# Patient Record
Sex: Male | Born: 1970 | Race: White | Hispanic: No | Marital: Married | Smoking: Former smoker
Health system: Southern US, Community
[De-identification: ages and names within clinical notes are randomized; demographics above are authoritative.]

## PROBLEM LIST (undated history)

## (undated) DIAGNOSIS — M1990 Unspecified osteoarthritis, unspecified site: Secondary | ICD-10-CM

## (undated) DIAGNOSIS — M542 Cervicalgia: Secondary | ICD-10-CM

## (undated) DIAGNOSIS — I10 Essential (primary) hypertension: Secondary | ICD-10-CM

## (undated) DIAGNOSIS — K219 Gastro-esophageal reflux disease without esophagitis: Secondary | ICD-10-CM

## (undated) DIAGNOSIS — E785 Hyperlipidemia, unspecified: Secondary | ICD-10-CM

## (undated) DIAGNOSIS — J449 Chronic obstructive pulmonary disease, unspecified: Secondary | ICD-10-CM

## (undated) DIAGNOSIS — E119 Type 2 diabetes mellitus without complications: Secondary | ICD-10-CM

## (undated) HISTORY — DX: Hyperlipidemia, unspecified: E78.5

## (undated) HISTORY — PX: CHOLECYSTECTOMY: SHX55

## (undated) HISTORY — DX: Type 2 diabetes mellitus without complications: E11.9

## (undated) HISTORY — PX: BACK SURGERY: SHX140

## (undated) HISTORY — PX: KNEE SURGERY: SHX244

## (undated) HISTORY — PX: SPINE SURGERY: SHX786

## (undated) HISTORY — PX: APPENDECTOMY: SHX54

## (undated) HISTORY — PX: HERNIA REPAIR: SHX51

---
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
## 1999-11-09 ENCOUNTER — Emergency Department (HOSPITAL_COMMUNITY): Admission: EM | Admit: 1999-11-09 | Discharge: 1999-11-09 | Payer: Self-pay | Admitting: Emergency Medicine

## 1999-11-09 ENCOUNTER — Encounter: Payer: Self-pay | Admitting: Emergency Medicine

## 2000-03-05 ENCOUNTER — Emergency Department (HOSPITAL_COMMUNITY): Admission: EM | Admit: 2000-03-05 | Discharge: 2000-03-06 | Payer: Self-pay | Admitting: *Deleted

## 2000-03-07 ENCOUNTER — Ambulatory Visit (HOSPITAL_COMMUNITY): Admission: RE | Admit: 2000-03-07 | Discharge: 2000-03-07 | Payer: Self-pay | Admitting: Emergency Medicine

## 2000-03-07 ENCOUNTER — Encounter: Payer: Self-pay | Admitting: Emergency Medicine

## 2000-03-08 ENCOUNTER — Emergency Department (HOSPITAL_COMMUNITY): Admission: EM | Admit: 2000-03-08 | Discharge: 2000-03-08 | Payer: Self-pay | Admitting: Emergency Medicine

## 2000-03-09 ENCOUNTER — Observation Stay (HOSPITAL_COMMUNITY): Admission: RE | Admit: 2000-03-09 | Discharge: 2000-03-10 | Payer: Self-pay | Admitting: General Surgery

## 2000-03-22 ENCOUNTER — Ambulatory Visit (HOSPITAL_COMMUNITY): Admission: RE | Admit: 2000-03-22 | Discharge: 2000-03-22 | Payer: Self-pay | Admitting: *Deleted

## 2002-01-01 ENCOUNTER — Encounter: Payer: Self-pay | Admitting: Neurosurgery

## 2002-01-01 ENCOUNTER — Observation Stay (HOSPITAL_COMMUNITY): Admission: RE | Admit: 2002-01-01 | Discharge: 2002-01-02 | Payer: Self-pay | Admitting: Neurosurgery

## 2002-04-23 ENCOUNTER — Encounter: Admission: RE | Admit: 2002-04-23 | Discharge: 2002-06-17 | Payer: Self-pay | Admitting: Neurosurgery

## 2002-07-16 ENCOUNTER — Encounter: Payer: Self-pay | Admitting: Emergency Medicine

## 2002-07-16 ENCOUNTER — Emergency Department (HOSPITAL_COMMUNITY): Admission: EM | Admit: 2002-07-16 | Discharge: 2002-07-16 | Payer: Self-pay | Admitting: Emergency Medicine

## 2002-08-13 ENCOUNTER — Encounter: Payer: Self-pay | Admitting: Neurosurgery

## 2002-08-13 ENCOUNTER — Encounter: Payer: Self-pay | Admitting: Radiology

## 2002-08-13 ENCOUNTER — Encounter: Admission: RE | Admit: 2002-08-13 | Discharge: 2002-08-13 | Payer: Self-pay | Admitting: Neurosurgery

## 2002-08-29 ENCOUNTER — Encounter: Admission: RE | Admit: 2002-08-29 | Discharge: 2002-08-29 | Payer: Self-pay | Admitting: Neurosurgery

## 2002-08-29 ENCOUNTER — Encounter: Payer: Self-pay | Admitting: Neurosurgery

## 2003-08-25 ENCOUNTER — Emergency Department (HOSPITAL_COMMUNITY): Admission: EM | Admit: 2003-08-25 | Discharge: 2003-08-25 | Payer: Self-pay | Admitting: Emergency Medicine

## 2004-04-24 ENCOUNTER — Emergency Department (HOSPITAL_COMMUNITY): Admission: EM | Admit: 2004-04-24 | Discharge: 2004-04-25 | Payer: Self-pay | Admitting: Emergency Medicine

## 2004-10-06 ENCOUNTER — Emergency Department (HOSPITAL_COMMUNITY): Admission: EM | Admit: 2004-10-06 | Discharge: 2004-10-07 | Payer: Self-pay | Admitting: Emergency Medicine

## 2005-05-01 ENCOUNTER — Emergency Department (HOSPITAL_COMMUNITY): Admission: EM | Admit: 2005-05-01 | Discharge: 2005-05-01 | Payer: Self-pay | Admitting: Emergency Medicine

## 2006-03-27 ENCOUNTER — Emergency Department (HOSPITAL_COMMUNITY): Admission: EM | Admit: 2006-03-27 | Discharge: 2006-03-27 | Payer: Self-pay | Admitting: Emergency Medicine

## 2006-10-07 ENCOUNTER — Inpatient Hospital Stay: Payer: Self-pay | Admitting: Internal Medicine

## 2006-10-07 ENCOUNTER — Other Ambulatory Visit: Payer: Self-pay

## 2006-10-14 ENCOUNTER — Other Ambulatory Visit: Payer: Self-pay

## 2006-12-24 ENCOUNTER — Inpatient Hospital Stay: Payer: Self-pay | Admitting: Internal Medicine

## 2006-12-24 ENCOUNTER — Other Ambulatory Visit: Payer: Self-pay

## 2007-01-01 ENCOUNTER — Ambulatory Visit: Payer: Self-pay | Admitting: Internal Medicine

## 2007-02-09 ENCOUNTER — Other Ambulatory Visit: Payer: Self-pay

## 2007-02-09 ENCOUNTER — Inpatient Hospital Stay: Payer: Self-pay | Admitting: Internal Medicine

## 2009-07-09 ENCOUNTER — Emergency Department: Payer: Self-pay | Admitting: Emergency Medicine

## 2012-03-30 ENCOUNTER — Ambulatory Visit: Payer: Self-pay | Admitting: Internal Medicine

## 2013-02-11 ENCOUNTER — Emergency Department: Payer: Self-pay | Admitting: Emergency Medicine

## 2013-02-11 LAB — CBC WITH DIFFERENTIAL/PLATELET
BASOS PCT: 1 %
Basophil #: 0.1 10*3/uL (ref 0.0–0.1)
EOS ABS: 0.3 10*3/uL (ref 0.0–0.7)
EOS PCT: 1.9 %
HCT: 45.2 % (ref 40.0–52.0)
HGB: 15.9 g/dL (ref 13.0–18.0)
LYMPHS PCT: 22.3 %
Lymphocyte #: 3 10*3/uL (ref 1.0–3.6)
MCH: 32 pg (ref 26.0–34.0)
MCHC: 35.2 g/dL (ref 32.0–36.0)
MCV: 91 fL (ref 80–100)
Monocyte #: 1 x10 3/mm (ref 0.2–1.0)
Monocyte %: 7.7 %
NEUTROS ABS: 9.1 10*3/uL — AB (ref 1.4–6.5)
NEUTROS PCT: 67.1 %
PLATELETS: 383 10*3/uL (ref 150–440)
RBC: 4.97 10*6/uL (ref 4.40–5.90)
RDW: 13.6 % (ref 11.5–14.5)
WBC: 13.6 10*3/uL — ABNORMAL HIGH (ref 3.8–10.6)

## 2013-02-11 LAB — COMPREHENSIVE METABOLIC PANEL
ALK PHOS: 66 U/L
AST: 46 U/L — AB (ref 15–37)
Albumin: 3.3 g/dL — ABNORMAL LOW (ref 3.4–5.0)
Anion Gap: 6 — ABNORMAL LOW (ref 7–16)
BILIRUBIN TOTAL: 0.4 mg/dL (ref 0.2–1.0)
BUN: 11 mg/dL (ref 7–18)
CALCIUM: 8.1 mg/dL — AB (ref 8.5–10.1)
CHLORIDE: 102 mmol/L (ref 98–107)
CREATININE: 0.82 mg/dL (ref 0.60–1.30)
Co2: 24 mmol/L (ref 21–32)
EGFR (Non-African Amer.): >60
GLUCOSE: 154 mg/dL — AB (ref 65–99)
Osmolality: 267 (ref 275–301)
Potassium: 3.9 mmol/L (ref 3.5–5.1)
SGPT (ALT): 51 U/L (ref 12–78)
Sodium: 132 mmol/L — ABNORMAL LOW (ref 136–145)
Total Protein: 6.9 g/dL (ref 6.4–8.2)

## 2013-02-13 LAB — BETA STREP CULTURE(ARMC)

## 2014-03-12 ENCOUNTER — Ambulatory Visit: Payer: Self-pay | Admitting: Family Medicine

## 2014-05-30 NOTE — Discharge Instructions (Signed)

## 2014-06-02 ENCOUNTER — Encounter
Admission: RE | Disposition: A | Discharge status: Home / Self Care | Payer: Self-pay | Source: Ambulatory Visit | Attending: Gastroenterology

## 2014-06-02 ENCOUNTER — Ambulatory Visit: Payer: 59 | Admitting: Anesthesiology

## 2014-06-02 ENCOUNTER — Other Ambulatory Visit: Payer: Self-pay | Admitting: Gastroenterology

## 2014-06-02 ENCOUNTER — Ambulatory Visit
Admission: RE | Admit: 2014-06-02 | Discharge: 2014-06-02 | Disposition: A | Discharge status: Home / Self Care | Payer: 59 | Source: Ambulatory Visit | Attending: Gastroenterology | Admitting: Gastroenterology

## 2014-06-02 DIAGNOSIS — M179 Osteoarthritis of knee, unspecified: Secondary | ICD-10-CM | POA: Insufficient documentation

## 2014-06-02 DIAGNOSIS — F17200 Nicotine dependence, unspecified, uncomplicated: Secondary | ICD-10-CM | POA: Insufficient documentation

## 2014-06-02 DIAGNOSIS — K219 Gastro-esophageal reflux disease without esophagitis: Secondary | ICD-10-CM | POA: Insufficient documentation

## 2014-06-02 DIAGNOSIS — Z86010 Personal history of colonic polyps: Secondary | ICD-10-CM | POA: Diagnosis present

## 2014-06-02 DIAGNOSIS — J449 Chronic obstructive pulmonary disease, unspecified: Secondary | ICD-10-CM | POA: Insufficient documentation

## 2014-06-02 DIAGNOSIS — M479 Spondylosis, unspecified: Secondary | ICD-10-CM | POA: Insufficient documentation

## 2014-06-02 DIAGNOSIS — D127 Benign neoplasm of rectosigmoid junction: Secondary | ICD-10-CM | POA: Insufficient documentation

## 2014-06-02 DIAGNOSIS — Z79899 Other long term (current) drug therapy: Secondary | ICD-10-CM | POA: Insufficient documentation

## 2014-06-02 DIAGNOSIS — Z800 Family history of malignant neoplasm of digestive organs: Secondary | ICD-10-CM | POA: Diagnosis not present

## 2014-06-02 HISTORY — DX: Gastro-esophageal reflux disease without esophagitis: K21.9

## 2014-06-02 HISTORY — PX: POLYPECTOMY: SHX149

## 2014-06-02 HISTORY — DX: Unspecified osteoarthritis, unspecified site: M19.90

## 2014-06-02 HISTORY — PX: COLONOSCOPY: SHX5424

## 2014-06-02 HISTORY — DX: Cervicalgia: M54.2

## 2014-06-02 HISTORY — DX: Chronic obstructive pulmonary disease, unspecified: J44.9

## 2014-06-02 SURGERY — COLONOSCOPY
Anesthesia: Monitor Anesthesia Care | Wound class: Contaminated

## 2014-06-02 MED ORDER — ACETAMINOPHEN 160 MG/5ML PO SOLN: 325.0000 mg | ORAL | Status: DC | PRN

## 2014-06-02 MED ORDER — ACETAMINOPHEN 325 MG PO TABS: 325.0000 mg | ORAL_TABLET | ORAL | Status: DC | PRN

## 2014-06-02 MED ORDER — LACTATED RINGERS IV SOLN
INTRAVENOUS | Status: DC
  Administered 2014-06-02 (×2): via INTRAVENOUS

## 2014-06-02 MED ORDER — PROPOFOL 10 MG/ML IV BOLUS
INTRAVENOUS | Status: DC | PRN
  Administered 2014-06-02: 20 mg via INTRAVENOUS
  Administered 2014-06-02: 30 mg via INTRAVENOUS
  Administered 2014-06-02: 170 mg via INTRAVENOUS
  Administered 2014-06-02: 20 mg via INTRAVENOUS
  Administered 2014-06-02: 30 mg via INTRAVENOUS
  Administered 2014-06-02: 20 mg via INTRAVENOUS
  Administered 2014-06-02: 30 mg via INTRAVENOUS

## 2014-06-02 MED ORDER — ONDANSETRON HCL 4 MG/2ML IJ SOLN: 4.0000 mg | Freq: Once | INTRAMUSCULAR | Status: DC | PRN

## 2014-06-02 MED ORDER — LIDOCAINE HCL (CARDIAC) 20 MG/ML IV SOLN
INTRAVENOUS | Status: DC | PRN
  Administered 2014-06-02: 50 mg via INTRAVENOUS

## 2014-06-02 MED ORDER — SIMETHICONE 40 MG/0.6ML PO SUSP
ORAL | Status: DC | PRN
  Administered 2014-06-02: 12:00:00

## 2014-06-02 SURGICAL SUPPLY — 27 items
CANISTER SUCT 1200ML W/VALVE (MISCELLANEOUS) ×4 IMPLANT
FCP ESCP3.2XJMB 240X2.8X (MISCELLANEOUS)
FORCEPS BIOP RAD 4 LRG CAP 4 (CUTTING FORCEPS) ×2 IMPLANT
FORCEPS BIOP RJ4 240 W/NDL (MISCELLANEOUS)
FORCEPS ESCP3.2XJMB 240X2.8X (MISCELLANEOUS) IMPLANT
GOWN CVR UNV OPN BCK APRN NK (MISCELLANEOUS) ×4 IMPLANT
GOWN ISOL THUMB LOOP REG UNIV (MISCELLANEOUS) ×8
HEMOCLIP INSTINCT (CLIP) IMPLANT
INJECTOR VARIJECT VIN23 (MISCELLANEOUS) IMPLANT
KIT CO2 TUBING (TUBING) ×4 IMPLANT
KIT DEFENDO VALVE AND CONN (KITS) IMPLANT
KIT ENDO PROCEDURE OLY (KITS) ×4 IMPLANT
LIGATOR MULTIBAND 6SHOOTER MBL (MISCELLANEOUS) IMPLANT
MARKER SPOT ENDO TATTOO 5ML (MISCELLANEOUS) IMPLANT
PAD GROUND ADULT SPLIT (MISCELLANEOUS) IMPLANT
SNARE SHORT THROW 13M SML OVAL (MISCELLANEOUS) IMPLANT
SNARE SHORT THROW 30M LRG OVAL (MISCELLANEOUS) IMPLANT
SPOT EX ENDOSCOPIC TATTOO (MISCELLANEOUS)
SUCTION POLY TRAP 4CHAMBER (MISCELLANEOUS) IMPLANT
TRAP SUCTION POLY (MISCELLANEOUS) IMPLANT
TUBING CONN 6MMX3.1M (TUBING)
TUBING SUCTION CONN 0.25 STRL (TUBING) IMPLANT
UNDERPAD 30X60 958B10 (PK) (MISCELLANEOUS) IMPLANT
VALVE BIOPSY ENDO (VALVE) IMPLANT
VARIJECT INJECTOR VIN23 (MISCELLANEOUS)
WATER AUXILLARY (MISCELLANEOUS) IMPLANT
WATER STERILE IRR 500ML POUR (IV SOLUTION) ×4 IMPLANT

## 2014-06-02 NOTE — Anesthesia Postprocedure Evaluation (Signed)
  Anesthesia Post-op Note  Patient: Tony Cardenas  Procedure(s) Performed: Procedure(s): COLONOSCOPY (N/A) POLYPECTOMY INTESTINAL  Anesthesia type:MAC  Patient location: PACU  Post pain: Pain level controlled  Post assessment: Post-op Vital signs reviewed, Patient's Cardiovascular Status Stable, Respiratory Function Stable, Patent Airway and No signs of Nausea or vomiting  Post vital signs: Reviewed and stable  Last Vitals:  Filed Vitals:   06/02/14 1200  BP:   Pulse:   Temp: 36.3 C  Resp:     Level of consciousness: awake, alert  and patient cooperative  Complications: No apparent anesthesia complications

## 2014-06-02 NOTE — Anesthesia Procedure Notes (Signed)
Procedure Name: MAC Performed by: Alysha Doolan Pre-anesthesia Checklist: Patient identified, Emergency Drugs available, Suction available, Patient being monitored and Timeout performed Patient Re-evaluated:Patient Re-evaluated prior to inductionOxygen Delivery Method: Nasal cannula Placement Confirmation: positive ETCO2 and breath sounds checked- equal and bilateral     

## 2014-06-02 NOTE — Anesthesia Preprocedure Evaluation (Signed)
Anesthesia Evaluation  Patient identified by MRN, date of birth, ID band Patient awake    Reviewed: Allergy & Precautions, NPO status , Patient's Chart, lab work & pertinent test results  Airway Mallampati: II  TM Distance: >3 FB Neck ROM: Full    Dental   Pulmonary COPDCurrent Smoker,    Pulmonary exam normal       Cardiovascular Normal cardiovascular exam    Neuro/Psych    GI/Hepatic GERD-  ,  Endo/Other    Renal/GU      Musculoskeletal  (+) Arthritis -,   Abdominal   Peds  Hematology   Anesthesia Other Findings   Reproductive/Obstetrics                             Anesthesia Physical Anesthesia Plan  ASA: III  Anesthesia Plan: MAC   Post-op Pain Management:    Induction:   Airway Management Planned: Nasal Cannula  Additional Equipment:   Intra-op Plan:   Post-operative Plan:   Informed Consent: I have reviewed the patients History and Physical, chart, labs and discussed the procedure including the risks, benefits and alternatives for the proposed anesthesia with the patient or authorized representative who has indicated his/her understanding and acceptance.     Plan Discussed with: CRNA  Anesthesia Plan Comments:         Anesthesia Quick Evaluation

## 2014-06-02 NOTE — H&P (Signed)
  Tony Cardenas  [address] [place],  [address] Phone: [phone] Fax : [phone]  Primary Care Physician:  Tony Guise, Tony Cardenas Primary Gastroenterologist:  Tony Cardenas  Pre-Procedure History & Physical: HPI:  Tony Cardenas is a 44 y.o. male is here for an colonoscopy.   Past Medical History  Diagnosis Date  . Neck pain 1989    BROKEN NECK IN PAST/C1-2/ [employer]  . COPD (chronic obstructive pulmonary disease)   . GERD (gastroesophageal reflux disease)   . Arthritis     NECK AND RIGHT KNEE    Past Surgical History  Procedure Laterality Date  . Neck surgery      C4-5 RUPTURED DISC  . Knee surgery Right     TORN ACL    Prior to Admission medications   Medication Sig Start Date End Date Taking? Authorizing Provider  fluticasone (FLONASE) 50 MCG/ACT nasal spray Place 2 sprays into both nostrils daily. PM   Yes Historical Provider, Tony Cardenas  meloxicam (MOBIC) 15 MG tablet Take 15 mg by mouth daily. AM   Yes Historical Provider, Tony Cardenas  omeprazole (PRILOSEC) 40 MG capsule Take 40 mg by mouth as needed. PM   Yes Historical Provider, Tony Cardenas    Allergies as of 05/19/2014  . (Not on File)    History reviewed. No pertinent family history.  History   Social History  . Marital Status: Married    Spouse Name: N/A  . Number of Children: N/A  . Years of Education: N/A   Occupational History  . Not on file.   Social History Main Topics  . Smoking status: Current Every Day Smoker -- 1.00 packs/day for 20 years    Types: Cigarettes  . Smokeless tobacco: Not on file  . Alcohol Use: No  . Drug Use: No  . Sexual Activity: Not on file   Other Topics Concern  . Not on file   Social History Narrative  . No narrative on file    Review of Systems: See HPI, otherwise negative ROS  Physical Exam: BP 135/84 mmHg  Pulse 73  Temp(Src) 97.9 F (36.6 C) (Tympanic)  Resp 16  Ht 5\' 11"  (1.803 m)  Wt 275 lb (124.739 kg)  BMI 38.37 kg/m2  SpO2  97% General:   Alert,  pleasant and cooperative in NAD Head:  Normocephalic and atraumatic. Neck:  Supple; no masses or thyromegaly. Lungs:  Clear throughout to auscultation.    Heart:  Regular rate and rhythm. Abdomen:  Soft, nontender and nondistended. Normal bowel sounds, without guarding, and without rebound.   Neurologic:  Alert and  oriented x4;  grossly normal neurologically.  Impression/Plan: Tony Cardenas is here for an colonoscopy to be performed for History of polyps and family history of colon cancer.   Risks, benefits, limitations, and alternatives regarding  colonoscopy have been reviewed with the patient.  Questions have been answered.  All parties agreeable.   [facility], Tony Cardenas  06/02/2014, 11:24 AM

## 2014-06-02 NOTE — Op Note (Signed)
[facility] Patient Name: Tony Cardenas Procedure Date: 06/02/2014 11:26 AM MRN: [id number] Account #: [id number] Date of Birth: Nov 11, 1970 Admit Type: Outpatient Age: 44 Room: [facility] OR ROOM 01 Gender: Male Note Status: Finalized Procedure:         Colonoscopy Indications:       Family history of colon cancer in a first-degree relative,                     Personal history of colonic polyps Providers:         Lucilla Lame, MD Referring MD:      Valerie Roys (Referring MD) Medicines:         Propofol per Anesthesia Complications:     No immediate complications. Procedure:         Pre-Anesthesia Assessment:                    - Prior to the procedure, a History and Physical was                     performed, and patient medications and allergies were                     reviewed. The patient's tolerance of previous anesthesia                     was also reviewed. The risks and benefits of the procedure                     and the sedation options and risks were discussed with the                     patient. All questions were answered, and informed consent                     was obtained. Prior Anticoagulants: The patient has taken                     no previous anticoagulant or antiplatelet agents. ASA                     Grade Assessment: II - A patient with mild systemic                     disease. After reviewing the risks and benefits, the                     patient was deemed in satisfactory condition to undergo                     the procedure.                    After obtaining informed consent, the colonoscope was                     passed under direct vision. Throughout the procedure, the                     patient's blood pressure, pulse, and oxygen saturations                     were monitored continuously. The Shelby  colonoscope (S#: [id number]) was introduced through the anus       and advanced to the the cecum, identified by appendiceal                     orifice and ileocecal valve. The colonoscopy was performed                     without difficulty. The patient tolerated the procedure                     well. The quality of the bowel preparation was excellent. Findings:      The perianal and digital rectal examinations were normal.      Five sessile polyps were found in the recto-sigmoid colon. The polyps       were 2 to 4 mm in size. These polyps were removed with a cold biopsy       forceps. Resection and retrieval were complete. Impression:        - Five 2 to 4 mm polyps at the recto-sigmoid colon.                     Resected and retrieved. Recommendation:    - Await pathology results.                    - Repeat colonoscopy in 5 years for surveillance. Procedure Code(s): --- Professional ---                    [phone], Colonoscopy, flexible; with biopsy, single or                     multiple Diagnosis Code(s): --- Professional ---                    Z86.010, Personal history of colonic polyps                    Z80.0, Family history of malignant neoplasm of digestive                     organs                    D12.7, Benign neoplasm of rectosigmoid junction CPT copyright 2014 American Medical Association. All rights reserved. The codes documented in this report are preliminary and upon coder review may  be revised to meet current compliance requirements. Lucilla Lame, MD 06/02/2014 11:47:36 AM This report has been signed electronically. Number of Addenda: 0 Note Initiated On: 06/02/2014 11:26 AM Scope Withdrawal Time: 0 hours 7 minutes 34 seconds  Total Procedure Duration: 0 hours 10 minutes 5 seconds       [facility]

## 2014-06-02 NOTE — Transfer of Care (Signed)
Immediate Anesthesia Transfer of Care Note  Patient: Tony Cardenas  Procedure(s) Performed: Procedure(s): COLONOSCOPY (N/A) POLYPECTOMY INTESTINAL  Patient Location: PACU  Anesthesia Type: MAC  Level of Consciousness: awake, alert  and patient cooperative  Airway and Oxygen Therapy: Patient Spontanous Breathing and Patient connected to supplemental oxygen  Post-op Assessment: Post-op Vital signs reviewed, Patient's Cardiovascular Status Stable, Respiratory Function Stable, Patent Airway and No signs of Nausea or vomiting  Post-op Vital Signs: Reviewed and stable  Complications: No apparent anesthesia complications

## 2014-06-03 ENCOUNTER — Encounter: Payer: Self-pay | Admitting: Gastroenterology

## 2014-07-10 ENCOUNTER — Telehealth: Payer: Self-pay | Admitting: Family Medicine

## 2014-07-10 MED ORDER — MELOXICAM 15 MG PO TABS: 15.0000 mg | ORAL_TABLET | Freq: Every day | ORAL | Status: DC

## 2014-07-10 NOTE — Telephone Encounter (Signed)
E-Fax came through for refill: Rx: meloxicam (MOBIC) 15 MG tablet Copy in basket

## 2014-12-26 ENCOUNTER — Encounter: Payer: Self-pay | Admitting: Family Medicine

## 2014-12-26 ENCOUNTER — Ambulatory Visit (INDEPENDENT_AMBULATORY_CARE_PROVIDER_SITE_OTHER): Payer: 59 | Admitting: Family Medicine

## 2014-12-26 VITALS — BP 135/79 | HR 85 | Temp 98.1°F | Ht 71.0 in | Wt 268.6 lb

## 2014-12-26 DIAGNOSIS — M1990 Unspecified osteoarthritis, unspecified site: Secondary | ICD-10-CM | POA: Diagnosis not present

## 2014-12-26 DIAGNOSIS — K219 Gastro-esophageal reflux disease without esophagitis: Secondary | ICD-10-CM | POA: Insufficient documentation

## 2014-12-26 DIAGNOSIS — N200 Calculus of kidney: Secondary | ICD-10-CM | POA: Insufficient documentation

## 2014-12-26 DIAGNOSIS — J449 Chronic obstructive pulmonary disease, unspecified: Secondary | ICD-10-CM | POA: Insufficient documentation

## 2014-12-26 MED ORDER — PREDNISONE 10 MG PO TABS: ORAL_TABLET | ORAL | Status: DC

## 2014-12-26 NOTE — Assessment & Plan Note (Signed)
In exacerbation. Will start prednisone taper. Continue to monitor. Call if not getting better or getting worse.

## 2014-12-26 NOTE — Progress Notes (Signed)
BP 135/79 mmHg  Pulse 85  Temp(Src) 98.1 F (36.7 C)  Ht 5' 11"  (1.803 m)  Wt 268 lb 9.6 oz (121.836 kg)  BMI 37.48 kg/m2  SpO2 95%   Subjective:    Patient ID: Tony Cardenas, male    DOB: 04-19-1970, 44 y.o.   MRN: [id number]  HPI: Tony Cardenas is a 44 y.o. male  Chief Complaint  Patient presents with  . Arthritis    Arthritis medicine works, but patients states these cold spells make his arthritis build up.    NECK PAIN FOLLOW UP Diagnosis: arthritis Status: worse with the cold it's been acting up a lot Treatments attempted: rest, ice, heat, APAP, ibuprofen and aleve  Compliant with recommended treatment: average Relief with NSAIDs?:  moderate Location:both shoulders and neck both side Duration:chronic, worse in the past couple of days with the cold weather Severity: moderate Quality: aching Frequency: constant Radiation: headache Aggravating factors: cold and prolonged sitting Alleviating factors: heat and NSAIDs Weakness:  no Paresthesias / decreased sensation:  no  Fevers:  no  Relevant past medical, surgical, family and social history reviewed and updated as indicated. Interim medical history since our last visit reviewed. Allergies and medications reviewed and updated.  Review of Systems  Constitutional: Negative.   Respiratory: Negative.   Cardiovascular: Negative.   Gastrointestinal: Negative.   Musculoskeletal: Positive for myalgias, arthralgias, neck pain and neck stiffness. Negative for back pain, joint swelling and gait problem.  Neurological: Negative.   Psychiatric/Behavioral: Negative.     Per HPI unless specifically indicated above     Objective:    BP 135/79 mmHg  Pulse 85  Temp(Src) 98.1 F (36.7 C)  Ht 5' 11"  (1.803 m)  Wt 268 lb 9.6 oz (121.836 kg)  BMI 37.48 kg/m2  SpO2 95%  Wt Readings from Last 3 Encounters:  12/26/14 268 lb 9.6 oz (121.836 kg)  03/12/14 269 lb (122.018 kg)  06/02/14 275 lb (124.739 kg)     Physical Exam  Constitutional: He is oriented to person, place, and time. He appears well-developed and well-nourished. No distress.  HENT:  Head: Normocephalic and atraumatic.  Right Ear: Hearing normal.  Left Ear: Hearing normal.  Nose: Nose normal.  Eyes: Conjunctivae and lids are normal. Right eye exhibits no discharge. Left eye exhibits no discharge. No scleral icterus.  Pulmonary/Chest: Effort normal. No respiratory distress.  Neurological: He is alert and oriented to person, place, and time.  Skin: Skin is warm, dry and intact. No rash noted. No erythema. No pallor.  Psychiatric: He has a normal mood and affect. His speech is normal and behavior is normal. Judgment and thought content normal. Cognition and memory are normal.  Nursing note and vitals reviewed. Neck Exam:    Tenderness to Palpation: yes    Midline cervical spine: no    Paraspinal neck musculature: yes    Trapezius: no    Sternocleidomastoid: no     Range of Motion:     Flexion: Decreased    Extension: Decreased    Lateral rotation: Decreased    Lateral bending: Decreased     Neuro Examination: Upper extremity DTRs normal & symmetric.  Strength and sensation intact.       Special Tests:     Spurling test: negative   Results for orders placed or performed in visit on 02/11/13  Beta Strep Culture [facility])  Result Value Ref Range   Micro Text Report         SOURCE: THROAT  ORGANISM 1                MODERATE GROWTH STREPTOCOCCUS AGALACTIAE (GROUP B)   COMMENT                   -   ANTIBIOTIC                                                      CBC with Differential/Platelet  Result Value Ref Range   WBC 13.6 (H) 3.8-10.6 x10 3/mm 3   RBC 4.97 4.40-5.90 x10 6/mm 3   HGB 15.9 13.0-18.0 g/dL   HCT 45.2 40.0-52.0 %   MCV 91 80-100 fL   MCH 32.0 26.0-34.0 pg   MCHC 35.2 32.0-36.0 g/dL   RDW 13.6 11.5-14.5 %   Platelet 383 150-440 x10 3/mm 3   Neutrophil % 67.1 %   Lymphocyte % 22.3 %   Monocyte %  7.7 %   Eosinophil % 1.9 %   Basophil % 1.0 %   Neutrophil # 9.1 (H) 1.4-6.5 x10 3/mm 3   Lymphocyte # 3.0 1.0-3.6 x10 3/mm 3   Monocyte # 1.0 0.2-1.0 x10 3/mm    Eosinophil # 0.3 0.0-0.7 x10 3/mm 3   Basophil # 0.1 0.0-0.1 x10 3/mm 3  Comprehensive metabolic panel  Result Value Ref Range   Glucose 154 (H) 65-99 mg/dL   BUN 11 7-18 mg/dL   Creatinine 0.82 0.60-1.30 mg/dL   Sodium 132 (L) 136-145 mmol/L   Potassium 3.9 3.5-5.1 mmol/L   Chloride 102 98-107 mmol/L   Co2 24 21-32 mmol/L   Calcium, Total 8.1 (L) 8.5-10.1 mg/dL   SGOT(AST) 46 (H) 15-37 Unit/L   SGPT (ALT) 51 12-78 U/L   Alkaline Phosphatase 66 Unit/L   Albumin 3.3 (L) 3.4-5.0 g/dL   Total Protein 6.9 6.4-8.2 g/dL   Bilirubin,Total 0.4 0.2-1.0 mg/dL   Osmolality 267 275-301   Anion Gap 6 (L) 7-16   EGFR (African American) >60    EGFR (Non-African Amer.) >60       Assessment & Plan:   Problem List Items Addressed This Visit      Musculoskeletal and Integument   Arthritis - Primary    In exacerbation. Will start prednisone taper. Continue to monitor. Call if not getting better or getting worse.       Relevant Medications   ibuprofen (ADVIL,MOTRIN) 200 MG tablet   predniSONE (DELTASONE) 10 MG tablet       Follow up plan: Return in about 3 months (around 03/26/2015) for PE.

## 2015-01-23 ENCOUNTER — Telehealth: Payer: Self-pay | Admitting: Family Medicine

## 2015-01-23 MED ORDER — MELOXICAM 15 MG PO TABS: 15.0000 mg | ORAL_TABLET | Freq: Every day | ORAL | Status: DC

## 2015-01-23 NOTE — Telephone Encounter (Signed)
Rx sent to his pharmacy

## 2015-01-23 NOTE — Telephone Encounter (Signed)
Forward to provider

## 2015-01-23 NOTE — Telephone Encounter (Signed)
Meloxicam sent to [place], this is a pharmacy change.

## 2015-01-26 ENCOUNTER — Encounter: Payer: Self-pay | Admitting: Family Medicine

## 2015-01-26 ENCOUNTER — Ambulatory Visit (INDEPENDENT_AMBULATORY_CARE_PROVIDER_SITE_OTHER): Payer: 59 | Admitting: Family Medicine

## 2015-01-26 VITALS — BP 148/78 | HR 94 | Temp 98.5°F | Ht 70.2 in | Wt 271.0 lb

## 2015-01-26 DIAGNOSIS — R739 Hyperglycemia, unspecified: Secondary | ICD-10-CM

## 2015-01-26 DIAGNOSIS — Z13220 Encounter for screening for lipoid disorders: Secondary | ICD-10-CM

## 2015-01-26 DIAGNOSIS — R079 Chest pain, unspecified: Secondary | ICD-10-CM | POA: Diagnosis not present

## 2015-01-26 DIAGNOSIS — J069 Acute upper respiratory infection, unspecified: Secondary | ICD-10-CM | POA: Diagnosis not present

## 2015-01-26 DIAGNOSIS — E1165 Type 2 diabetes mellitus with hyperglycemia: Secondary | ICD-10-CM | POA: Diagnosis not present

## 2015-01-26 DIAGNOSIS — Z23 Encounter for immunization: Secondary | ICD-10-CM

## 2015-01-26 MED ORDER — FLUTICASONE PROPIONATE 50 MCG/ACT NA SUSP: 2.0000 | Freq: Every day | NASAL | Status: DC

## 2015-01-26 MED ORDER — METFORMIN HCL ER 500 MG PO TB24: 500.0000 mg | ORAL_TABLET | Freq: Two times a day (BID) | ORAL | Status: DC

## 2015-01-26 MED ORDER — BLOOD GLUCOSE MONITOR KIT: PACK | Status: DC

## 2015-01-26 MED ORDER — LORATADINE 10 MG PO TABS: 10.0000 mg | ORAL_TABLET | Freq: Every day | ORAL | Status: DC | PRN

## 2015-01-26 MED ORDER — MELOXICAM 15 MG PO TABS: 15.0000 mg | ORAL_TABLET | Freq: Every day | ORAL | Status: DC

## 2015-01-26 MED ORDER — OMEPRAZOLE 40 MG PO CPDR: 40.0000 mg | DELAYED_RELEASE_CAPSULE | ORAL | Status: DC | PRN

## 2015-01-26 NOTE — Assessment & Plan Note (Signed)
Newly diagnosed. Will start metformin and check labs. Referral to eye doctor and lifestyle center made today. Foot exam normal. Shots given today. Information given. Continue to monitor and recheck in 1 month.

## 2015-01-26 NOTE — Progress Notes (Signed)
BP 148/78 mmHg  Pulse 94  Temp(Src) 98.5 F (36.9 C)  Ht 5' 10.2" (1.783 m)  Wt 271 lb (122.925 kg)  BMI 38.67 kg/m2  SpO2 98%   Subjective:    Patient ID: Tony Cardenas, male    DOB: 02/20/1970, 45 y.o.   MRN: [id number]  HPI: Tony Cardenas is a 45 y.o. male  Chief Complaint  Patient presents with  . Cough    Patient complains of chest and back pain   UPPER RESPIRATORY TRACT INFECTION Duration: 3 days Worst symptom: cough and chest pain Fever: yes Cough: yes Shortness of breath: yes Wheezing: yes Chest pain: yes Chest tightness: yes Chest congestion: yes Nasal congestion: yes Runny nose: no Post nasal drip: yes Sneezing: no Sore throat: yes Swollen glands: no Sinus pressure: yes Headache: yes Face pain: no Toothache: no Ear pain: yes bilateral Ear pressure: yes bilateral Eyes red/itching:no Eye drainage/crusting: no  Vomiting: no Rash: no Fatigue: yes Sick contacts: yes Strep contacts: no  Context: stable Recurrent sinusitis: no Relief with OTC cold/cough medications: no  Treatments attempted: pseudoephedrine   DIABETES- Had his sugars checked by his brother and noted that it was in the 500s, concerned that he might have diabetes.  Hypoglycemic episodes:no Polydipsia/polyuria: yes Visual disturbance: yes Chest pain: yes Paresthesias: yes- numbness on the side of his foot for about the past year Glucose Monitoring: no Taking Insulin?: no Blood Pressure Monitoring: not checking Retinal Examination: Not up to Date- will call to get appointment ASAP Foot Exam: Up to Date Diabetic Education: ordered today Pneumovax: Up to Date Influenza: Up to Date Aspirin: no   Relevant past medical, surgical, family and social history reviewed and updated as indicated. Interim medical history since our last visit reviewed. Allergies and medications reviewed and updated.  Review of Systems  Constitutional: Negative.   HENT: Negative.   Respiratory:  Positive for chest tightness, shortness of breath and wheezing. Negative for apnea, choking and stridor.   Cardiovascular: Positive for chest pain.  Musculoskeletal: Negative.   Psychiatric/Behavioral: Negative.     Per HPI unless specifically indicated above     Objective:    BP 148/78 mmHg  Pulse 94  Temp(Src) 98.5 F (36.9 C)  Ht 5' 10.2" (1.783 m)  Wt 271 lb (122.925 kg)  BMI 38.67 kg/m2  SpO2 98%  Wt Readings from Last 3 Encounters:  01/26/15 271 lb (122.925 kg)  12/26/14 268 lb 9.6 oz (121.836 kg)  03/12/14 269 lb (122.018 kg)    Physical Exam  Constitutional: He is oriented to person, place, and time. He appears well-developed and well-nourished. No distress.  HENT:  Head: Normocephalic and atraumatic.  Right Ear: Hearing, tympanic membrane, external ear and ear canal normal.  Left Ear: Hearing, tympanic membrane, external ear and ear canal normal.  Nose: Nose normal. Right sinus exhibits no maxillary sinus tenderness and no frontal sinus tenderness. Left sinus exhibits no maxillary sinus tenderness and no frontal sinus tenderness.  Mouth/Throat: Uvula is midline, oropharynx is clear and moist and mucous membranes are normal. No oropharyngeal exudate.  Eyes: Conjunctivae, EOM and lids are normal. Pupils are equal, round, and reactive to light. Right eye exhibits no discharge. Left eye exhibits no discharge. No scleral icterus.  Neck: Normal range of motion. Neck supple. No JVD present. No tracheal deviation present. No thyromegaly present.  Cardiovascular: Normal rate, regular rhythm, normal heart sounds and intact distal pulses.  Exam reveals no gallop and no friction rub.   No murmur  heard. Pulmonary/Chest: Effort normal and breath sounds normal. No stridor. No respiratory distress. He has no wheezes. He has no rales. He exhibits no tenderness.  Musculoskeletal: Normal range of motion.  Lymphadenopathy:    He has no cervical adenopathy.  Neurological: He is alert and  oriented to person, place, and time.  Skin: Skin is warm, dry and intact. No rash noted. He is not diaphoretic. No erythema. No pallor.  Psychiatric: He has a normal mood and affect. His speech is normal and behavior is normal. Judgment and thought content normal. Cognition and memory are normal.  Nursing note and vitals reviewed.       Assessment & Plan:   Problem List Items Addressed This Visit      Other   Type 2 diabetes mellitus with hyperglycemia ([place]) - Primary    Newly diagnosed. Will start metformin and check labs. Referral to eye doctor and lifestyle center made today. Foot exam normal. Shots given today. Information given. Continue to monitor and recheck in 1 month.       Relevant Medications   metFORMIN (GLUCOPHAGE XR) 500 MG 24 hr tablet   Other Relevant Orders   Referral to Nutrition and Diabetes Services   Ambulatory referral to Ophthalmology    Other Visit Diagnoses    Hyperglycemia        Will check labs to see if he's diabetic given elevated blood sugar over the holidays.     Relevant Orders    Bayer DCA Hb A1c Waived    CBC with Differential/Platelet    Comprehensive metabolic panel    Lipid Panel w/o Chol/HDL Ratio    Microalbumin, Urine Waived    TSH    UA/M w/rflx Culture, Routine    Screening for cholesterol level        Checking labs today. Await results.     Relevant Orders    Lipid Panel w/o Chol/HDL Ratio    Chest pain, unspecified chest pain type        EKG normal. Likely due to URI. Rest and fluids. Call if not getting better or getting worse.     Relevant Orders    EKG 12-Lead (Completed)    Immunization due        Flu and pneumovax given today.    Relevant Orders    Flu Vaccine QUAD 36+ mos PF IM (Fluarix & Fluzone Quad PF) (Completed)    Upper respiratory infection        Seems to be viral. Rest and fluids. Call if not getting better or getting worse.         Follow up plan: Return in about 4 weeks (around 02/23/2015) for follow up  diabetes.

## 2015-01-26 NOTE — Patient Instructions (Signed)
Diabetes Mellitus and Food It is important for you to manage your blood sugar (glucose) level. Your blood glucose level can be greatly affected by what you eat. Eating healthier foods in the appropriate amounts throughout the day at about the same time each day will help you control your blood glucose level. It can also help slow or prevent worsening of your diabetes mellitus. Healthy eating may even help you improve the level of your blood pressure and reach or maintain a healthy weight.  General recommendations for healthful eating and cooking habits include:  Eating meals and snacks regularly. Avoid going long periods of time without eating to lose weight.  Eating a diet that consists mainly of plant-based foods, such as fruits, vegetables, nuts, legumes, and whole grains.  Using low-heat cooking methods, such as baking, instead of high-heat cooking methods, such as deep frying. Work with your dietitian to make sure you understand how to use the Nutrition Facts information on food labels. HOW CAN FOOD AFFECT ME? Carbohydrates Carbohydrates affect your blood glucose level more than any other type of food. Your dietitian will help you determine how many carbohydrates to eat at each meal and teach you how to count carbohydrates. Counting carbohydrates is important to keep your blood glucose at a healthy level, especially if you are using insulin or taking certain medicines for diabetes mellitus. Alcohol Alcohol can cause sudden decreases in blood glucose (hypoglycemia), especially if you use insulin or take certain medicines for diabetes mellitus. Hypoglycemia can be a life-threatening condition. Symptoms of hypoglycemia (sleepiness, dizziness, and disorientation) are similar to symptoms of having too much alcohol.  If your health care provider has given you approval to drink alcohol, do so in moderation and use the following guidelines:  Women should not have more than one drink per day, and men  should not have more than two drinks per day. One drink is equal to:  12 oz of beer.  5 oz of wine.  1 oz of hard liquor.  Do not drink on an empty stomach.  Keep yourself hydrated. Have water, diet soda, or unsweetened iced tea.  Regular soda, juice, and other mixers might contain a lot of carbohydrates and should be counted. WHAT FOODS ARE NOT RECOMMENDED? As you make food choices, it is important to remember that all foods are not the same. Some foods have fewer nutrients per serving than other foods, even though they might have the same number of calories or carbohydrates. It is difficult to get your body what it needs when you eat foods with fewer nutrients. Examples of foods that you should avoid that are high in calories and carbohydrates but low in nutrients include:  Trans fats (most processed foods list trans fats on the Nutrition Facts label).  Regular soda.  Juice.  Candy.  Sweets, such as cake, pie, doughnuts, and cookies.  Fried foods. WHAT FOODS CAN I EAT? Eat nutrient-rich foods, which will nourish your body and keep you healthy. The food you should eat also will depend on several factors, including:  The calories you need.  The medicines you take.  Your weight.  Your blood glucose level.  Your blood pressure level.  Your cholesterol level. You should eat a variety of foods, including:  Protein.  Lean cuts of meat.  Proteins low in saturated fats, such as fish, egg whites, and beans. Avoid processed meats.  Fruits and vegetables.  Fruits and vegetables that may help control blood glucose levels, such as apples, mangoes, and  yams.  Dairy products.  Choose fat-free or low-fat dairy products, such as milk, yogurt, and cheese.  Grains, bread, pasta, and rice.  Choose whole grain products, such as multigrain bread, whole oats, and brown rice. These foods may help control blood pressure.  Fats.  Foods containing healthful fats, such as nuts,  avocado, olive oil, canola oil, and fish. DOES EVERYONE WITH DIABETES MELLITUS HAVE THE SAME MEAL PLAN? Because every person with diabetes mellitus is different, there is not one meal plan that works for everyone. It is very important that you meet with a dietitian who will help you create a meal plan that is just right for you.   This information is not intended to replace advice given to you by your health care provider. Make sure you discuss any questions you have with your health care provider.   Document Released: 09/23/2004 Document Revised: 01/17/2014 Document Reviewed: 11/23/2012 Elsevier Interactive Patient Education 2016 [place]. Diabetes and Standards of Medical Care Diabetes is complicated. You may find that your diabetes team includes a dietitian, nurse, diabetes educator, eye doctor, and more. To help everyone know what is going on and to help you get the care you deserve, the following schedule of care was developed to help keep you on track. Below are the tests, exams, vaccines, medicines, education, and plans you will need. HbA1c test This test shows how well you have controlled your glucose over the past 2-3 months. It is used to see if your diabetes management plan needs to be adjusted.   It is performed at least 2 times a year if you are meeting treatment goals.  It is performed 4 times a year if therapy has changed or if you are not meeting treatment goals. Blood pressure test  This test is performed at every routine medical visit. The goal is less than 140/90 mm Hg for most people, but 130/80 mm Hg in some cases. Ask your health care provider about your goal. Dental exam  Follow up with the dentist regularly. Eye exam  If you are diagnosed with type 1 diabetes as a child, get an exam upon reaching the age of 4 years or older and having had diabetes for 3-5 years. Yearly eye exams are recommended after that initial eye exam.  If you are diagnosed with type 1  diabetes as an adult, get an exam within 5 years of diagnosis and then yearly.  If you are diagnosed with type 2 diabetes, get an exam as soon as possible after the diagnosis and then yearly. Foot care exam  Visual foot exams are performed at every routine medical visit. The exams check for cuts, injuries, or other problems with the feet.  You should have a complete foot exam performed every year. This exam includes an inspection of the structure and skin of your feet, a check of the pulses in your feet, and a check of the sensation in your feet.  Type 1 diabetes: The first exam is performed 5 years after diagnosis.  Type 2 diabetes: The first exam is performed at the time of diagnosis.  Check your feet nightly for cuts, injuries, or other problems with your feet. Tell your health care provider if anything is not healing. Kidney function test (urine microalbumin)  This test is performed once a year.  Type 1 diabetes: The first test is performed 5 years after diagnosis.  Type 2 diabetes: The first test is performed at the time of diagnosis.  A serum creatinine and  estimated glomerular filtration rate (eGFR) test is done once a year to assess the level of chronic kidney disease (CKD), if present. Lipid profile (cholesterol, HDL, LDL, triglycerides)  Performed every 5 years for most people.  The goal for LDL is less than 100 mg/dL. If you are at high risk, the goal is less than 70 mg/dL.  The goal for HDL is 40 mg/dL-50 mg/dL for men and 50 mg/dL-60 mg/dL for women. An HDL cholesterol of 60 mg/dL or higher gives some protection against heart disease.  The goal for triglycerides is less than 150 mg/dL. Immunizations  The flu (influenza) vaccine is recommended yearly for every person 58 months of age or older who has diabetes.  The pneumonia (pneumococcal) vaccine is recommended for every person 51 years of age or older who has diabetes. Adults 29 years of age or older may receive the  pneumonia vaccine as a series of two separate shots.  The hepatitis B vaccine is recommended for adults shortly after they have been diagnosed with diabetes.  The Tdap (tetanus, diphtheria, and pertussis) vaccine should be given:  According to normal childhood vaccination schedules, for children.  Every 10 years, for adults who have diabetes. Diabetes self-management education  Education is recommended at diagnosis and ongoing as needed. Treatment plan  Your treatment plan is reviewed at every medical visit.   This information is not intended to replace advice given to you by your health care provider. Make sure you discuss any questions you have with your health care provider.   Document Released: 10/24/2008 Document Revised: 01/17/2014 Document Reviewed: 05/29/2012 Elsevier Interactive Patient Education 2016 [employer] Blood Glucose Monitoring, Adult Monitoring your blood glucose (also know as blood sugar) helps you to manage your diabetes. It also helps you and your health care provider monitor your diabetes and determine how well your treatment plan is working. WHY SHOULD YOU MONITOR YOUR BLOOD GLUCOSE?  It can help you understand how food, exercise, and medicine affect your blood glucose.  It allows you to know what your blood glucose is at any given moment. You can quickly tell if you are having low blood glucose (hypoglycemia) or high blood glucose (hyperglycemia).  It can help you and your health care provider know how to adjust your medicines.  It can help you understand how to manage an illness or adjust medicine for exercise. WHEN SHOULD YOU TEST? Your health care provider will help you decide how often you should check your blood glucose. This may depend on the type of diabetes you have, your diabetes control, or the types of medicines you are taking. Be sure to write down all of your blood glucose readings so that this information can be reviewed with your health care  provider. See below for examples of testing times that your health care provider may suggest. Type 1 Diabetes  Test at least 2 times per day if your diabetes is well controlled, if you are using an insulin pump, or if you perform multiple daily injections.  If your diabetes is not well controlled or if you are sick, you may need to test more often.  It is a good idea to also test:  Before every insulin injection.  Before and after exercise.  Between meals and 2 hours after a meal.  Occasionally between 2:00 a.m. and 3:00 a.m. Type 2 Diabetes  If you are taking insulin, test at least 2 times per day. However, it is best to test before every insulin injection.  If  you take medicines by mouth (orally), test 2 times a day.  If you are on a controlled diet, test once a day.  If your diabetes is not well controlled or if you are sick, you may need to monitor more often. HOW TO MONITOR YOUR BLOOD GLUCOSE Supplies Needed  Blood glucose meter.  Test strips for your meter. Each meter has its own strips. You must use the strips that go with your own meter.  A pricking needle (lancet).  A device that holds the lancet (lancing device).  A journal or log book to write down your results. Procedure  Wash your hands with soap and water. Alcohol is not preferred.  Prick the side of your finger (not the tip) with the lancet.  Gently milk the finger until a small drop of blood appears.  Follow the instructions that come with your meter for inserting the test strip, applying blood to the strip, and using your blood glucose meter. Other Areas to Get Blood for Testing Some meters allow you to use other areas of your body (other than your finger) to test your blood. These areas are called alternative sites. The most common alternative sites are:  The forearm.  The thigh.  The back area of the lower leg.  The palm of the hand. The blood flow in these areas is slower. Therefore, the  blood glucose values you get may be delayed, and the numbers are different from what you would get from your fingers. Do not use alternative sites if you think you are having hypoglycemia. Your reading will not be accurate. Always use a finger if you are having hypoglycemia. Also, if you cannot feel your lows (hypoglycemia unawareness), always use your fingers for your blood glucose checks. ADDITIONAL TIPS FOR GLUCOSE MONITORING  Do not reuse lancets.  Always carry your supplies with you.  All blood glucose meters have a 24-hour "hotline" number to call if you have questions or need help.  Adjust (calibrate) your blood glucose meter with a control solution after finishing a few boxes of strips. BLOOD GLUCOSE RECORD KEEPING It is a good idea to keep a daily record or log of your blood glucose readings. Most glucose meters, if not all, keep your glucose records stored in the meter. Some meters come with the ability to download your records to your home computer. Keeping a record of your blood glucose readings is especially helpful if you are wanting to look for patterns. Make notes to go along with the blood glucose readings because you might forget what happened at that exact time. Keeping good records helps you and your health care provider to work together to achieve good diabetes management.    This information is not intended to replace advice given to you by your health care provider. Make sure you discuss any questions you have with your health care provider.   Document Released: 12/30/2002 Document Revised: 01/17/2014 Document Reviewed: 05/21/2012 Elsevier Interactive Patient Education 2016 [employer] Pneumococcal Polysaccharide Vaccine: What You Need to Know 1. Why get vaccinated? Vaccination can protect older adults (and some children and younger adults) from pneumococcal disease. Pneumococcal disease is caused by bacteria that can spread from person to person through close contact.  It can cause ear infections, and it can also lead to more serious infections of the:   Lungs (pneumonia),  Blood (bacteremia), and  Covering of the brain and spinal cord (meningitis). Meningitis can cause deafness and brain damage, and it can be fatal. Anyone  can get pneumococcal disease, but children under 68 years of age, people with certain medical conditions, adults over 43 years of age, and cigarette smokers are at the highest risk. About 18,000 older adults die each year from pneumococcal disease in the [place]. Treatment of pneumococcal infections with penicillin and other drugs used to be more effective. But some strains of the disease have become resistant to these drugs. This makes prevention of the disease, through vaccination, even more important. 2. Pneumococcal polysaccharide vaccine (PPSV23) Pneumococcal polysaccharide vaccine (PPSV23) protects against 23 types of pneumococcal bacteria. It will not prevent all pneumococcal disease. PPSV23 is recommended for:  All adults 66 years of age and older,  Anyone 2 through 47 years of age with certain long-term health problems,  Anyone 2 through 47 years of age with a weakened immune system,  Adults 80 through 47 years of age who smoke cigarettes or have asthma. Most people need only one dose of PPSV. A second dose is recommended for certain high-risk groups. People 58 and older should get a dose even if they have gotten one or more doses of the vaccine before they turned 65. Your healthcare provider can give you more information about these recommendations. Most healthy adults develop protection within 2 to 3 weeks of getting the shot. 3. Some people should not get this vaccine  Anyone who has had a life-threatening allergic reaction to PPSV should not get another dose.  Anyone who has a severe allergy to any component of PPSV should not receive it. Tell your provider if you have any severe allergies.  Anyone who is  moderately or severely ill when the shot is scheduled may be asked to wait until they recover before getting the vaccine. Someone with a mild illness can usually be vaccinated.  Children less than 41 years of age should not receive this vaccine.  There is no evidence that PPSV is harmful to either a pregnant woman or to her fetus. However, as a precaution, women who need the vaccine should be vaccinated before becoming pregnant, if possible. 4. Risks of a vaccine reaction With any medicine, including vaccines, there is a chance of side effects. These are usually mild and go away on their own, but serious reactions are also possible. About half of people who get PPSV have mild side effects, such as redness or pain where the shot is given, which go away within about two days. Less than 1 out of 100 people develop a fever, muscle aches, or more severe local reactions. Problems that could happen after any vaccine:  People sometimes faint after a medical procedure, including vaccination. Sitting or lying down for about 15 minutes can help prevent fainting, and injuries caused by a fall. Tell your doctor if you feel dizzy, or have vision changes or ringing in the ears.  Some people get severe pain in the shoulder and have difficulty moving the arm where a shot was given. This happens very rarely.  Any medication can cause a severe allergic reaction. Such reactions from a vaccine are very rare, estimated at about 1 in a million doses, and would happen within a few minutes to a few hours after the vaccination. As with any medicine, there is a very remote chance of a vaccine causing a serious injury or death. The safety of vaccines is always being monitored. For more information, visit: http://www.aguilar.org/ 5. What if there is a serious reaction? What should I look for? Look for anything that concerns you, such  as signs of a severe allergic reaction, very high fever, or unusual behavior.  Signs of a  severe allergic reaction can include hives, swelling of the face and throat, difficulty breathing, a fast heartbeat, dizziness, and weakness. These would usually start a few minutes to a few hours after the vaccination. What should I do? If you think it is a severe allergic reaction or other emergency that can't wait, call 9-1-1 or get to the nearest hospital. Otherwise, call your doctor. Afterward, the reaction should be reported to the Vaccine Adverse Event Reporting System (VAERS). Your doctor might file this report, or you can do it yourself through the VAERS web site at www.vaers.SamedayNews.es, or by calling 548-541-9760.  VAERS does not give medical advice. 6. How can I learn more?  Ask your doctor. He or she can give you the vaccine package insert or suggest other sources of information.  Call your local or state health department.  Contact the Centers for Disease Control and Prevention (CDC):  Call 601-112-1963 (1-800-CDC-INFO) or  Visit CDC's website at http://hunter.com/ CDC Pneumococcal Polysaccharide Vaccine VIS (05/03/13)   This information is not intended to replace advice given to you by your health care provider. Make sure you discuss any questions you have with your health care provider.   Document Released: 10/24/2005 Document Revised: 01/17/2014 Document Reviewed: 05/06/2013 Elsevier Interactive Patient Education 2016 [employer] Influenza (Flu) Vaccine (Inactivated or Recombinant):  1. Why get vaccinated? Influenza ("flu") is a contagious disease that spreads around the [place] every year, usually between October and May. Flu is caused by influenza viruses, and is spread mainly by coughing, sneezing, and close contact. Anyone can get flu. Flu strikes suddenly and can last several days. Symptoms vary by age, but can include:  fever/chills  sore throat  muscle aches  fatigue  cough  headache  runny or stuffy nose Flu can also lead to pneumonia and  blood infections, and cause diarrhea and seizures in children. If you have a medical condition, such as heart or lung disease, flu can make it worse. Flu is more dangerous for some people. Infants and young children, people 32 years of age and older, pregnant women, and people with certain health conditions or a weakened immune system are at greatest risk. Each year thousands of people in the [place] die from flu, and many more are hospitalized. Flu vaccine can:  keep you from getting flu,  make flu less severe if you do get it, and  keep you from spreading flu to your family and other people. 2. Inactivated and recombinant flu vaccines A dose of flu vaccine is recommended every flu season. Children 6 months through 56 years of age may need two doses during the same flu season. Everyone else needs only one dose each flu season. Some inactivated flu vaccines contain a very small amount of a mercury-based preservative called thimerosal. Studies have not shown thimerosal in vaccines to be harmful, but flu vaccines that do not contain thimerosal are available. There is no live flu virus in flu shots. They cannot cause the flu. There are many flu viruses, and they are always changing. Each year a new flu vaccine is made to protect against three or four viruses that are likely to cause disease in the upcoming flu season. But even when the vaccine doesn't exactly match these viruses, it may still provide some protection. Flu vaccine cannot prevent:  flu that is caused by a virus not covered by the vaccine, or  illnesses that look like flu but are not. It takes about 2 weeks for protection to develop after vaccination, and protection lasts through the flu season. 3. Some people should not get this vaccine Tell the person who is giving you the vaccine:  If you have any severe, life-threatening allergies. If you ever had a life-threatening allergic reaction after a dose of flu vaccine, or have a  severe allergy to any part of this vaccine, you may be advised not to get vaccinated. Most, but not all, types of flu vaccine contain a small amount of egg protein.  If you ever had Guillain-Barre Syndrome (also called GBS). Some people with a history of GBS should not get this vaccine. This should be discussed with your doctor.  If you are not feeling well. It is usually okay to get flu vaccine when you have a mild illness, but you might be asked to come back when you feel better. 4. Risks of a vaccine reaction With any medicine, including vaccines, there is a chance of reactions. These are usually mild and go away on their own, but serious reactions are also possible. Most people who get a flu shot do not have any problems with it. Minor problems following a flu shot include:  soreness, redness, or swelling where the shot was given  hoarseness  sore, red or itchy eyes  cough  fever  aches  headache  itching  fatigue If these problems occur, they usually begin soon after the shot and last 1 or 2 days. More serious problems following a flu shot can include the following:  There may be a small increased risk of Guillain-Barre Syndrome (GBS) after inactivated flu vaccine. This risk has been estimated at 1 or 2 additional cases per million people vaccinated. This is much lower than the risk of severe complications from flu, which can be prevented by flu vaccine.  Young children who get the flu shot along with pneumococcal vaccine (PCV13) and/or DTaP vaccine at the same time might be slightly more likely to have a seizure caused by fever. Ask your doctor for more information. Tell your doctor if a child who is getting flu vaccine has ever had a seizure. Problems that could happen after any injected vaccine:  People sometimes faint after a medical procedure, including vaccination. Sitting or lying down for about 15 minutes can help prevent fainting, and injuries caused by a fall. Tell  your doctor if you feel dizzy, or have vision changes or ringing in the ears.  Some people get severe pain in the shoulder and have difficulty moving the arm where a shot was given. This happens very rarely.  Any medication can cause a severe allergic reaction. Such reactions from a vaccine are very rare, estimated at about 1 in a million doses, and would happen within a few minutes to a few hours after the vaccination. As with any medicine, there is a very remote chance of a vaccine causing a serious injury or death. The safety of vaccines is always being monitored. For more information, visit: http://www.aguilar.org/ 5. What if there is a serious reaction? What should I look for?  Look for anything that concerns you, such as signs of a severe allergic reaction, very high fever, or unusual behavior. Signs of a severe allergic reaction can include hives, swelling of the face and throat, difficulty breathing, a fast heartbeat, dizziness, and weakness. These would start a few minutes to a few hours after the vaccination. What should I do?  If you think it is a severe allergic reaction or other emergency that can't wait, call 9-1-1 and get the person to the nearest hospital. Otherwise, call your doctor.  Reactions should be reported to the Vaccine Adverse Event Reporting System (VAERS). Your doctor should file this report, or you can do it yourself through the VAERS web site at www.vaers.SamedayNews.es, or by calling [phone]. VAERS does not give medical advice. 6. The National Vaccine Injury Compensation Program The [employer] Vaccine Injury Compensation Program (VICP) is a federal program that was created to compensate people who may have been injured by certain vaccines. Persons who believe they may have been injured by a vaccine can learn about the program and about filing a claim by calling 419-338-4696 or visiting the [place] website at GoldCloset.com.ee. There is a time limit to  file a claim for compensation. 7. How can I learn more?  Ask your healthcare provider. He or she can give you the vaccine package insert or suggest other sources of information.  Call your local or state health department.  Contact the Centers for Disease Control and Prevention (CDC):  Call 203-794-5992 (1-800-CDC-INFO) or  Visit CDC's website at https://gibson.com/ Vaccine Information Statement Inactivated Influenza Vaccine (08/16/2013)   This information is not intended to replace advice given to you by your health care provider. Make sure you discuss any questions you have with your health care provider.   Document Released: 10/21/2005 Document Revised: 01/17/2014 Document Reviewed: 08/19/2013 Elsevier Interactive Patient Education [employer].

## 2015-01-27 ENCOUNTER — Telehealth: Payer: Self-pay | Admitting: Family Medicine

## 2015-01-27 DIAGNOSIS — E785 Hyperlipidemia, unspecified: Secondary | ICD-10-CM

## 2015-01-27 LAB — UA/M W/RFLX CULTURE, ROUTINE
Bilirubin, UA: NEGATIVE
Ketones, UA: NEGATIVE
Leukocytes, UA: NEGATIVE
Nitrite, UA: NEGATIVE
Protein, UA: NEGATIVE
RBC, UA: NEGATIVE
Specific Gravity, UA: 1.015 (ref 1.005–1.030)
UUROB: 0.2 mg/dL (ref 0.2–1.0)
pH, UA: 6 (ref 5.0–7.5)

## 2015-01-27 LAB — COMPREHENSIVE METABOLIC PANEL
A/G RATIO: 1.9 (ref 1.1–2.5)
ALT: 35 IU/L (ref 0–44)
AST: 35 IU/L (ref 0–40)
Albumin: 4.5 g/dL (ref 3.5–5.5)
Alkaline Phosphatase: 70 IU/L (ref 39–117)
BUN/Creatinine Ratio: 12 (ref 9–20)
BUN: 8 mg/dL (ref 6–24)
CO2: 21 mmol/L (ref 18–29)
Calcium: 8.9 mg/dL (ref 8.7–10.2)
Chloride: 99 mmol/L (ref 96–106)
Creatinine, Ser: 0.65 mg/dL — ABNORMAL LOW (ref 0.76–1.27)
GFR calc non Af Amer: 117 mL/min/{1.73_m2} (ref >59)
GFR, EST AFRICAN AMERICAN: 135 mL/min/{1.73_m2} (ref >59)
Globulin, Total: 2.4 g/dL (ref 1.5–4.5)
Glucose: 149 mg/dL — ABNORMAL HIGH (ref 65–99)
POTASSIUM: 4.3 mmol/L (ref 3.5–5.2)
Sodium: 138 mmol/L (ref 134–144)
TOTAL PROTEIN: 6.9 g/dL (ref 6.0–8.5)

## 2015-01-27 LAB — CBC WITH DIFFERENTIAL/PLATELET
BASOS: 1 %
Basophils Absolute: 0.1 10*3/uL (ref 0.0–0.2)
EOS (ABSOLUTE): 0.2 10*3/uL (ref 0.0–0.4)
Eos: 2 %
Hematocrit: 44 % (ref 37.5–51.0)
Hemoglobin: 15.1 g/dL (ref 12.6–17.7)
Immature Grans (Abs): 0.1 10*3/uL (ref 0.0–0.1)
Immature Granulocytes: 1 %
LYMPHS ABS: 2.7 10*3/uL (ref 0.7–3.1)
Lymphs: 25 %
MCH: 31.2 pg (ref 26.6–33.0)
MCHC: 34.3 g/dL (ref 31.5–35.7)
MCV: 91 fL (ref 79–97)
MONOS ABS: 0.6 10*3/uL (ref 0.1–0.9)
Monocytes: 6 %
NEUTROS ABS: 7.1 10*3/uL — AB (ref 1.4–7.0)
Neutrophils: 65 %
PLATELETS: 409 10*3/uL — AB (ref 150–379)
RBC: 4.84 x10E6/uL (ref 4.14–5.80)
RDW: 13.2 % (ref 12.3–15.4)
WBC: 10.7 10*3/uL (ref 3.4–10.8)

## 2015-01-27 LAB — MICROALBUMIN, URINE WAIVED
Creatinine, Urine Waived: 100 mg/dL (ref 10–300)
Microalb, Ur Waived: 10 mg/L (ref 0–19)
Microalb/Creat Ratio: <30 mg/g (ref <30)

## 2015-01-27 LAB — TSH: TSH: 0.691 u[IU]/mL (ref 0.450–4.500)

## 2015-01-27 LAB — LIPID PANEL W/O CHOL/HDL RATIO
CHOLESTEROL TOTAL: 252 mg/dL — AB (ref 100–199)
HDL: 23 mg/dL — ABNORMAL LOW (ref >39)
TRIGLYCERIDES: 828 mg/dL — AB (ref 0–149)

## 2015-01-27 LAB — BAYER DCA HB A1C WAIVED: HB A1C (BAYER DCA - WAIVED): 8 % — ABNORMAL HIGH (ref <7.0)

## 2015-01-27 NOTE — Telephone Encounter (Signed)
Called patient to discuss the results of his blood work and [facility] for him to call back. Most of his labs are really good, but cholesterol is quite high and triglycerides are really high. Should start atorvastatin given the diabetes. Will discuss this with him when he calls back.

## 2015-01-28 MED ORDER — ATORVASTATIN CALCIUM 40 MG PO TABS: 40.0000 mg | ORAL_TABLET | Freq: Every day | ORAL | Status: DC

## 2015-01-28 NOTE — Telephone Encounter (Signed)
Rx sent to his pharmacy. Discussed results with patient.

## 2015-02-02 ENCOUNTER — Encounter: Payer: 59 | Attending: Family Medicine | Admitting: *Deleted

## 2015-02-02 ENCOUNTER — Encounter: Payer: Self-pay | Admitting: *Deleted

## 2015-02-02 VITALS — BP 122/84 | Ht 71.0 in | Wt 261.7 lb

## 2015-02-02 DIAGNOSIS — E119 Type 2 diabetes mellitus without complications: Secondary | ICD-10-CM | POA: Diagnosis not present

## 2015-02-02 NOTE — Patient Instructions (Addendum)
Check blood sugars 1 x day before breakfast or  2 hrs after supper every day Exercise: Begin walking 15  minutes 3 days a week and gradually increase to 150 minutes/week Avoid sugar sweetened drinks (soda) Eat 3 meals day,  1-2  snacks a day Space meals 4-6 hours apart Limit foods high in fat Quit smoking Make an eye doctor appointment Bring blood sugar records to the next class Call your doctor for a prescription for:  1. Meter strips (type) True Metrix checking   1 time  per day  2. Lancets (type) True Metrix checking   1     time  per day

## 2015-02-03 ENCOUNTER — Telehealth: Payer: Self-pay | Admitting: Family Medicine

## 2015-02-03 NOTE — Progress Notes (Signed)
Diabetes Self-Management Education  Visit Type: First/Initial  Appt. Start Time: 1600 Appt. End Time: [id number]  02/03/2015  Tony Cardenas, identified by name and date of birth, is a 45 y.o. male with a diagnosis of Diabetes: Type 2.   ASSESSMENT  Blood pressure 122/84, height 5\' 11"  (1.803 m), weight 261 lb 11.2 oz (118.706 kg). Body mass index is 36.52 kg/(m^2).      Diabetes Self-Management Education - 02/02/15 1752    Visit Information   Visit Type First/Initial   Initial Visit   Diabetes Type Type 2   Are you currently following a meal plan? No   Are you taking your medications as prescribed? Yes   Date Diagnosed Jan 2017   Health Coping   How would you rate your overall health? Fair   Psychosocial Assessment   Patient Belief/Attitude about Diabetes Motivated to manage diabetes   Self-care barriers None   Self-management support Doctor's office;Family   Other persons present Spouse/SO   Patient Concerns Nutrition/Meal planning;Monitoring;Glycemic Control;Healthy Lifestyle;Weight Control   Special Needs None   Preferred Learning Style Auditory;Visual;Hands on   [place] in progress   How often do you need to have someone help you when you read instructions, pamphlets, or other written materials from your doctor or pharmacy? 1 - Never   What is the last grade level you completed in school? [id number]   Complications   Last HgB A1C per patient/outside source 8 %  01/26/15   How often do you check your blood sugar? 0 times/day (not testing)  Provided True Metrix meter and instructed on use. BG upon return demonstration was 176 mg/dL at 4:50 pm - 7 hrs pp.   Have you had a dilated eye exam in the past 12 months? No   Have you had a dental exam in the past 12 months? Yes   Are you checking your feet? Yes   How many days per week are you checking your feet? 7   Dietary Intake   Breakfast bacon, egge and chesse sandwich   Lunch pizza, hamburger, chicken wings,  fries   Dinner chopped steak, hot dog, hamburgers, chicken tenders, vegetables   Beverage(s) regular sodas   Exercise   Exercise Type ADL's   Patient Education   Previous Diabetes Education No   Disease state  Definition of diabetes, type 1 and 2, and the diagnosis of diabetes   Nutrition management  Role of diet in the treatment of diabetes and the relationship between the three main macronutrients and blood glucose level   Physical activity and exercise  Role of exercise on diabetes management, blood pressure control and cardiac health.   Medications Reviewed patients medication for diabetes, action, purpose, timing of dose and side effects.   Monitoring Taught/evaluated SMBG meter.;Purpose and frequency of SMBG.;Identified appropriate SMBG and/or A1C goals.   Chronic complications Relationship between chronic complications and blood glucose control   Psychosocial adjustment Identified and addressed patients feelings and concerns about diabetes   Personal strategies to promote health Review risk of smoking and offered smoking cessation   Individualized Goals (developed by patient)   Nutrition Follow meal plan discussed   Physical Activity Exercise 3-5 times per week   Medications take my medication as prescribed   Monitoring  test my blood glucose as discussed   Reducing Risk stop smoking Improve blood sugars Prevent diabetes complications Lose weight Lead a healthier lifestyle   Outcomes   Expected Outcomes Demonstrated interest in learning. Expect positive outcomes  Individualized Plan for Diabetes Self-Management Training:   Learning Objective:  Patient will have a greater understanding of diabetes self-management. Patient education plan is to attend individual and/or group sessions per assessed needs and concerns.   Plan:   Patient Instructions  Check blood sugars 1 x day before breakfast or  2 hrs after supper every day Exercise: Begin walking 15  minutes 3 days a  week and gradually increase to 150 minutes/week Avoid sugar sweetened drinks (soda) Eat 3 meals day,  1-2  snacks a day Space meals 4-6 hours apart Limit foods high in fat Quit smoking Make an eye doctor appointment Bring blood sugar records to the next class Call your doctor for a prescription for:  1. Meter strips (type) True Metrix checking   1 time  per day  2. Lancets (type) True Metrix checking   1     time  per day   Expected Outcomes:  Demonstrated interest in learning. Expect positive outcomes  Education material provided:  General Meal Planning Guidelines Simple Meal Plan Meter - True Metrix  If problems or questions, patient to contact team via:   Johny Drilling, [place], [place], CDE [phone]  Future DSME appointment:  Monday February 09, 2015 for Class 1

## 2015-02-03 NOTE — Telephone Encounter (Signed)
Patient wife called stating that patient went started yesterday [facility] and the person that is working with him stated that he needs Meter strip called true metrix he is testing ones a day. Send to [place] also he can go back to [place] once's he is done with the classes.

## 2015-02-03 NOTE — Telephone Encounter (Signed)
Rx faxed to pharmacy  

## 2015-02-03 NOTE — Telephone Encounter (Signed)
Routing to provider  

## 2015-02-03 NOTE — Telephone Encounter (Signed)
Rx written. Please send to [facility]/have them come pick up.

## 2015-02-09 ENCOUNTER — Encounter: Payer: Self-pay | Admitting: Dietician

## 2015-02-09 ENCOUNTER — Encounter: Payer: 59 | Admitting: Dietician

## 2015-02-09 VITALS — Wt 266.3 lb

## 2015-02-09 DIAGNOSIS — E119 Type 2 diabetes mellitus without complications: Secondary | ICD-10-CM

## 2015-02-09 NOTE — Progress Notes (Signed)
Appt. Start Time: 1800 Appt. End Time: 2030  Class 1 Diabetes Overview - define DM; state own type of DM; identify functions of pancreas and insulin; define insulin deficiency vs insulin resistance  Psychosocial - identify DM as a source of stress; state the effects of stress on BG control; verbalize appropriate stress management techniques; identify personal stress issues   Nutritional Management - describe effects of food on blood glucose; identify sources of carbohydrate, protein and fat; verbalize the importance of balance meals in controlling blood glucose; identify meals as well balanced or not; estimate servings of carbohydrate from menus; use food labels to identify servings size, content of carbohydrate, fiber, protein, fat, saturated fat and sodium; recognize food sources of fat, saturated fat, trans fat, sodium and verbalize goals for intake; describe healthful appropriate food choices when dining out   Exercise - describe the effects of exercise on blood glucose and importance of regular exercise in controlling diabetes; state a plan for personal exercise; verbalize contraindications for exercise  Self-Monitoring - state importance of HBGM and demo procedure accurately; use HBGM results to effectively manage diabetes; identify importance of regular HbA1C testing and goals for results  Acute Complications/Sick Day Guidelines - recognize hyperglycemia and hypoglycemia with causes and effects; identify blood glucose results as high, low or in control; list steps in treating and preventing high and low blood glucose; state appropriate measure to manage blood glucose when ill (need for meds, HBGM plan, when to call physician, need for fluids)  Chronic Complications/Foot, Skin, Eye Dental Care - identify possible long-term complications of diabetes (retinopathy, neuropathy, nephropathy, cardiovascular disease, infections); explain steps in prevention and treatment of chronic complications; state  importance of daily self-foot exams; describe how to examine feet and what to look for; explain appropriate eye and dental care  Lifestyle Changes/Goals & Health/Community Resources - state benefits of making appropriate lifestyle changes; identify habits that need to change (meals, tobacco, alcohol); identify strategies to reduce risk factors for personal health; set goals for proper diabetes care; state need for and frequency of healthcare follow-up; describe appropriate community resources for good health (ADA, web sites, apps)   Pregnancy/Sexual Health - define gestational diabetes; state importance of good blood glucose control and birth control prior to pregnancy; state importance of good blood glucose control in preventing sexual problems (impotence, vaginal dryness, infections, loss of desire); state relationship of blood glucose control and pregnancy outcome; describe risk of maternal and fetal complications  Teaching Materials Used: Class 1 Slides/Notebook Diabetes Booklet ID Card  Medic Alert/Medic ID Forms Sleep Evaluation Exercise Handout Daily Food Record Planning a Balanced Meal Goals for Class 1

## 2015-02-16 ENCOUNTER — Encounter: Payer: 59 | Attending: Family Medicine | Admitting: Dietician

## 2015-02-16 VITALS — Ht 71.0 in | Wt 267.5 lb

## 2015-02-16 DIAGNOSIS — E119 Type 2 diabetes mellitus without complications: Secondary | ICD-10-CM | POA: Insufficient documentation

## 2015-02-16 NOTE — Progress Notes (Signed)

## 2015-02-23 ENCOUNTER — Ambulatory Visit (INDEPENDENT_AMBULATORY_CARE_PROVIDER_SITE_OTHER): Payer: 59 | Admitting: Family Medicine

## 2015-02-23 ENCOUNTER — Encounter: Payer: Self-pay | Admitting: Family Medicine

## 2015-02-23 ENCOUNTER — Encounter: Payer: 59 | Admitting: Dietician

## 2015-02-23 VITALS — BP 128/80 | Ht 71.0 in | Wt 271.2 lb

## 2015-02-23 VITALS — BP 124/62 | HR 86 | Temp 97.8°F | Wt 270.0 lb

## 2015-02-23 DIAGNOSIS — E119 Type 2 diabetes mellitus without complications: Secondary | ICD-10-CM

## 2015-02-23 DIAGNOSIS — E785 Hyperlipidemia, unspecified: Secondary | ICD-10-CM | POA: Diagnosis not present

## 2015-02-23 DIAGNOSIS — E1165 Type 2 diabetes mellitus with hyperglycemia: Secondary | ICD-10-CM | POA: Diagnosis not present

## 2015-02-23 MED ORDER — METFORMIN HCL ER 500 MG PO TB24: 500.0000 mg | ORAL_TABLET | Freq: Two times a day (BID) | ORAL | Status: DC

## 2015-02-23 NOTE — Addendum Note (Signed)
Addended by: Valerie Roys on: 02/23/2015 04:25 PM   Modules accepted: Orders

## 2015-02-23 NOTE — Assessment & Plan Note (Signed)
Under good control on current regimen. Rechecking lipids and CMP today. Await results.

## 2015-02-23 NOTE — Progress Notes (Signed)
BP 124/62 mmHg  Pulse 86  Temp(Src) 97.8 F (36.6 C)  Wt 270 lb (122.471 kg)  SpO2 96%   Subjective:    Patient ID: Tony Cardenas, male    DOB: 09-16-1970, 45 y.o.   MRN: [id number]  HPI: Tony Cardenas is a 45 y.o. male  Chief Complaint  Patient presents with  . Diabetes   DIABETES Hypoglycemic episodes:no Polydipsia/polyuria: no Visual disturbance: yes Chest pain: no Paresthesias: no- betteer Glucose Monitoring: yes  Accucheck frequency: Daily  Post prandial: 140-200s Taking Insulin?: no Blood Pressure Monitoring: not checking Retinal Examination: Not up to Date- going next week Foot Exam: Up to Date Diabetic Education: Completed Pneumovax: Up to Date Influenza: Up to Date Aspirin: no  HYPERLIPIDEMIA Hyperlipidemia status: controlled Satisfied with current treatment?  yes Side effects:  no Medication compliance: excellent compliance Past cholesterol meds: none Supplements: none Aspirin:  no Chest pain:  no Coronary artery disease:  no Family history CAD:  no Family history early CAD:  no  Relevant past medical, surgical, family and social history reviewed and updated as indicated. Interim medical history since our last visit reviewed. Allergies and medications reviewed and updated.  Review of Systems  Constitutional: Negative.   Respiratory: Negative.   Cardiovascular: Negative.   Gastrointestinal: Negative.   Psychiatric/Behavioral: Negative.     Per HPI unless specifically indicated above     Objective:    BP 124/62 mmHg  Pulse 86  Temp(Src) 97.8 F (36.6 C)  Wt 270 lb (122.471 kg)  SpO2 96%  Wt Readings from Last 3 Encounters:  02/23/15 270 lb (122.471 kg)  02/16/15 267 lb 8 oz (121.337 kg)  02/09/15 266 lb 4.8 oz (120.793 kg)    Physical Exam  Constitutional: He is oriented to person, place, and time. He appears well-developed and well-nourished. No distress.  HENT:  Head: Normocephalic and atraumatic.  Right Ear: Hearing  normal.  Left Ear: Hearing normal.  Nose: Nose normal.  Eyes: Conjunctivae and lids are normal. Right eye exhibits no discharge. Left eye exhibits no discharge. No scleral icterus.  Pulmonary/Chest: Effort normal. No respiratory distress.  Musculoskeletal: Normal range of motion.  Neurological: He is alert and oriented to person, place, and time.  Skin: Skin is intact. No rash noted.  Psychiatric: He has a normal mood and affect. His speech is normal and behavior is normal. Judgment and thought content normal. Cognition and memory are normal.    Results for orders placed or performed in visit on 01/26/15  Bayer DCA Hb A1c Waived  Result Value Ref Range   Bayer DCA Hb A1c Waived 8.0 (H) <7.0 %  CBC with Differential/Platelet  Result Value Ref Range   WBC 10.7 3.4 - 10.8 x10E3/uL   RBC 4.84 4.14 - 5.80 x10E6/uL   Hemoglobin 15.1 12.6 - 17.7 g/dL   Hematocrit 44.0 37.5 - 51.0 %   MCV 91 79 - 97 fL   MCH 31.2 26.6 - 33.0 pg   MCHC 34.3 31.5 - 35.7 g/dL   RDW 13.2 12.3 - 15.4 %   Platelets 409 (H) 150 - 379 x10E3/uL   Neutrophils 65 %   Lymphs 25 %   Monocytes 6 %   Eos 2 %   Basos 1 %   Neutrophils Absolute 7.1 (H) 1.4 - 7.0 x10E3/uL   Lymphocytes Absolute 2.7 0.7 - 3.1 x10E3/uL   Monocytes Absolute 0.6 0.1 - 0.9 x10E3/uL   EOS (ABSOLUTE) 0.2 0.0 - 0.4 x10E3/uL   Basophils Absolute  0.1 0.0 - 0.2 x10E3/uL   Immature Granulocytes 1 %   Immature Grans (Abs) 0.1 0.0 - 0.1 x10E3/uL  Comprehensive metabolic panel  Result Value Ref Range   Glucose 149 (H) 65 - 99 mg/dL   BUN 8 6 - 24 mg/dL   Creatinine, Ser 0.65 (L) 0.76 - 1.27 mg/dL   GFR calc non Af Amer 117 >59 mL/min/1.73   GFR calc Af Amer 135 >59 mL/min/1.73   BUN/Creatinine Ratio 12 9 - 20   Sodium 138 134 - 144 mmol/L   Potassium 4.3 3.5 - 5.2 mmol/L   Chloride 99 96 - 106 mmol/L   CO2 21 18 - 29 mmol/L   Calcium 8.9 8.7 - 10.2 mg/dL   Total Protein 6.9 6.0 - 8.5 g/dL   Albumin 4.5 3.5 - 5.5 g/dL   Globulin, Total  2.4 1.5 - 4.5 g/dL   Albumin/Globulin Ratio 1.9 1.1 - 2.5   Bilirubin Total <0.2 0.0 - 1.2 mg/dL   Alkaline Phosphatase 70 39 - 117 IU/L   AST 35 0 - 40 IU/L   ALT 35 0 - 44 IU/L  Lipid Panel w/o Chol/HDL Ratio  Result Value Ref Range   Cholesterol, Total 252 (H) 100 - 199 mg/dL   Triglycerides 828 (HH) 0 - 149 mg/dL   HDL 23 (L) >39 mg/dL   VLDL Cholesterol Cal Comment 5 - 40 mg/dL   LDL Calculated Comment 0 - 99 mg/dL  Microalbumin, Urine Waived  Result Value Ref Range   Microalb, Ur Waived 10 0 - 19 mg/L   Creatinine, Urine Waived 100 10 - 300 mg/dL   Microalb/Creat Ratio <30 <30 mg/g  TSH  Result Value Ref Range   TSH 0.691 0.450 - 4.500 uIU/mL  UA/M w/rflx Culture, Routine  Result Value Ref Range   Specific Gravity, UA 1.015 1.005 - 1.030   pH, UA 6.0 5.0 - 7.5   Color, UA Yellow Yellow   Appearance Ur Clear Clear   Leukocytes, UA Negative Negative   Protein, UA Negative Negative/Trace   Glucose, UA 2+ (A) Negative   Ketones, UA Negative Negative   RBC, UA Negative Negative   Bilirubin, UA Negative Negative   Urobilinogen, Ur 0.2 0.2 - 1.0 mg/dL   Nitrite, UA Negative Negative      Assessment & Plan:   Problem List Items Addressed This Visit      Other   Type 2 diabetes mellitus with hyperglycemia ([place])    Doing better. Due for recheck in 2 months. Finishing education. Seeing eye doctor next week. Continue current regimen. Recheck 2 months.       Relevant Orders   Comprehensive metabolic panel   Hyperlipidemia - Primary    Under good control on current regimen. Rechecking lipids and CMP today. Await results.       Relevant Orders   Comprehensive metabolic panel   Lipid Panel [place], Waived   Lipid Panel w/o Chol/HDL Ratio       Follow up plan: Return in about 2 months (around 04/23/2015) for DM visit.

## 2015-02-23 NOTE — Assessment & Plan Note (Signed)
Doing better. Due for recheck in 2 months. Finishing education. Seeing eye doctor next week. Continue current regimen. Recheck 2 months.

## 2015-02-23 NOTE — Progress Notes (Signed)

## 2015-02-24 LAB — COMPREHENSIVE METABOLIC PANEL
ALBUMIN: 4.6 g/dL (ref 3.5–5.5)
ALT: 26 IU/L (ref 0–44)
AST: 22 IU/L (ref 0–40)
Albumin/Globulin Ratio: 2.1 (ref 1.1–2.5)
Alkaline Phosphatase: 78 IU/L (ref 39–117)
BUN / CREAT RATIO: 12 (ref 9–20)
BUN: 10 mg/dL (ref 6–24)
Bilirubin Total: 0.2 mg/dL (ref 0.0–1.2)
CO2: 22 mmol/L (ref 18–29)
CREATININE: 0.81 mg/dL (ref 0.76–1.27)
Calcium: 9.1 mg/dL (ref 8.7–10.2)
Chloride: 98 mmol/L (ref 96–106)
GFR calc non Af Amer: 107 mL/min/{1.73_m2} (ref >59)
GFR, EST AFRICAN AMERICAN: 123 mL/min/{1.73_m2} (ref >59)
GLUCOSE: 252 mg/dL — AB (ref 65–99)
Globulin, Total: 2.2 g/dL (ref 1.5–4.5)
Potassium: 4.4 mmol/L (ref 3.5–5.2)
Sodium: 138 mmol/L (ref 134–144)
TOTAL PROTEIN: 6.8 g/dL (ref 6.0–8.5)

## 2015-02-25 ENCOUNTER — Telehealth: Payer: Self-pay | Admitting: Family Medicine

## 2015-02-25 ENCOUNTER — Encounter: Payer: Self-pay | Admitting: *Deleted

## 2015-02-25 LAB — LIPID PANEL W/O CHOL/HDL RATIO
CHOLESTEROL TOTAL: 158 mg/dL (ref 100–199)
HDL: 23 mg/dL — AB (ref >39)
Triglycerides: 442 mg/dL — ABNORMAL HIGH (ref 0–149)

## 2015-02-25 LAB — SPECIMEN STATUS REPORT

## 2015-02-25 NOTE — Telephone Encounter (Signed)
Patient notified

## 2015-02-25 NOTE — Telephone Encounter (Signed)
Please let him know that his welchol is working. His triglycerides went down by 1/2, but they were still a little high because he ate. Thanks!

## 2015-03-26 DIAGNOSIS — H524 Presbyopia: Secondary | ICD-10-CM | POA: Diagnosis not present

## 2015-03-26 LAB — HM DIABETES EYE EXAM

## 2015-03-31 ENCOUNTER — Encounter: Payer: 59 | Admitting: Family Medicine

## 2015-04-08 ENCOUNTER — Ambulatory Visit: Payer: Self-pay

## 2015-04-08 ENCOUNTER — Other Ambulatory Visit: Payer: Self-pay

## 2015-04-08 VITALS — BP 132/70 | Ht 71.0 in | Wt 266.8 lb

## 2015-04-08 DIAGNOSIS — E1165 Type 2 diabetes mellitus with hyperglycemia: Secondary | ICD-10-CM

## 2015-04-08 NOTE — Patient Outreach (Signed)
[facility]) Care Management  [place]  04/08/2015   JARELL MCEWEN November 13, 1970 [id number]  Subjective: Patient in for his initial Link to Wellness visit for diabetes management.  He completed diabetes classess at the beginning of 2017 and was checking blood sugars regularly.  He had slacked off on checking and starting checking again about a week ago- they are recently 166-233m/dl about 2 hours after a meal. He does not check fasting blood sugars.  He reports eating out for most meals.  Taking all meds as ordered.  Reports feeling much better (cold lasted a shorter period, less blurred vision, less shoulder pain, less sleepy) since he's been diagnosed with diabetes and since he eats on a more routine meal schedule.  Objective:  Filed Vitals:   04/08/15 0830  BP: 132/70  Height: 1.803 m (_0 )  Weight: 266 lb 12.8 oz (121.02 kg)     Current Medications:  Current Outpatient Prescriptions  Medication Sig Dispense Refill  . atorvastatin (LIPITOR) 40 MG tablet Take 1 tablet (40 mg total) by mouth daily. 30 tablet 3  . blood glucose meter kit and supplies KIT Dispense based on patient and insurance preference. Use up to four times daily as directed. (FOR ICD-9 250.00, 250.01). 1 each 0  . fluticasone (FLONASE) 50 MCG/ACT nasal spray Place 2 sprays into both nostrils daily. PM (Patient taking differently: Place 2 sprays into both nostrils daily as needed. PM) 16 g 12  . loratadine (CLARITIN) 10 MG tablet Take 1 tablet (10 mg total) by mouth daily as needed for allergies. 30 tablet 12  . meloxicam (MOBIC) 15 MG tablet Take 1 tablet (15 mg total) by mouth daily. AM 30 tablet 3  . metFORMIN (GLUCOPHAGE XR) 500 MG 24 hr tablet Take 1 tablet (500 mg total) by mouth 2 (two) times daily. Once a day for the first week, then BID after that 60 tablet 3  . Pseudoephedrine HCl (SUDAFED 12 HOUR PO) Take 1 tablet by mouth 2 (two) times daily as needed.     . TRUE METRIX  BLOOD GLUCOSE TEST test strip   0  . TRUEPLUS LANCETS 30G MISC   0  . omeprazole (PRILOSEC) 40 MG capsule Take 1 capsule (40 mg total) by mouth as needed. PM (Patient not taking: Reported on 04/08/2015) 30 capsule 12   No current facility-administered medications for this visit.    Functional Status:  In your present state of health, do you have any difficulty performing the following activities: 04/08/2015  Hearing? N  Vision? N  Difficulty concentrating or making decisions? N  Walking or climbing stairs? N  Dressing or bathing? N  Doing errands, shopping? N    Fall/Depression Screening: PHQ 2/9 Scores 04/08/2015 02/02/2015  PHQ - 2 Score 0 0    Assessment: It appears that JMerry Proudhas decreased checking blood sugars as often as when he was first diagnosed.  He reports that he wants to quit smoking and has decreased from 2 packs a day to 1 pack but he is also chewing tobacco.  We discussed the need to check blood sugars most days of the week.  He keeps food with him at all times and does not go long periods without eating. He eats most meals out.   He has a lot of support from his wife.   Plan:  [facility] Problem One        Most Recent Value   Care Plan Problem One  New diagnosis of diabetes with elevated blood sugars   Role Documenting the Problem One  Care Management Coordinator   Care Plan for Problem One  Active   THN Long Term Goal (31-90 days)  Patient will discuss ongoing care with MD for optimal health   THN Long Term Goal Start Date  04/08/15   Interventions for Problem One Long Term Goal  1. check sugar most days of the week  2. ask MD about aspirin and ACE inhibitor  3. put your medical ID in your wallet  4. investigate options for smoking cessation     [name] stopped at the pharmacy and inquired about smoking cessation options- he will discuss with MD at next visit in May. I have asked him to write down his blood sugars so he can visually see trends.  Follow up next week  to evaluate blood sugars.    Gentry Fitz, RN, BA, [place], [place] Direct Dial:  [phone]  Fax:  [phone] E-mail: [email or web address] [address], [place], [place]  [address]

## 2015-04-20 ENCOUNTER — Other Ambulatory Visit: Payer: Self-pay

## 2015-04-20 NOTE — Patient Outreach (Signed)
[facility]) Care Management  04/20/2015  Tony Cardenas 1970/10/01 [id number]   I spoke to [facility] via phone today He has started to check his blood sugars twice a day as requested - fasting blood sugars  130-150mg /dl and 2 hour post prandial 160-200mg /dl.  He is surprised that when he eats a "good" meal that his blood sugars are elevated but when he eats a "bad" meal, they are so much better.  We discussed;   Salad with [place], a few croutons and water- blood sugar-210mg /dl  Cheese burger , no french fries 160mg /dl  I reviewed the need to have carbs, protein and vegetables with a small amount of fat to stabilize his blood sugar at each meal. Reviewed the salad meal which contained very little carb and no protein.   Reviewed the signs and symptoms of heart attack and stroke and sick day guidelines.  Discussed heart burn as a potential symptom of a heart attack- [name] says he does have heart burn often. Encouraged to discuss with MD.   [name] will continue to work on diet and checking blood sugars.  I will follow up with him in a few weeks.    Gentry Fitz, RN, BA, [place], [place] Direct Dial:  [phone]  Fax:  [phone] E-mail: [email or web address] [address], [place], [place]  [address]

## 2015-05-19 ENCOUNTER — Other Ambulatory Visit: Payer: Self-pay

## 2015-05-19 NOTE — Patient Outreach (Signed)
[facility]) Care Management  05/19/2015  Tony Cardenas [birth year] [id number]   I called to follow up with [name] regarding blood sugars.  I have left him a message asking him to update me on what his numbers have been and his scheduled appointment with his MD- in medical record, his visit is scheduled for 06/10/15- he will need an A1C at that time.    Gentry Fitz, RN, BA, [place], [place] Direct Dial:  [phone]  Fax:  [phone] E-mail: [email or web address] [address], [place],   [address]

## 2015-05-25 ENCOUNTER — Other Ambulatory Visit: Payer: Self-pay | Admitting: Family Medicine

## 2015-06-10 ENCOUNTER — Encounter: Payer: 59 | Admitting: Family Medicine

## 2015-06-16 ENCOUNTER — Other Ambulatory Visit: Payer: Self-pay | Admitting: Family Medicine

## 2015-06-19 ENCOUNTER — Encounter: Payer: 59 | Admitting: Family Medicine

## 2015-06-22 ENCOUNTER — Encounter: Payer: Self-pay | Admitting: *Deleted

## 2015-06-22 ENCOUNTER — Encounter: Payer: Self-pay | Admitting: Family Medicine

## 2015-06-22 ENCOUNTER — Ambulatory Visit (INDEPENDENT_AMBULATORY_CARE_PROVIDER_SITE_OTHER): Payer: 59 | Admitting: Family Medicine

## 2015-06-22 ENCOUNTER — Other Ambulatory Visit: Payer: Self-pay | Admitting: Family Medicine

## 2015-06-22 VITALS — BP 133/81 | HR 98 | Temp 98.8°F | Resp 22 | Ht 69.7 in | Wt 260.0 lb

## 2015-06-22 DIAGNOSIS — J449 Chronic obstructive pulmonary disease, unspecified: Secondary | ICD-10-CM

## 2015-06-22 DIAGNOSIS — K429 Umbilical hernia without obstruction or gangrene: Secondary | ICD-10-CM

## 2015-06-22 DIAGNOSIS — Z0000 Encounter for general adult medical examination without abnormal findings: Secondary | ICD-10-CM

## 2015-06-22 DIAGNOSIS — E1165 Type 2 diabetes mellitus with hyperglycemia: Secondary | ICD-10-CM

## 2015-06-22 DIAGNOSIS — K219 Gastro-esophageal reflux disease without esophagitis: Secondary | ICD-10-CM

## 2015-06-22 DIAGNOSIS — E785 Hyperlipidemia, unspecified: Secondary | ICD-10-CM

## 2015-06-22 LAB — LIPID PANEL PICCOLO, WAIVED

## 2015-06-22 MED ORDER — MELOXICAM 15 MG PO TABS: 15.0000 mg | ORAL_TABLET | Freq: Every day | ORAL | Status: DC

## 2015-06-22 MED ORDER — METFORMIN HCL ER (OSM) 1000 MG PO TB24: 1000.0000 mg | ORAL_TABLET | Freq: Two times a day (BID) | ORAL | Status: DC

## 2015-06-22 MED ORDER — ATORVASTATIN CALCIUM 40 MG PO TABS: 40.0000 mg | ORAL_TABLET | Freq: Every day | ORAL | Status: DC

## 2015-06-22 NOTE — Assessment & Plan Note (Signed)
Congratulated patient on quitting smoking! Continue abstinence. Continue to monitor.

## 2015-06-22 NOTE — Patient Instructions (Signed)

## 2015-06-22 NOTE — Progress Notes (Signed)
BP 133/81 mmHg  Pulse 98  Temp(Src) 98.8 F (37.1 C)  Resp 22  Ht 5' 9.7" (1.77 m)  Wt 260 lb (117.935 kg)  BMI 37.64 kg/m2  SpO2 95%   Subjective:    Patient ID: Tony Cardenas, male    DOB: 06/24/1970, 45 y.o.   MRN: [id number]  HPI: Tony Cardenas is a 45 y.o. male presenting on 06/22/2015 for comprehensive medical examination. Current medical complaints include:  DIABETES- quit smoking 2 weeks ago, his sugars are getting better.  Hypoglycemic episodes:no Polydipsia/polyuria: no Visual disturbance: no Chest pain: no Paresthesias: no Glucose Monitoring: yes  Accucheck frequency: BID- 150s-250s, usually under 200 Taking Insulin?: no Blood Pressure Monitoring: not checking Retinal Examination: Up to Date Foot Exam: Up to Date Diabetic Education: Completed Pneumovax: Up to Date Influenza: Up to Date Aspirin: no  HYPERLIPIDEMIA Hyperlipidemia status: excellent compliance Satisfied with current treatment?  yes Side effects:  no Medication compliance: excellent compliance Past cholesterol meds: atorvastain (lipitor) Supplements: none Aspirin:  no Chest pain:  no Coronary artery disease:  no Family history CAD:  yes Family history early CAD:  no  He currently lives with: wife Interim Problems from his last visit: no  Depression Screen done today and results listed below:  Depression screen [facility] 2/9 06/22/2015 04/08/2015 02/02/2015  Decreased Interest 0 0 0  Down, Depressed, Hopeless 0 0 0  PHQ - 2 Score 0 0 0    Past Medical History:  Past Medical History  Diagnosis Date  . Neck pain 1989    BROKEN NECK IN PAST/C1-2/ [employer]  . COPD (chronic obstructive pulmonary disease) ([place])   . GERD (gastroesophageal reflux disease)   . Arthritis     NECK AND RIGHT KNEE  . Diabetes mellitus without complication [facility])    Surgical History:  Past Surgical History  Procedure Laterality Date  . Neck surgery      C4-5 RUPTURED DISC  . Knee surgery Right      TORN ACL  . Colonoscopy N/A 06/02/2014    Procedure: COLONOSCOPY;  Surgeon: Lucilla Lame, MD;  Location: [place];  Service: Gastroenterology;  Laterality: N/A;  . Polypectomy  06/02/2014    Procedure: POLYPECTOMY INTESTINAL;  Surgeon: Lucilla Lame, MD;  Location: [place];  Service: Gastroenterology;;  . Appendectomy    . Hernia repair      Medications:  Current Outpatient Prescriptions on File Prior to Visit  Medication Sig  . blood glucose meter kit and supplies KIT Dispense based on patient and insurance preference. Use up to four times daily as directed. (FOR ICD-9 250.00, 250.01).  . fluticasone (FLONASE) 50 MCG/ACT nasal spray Place 2 sprays into both nostrils daily. PM (Patient taking differently: Place 2 sprays into both nostrils daily as needed. PM)  . loratadine (CLARITIN) 10 MG tablet Take 1 tablet (10 mg total) by mouth daily as needed for allergies.  Marland Kitchen omeprazole (PRILOSEC) 40 MG capsule Take 1 capsule (40 mg total) by mouth as needed. PM (Patient not taking: Reported on 04/08/2015)  . Pseudoephedrine HCl (SUDAFED 12 HOUR PO) Take 1 tablet by mouth 2 (two) times daily as needed.   . TRUE METRIX BLOOD GLUCOSE TEST test strip   . TRUEPLUS LANCETS 30G MISC USE AS DIRECTED UP TO 4 TIMES A DAY   No current facility-administered medications on file prior to visit.    Allergies:  No Known Allergies  Social History:  Social History   Social History  . Marital Status:  Married    Spouse Name: N/A  . Number of Children: N/A  . Years of Education: N/A   Occupational History  . Not on file.   Social History Main Topics  . Smoking status: Former Smoker -- 1.00 packs/day for 20 years    Types: Cigarettes    Quit date: 06/08/2015  . Smokeless tobacco: Former Systems developer    Types: Chew     Comment: chews tobacco  . Alcohol Use: No  . Drug Use: No  . Sexual Activity: Yes   Other Topics Concern  . Not on file   Social History Narrative   History  Smoking  status  . Former Smoker -- 1.00 packs/day for 20 years  . Types: Cigarettes  . Quit date: 06/08/2015  Smokeless tobacco  . Former Systems developer  . Types: Chew    Comment: chews tobacco   History  Alcohol Use No   Family History:  Family History  Problem Relation Age of Onset  . Diabetes Father   . Alcohol abuse Father   . COPD Father   . Diabetes Brother   . Diabetes Paternal Grandmother   . Cancer Mother    Past medical history, surgical history, medications, allergies, family history and social history reviewed with patient today and changes made to appropriate areas of the chart.   Review of Systems  Constitutional: Positive for malaise/fatigue. Negative for fever, chills, weight loss and diaphoresis.  HENT: Negative.   Eyes: Negative.   Respiratory: Negative.   Cardiovascular: Negative.   Gastrointestinal: Positive for heartburn (with food choices) and abdominal pain (concerned that his hernia is back, done in 2005, concerned the mesh is a problem). Negative for nausea, vomiting, diarrhea, constipation, blood in stool and melena.  Genitourinary: Negative.        Kidney stones- has cut out mountain dew  Musculoskeletal: Positive for neck pain. Negative for myalgias, back pain, joint pain and falls.  Skin: Negative.   Neurological: Positive for tingling (L foot- chronic, better than it was). Negative for dizziness, tremors, sensory change, speech change, focal weakness, seizures, loss of consciousness and weakness.  Endo/Heme/Allergies: Negative.   Psychiatric/Behavioral: Negative.    All other ROS negative except what is listed above and in the HPI.      Objective:    BP 133/81 mmHg  Pulse 98  Temp(Src) 98.8 F (37.1 C)  Resp 22  Ht 5' 9.7" (1.77 m)  Wt 260 lb (117.935 kg)  BMI 37.64 kg/m2  SpO2 95%  Wt Readings from Last 3 Encounters:  06/22/15 260 lb (117.935 kg)  04/08/15 266 lb 12.8 oz (121.02 kg)  02/23/15 271 lb 3.2 oz (123.016 kg)    Physical Exam   Constitutional: He is oriented to person, place, and time. He appears well-developed and well-nourished. No distress.  HENT:  Head: Normocephalic and atraumatic.  Right Ear: Hearing, tympanic membrane, external ear and ear canal normal.  Left Ear: Hearing, tympanic membrane, external ear and ear canal normal.  Nose: Nose normal.  Mouth/Throat: Uvula is midline, oropharynx is clear and moist and mucous membranes are normal. No oropharyngeal exudate.  Eyes: Conjunctivae, EOM and lids are normal. Pupils are equal, round, and reactive to light. Right eye exhibits no discharge. Left eye exhibits no discharge. No scleral icterus.  Neck: Normal range of motion. Neck supple. No JVD present. No tracheal deviation present. No thyromegaly present.  Cardiovascular: Normal rate, regular rhythm, normal heart sounds and intact distal pulses.  Exam reveals no gallop and no  friction rub.   No murmur heard. Pulmonary/Chest: Effort normal and breath sounds normal. No stridor. No respiratory distress. He has no wheezes. He has no rales. He exhibits no tenderness.  Abdominal: Soft. Bowel sounds are normal. He exhibits mass (umbilical hernia- small). He exhibits no distension. There is no tenderness. There is no rebound and no guarding. Hernia confirmed negative in the right inguinal area and confirmed negative in the left inguinal area.  Genitourinary: Testes normal and penis normal. Right testis shows no mass, no swelling and no tenderness. Right testis is descended. Cremasteric reflex is not absent on the right side. Left testis shows no mass, no swelling and no tenderness. Left testis is descended. Cremasteric reflex is not absent on the left side. Circumcised. No phimosis, paraphimosis, hypospadias, penile erythema or penile tenderness. No discharge found.  Musculoskeletal: Normal range of motion. He exhibits no edema or tenderness.  Lymphadenopathy:    He has no cervical adenopathy.  Neurological: He is alert and  oriented to person, place, and time. He has normal reflexes. He displays normal reflexes. No cranial nerve deficit. He exhibits normal muscle tone. Coordination normal.  Skin: Skin is warm, dry and intact. No rash noted. He is not diaphoretic. No erythema.  Psychiatric: He has a normal mood and affect. His speech is normal and behavior is normal. Judgment and thought content normal. Cognition and memory are normal.  Nursing note and vitals reviewed.   Results for orders placed or performed in visit on 03/27/15  HM DIABETES EYE EXAM  Result Value Ref Range   HM Diabetic Eye Exam No Retinopathy No Retinopathy      Assessment & Plan:   Problem List Items Addressed This Visit      Respiratory   COPD (chronic obstructive pulmonary disease) ([place])    Congratulated patient on quitting smoking! Continue abstinence. Continue to monitor.         Digestive   GERD (gastroesophageal reflux disease)    Stable on current regimen. Continue to monitor.         Other   Type 2 diabetes mellitus with hyperglycemia (HCC)    A1c down to 7.6, but still not under great control. Will increase his metformin to 1081m BID and check in in 3 months. Call with any concerns.       Relevant Medications   metformin (FORTAMET) 1000 MG (OSM) 24 hr tablet   atorvastatin (LIPITOR) 40 MG tablet   Hyperlipidemia   Relevant Medications   atorvastatin (LIPITOR) 40 MG tablet    Other Visit Diagnoses    Routine general medical examination at a health care facility    -  Primary    Up to date on vaccines. Screening labs checked today. Continue diet and exercise. Continue to monitor.     Umbilical hernia without obstruction and without gangrene        Referral back to general surgery. Call with any concerns.     Relevant Orders    Ambulatory referral to General Surgery        Discussed aspirin prophylaxis for myocardial infarction prevention and decision was it was not indicated  LABORATORY TESTING:  Health  maintenance labs ordered today as discussed above.   IMMUNIZATIONS:   - Tdap: Tetanus vaccination status reviewed: last tetanus booster within 10 years. - Influenza: Up to date - Pneumovax: Up to date  PATIENT COUNSELING:    Sexuality: Discussed sexually transmitted diseases, partner selection, use of condoms, avoidance of unintended pregnancy  and contraceptive  alternatives.   Advised to avoid cigarette smoking.  I discussed with the patient that most people either abstain from alcohol or drink within safe limits (<=14/week and <=4 drinks/occasion for males, <=7/weeks and <= 3 drinks/occasion for females) and that the risk for alcohol disorders and other health effects rises proportionally with the number of drinks per week and how often a drinker exceeds daily limits.  Discussed cessation/primary prevention of drug use and availability of treatment for abuse.   Diet: Encouraged to adjust caloric intake to maintain  or achieve ideal body weight, to reduce intake of dietary saturated fat and total fat, to limit sodium intake by avoiding high sodium foods and not adding table salt, and to maintain adequate dietary potassium and calcium preferably from fresh fruits, vegetables, and low-fat dairy products.    stressed the importance of regular exercise  Injury prevention: Discussed safety belts, safety helmets, smoke detector, smoking near bedding or upholstery.   Dental health: Discussed importance of regular tooth brushing, flossing, and dental visits.   Follow up plan: NEXT PREVENTATIVE PHYSICAL DUE IN 1 YEAR. Return in about 3 months (around 09/22/2015) for DM follow up.

## 2015-06-22 NOTE — Assessment & Plan Note (Signed)
A1c down to 7.6, but still not under great control. Will increase his metformin to 1000mg  BID and check in in 3 months. Call with any concerns.

## 2015-06-22 NOTE — Assessment & Plan Note (Signed)
Stable on current regimen. °Continue to monitor.  °

## 2015-06-23 ENCOUNTER — Telehealth: Payer: Self-pay | Admitting: Family Medicine

## 2015-06-23 DIAGNOSIS — R7989 Other specified abnormal findings of blood chemistry: Secondary | ICD-10-CM

## 2015-06-23 LAB — CBC WITH DIFFERENTIAL/PLATELET
BASOS: 1 %
Basophils Absolute: 0.1 10*3/uL (ref 0.0–0.2)
EOS (ABSOLUTE): 0.2 10*3/uL (ref 0.0–0.4)
EOS: 2 %
HEMATOCRIT: 44.6 % (ref 37.5–51.0)
HEMOGLOBIN: 15.5 g/dL (ref 12.6–17.7)
IMMATURE GRANS (ABS): 0 10*3/uL (ref 0.0–0.1)
IMMATURE GRANULOCYTES: 0 %
LYMPHS: 34 %
Lymphocytes Absolute: 4 10*3/uL — ABNORMAL HIGH (ref 0.7–3.1)
MCH: 31.2 pg (ref 26.6–33.0)
MCHC: 34.8 g/dL (ref 31.5–35.7)
MCV: 90 fL (ref 79–97)
MONOCYTES: 5 %
Monocytes Absolute: 0.6 10*3/uL (ref 0.1–0.9)
NEUTROS PCT: 58 %
Neutrophils Absolute: 6.8 10*3/uL (ref 1.4–7.0)
PLATELETS: 368 10*3/uL (ref 150–379)
RBC: 4.97 x10E6/uL (ref 4.14–5.80)
RDW: 13.8 % (ref 12.3–15.4)
WBC: 11.7 10*3/uL — AB (ref 3.4–10.8)

## 2015-06-23 LAB — LIPID PANEL W/O CHOL/HDL RATIO
CHOLESTEROL TOTAL: 176 mg/dL (ref 100–199)
HDL: 26 mg/dL — ABNORMAL LOW (ref >39)
Triglycerides: 611 mg/dL (ref 0–149)

## 2015-06-23 LAB — COMPREHENSIVE METABOLIC PANEL
A/G RATIO: 1.9 (ref 1.2–2.2)
ALT: 22 IU/L (ref 0–44)
AST: 18 IU/L (ref 0–40)
Albumin: 4.8 g/dL (ref 3.5–5.5)
Alkaline Phosphatase: 73 IU/L (ref 39–117)
BILIRUBIN TOTAL: 0.3 mg/dL (ref 0.0–1.2)
BUN/Creatinine Ratio: 14 (ref 9–20)
BUN: 10 mg/dL (ref 6–24)
CALCIUM: 9.5 mg/dL (ref 8.7–10.2)
CHLORIDE: 98 mmol/L (ref 96–106)
CO2: 19 mmol/L (ref 18–29)
Creatinine, Ser: 0.74 mg/dL — ABNORMAL LOW (ref 0.76–1.27)
GFR calc Af Amer: 128 mL/min/{1.73_m2} (ref >59)
GFR, EST NON AFRICAN AMERICAN: 111 mL/min/{1.73_m2} (ref >59)
GLOBULIN, TOTAL: 2.5 g/dL (ref 1.5–4.5)
Glucose: 205 mg/dL — ABNORMAL HIGH (ref 65–99)
POTASSIUM: 4.2 mmol/L (ref 3.5–5.2)
SODIUM: 139 mmol/L (ref 134–144)
Total Protein: 7.3 g/dL (ref 6.0–8.5)

## 2015-06-23 LAB — UA/M W/RFLX CULTURE, ROUTINE
BILIRUBIN UA: NEGATIVE
Leukocytes, UA: NEGATIVE
NITRITE UA: NEGATIVE
PH UA: 5 (ref 5.0–7.5)
Protein, UA: NEGATIVE
Specific Gravity, UA: 1.025 (ref 1.005–1.030)
UUROB: 1 mg/dL (ref 0.2–1.0)

## 2015-06-23 LAB — MICROSCOPIC EXAMINATION

## 2015-06-23 LAB — MICROALBUMIN, URINE WAIVED
CREATININE, URINE WAIVED: 200 mg/dL (ref 10–300)
Microalb, Ur Waived: 80 mg/L — ABNORMAL HIGH (ref 0–19)

## 2015-06-23 LAB — TSH: TSH: 0.443 u[IU]/mL — AB (ref 0.450–4.500)

## 2015-06-23 LAB — SPECIMEN STATUS REPORT

## 2015-06-23 LAB — BAYER DCA HB A1C WAIVED: HB A1C (BAYER DCA - WAIVED): 7.6 % — ABNORMAL HIGH (ref <7.0)

## 2015-06-23 NOTE — Telephone Encounter (Signed)
Please let him know that his labs all looked good except his sugar being a little high and his thyroid being a little over active. I'd like him to come back in 1 month just for a lab check to recheck it. Let me know if he has any questions. Thanks!

## 2015-06-30 ENCOUNTER — Other Ambulatory Visit: Payer: Self-pay

## 2015-06-30 NOTE — Patient Outreach (Signed)
Tony Cardenas) Care Management  06/30/2015  Tony Cardenas [facility] 1970/07/31 [id number]   Vinie Sill at pre-determined time but no answer.  Called him to get an update regarding his recent appointment with MD.  Requested he return my call.   Gentry Fitz, RN, BA, [place], [place] Direct Dial:  [phone]  Fax:  [phone] E-mail: [email or web address] [address], [place], [place]  [address]

## 2015-07-02 ENCOUNTER — Ambulatory Visit: Payer: Self-pay | Admitting: General Surgery

## 2015-07-06 ENCOUNTER — Encounter: Payer: Self-pay | Admitting: *Deleted

## 2015-07-15 ENCOUNTER — Ambulatory Visit: Payer: Self-pay | Admitting: General Surgery

## 2015-07-20 ENCOUNTER — Other Ambulatory Visit: Payer: Self-pay

## 2015-07-20 DIAGNOSIS — E1165 Type 2 diabetes mellitus with hyperglycemia: Secondary | ICD-10-CM

## 2015-07-20 NOTE — Patient Outreach (Signed)
[facility]) Care Management  07/20/2015  Tony Cardenas [birth year] [id number]  I called and spoke to Tony Cardenas today re: his diabetes.  Tells me has seen the doctor and his A1C is 7.6% so she has increased his Metformin to 1072m bid- he denies any side affects. He reports fasting blood sugars are 130-1468mdl and 2 hours after a meal, 140-17068ml but nothing over 200m26m.  He is "drinking mostly water but I will drink , Sprite, diet Sprite, sweet tea  and clear sodas".  He reports he has quit smoking using the "patch and "now it makes me sick".   He reports that "I mentally feel 100% better".  He complains of discomfort in his belly button stating "there is a recall on the mesh that they used for my hernia- I'm seeing the surgeon soon".   [name] reports "there is something wrong with my thyroid- I'm going to see the doctor tomorrow to do some more tests".    THN CM Care Plan Problem One        Most Recent Value   Care Plan for Problem One  Not Active   THN Long Term Goal (31-90 days)  Patient will discuss ongoing care with MD for optimal health   THN Long Term Goal Start Date  04/08/15   [facility] Met Date  07/20/15   Interventions for Problem One Long Term Goal  Praised patient for taking meds as ordered  2. encouraged elimination of sweetened beverages  3. encouraged patient exercise outside work hours  4. praised for [employer] Corporationcks daily  5. praised on spreading meals out through the day- breakfast, snack, lunch, snack and supper     THN CM Care Plan Problem Two        Most Recent Value   Care Plan Problem Two  Elevated A1C @7 .6%   Role Documenting the Problem Two  Care Management Coordinator   Care Plan for Problem Two  Active   Interventions for Problem Two Long Term Goal   1. praised for taking meds as ordered 2. encouraged to drink diet drinks only - try to eliminate sweetened beverages 3. praised for quitting smoking  4. Praised for changing diet and  creating small meals out over the day.    THN Long Term Goal (31-90) days  Patient will decrease his A1C to less than 7 or at 7% by October 10, 2015   TH[facility] Term Goal Start Date  07/20/15     Follow up by phone in 1 month   JuliGentry Fitz, BA, [phone]:  [phone]: [email or web address] [phone]  [phone]

## 2015-07-21 ENCOUNTER — Other Ambulatory Visit: Payer: 59

## 2015-07-21 DIAGNOSIS — R7989 Other specified abnormal findings of blood chemistry: Secondary | ICD-10-CM

## 2015-07-22 ENCOUNTER — Encounter: Payer: Self-pay | Admitting: Family Medicine

## 2015-07-22 LAB — THYROID PANEL WITH TSH
FREE THYROXINE INDEX: 2.6 (ref 1.2–4.9)
T3 Uptake Ratio: 30 % (ref 24–39)
T4 TOTAL: 8.6 ug/dL (ref 4.5–12.0)
TSH: 0.565 u[IU]/mL (ref 0.450–4.500)

## 2015-07-27 ENCOUNTER — Encounter: Payer: Self-pay | Admitting: General Surgery

## 2015-07-27 ENCOUNTER — Ambulatory Visit (INDEPENDENT_AMBULATORY_CARE_PROVIDER_SITE_OTHER): Payer: 59 | Admitting: General Surgery

## 2015-07-27 VITALS — BP 130/76 | HR 74 | Resp 12 | Ht 70.0 in | Wt 263.0 lb

## 2015-07-27 DIAGNOSIS — M6208 Separation of muscle (nontraumatic), other site: Secondary | ICD-10-CM | POA: Diagnosis not present

## 2015-07-27 NOTE — Progress Notes (Signed)
Patient ID: Tony Cardenas, male   DOB: 09-23-1970, 45 y.o.   MRN: [id number]  Chief Complaint  Patient presents with  . Other    hernia    HPI Tony Cardenas is a 45 y.o. male here for evaluation of an umbilical hernia. He first noticed this about 6-8 weeks ago. He woke up and the area above his belly button was tender. He reports bulging in the area, but no pain. He has been careful with lifting. He reports no GI symptoms. He had this area repaired twice in the past over 10 years ago.  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    HPI  Past Medical History  Diagnosis Date  . Neck pain 1989    BROKEN NECK IN PAST/C1-2/ [employer]  . COPD (chronic obstructive pulmonary disease) ([place])   . GERD (gastroesophageal reflux disease)   . Arthritis     NECK AND RIGHT KNEE  . Diabetes mellitus without complication ([place])   . Hyperlipidemia     Past Surgical History  Procedure Laterality Date  . Neck surgery  1989    C4-5 RUPTURED DISC  . Knee surgery Right     TORN ACL  . Colonoscopy N/A 06/02/2014    Procedure: COLONOSCOPY;  Surgeon: Lucilla Lame, MD;  Location: [place];  Service: Gastroenterology;  Laterality: N/A;  . Polypectomy  06/02/2014    Procedure: POLYPECTOMY INTESTINAL;  Surgeon: Lucilla Lame, MD;  Location: [place];  Service: Gastroenterology;;  . Appendectomy    . Hernia repair  over 10 years ago    umbilical-repaired twice-[place]    Family History  Problem Relation Age of Onset  . Diabetes Father   . Alcohol abuse Father   . COPD Father   . Diabetes Brother   . Diabetes Paternal Grandmother   . Cancer Mother     Social History Social History  Substance Use Topics  . Smoking status: Former Smoker -- 1.00 packs/day for 20 years     Types: Cigarettes    Quit date: 06/08/2015  . Smokeless tobacco: Former Systems developer    Types: Chew     Comment: chews tobacco  . Alcohol Use: No    No Known Allergies  Current Outpatient Prescriptions  Medication Sig Dispense Refill  . atorvastatin (LIPITOR) 40 MG tablet Take 1 tablet (40 mg total) by mouth daily. 90 tablet 1  . blood glucose meter kit and supplies KIT Dispense based on patient and insurance preference. Use up to four times daily as directed. (FOR ICD-9 250.00, 250.01). 1 each 0  . fluticasone (FLONASE) 50 MCG/ACT nasal spray Place 2 sprays into both nostrils daily. PM (Patient taking differently: Place 2 sprays into both nostrils daily as needed. PM) 16 g 12  . ibuprofen (GOODSENSE IBUPROFEN) 200 MG tablet Take by mouth.    . loratadine (CLARITIN) 10 MG tablet Take 1 tablet (10 mg total) by mouth daily as needed for allergies. 30 tablet 12  . meloxicam (MOBIC) 15 MG tablet Take 1 tablet (15 mg total) by mouth daily. AM 90 tablet 1  . metformin (FORTAMET) 1000 MG (OSM) 24 hr tablet Take 1 tablet (1,000 mg total) by mouth 2 (two) times daily. 180 tablet 1  . omeprazole (PRILOSEC) 40 MG capsule Take 1 capsule (40 mg  total) by mouth as needed. PM 30 capsule 12  . Pseudoephedrine HCl (SUDAFED 12 HOUR PO) Take 1 tablet by mouth 2 (two) times daily as needed.      No current facility-administered medications for this visit.    Review of Systems Review of Systems  Constitutional: Negative.   Respiratory: Negative.   Cardiovascular: Negative.     Blood pressure 130/76, pulse 74, resp. rate 12, height _0  (1.778 m), weight 263 lb (119.296 kg).  Physical Exam Physical Exam  Constitutional: He is oriented to person, place, and time. He appears well-developed and well-nourished.  Eyes: Conjunctivae are normal. No scleral icterus.  Neck: Neck supple.  Cardiovascular: Normal rate, regular rhythm and normal heart sounds.   Pulmonary/Chest: Effort normal and breath sounds  normal.  Abdominal: Soft. Bowel sounds are normal. There is no tenderness. No hernia.    Transverse incision above the belly button.     Lymphadenopathy:    He has no cervical adenopathy.  Neurological: He is alert and oriented to person, place, and time.  Skin: Skin is warm and dry.  Psychiatric: He has a normal mood and affect.    Data Reviewed 06/22/2015 PCP exam.  Assessment    Diastases recti, no evidence of recurrent umbilical hernia.    Plan    Proper lifting technique reviewed. Patient should call if he develops any new symptoms. Follow up otherwise will be on an as-needed basis.    PCP: [facility] This has been scribed by Lesly Rubenstein LPN    [place], [place] 07/28/2015, 12:40 PM

## 2015-07-27 NOTE — Patient Instructions (Signed)
Diastasis Recti Follow up as needed.

## 2015-07-28 DIAGNOSIS — M6208 Separation of muscle (nontraumatic), other site: Secondary | ICD-10-CM | POA: Insufficient documentation

## 2015-08-21 ENCOUNTER — Other Ambulatory Visit: Payer: Self-pay

## 2015-08-21 NOTE — Patient Outreach (Signed)
[facility]) Care Management  [place]  08/21/2015   Tony Cardenas [facility] 26-Apr-1970 [id number]  Subjective: Spoke with patient today- he's under a tremendous amount of stress at work and is sometimes working 12-13 hours a day. He finds if he's working that much his blood sugars are worse because he's "eating on the run" When he works 10 hours, he tells me sugars in the evenings are 150-157m/dl. He tells me sugars are 130-1625mdl most of the time.  He is still taking his Metformin 100072mid and has not had to use any Prilosec since he stopped smoking. He continues to have chronic pain but he manages it with Mobic and ibuprofen.    Encounter Medications:  Outpatient Encounter Prescriptions as of 08/21/2015  Medication Sig Note  . atorvastatin (LIPITOR) 40 MG tablet Take 1 tablet (40 mg total) by mouth daily.   . blood glucose meter kit and supplies KIT Dispense based on patient and insurance preference. Use up to four times daily as directed. (FOR ICD-9 250.00, 250.01).   . fluticasone (FLONASE) 50 MCG/ACT nasal spray Place 2 sprays into both nostrils daily. PM (Patient taking differently: Place 2 sprays into both nostrils daily as needed. PM)   . ibuprofen (GOODSENSE IBUPROFEN) 200 MG tablet Take by mouth. 06/22/2015: Received from: [place] loratadine (CLARITIN) 10 MG tablet Take 1 tablet (10 mg total) by mouth daily as needed for allergies.   . meloxicam (MOBIC) 15 MG tablet Take 1 tablet (15 mg total) by mouth daily. AM   . metformin (FORTAMET) 1000 MG (OSM) 24 hr tablet Take 1 tablet (1,000 mg total) by mouth 2 (two) times daily.   . Pseudoephedrine HCl (SUDAFED 12 HOUR PO) Take 1 tablet by mouth 2 (two) times daily as needed.  12/26/2014: [place]: [place] omeprazole (PRILOSEC) 40 MG capsule Take 1 capsule (40 mg total) by mouth as needed. PM (Patient not taking: Reported on 08/21/2015) 08/21/2015: Hasn't  needed it since he stopped smoking   No facility-administered encounter medications on file as of 08/21/2015.     Functional Status:  In your present state of health, do you have any difficulty performing the following activities: 04/08/2015  Hearing? N  Vision? N  Difficulty concentrating or making decisions? N  Walking or climbing stairs? N  Dressing or bathing? N  Doing errands, shopping? N  Some recent data might be hidden    Fall/Depression Screening: PHQ 2/9 Scores 08/21/2015 06/22/2015 04/08/2015 02/02/2015  PHQ - 2 Score 0 0 0 0    Plan:  [facility]   Flowsheet Row Most Recent Value  Care Plan Problem Two  Elevated A1C @7 .6%  Role Documenting the Problem Two  Care Management Coordinator  Care Plan for Problem Two  Active  Interventions for Problem Two Long Term Goal   1. discussed the benefits of maintaining no smoking 2. discussed the need to drink diet- he already uses Splenda in his coffee so he doen't mind the taste  THN Long Term Goal (31-90) days  Patient will decrease his A1C to less than 7 or at 7% by October 10, 2015  [facility] Go[facility] Start Date  08/21/15 [continue from last date]     JefMerry Proudows his blood sugars are better when he adds snacks to his day but he's not always doing it. A major contributor to his elevated sugars is probably his sweetened beverages- please encourage  him to stop drinking them- he enjoys Splenda so he needs to be using it.    I have asked him to update me with his A1C when he sees OD- I will schedule with him after that.   Gentry Fitz, RN, BA, [place], [place] Direct Dial:  [phone]  Fax:  [phone] E-mail: [email or web address] [address] [place], [place]  [address]

## 2015-09-09 ENCOUNTER — Ambulatory Visit: Payer: 59

## 2015-09-17 ENCOUNTER — Encounter: Payer: Self-pay | Admitting: *Deleted

## 2015-09-22 ENCOUNTER — Encounter: Payer: Self-pay | Admitting: Family Medicine

## 2015-09-22 ENCOUNTER — Ambulatory Visit (INDEPENDENT_AMBULATORY_CARE_PROVIDER_SITE_OTHER): Payer: 59 | Admitting: Family Medicine

## 2015-09-22 VITALS — BP 130/82 | HR 78 | Temp 98.7°F | Wt 267.0 lb

## 2015-09-22 DIAGNOSIS — R5382 Chronic fatigue, unspecified: Secondary | ICD-10-CM | POA: Diagnosis not present

## 2015-09-22 DIAGNOSIS — R7989 Other specified abnormal findings of blood chemistry: Secondary | ICD-10-CM

## 2015-09-22 DIAGNOSIS — E1165 Type 2 diabetes mellitus with hyperglycemia: Secondary | ICD-10-CM | POA: Diagnosis not present

## 2015-09-22 DIAGNOSIS — M2550 Pain in unspecified joint: Secondary | ICD-10-CM | POA: Diagnosis not present

## 2015-09-22 DIAGNOSIS — Z23 Encounter for immunization: Secondary | ICD-10-CM | POA: Diagnosis not present

## 2015-09-22 LAB — BAYER DCA HB A1C WAIVED: HB A1C (BAYER DCA - WAIVED): 8.1 % — ABNORMAL HIGH (ref <7.0)

## 2015-09-22 LAB — HEMOGLOBIN A1C: HEMOGLOBIN A1C: 8.1

## 2015-09-22 MED ORDER — NAPROXEN 500 MG PO TABS: 500.0000 mg | ORAL_TABLET | Freq: Two times a day (BID) | ORAL | 1 refills | Status: DC

## 2015-09-22 MED ORDER — EMPAGLIFLOZIN 10 MG PO TABS: 10.0000 mg | ORAL_TABLET | Freq: Every day | ORAL | 1 refills | Status: DC

## 2015-09-22 NOTE — Patient Instructions (Addendum)
Generic Wrist Exercises RANGE OF MOTION (ROM) AND STRETCHING EXERCISES - Wrist Sprain  These exercises may help you when beginning to rehabilitate your injury. Your symptoms may resolve with or without further involvement from your physician, physical therapist, or athletic trainer. While completing these exercises, remember:   Restoring tissue flexibility helps normal motion to return to the joints. This allows healthier, less painful movement and activity.  An effective stretch should be held for at least 30 seconds.  A stretch should never be painful. You should only feel a gentle lengthening or release in the stretched tissue. RANGE OF MOTION - Wrist Flexion, Active-Assisted  Extend your right / left elbow with your palm pointing down.*  Gently pull the back of your hand toward you until you feel a gentle stretch on the top of your forearm.  Hold this position for __________ seconds. Repeat __________ times. Complete this exercise __________ times per day.  *If directed by your physician, physical therapist, or athletic trainer, complete this stretch with your elbow bent rather than extended. RANGE OF MOTION - Wrist Extension, Active-Assisted   Extend your right / left elbow and turn your palm upward.*  Gently pull your palm/fingertips back so your wrist extends and your fingers point more toward the ground.  You should feel a gentle stretch on the inside of your forearm.  Hold this position for __________ seconds. Repeat __________ times. Complete this exercise __________ times per day. *If directed by your physician, physical therapist, or athletic trainer, complete this stretch with your elbow bent, rather than extended. RANGE OF MOTION - Supination, Active   Stand or sit with your elbows at your side. Bend your right / left elbow to 90 degrees.  Turn your palm upward until you feel a gentle stretch on the inside of your forearm.  Hold this position for __________ seconds.  Slowly release and return to the starting position. Repeat __________ times. Complete this stretch __________ times per day.  RANGE OF MOTION - Pronation, Active   Stand or sit with your elbows at your side. Bend your right / left elbow to 90 degrees.  Turn your palm downward until you feel a gentle stretch on the top of your forearm.  Hold this position for __________ seconds. Slowly release and return to the starting position. Repeat __________ times. Complete this stretch __________ times per day.  STRENGTHENING EXERCISES  These exercises may help you when beginning to rehabilitate your injury. They may resolve your symptoms with or without further involvement from your physician, physical therapist, or athletic trainer. While completing these exercises, remember:   Muscles can gain both the endurance and the strength needed for everyday activities through controlled exercises.  Complete these exercises as instructed by your physician, physical therapist, or athletic trainer. Progress the resistance and repetitions only as guided.  You may experience muscle soreness or fatigue, but the pain or discomfort you are trying to eliminate should never worsen during these exercises. If this pain does worsen, stop and make certain you are following the directions exactly. If the pain is still present after adjustments, discontinue the exercise until you can discuss the trouble with your clinician. STRENGTH - Wrist Flexors  Sit with your right / left forearm palm-up and fully supported. Your elbow should be resting below the height of your shoulder. Allow your wrist to extend over the edge of the surface.  Loosely holding a __________ weight or a piece of rubber exercise band/tubing, slowly curl your hand up toward your  forearm.  Hold this position for __________ seconds. Slowly lower the wrist back to the starting position in a controlled manner. Repeat __________ times. Complete this exercise  __________ times per day.  STRENGTH - Wrist Extensors  Sit with your right / left forearm palm-down and fully supported. Your elbow should be resting below the height of your shoulder. Allow your wrist to extend over the edge of the surface.  Loosely holding a __________ weight or a piece of rubber exercise band/tubing, slowly curl your hand up toward your forearm.  Hold this position for __________ seconds. Slowly lower the wrist back to the starting position in a controlled manner. Repeat __________ times. Complete this exercise __________ times per day.  STRENGTH - Forearm Supinators  Sit with your right / left forearm supported on a table, keeping your elbow below shoulder height. Rest your hand over the edge, palm down.  Gently grip a hammer or a soup ladle.  Without moving your elbow, slowly turn your palm and hand upward to a "thumbs-up" position.  Hold this position for __________ seconds. Slowly return to the starting position. Repeat __________ times. Complete this exercise __________ times per day.  STRENGTH - Forearm Pronators   Sit with your right / left forearm supported on a table, keeping your elbow below shoulder height. Rest your hand over the edge, palm up.  Gently grip a hammer or a soup ladle.  Without moving your elbow, slowly turn your palm and hand upward to a "thumbs-up" position.  Hold this position for __________ seconds. Slowly return to the starting position. Repeat __________ times. Complete this exercise __________ times per day.  STRENGTH - Grip  Grasp a tennis ball, a dense sponge, or a large, rolled sock in your hand.  Squeeze as hard as you can without increasing any pain.  Hold this position for __________ seconds. Release your grip slowly. Repeat __________ times. Complete this exercise __________ times per day.    This information is not intended to replace advice given to you by your health care provider. Make sure you discuss any questions  you have with your health care provider.   Document Released: 11/10/2004 Document Revised: 01/17/2014 Document Reviewed: 04/10/2008 Elsevier Interactive Patient Education [employer].

## 2015-09-22 NOTE — Progress Notes (Signed)
BP 130/82 (BP Location: Left Arm, Patient Position: Sitting, Cuff Size: Large)   Pulse 78   Temp 98.7 F (37.1 C)   Wt 267 lb (121.1 kg)   SpO2 95%   BMI 38.31 kg/m    Subjective:    Patient ID: Tony Cardenas, male    DOB: Nov 02, 1970, 45 y.o.   MRN: [id number]  HPI: Tony Cardenas is a 45 y.o. male  Chief Complaint  Patient presents with  . Diabetes  . Wrist Pain    left wrist   DIABETES Hypoglycemic episodes:no Polydipsia/polyuria: yes Visual disturbance: yes- when he forgot to take his medicine Chest pain: no Paresthesias: no Glucose Monitoring: yes  Accucheck frequency: occasionally  Fasting glucose: 160-180 Taking Insulin?: no Blood Pressure Monitoring: not checking Retinal Examination: Up to Date Foot Exam: Up to Date Diabetic Education: Completed Pneumovax: Up to Date Influenza: Up to Date Aspirin: no  WRIST PAIN  Duration: 2 months Involved wrist: left Mechanism of injury:  no trauma Location: ulnar Onset: sudden Severity: 7/10  Quality:  Sharp with movement aching all the time Frequency: constant Radiation: yes- into his arm Aggravating factors: moving   Alleviating factors: not moving   Status: worse Treatments attempted: rest, ice, heat, APAP, ibuprofen and aleve    Relief with NSAIDs?:  no Weakness: yes Numbness: no  Redness: no Bruising: no Swelling: no Fevers: no  Relevant past medical, surgical, family and social history reviewed and updated as indicated. Interim medical history since our last visit reviewed. Allergies and medications reviewed and updated.  Review of Systems  Constitutional: Positive for fatigue. Negative for activity change, appetite change, chills, diaphoresis, fever and unexpected weight change.  Respiratory: Negative.   Cardiovascular: Negative.   Musculoskeletal: Positive for arthralgias. Negative for back pain, gait problem, joint swelling, myalgias, neck pain and neck stiffness.    Psychiatric/Behavioral: Negative.     Per HPI unless specifically indicated above     Objective:    BP 130/82 (BP Location: Left Arm, Patient Position: Sitting, Cuff Size: Large)   Pulse 78   Temp 98.7 F (37.1 C)   Wt 267 lb (121.1 kg)   SpO2 95%   BMI 38.31 kg/m   Wt Readings from Last 3 Encounters:  09/22/15 267 lb (121.1 kg)  07/27/15 263 lb (119.3 kg)  06/22/15 260 lb (117.9 kg)    Physical Exam  Constitutional: He is oriented to person, place, and time. He appears well-developed and well-nourished. No distress.  HENT:  Head: Normocephalic and atraumatic.  Right Ear: Hearing normal.  Left Ear: Hearing normal.  Nose: Nose normal.  Eyes: Conjunctivae and lids are normal. Right eye exhibits no discharge. Left eye exhibits no discharge. No scleral icterus.  Cardiovascular: Normal rate, regular rhythm, normal heart sounds and intact distal pulses.  Exam reveals no gallop and no friction rub.   No murmur heard. Pulmonary/Chest: Effort normal and breath sounds normal. No respiratory distress. He has no wheezes. He has no rales. He exhibits no tenderness.  Musculoskeletal: Normal range of motion.  Tenderness along L ulna, decreased ROM to flexion and ulnar deviation  Neurological: He is alert and oriented to person, place, and time.  Skin: Skin is warm, dry and intact. No rash noted. No erythema. No pallor.  Psychiatric: He has a normal mood and affect. His speech is normal and behavior is normal. Judgment and thought content normal. Cognition and memory are normal.  Nursing note and vitals reviewed.   Results for orders placed  or performed in visit on 09/22/15  Hemoglobin A1c  Result Value Ref Range   Hemoglobin A1C 8.1       Assessment & Plan:   Problem List Items Addressed This Visit      Other   Type 2 diabetes mellitus with hyperglycemia ([place]) - Primary    Not under great control. Will continue metformin and add jardiance and recheck on tolerance in 1 month.        Relevant Medications   empagliflozin (JARDIANCE) 10 MG TABS tablet   Other Relevant Orders   Bayer DCA Hb A1c Waived   Lyme Ab/Western Blot Reflex   Rocky mtn spotted fvr abs pnl(IgG+IgM)   Babesia microti Antibody Panel   Ehrlichia Antibody Panel   Comprehensive metabolic panel    Other Visit Diagnoses    Immunization due       Flu shot given today.   Relevant Orders   Flu Vaccine QUAD 36+ mos PF IM (Fluarix & Fluzone Quad PF) (Completed)   Chronic fatigue       New onset. Will check labs. 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 Await results.    Relevant Orders   Comprehensive metabolic panel   Arthralgia       New onset. Will check labs and x-ray. 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 Await results. Stretches given.    Relevant Orders   Lyme Ab/Western Blot Reflex   Rocky mtn spotted fvr abs pnl(IgG+IgM)   Babesia microti Antibody Panel   Ehrlichia Antibody Panel   Comprehensive metabolic panel   DG Wrist Complete Left   Abnormal TSH       Will recheck given fatigue. Await results.    Relevant Orders   TSH       Follow up plan: Return in about 4 weeks (around 10/20/2015) for DM and wrist pain.

## 2015-09-22 NOTE — Assessment & Plan Note (Signed)
Not under great control. Will continue metformin and add jardiance and recheck on tolerance in 1 month.

## 2015-09-24 ENCOUNTER — Encounter: Payer: Self-pay | Admitting: Family Medicine

## 2015-09-24 LAB — ROCKY MTN SPOTTED FVR ABS PNL(IGG+IGM)
RMSF IGM: 0.37 {index} (ref 0.00–0.89)
RMSF IgG: NEGATIVE

## 2015-09-24 LAB — COMPREHENSIVE METABOLIC PANEL
A/G RATIO: 1.9 (ref 1.2–2.2)
ALBUMIN: 4.5 g/dL (ref 3.5–5.5)
ALK PHOS: 71 IU/L (ref 39–117)
ALT: 25 IU/L (ref 0–44)
AST: 17 IU/L (ref 0–40)
BUN / CREAT RATIO: 15 (ref 9–20)
BUN: 11 mg/dL (ref 6–24)
Bilirubin Total: 0.4 mg/dL (ref 0.0–1.2)
CO2: 22 mmol/L (ref 18–29)
Calcium: 9.2 mg/dL (ref 8.7–10.2)
Chloride: 98 mmol/L (ref 96–106)
Creatinine, Ser: 0.74 mg/dL — ABNORMAL LOW (ref 0.76–1.27)
GFR calc Af Amer: 128 mL/min/{1.73_m2} (ref >59)
GFR, EST NON AFRICAN AMERICAN: 111 mL/min/{1.73_m2} (ref >59)
GLOBULIN, TOTAL: 2.4 g/dL (ref 1.5–4.5)
Glucose: 161 mg/dL — ABNORMAL HIGH (ref 65–99)
POTASSIUM: 4.4 mmol/L (ref 3.5–5.2)
SODIUM: 138 mmol/L (ref 134–144)
Total Protein: 6.9 g/dL (ref 6.0–8.5)

## 2015-09-24 LAB — LYME AB/WESTERN BLOT REFLEX
LYME DISEASE AB, QUANT, IGM: <0.8 index (ref 0.00–0.79)
Lyme IgG/IgM Ab: <0.91 {ISR} (ref 0.00–0.90)

## 2015-09-24 LAB — EHRLICHIA ANTIBODY PANEL
E. CHAFFEENSIS (HME) IGM TITER: NEGATIVE
E.Chaffeensis (HME) IgG: NEGATIVE
HGE IGG TITER: NEGATIVE
HGE IgM Titer: NEGATIVE

## 2015-09-24 LAB — BABESIA MICROTI ANTIBODY PANEL

## 2015-09-24 LAB — TSH: TSH: 0.436 u[IU]/mL — AB (ref 0.450–4.500)

## 2015-10-05 ENCOUNTER — Telehealth: Payer: Self-pay

## 2015-10-05 NOTE — Telephone Encounter (Signed)
Patients wife states that the patient can't take the Jardiance, that it was causing him to become really dehydrated, to wear he was very weak. He has stopped taking the medication.

## 2015-10-05 NOTE — Telephone Encounter (Signed)
Stop medicine. Work on diet. Will discuss other options at his follow up

## 2015-10-22 ENCOUNTER — Ambulatory Visit: Payer: 59 | Admitting: Family Medicine

## 2015-10-29 ENCOUNTER — Ambulatory Visit (INDEPENDENT_AMBULATORY_CARE_PROVIDER_SITE_OTHER): Payer: 59 | Admitting: Family Medicine

## 2015-10-29 ENCOUNTER — Encounter: Payer: Self-pay | Admitting: Family Medicine

## 2015-10-29 VITALS — BP 126/82 | HR 75 | Temp 98.6°F | Wt 268.6 lb

## 2015-10-29 DIAGNOSIS — E1165 Type 2 diabetes mellitus with hyperglycemia: Secondary | ICD-10-CM | POA: Diagnosis not present

## 2015-10-29 DIAGNOSIS — K219 Gastro-esophageal reflux disease without esophagitis: Secondary | ICD-10-CM

## 2015-10-29 DIAGNOSIS — J449 Chronic obstructive pulmonary disease, unspecified: Secondary | ICD-10-CM

## 2015-10-29 MED ORDER — OMEPRAZOLE 40 MG PO CPDR: 40.0000 mg | DELAYED_RELEASE_CAPSULE | ORAL | 12 refills | Status: DC | PRN

## 2015-10-29 MED ORDER — ALBUTEROL SULFATE HFA 108 (90 BASE) MCG/ACT IN AERS: 2.0000 | INHALATION_SPRAY | Freq: Four times a day (QID) | RESPIRATORY_TRACT | 1 refills | Status: DC | PRN

## 2015-10-29 NOTE — Assessment & Plan Note (Signed)
Likely the cause of his back pain. Will restart his omeprazole. If not better in 2 weeks, will start him on carafate. Call with any concerns.

## 2015-10-29 NOTE — Progress Notes (Signed)
BP 126/82 (BP Location: Left Arm, Patient Position: Sitting, Cuff Size: Large)   Pulse 75   Temp 98.6 F (37 C)   Wt 268 lb 9.6 oz (121.8 kg)   SpO2 96%   BMI 38.54 kg/m    Subjective:    Patient ID: Tony Cardenas, male    DOB: Sep 16, 1970, 45 y.o.   MRN: [id number]  HPI: Tony Cardenas is a 45 y.o. male  Chief Complaint  Patient presents with  . Diabetes  . Back Pain  . COPD    Refill on albuterol inhaler   DIABETES Hypoglycemic episodes:no Polydipsia/polyuria: no Visual disturbance: no Chest pain: no Paresthesias: no Glucose Monitoring: yes  Accucheck frequency: Daily  Fasting glucose: 120-150s Taking Insulin?: no Blood Pressure Monitoring: not checking Retinal Examination: Up to Date Foot Exam: Up to Date Diabetic Education: Completed Pneumovax: Up to Date Influenza: Up to Date Aspirin: no   BACK PAIN Duration: Couple of months Mechanism of injury: unknown Location: midline and upper back Onset: sudden Severity: severe Quality: aching and intense Frequency: waxing and wanes Radiation: none Aggravating factors: not eating  Alleviating factors: eating Status: stable Treatments attempted: none  Relief with NSAIDs?: No NSAIDs Taken Nighttime pain:  no Paresthesias / decreased sensation:  no Bowel / bladder incontinence:  no Fevers:  no Dysuria / urinary frequency:  no  COPD- exposed to silica at work, now having some trouble with his breathing. Stable, well controlled on his albuterol COPD status: stable Satisfied with current treatment?: yes Oxygen use: no Dyspnea frequency: 1-2x a week Cough frequency: rarely Rescue inhaler frequency:   Limitation of activity: no Productive cough: no Pneumovax: Up to Date Influenza: Up to Date  Relevant past medical, surgical, family and social history reviewed and updated as indicated. Interim medical history since our last visit reviewed. Allergies and medications reviewed and  updated.  Review of Systems  Constitutional: Negative.   Respiratory: Negative.   Cardiovascular: Negative.   Musculoskeletal: Positive for back pain and myalgias. Negative for arthralgias, gait problem, joint swelling, neck pain and neck stiffness.  Psychiatric/Behavioral: Negative.     Per HPI unless specifically indicated above     Objective:    BP 126/82 (BP Location: Left Arm, Patient Position: Sitting, Cuff Size: Large)   Pulse 75   Temp 98.6 F (37 C)   Wt 268 lb 9.6 oz (121.8 kg)   SpO2 96%   BMI 38.54 kg/m   Wt Readings from Last 3 Encounters:  10/29/15 268 lb 9.6 oz (121.8 kg)  09/22/15 267 lb (121.1 kg)  07/27/15 263 lb (119.3 kg)    Physical Exam  Constitutional: He is oriented to person, place, and time. He appears well-developed and well-nourished. No distress.  HENT:  Head: Normocephalic and atraumatic.  Right Ear: Hearing normal.  Left Ear: Hearing normal.  Nose: Nose normal.  Eyes: Conjunctivae and lids are normal. Right eye exhibits no discharge. Left eye exhibits no discharge. No scleral icterus.  Cardiovascular: Normal rate, regular rhythm, normal heart sounds and intact distal pulses.  Exam reveals no gallop and no friction rub.   No murmur heard. Pulmonary/Chest: Effort normal and breath sounds normal. No respiratory distress. He has no wheezes. He has no rales. He exhibits no tenderness.  Musculoskeletal: Normal range of motion.  Neurological: He is alert and oriented to person, place, and time.  Skin: Skin is warm, dry and intact. No rash noted. No erythema. No pallor.  Psychiatric: He has a normal mood and  affect. His speech is normal and behavior is normal. Judgment and thought content normal. Cognition and memory are normal.  Nursing note and vitals reviewed.   Results for orders placed or performed in visit on 09/22/15  Bayer DCA Hb A1c Waived  Result Value Ref Range   Bayer DCA Hb A1c Waived 8.1 (H) <7.0 %  Lyme Ab/Western Blot Reflex   Result Value Ref Range   Lyme IgG/IgM Ab <0.91 0.00 - 0.90 ISR   LYME DISEASE AB, QUANT, IGM <0.80 0.00 - 0.79 index  Rocky mtn spotted fvr abs pnl(IgG+IgM)  Result Value Ref Range   RMSF IgG Negative Negative   RMSF IgM 0.37 0.00 - 0.89 index  Babesia microti Antibody Panel  Result Value Ref Range   Babesia microti IgM <1:10 Neg:<1:10   Babesia microti IgG [id number]  Ehrlichia Antibody Panel  Result Value Ref Range   E.Chaffeensis (HME) IgG Negative Neg:<1:64   E. Chaffeensis (HME) IgM Titer Negative Neg:<1:20   HGE IgG Titer Negative Neg:<1:64   HGE IgM Titer Negative Neg:<1:20  Comprehensive metabolic panel  Result Value Ref Range   Glucose 161 (H) 65 - 99 mg/dL   BUN 11 6 - 24 mg/dL   Creatinine, Ser 0.74 (L) 0.76 - 1.27 mg/dL   GFR calc non Af Amer 111 >59 mL/min/1.73   GFR calc Af Amer 128 >59 mL/min/1.73   BUN/Creatinine Ratio 15 9 - 20   Sodium 138 134 - 144 mmol/L   Potassium 4.4 3.5 - 5.2 mmol/L   Chloride 98 96 - 106 mmol/L   CO2 22 18 - 29 mmol/L   Calcium 9.2 8.7 - 10.2 mg/dL   Total Protein 6.9 6.0 - 8.5 g/dL   Albumin 4.5 3.5 - 5.5 g/dL   Globulin, Total 2.4 1.5 - 4.5 g/dL   Albumin/Globulin Ratio 1.9 1.2 - 2.2   Bilirubin Total 0.4 0.0 - 1.2 mg/dL   Alkaline Phosphatase 71 39 - 117 IU/L   AST 17 0 - 40 IU/L   ALT 25 0 - 44 IU/L  TSH  Result Value Ref Range   TSH 0.436 (L) 0.450 - 4.500 uIU/mL  Hemoglobin A1c  Result Value Ref Range   Hemoglobin A1C 8.1       Assessment & Plan:   Problem List Items Addressed This Visit      Respiratory   COPD (chronic obstructive pulmonary disease) (HCC)    Stable. Needs refill on his albuterol- given today. Continue to monitor.      Relevant Medications   albuterol (PROVENTIL HFA;VENTOLIN HFA) 108 (90 Cardenas) MCG/ACT inhaler     Digestive   GERD (gastroesophageal reflux disease)    Likely the cause of his back pain. Will restart his omeprazole. If not better in 2 weeks, will start him on carafate. Call  with any concerns.       Relevant Medications   omeprazole (PRILOSEC) 40 MG capsule     Endocrine   Type 2 diabetes mellitus with hyperglycemia ([place]) - Primary    Doing well with his jardiance. Checking CMP today. Continue to monitor. Call with any concerns.       Relevant Orders   Comprehensive metabolic panel    Other Visit Diagnoses   None.      Follow up plan: Return in about 2 months (around 12/29/2015) for DM viist.

## 2015-10-29 NOTE — Assessment & Plan Note (Signed)
Doing well with his jardiance. Checking CMP today. Continue to monitor. Call with any concerns.

## 2015-10-29 NOTE — Assessment & Plan Note (Signed)
Stable. Needs refill on his albuterol- given today. Continue to monitor.

## 2015-10-30 ENCOUNTER — Encounter: Payer: Self-pay | Admitting: Family Medicine

## 2015-10-30 LAB — COMPREHENSIVE METABOLIC PANEL
ALBUMIN: 4.6 g/dL (ref 3.5–5.5)
ALK PHOS: 65 IU/L (ref 39–117)
ALT: 25 IU/L (ref 0–44)
AST: 20 IU/L (ref 0–40)
Albumin/Globulin Ratio: 2 (ref 1.2–2.2)
BILIRUBIN TOTAL: 0.4 mg/dL (ref 0.0–1.2)
BUN / CREAT RATIO: 17 (ref 9–20)
BUN: 13 mg/dL (ref 6–24)
CHLORIDE: 100 mmol/L (ref 96–106)
CO2: 21 mmol/L (ref 18–29)
CREATININE: 0.76 mg/dL (ref 0.76–1.27)
Calcium: 9.2 mg/dL (ref 8.7–10.2)
GFR calc Af Amer: 126 mL/min/{1.73_m2} (ref >59)
GFR calc non Af Amer: 109 mL/min/{1.73_m2} (ref >59)
GLUCOSE: 186 mg/dL — AB (ref 65–99)
Globulin, Total: 2.3 g/dL (ref 1.5–4.5)
Potassium: 4.3 mmol/L (ref 3.5–5.2)
Sodium: 139 mmol/L (ref 134–144)
Total Protein: 6.9 g/dL (ref 6.0–8.5)

## 2015-11-04 ENCOUNTER — Other Ambulatory Visit: Payer: Self-pay

## 2015-11-04 NOTE — Patient Outreach (Signed)
[facility]) Care Management  11/04/2015  Merel Karaman [facility] March 19, 1970 [id number]   Spoke to patient today as a follow up in the Link to Wellness diabetes program at [facility].  [name] admits to stopping the Jardiance because it was causing " pain in my back" and was "making me feel dehydrated, I couldn't get enough to drink".  Patient tells me he is still taking Metformin 1000 mg bid .  He reports that he is checking CBG 1x/day and fasting blood sugars are 120-130mg /dl and he has "never had anything higher than 150 mg/dl".   A1C on 09/22/15 when Jardiance was started was 8.1%.   He tells me he hasn't had any hypoglycemia and tells me he's no longer drinking regular sodas or drinking sweet tea.  I have encouraged [name] to sign up for [employer], the new platform for Link to Wellness for 2018.  Please consider trying him on another agent to help improve blood sugar control.    Gentry Fitz, RN, BA, [place], [place] Direct Dial:  [phone]  Fax:  [phone] E-mail: [email or web address] [address], [place], [place]  [address]

## 2015-11-05 ENCOUNTER — Telehealth: Payer: Self-pay | Admitting: Family Medicine

## 2015-11-05 NOTE — Telephone Encounter (Signed)
-----   Message from Pollyann Glen, RN sent at 11/04/2015  2:07 PM EDT ----- Patient did not tolerate Jardience so he stopped.  A1C 8.1%

## 2015-11-05 NOTE — Telephone Encounter (Signed)
Called patient. Jardiance made him feel like he was dehydrated and made him dizzy so he stopped it. He does not want to add anything right now and wants to watch his diet. That's OK. We will recheck him at his follow up.

## 2015-11-17 ENCOUNTER — Telehealth: Payer: Self-pay | Admitting: Family Medicine

## 2015-11-17 NOTE — Telephone Encounter (Signed)
Called [name] to check in on his back pain and if it's better on his omeprazole. If not better, will start him on carafate. LMOM. Await call back.

## 2015-11-17 NOTE — Telephone Encounter (Signed)
-----   Message from Valerie Roys, DO sent at 10/29/2015  8:52 AM EDT ----- Call regarding back pain/GERD

## 2015-11-25 MED ORDER — SUCRALFATE 1 GM/10ML PO SUSP: 1.0000 g | Freq: Three times a day (TID) | ORAL | 0 refills | Status: DC

## 2015-11-25 NOTE — Telephone Encounter (Signed)
Called to check in with [place]. He is feeling about the same. Rx for carafate sent to his pharmacy. He will call if not doing better next week and we will get him into GI

## 2015-11-30 ENCOUNTER — Other Ambulatory Visit: Payer: Self-pay | Admitting: Family Medicine

## 2015-12-15 NOTE — Patient Outreach (Signed)
[facility]) Care Management  12/15/2015  Zahn Shaak [facility] 11-Jul-1970 [id number]   Letter to patient - he does not have a scheduled appointment with MD or myself.  To schedule with MD for A1C.  He has registered in [facility] [place].   Gentry Fitz, RN, BA, [place], [place] Direct Dial:  [phone]  Fax:  [phone] E-mail: [email or web address] [address], [place], [place]  [address]

## 2015-12-22 ENCOUNTER — Other Ambulatory Visit: Payer: Self-pay

## 2015-12-22 NOTE — Patient Outreach (Signed)
[facility]) Care Management  12/22/2015  Tony Cardenas [facility] 1970/03/15 [id number]   I have sent an e-mail to [facility] in regards to his recent blood sugar control. I have asked him to report his last 2 weeks of blood sugars.    Gentry Fitz, RN, BA, [place], [place] Direct Dial:  [phone]  Fax:  [phone] E-mail: [name] [address], [place], [place]  [address]

## 2015-12-25 ENCOUNTER — Other Ambulatory Visit: Payer: Self-pay | Admitting: Family Medicine

## 2015-12-28 ENCOUNTER — Ambulatory Visit: Payer: Self-pay | Admitting: Family Medicine

## 2016-01-05 ENCOUNTER — Encounter: Payer: Self-pay | Admitting: Family Medicine

## 2016-01-05 ENCOUNTER — Ambulatory Visit (INDEPENDENT_AMBULATORY_CARE_PROVIDER_SITE_OTHER): Payer: 59 | Admitting: Family Medicine

## 2016-01-05 VITALS — BP 128/81 | HR 80 | Temp 98.2°F | Wt 273.0 lb

## 2016-01-05 DIAGNOSIS — T63301A Toxic effect of unspecified spider venom, accidental (unintentional), initial encounter: Secondary | ICD-10-CM | POA: Diagnosis not present

## 2016-01-05 MED ORDER — SULFAMETHOXAZOLE-TRIMETHOPRIM 800-160 MG PO TABS: 1.0000 | ORAL_TABLET | Freq: Two times a day (BID) | ORAL | 0 refills | Status: DC
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
## 2016-01-05 NOTE — Patient Instructions (Signed)
Follow up in 1 week if no improvement

## 2016-01-25 ENCOUNTER — Ambulatory Visit: Payer: Self-pay | Admitting: Family Medicine

## 2016-02-03 ENCOUNTER — Telehealth: Payer: Self-pay | Admitting: Family Medicine

## 2016-02-03 MED ORDER — SUCRALFATE 1 G PO TABS: 1.0000 g | ORAL_TABLET | Freq: Three times a day (TID) | ORAL | 3 refills | Status: DC

## 2016-02-03 NOTE — Telephone Encounter (Signed)
Carafate suspension too expensive. Rx for pills sent to his pharmacy

## 2016-02-11 ENCOUNTER — Encounter: Payer: Self-pay | Admitting: Family Medicine

## 2016-02-11 ENCOUNTER — Ambulatory Visit (INDEPENDENT_AMBULATORY_CARE_PROVIDER_SITE_OTHER): Payer: 59 | Admitting: Family Medicine

## 2016-02-11 VITALS — BP 144/69 | HR 82 | Temp 98.0°F | Ht 71.0 in | Wt 271.4 lb

## 2016-02-11 DIAGNOSIS — R1013 Epigastric pain: Secondary | ICD-10-CM

## 2016-02-11 DIAGNOSIS — E782 Mixed hyperlipidemia: Secondary | ICD-10-CM | POA: Diagnosis not present

## 2016-02-11 DIAGNOSIS — E1165 Type 2 diabetes mellitus with hyperglycemia: Secondary | ICD-10-CM

## 2016-02-11 DIAGNOSIS — M25561 Pain in right knee: Secondary | ICD-10-CM | POA: Diagnosis not present

## 2016-02-11 LAB — BAYER DCA HB A1C WAIVED: HB A1C: 7.8 % — AB (ref <7.0)

## 2016-02-11 MED ORDER — LIRAGLUTIDE 18 MG/3ML ~~LOC~~ SOPN: PEN_INJECTOR | SUBCUTANEOUS | 4 refills | Status: DC

## 2016-02-11 MED ORDER — ATORVASTATIN CALCIUM 40 MG PO TABS: 40.0000 mg | ORAL_TABLET | Freq: Every day | ORAL | 1 refills | Status: DC

## 2016-02-11 NOTE — Assessment & Plan Note (Signed)
Stopped all medicine. Will get him back on his medicine. Call with any concerns. Recheck 3 months.

## 2016-02-11 NOTE — Progress Notes (Signed)
BP (!) 144/69 (BP Location: Left Arm, Patient Position: Sitting, Cuff Size: Large)   Pulse 82   Temp 98 F (36.7 C)   Ht 5\' 11"  (1.803 m)   Wt 271 lb 6.4 oz (123.1 kg)   SpO2 96%   BMI 37.85 kg/m    Subjective:    Patient ID: Tony Cardenas, male    DOB: [birth year], 46 y.o.   MRN: [id number]  HPI: Tony Cardenas is a 46 y.o. male  Chief Complaint  Patient presents with  . Diabetes   DIABETES- had a stomach ulcer so stopped all his medicine. He notes  Hypoglycemic episodes:no Polydipsia/polyuria: yes Visual disturbance: no Chest pain: no Paresthesias: no Glucose Monitoring: yes  Accucheck frequency: Daily  Fasting glucose: 210 Taking Insulin?: no Blood Pressure Monitoring: not checking Retinal Examination: Up to Date Foot Exam: Up to Date Diabetic Education: Completed Pneumovax: Up to Date Influenza: Up to Date Aspirin: no  KNEE PAIN- tore his ACL when he was a kid and had it repaired in early 2000s Duration: few months Involved knee: right Mechanism of injury: trauma Location:anterior Onset: sudden Severity: 5-6/10  Quality:  burning Frequency: intermittent 1x a month Radiation: no Aggravating factors: walking, running, stairs and bending  Alleviating factors: ice, brace and rest  Status: fluctuating Treatments attempted: brace, rest and ice  Relief with NSAIDs?:  moderate Weakness with weight bearing or walking: yes Sensation of giving way: yes Locking: no Popping: yes Bruising: no Swelling: yes Redness: no Paresthesias/decreased sensation: no Fevers: no  Relevant past medical, surgical, family and social history reviewed and updated as indicated. Interim medical history since our last visit reviewed. Allergies and medications reviewed and updated.  Review of Systems  Constitutional: Negative.   Respiratory: Negative.   Cardiovascular: Negative.   Gastrointestinal: Positive for abdominal distention, abdominal pain, diarrhea  and nausea. Negative for anal bleeding, blood in stool, constipation, rectal pain and vomiting.  Musculoskeletal: Positive for arthralgias, gait problem and joint swelling. Negative for back pain, myalgias, neck pain and neck stiffness.  Psychiatric/Behavioral: Negative.     Per HPI unless specifically indicated above     Objective:    BP (!) 144/69 (BP Location: Left Arm, Patient Position: Sitting, Cuff Size: Large)   Pulse 82   Temp 98 F (36.7 C)   Ht 5\' 11"  (1.803 m)   Wt 271 lb 6.4 oz (123.1 kg)   SpO2 96%   BMI 37.85 kg/m   Wt Readings from Last 3 Encounters:  02/11/16 271 lb 6.4 oz (123.1 kg)  01/05/16 273 lb (123.8 kg)  10/29/15 268 lb 9.6 oz (121.8 kg)    Physical Exam  Constitutional: He is oriented to person, place, and time. He appears well-developed and well-nourished. No distress.  HENT:  Head: Normocephalic and atraumatic.  Right Ear: Hearing normal.  Left Ear: Hearing normal.  Nose: Nose normal.  Eyes: Conjunctivae and lids are normal. Right eye exhibits no discharge. Left eye exhibits no discharge. No scleral icterus.  Cardiovascular: Normal rate, regular rhythm, normal heart sounds and intact distal pulses.  Exam reveals no gallop and no friction rub.   No murmur heard. Pulmonary/Chest: Effort normal and breath sounds normal. No respiratory distress. He has no wheezes. He has no rales. He exhibits no tenderness.  Musculoskeletal: Normal range of motion.  Neurological: He is alert and oriented to person, place, and time.  Skin: Skin is warm, dry and intact. No rash noted. He is not diaphoretic. No erythema. No pallor.  Psychiatric: He has a normal mood and affect. His speech is normal and behavior is normal. Judgment and thought content normal. Cognition and memory are normal.  Nursing note and vitals reviewed.   Results for orders placed or performed in visit on 10/29/15  Comprehensive metabolic panel  Result Value Ref Range   Glucose 186 (H) 65 - 99  mg/dL   BUN 13 6 - 24 mg/dL   Creatinine, Ser 0.76 0.76 - 1.27 mg/dL   GFR calc non Af Amer 109 >59 mL/min/1.73   GFR calc Af Amer 126 >59 mL/min/1.73   BUN/Creatinine Ratio 17 9 - 20   Sodium 139 134 - 144 mmol/L   Potassium 4.3 3.5 - 5.2 mmol/L   Chloride 100 96 - 106 mmol/L   CO2 21 18 - 29 mmol/L   Calcium 9.2 8.7 - 10.2 mg/dL   Total Protein 6.9 6.0 - 8.5 g/dL   Albumin 4.6 3.5 - 5.5 g/dL   Globulin, Total 2.3 1.5 - 4.5 g/dL   Albumin/Globulin Ratio 2.0 1.2 - 2.2   Bilirubin Total 0.4 0.0 - 1.2 mg/dL   Alkaline Phosphatase 65 39 - 117 IU/L   AST 20 0 - 40 IU/L   ALT 25 0 - 44 IU/L      Assessment & Plan:   Problem List Items Addressed This Visit      Endocrine   Type 2 diabetes mellitus with hyperglycemia ([facility]) - Primary   Relevant Medications   atorvastatin (LIPITOR) 40 MG tablet   liraglutide 18 MG/3ML SOPN   Other Relevant Orders   Bayer DCA Hb A1c Waived   Comprehensive metabolic panel     Other   Hyperlipidemia    Stopped all medicine. Will get him back on his medicine. Call with any concerns. Recheck 3 months.       Relevant Medications   atorvastatin (LIPITOR) 40 MG tablet    Other Visit Diagnoses    Epigastric pain       Relevant Orders   Ambulatory referral to Gastroenterology   Acute pain of right knee       Relevant Orders   DG Knee Complete 4 Views Right       Follow up plan: Return in about 4 weeks (around 03/10/2016).

## 2016-02-12 ENCOUNTER — Encounter: Payer: Self-pay | Admitting: Family Medicine

## 2016-02-12 LAB — COMPREHENSIVE METABOLIC PANEL
A/G RATIO: 1.7 (ref 1.2–2.2)
ALBUMIN: 4.4 g/dL (ref 3.5–5.5)
ALT: 28 IU/L (ref 0–44)
AST: 20 IU/L (ref 0–40)
Alkaline Phosphatase: 64 IU/L (ref 39–117)
BILIRUBIN TOTAL: 0.2 mg/dL (ref 0.0–1.2)
BUN / CREAT RATIO: 14 (ref 9–20)
BUN: 12 mg/dL (ref 6–24)
CALCIUM: 9.2 mg/dL (ref 8.7–10.2)
CHLORIDE: 99 mmol/L (ref 96–106)
CO2: 18 mmol/L (ref 18–29)
Creatinine, Ser: 0.87 mg/dL (ref 0.76–1.27)
GFR, EST AFRICAN AMERICAN: 119 mL/min/{1.73_m2} (ref >59)
GFR, EST NON AFRICAN AMERICAN: 103 mL/min/{1.73_m2} (ref >59)
Globulin, Total: 2.6 g/dL (ref 1.5–4.5)
Glucose: 233 mg/dL — ABNORMAL HIGH (ref 65–99)
POTASSIUM: 4.6 mmol/L (ref 3.5–5.2)
Sodium: 140 mmol/L (ref 134–144)
Total Protein: 7 g/dL (ref 6.0–8.5)

## 2016-03-10 ENCOUNTER — Ambulatory Visit: Payer: 59 | Admitting: Family Medicine

## 2016-03-14 ENCOUNTER — Telehealth: Payer: Self-pay | Admitting: Family Medicine

## 2016-03-14 ENCOUNTER — Ambulatory Visit: Payer: 59 | Admitting: Gastroenterology

## 2016-03-14 DIAGNOSIS — R39198 Other difficulties with micturition: Secondary | ICD-10-CM

## 2016-03-14 NOTE — Telephone Encounter (Signed)
Ms Tony Cardenas called and stated that the pt didn't go to GI because he feels like he needs to go to urology. He had been drinking lots of fluids over the weekend but having a hard time urinating and thinks it may be kidney stones. Pts wife would like to know if he can get the referral without an appt since he was recently seen in the office.

## 2016-03-15 NOTE — Telephone Encounter (Signed)
Spoke with wife. She will make an appointment at [facility].

## 2016-03-15 NOTE — Telephone Encounter (Signed)
Order is in. Wife can make appointment whenever she'd like. Thanks!

## 2016-03-22 ENCOUNTER — Ambulatory Visit: Payer: 59 | Admitting: Urology

## 2016-03-30 ENCOUNTER — Ambulatory Visit: Payer: 59 | Admitting: Urology
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
## 2016-04-06 ENCOUNTER — Ambulatory Visit: Payer: 59 | Admitting: Urology

## 2016-04-21 ENCOUNTER — Ambulatory Visit: Payer: 59 | Admitting: Gastroenterology

## 2016-05-16 NOTE — Progress Notes (Signed)
05/17/2016 4:58 PM   Tony Cardenas [facility] May 08, 1970 [id number]  Referring provider: Valerie Roys, DO [place], [place] [address]  Chief Complaint  Patient presents with  . New Patient (Initial Visit)    difficulty urinating referred by Dr. Park Liter    HPI: Patient is a 46 year old Caucasian male who is referred by Dr. Valerie Roys for difficulty urinating.  He states That he has been having bilateral flank pain for the last 6-8 months. He states the pain is worse after he eats. He states drinking water helps ease the pain. He describes the pain as stabbing sensation reaching levels of 10 out of 10.  He states on average the pain is 5 to 6 and is described as a pressure like feeling.   He denies any gross hematuria, but he states his primary care physician has told him that he has had microscopic hematuria.    He states when the pain started in his bilateral flank area, that is when the urinary symptom started.  His IPSS score today is 15, which is moderate lower urinary tract symptomatology.  He is mostly satisfied with his quality life due to his urinary symptoms. His PVR is 50 mL.  His major complaints today are frequency, urgency, nocturia, intermittency, blood (microscopic) in the urine and a weak urinary stream.   He has had these symptoms for 6 to 8 months.  He denies any dysuria, hematuria or suprapubic pain.   He also denies any recent fevers, chills, nausea or vomiting.  He does not have a family history of PCa.  Reviewing his records, he did have a trace of blood on a dip almost 1 year ago.   No gross hematuria.  His UA today is clear.        IPSS    Row Name 05/17/16 1600         International Prostate Symptom Score   How often have you had the sensation of not emptying your bladder? About half the time     How often have you had to urinate less than every two hours? More than half the time     How often have you found you stopped and started  again several times when you urinated? Not at All     How often have you found it difficult to postpone urination? Less than half the time     How often have you had a weak urinary stream? About half the time     How often have you had to strain to start urination? Less than 1 in 5 times     How many times did you typically get up at night to urinate? 2 Times     Total IPSS Score 15       Quality of Life due to urinary symptoms   If you were to spend the rest of your life with your urinary condition just the way it is now how would you feel about that? Mostly Satisfied        Score:  1-7 Mild 8-19 Moderate 20-35 Severe    PMH: Past Medical History:  Diagnosis Date  . Arthritis    NECK AND RIGHT KNEE  . COPD (chronic obstructive pulmonary disease) ([place])   . Diabetes mellitus without complication ([place])   . GERD (gastroesophageal reflux disease)   . Hyperlipidemia   . Neck pain 1989   BROKEN NECK IN PAST/C1-2/ [employer]  Surgical History: Past Surgical History:  Procedure Laterality Date  . APPENDECTOMY    . COLONOSCOPY N/A 06/02/2014   Procedure: COLONOSCOPY;  Surgeon: Lucilla Lame, MD;  Location: [place];  Service: Gastroenterology;  Laterality: N/A;  . HERNIA REPAIR  over 10 years ago   umbilical-repaired [place] Right    TORN ACL  . NECK SURGERY  1989   C4-5 RUPTURED DISC  . POLYPECTOMY  06/02/2014   Procedure: POLYPECTOMY INTESTINAL;  Surgeon: Lucilla Lame, MD;  Location: [place];  Service: Gastroenterology;;    Home Medications:  Allergies as of 05/17/2016   No Known Allergies     Medication List       Accurate as of 05/17/16  4:58 PM. Always use your most recent med list.          albuterol 108 (90 Base) MCG/ACT inhaler Commonly known as:  PROVENTIL HFA;VENTOLIN HFA Inhale 2 puffs into the lungs every 6 (six) hours as needed for wheezing or shortness of breath.   atorvastatin 40 MG  tablet Commonly known as:  LIPITOR Take 1 tablet (40 mg total) by mouth daily.   blood glucose meter kit and supplies Kit Dispense based on patient and insurance preference. Use up to four times daily as directed. (FOR ICD-9 250.00, 250.01).   fluticasone 50 MCG/ACT nasal spray Commonly known as:  FLONASE Place 2 sprays into both nostrils daily. PM   GOODSENSE IBUPROFEN 200 MG tablet Generic drug:  ibuprofen Take by mouth.   liraglutide 18 MG/3ML Sopn 0.73m  for 1 week, then 1.284mC for 1 week, then 1.63m15m after that   loratadine 10 MG tablet Commonly known as:  CLARITIN Take 1 tablet (10 mg total) by mouth daily as needed for allergies.   metformin 1000 MG (OSM) 24 hr tablet Commonly known as:  FORTAMET TAKE 1 TABLET BY MOUTH 2 TIMES DAILY.   naproxen 500 MG tablet Commonly known as:  NAPROSYN TAKE 1 TABLET BY MOUTH 2 TIMES DAILY WITH A MEAL.   omeprazole 40 MG capsule Commonly known as:  PRILOSEC Take 1 capsule (40 mg total) by mouth as needed. PM   sucralfate 1 g tablet Commonly known as:  CARAFATE Take 1 tablet (1 g total) by mouth 4 (four) times daily -  with meals and at bedtime.       Allergies: No Known Allergies  Family History: Family History  Problem Relation Age of Onset  . Diabetes Father   . Alcohol abuse Father   . COPD Father   . Diabetes Brother   . Diabetes Paternal Grandmother   . Cancer Mother   . Kidney cancer Neg Hx   . Prostate cancer Neg Hx   . Bladder Cancer Neg Hx     Social History:  reports that he quit smoking about 11 months ago. His smoking use included Cigarettes. He has a 20.00 pack-year smoking history. He has quit using smokeless tobacco. His smokeless tobacco use included Chew. He reports that he does not drink alcohol or use drugs.  ROS: UROLOGY Frequent Urination?: Yes Hard to postpone urination?: Yes Burning/pain with urination?: No Get up at night to urinate?: Yes Leakage of urine?: No Urine stream starts and  stops?: Yes Trouble starting stream?: No Do you have to strain to urinate?: No Blood in urine?: Yes Urinary tract infection?: No Sexually transmitted disease?: No Injury to kidneys or bladder?: No Painful intercourse?: No Weak stream?: Yes Erection problems?: Yes Penile pain?: No  Gastrointestinal Nausea?: No Vomiting?: No Indigestion/heartburn?: No Diarrhea?: No Constipation?: No  Constitutional Fever: No Night sweats?: No Weight loss?: No Fatigue?: No  Skin Skin rash/lesions?: No Itching?: No  Eyes Blurred vision?: No Double vision?: No  Ears/Nose/Throat Sore throat?: No Sinus problems?: No  Hematologic/Lymphatic Swollen glands?: No Easy bruising?: No  Cardiovascular Leg swelling?: No Chest pain?: No  Respiratory Cough?: No Shortness of breath?: No  Endocrine Excessive thirst?: No  Musculoskeletal Back pain?: Yes Joint pain?: No  Neurological Headaches?: No Dizziness?: No  Psychologic Depression?: No Anxiety?: No  Physical Exam: BP 137/77   Pulse 94   Ht 5' 10"  (1.778 m)   Wt 263 lb 1.6 oz (119.3 kg)   BMI 37.75 kg/m   Constitutional: Well nourished. Alert and oriented, No acute distress. HEENT: [place] AT, moist mucus membranes. Trachea midline, no masses. Cardiovascular: No clubbing, cyanosis, or edema. Respiratory: Normal respiratory effort, no increased work of breathing. GI: Abdomen is soft, non tender, non distended, no abdominal masses. Liver and spleen not palpable.  No hernias appreciated.  Stool sample for occult testing is not indicated.   GU: No CVA tenderness.  No bladder fullness or masses.  Patient with circumcised phallus.  Urethral meatus is patent.  No penile discharge. No penile lesions or rashes. Scrotum without lesions, cysts, rashes and/or edema.  Testicles are located scrotally bilaterally. No masses are appreciated in the testicles. Left and right epididymis are normal. Rectal: Patient with  normal sphincter tone.  Anus and perineum without scarring or rashes. No rectal masses are appreciated. Prostate is approximately 45 grams, no nodules are appreciated. Seminal vesicles are normal. Skin: No rashes, bruises or suspicious lesions. Lymph: No cervical or inguinal adenopathy. Neurologic: Grossly intact, no focal deficits, moving all 4 extremities. Psychiatric: Normal mood and affect.  Laboratory Data: Lab Results  Component Value Date   WBC 11.7 (H) 06/22/2015   HGB 15.9 02/11/2013   HCT 44.6 06/22/2015   MCV 90 06/22/2015   PLT 368 06/22/2015    Lab Results  Component Value Date   CREATININE 0.87 02/11/2016     Lab Results  Component Value Date   HGBA1C 8.1 09/22/2015    Lab Results  Component Value Date   TSH 0.436 (L) 09/22/2015       Component Value Date/Time   CHOL WILL FOLLOW 06/22/2015 1408   CHOL 176 06/22/2015 1408   HDL 26 (L) 06/22/2015 1408   VLDL WILL FOLLOW 06/22/2015 1408   [place] Comment 06/22/2015 1408    Lab Results  Component Value Date   AST 20 02/11/2016   Lab Results  Component Value Date   ALT 28 02/11/2016    Urinalysis Negative for hematuria.   See EPIC  Pertinent Imaging: Results for ZAMIRE, WHITEHURST (MRN [id number]) as of 05/17/2016 16:48  Ref. Range 05/17/2016 16:11  Scan Result Unknown 50    Assessment & Plan:    1. Bilateral flank pain  - associated with urinary symptoms ? Stone as he has a history of nephrolithiasis  - obtain a CT Renal stone study for further investigation into his symptoms - patient is not taking his DM meds due to the pain  - Advised to contact our office or seek treatment in the ED if becomes febrile or pain/ vomiting are difficult control in order to arrange for emergent/urgent intervention  2. LU TS  - may be due to uncontrolled DM, but will obtain CT Renal stone study to rule out ureteral stones  3.  Uncontrolled DM/abnormal UA  - 3 + glucose on UA dip  - patient is anxious about restarting his meds as  he feels it exacerbates the pain  - will evaluate for an urological source for the pain  Return for RTC for CT Renal stone study.  These notes generated with voice recognition software. I apologize for typographical errors.  Zara Council, [facility] [address], [place], [place] [address] [phone]

## 2016-05-17 ENCOUNTER — Encounter: Payer: Self-pay | Admitting: Urology

## 2016-05-17 ENCOUNTER — Other Ambulatory Visit: Payer: Self-pay

## 2016-05-17 ENCOUNTER — Ambulatory Visit (INDEPENDENT_AMBULATORY_CARE_PROVIDER_SITE_OTHER): Payer: 59 | Admitting: Urology

## 2016-05-17 VITALS — BP 137/77 | HR 94 | Ht 70.0 in | Wt 263.1 lb

## 2016-05-17 DIAGNOSIS — R109 Unspecified abdominal pain: Secondary | ICD-10-CM

## 2016-05-17 DIAGNOSIS — R8290 Unspecified abnormal findings in urine: Secondary | ICD-10-CM

## 2016-05-17 DIAGNOSIS — R39198 Other difficulties with micturition: Secondary | ICD-10-CM | POA: Diagnosis not present

## 2016-05-17 DIAGNOSIS — R399 Unspecified symptoms and signs involving the genitourinary system: Secondary | ICD-10-CM | POA: Diagnosis not present

## 2016-05-17 DIAGNOSIS — R3129 Other microscopic hematuria: Secondary | ICD-10-CM | POA: Diagnosis not present

## 2016-05-17 LAB — BLADDER SCAN AMB NON-IMAGING: Scan Result: 50

## 2016-05-18 ENCOUNTER — Ambulatory Visit: Payer: 59 | Admitting: Gastroenterology

## 2016-05-18 LAB — URINALYSIS, COMPLETE
BILIRUBIN UA: NEGATIVE
KETONES UA: NEGATIVE
Leukocytes, UA: NEGATIVE
NITRITE UA: NEGATIVE
Protein, UA: NEGATIVE
RBC UA: NEGATIVE
Specific Gravity, UA: 1.025 (ref 1.005–1.030)
UUROB: 0.2 mg/dL (ref 0.2–1.0)
pH, UA: 5.5 (ref 5.0–7.5)

## 2016-06-01 ENCOUNTER — Ambulatory Visit
Admission: RE | Admit: 2016-06-01 | Discharge: 2016-06-01 | Disposition: A | Discharge status: Home / Self Care | Payer: 59 | Source: Ambulatory Visit | Attending: Urology | Admitting: Urology

## 2016-06-01 DIAGNOSIS — K760 Fatty (change of) liver, not elsewhere classified: Secondary | ICD-10-CM | POA: Diagnosis not present

## 2016-06-01 DIAGNOSIS — R109 Unspecified abdominal pain: Secondary | ICD-10-CM | POA: Diagnosis not present

## 2016-06-01 DIAGNOSIS — Z9049 Acquired absence of other specified parts of digestive tract: Secondary | ICD-10-CM | POA: Diagnosis not present

## 2016-06-07 NOTE — Progress Notes (Signed)
06/08/2016 4:09 PM   Tony Cardenas [facility] 05-Jan-1971 [id number]  Referring provider: Valerie Roys, DO [place], [place] [address]  Chief Complaint  Patient presents with  . Follow-up    HPI: 46 yo WM who presents today to discuss his CT Renal stone study.  Background history Patient is a 46 year old Caucasian male who is referred by Dr. Valerie Roys for difficulty urinating.  He states That he has been having bilateral flank pain for the last 6-8 months. He states the pain is worse after he eats. He states drinking water helps ease the pain. He describes the pain as stabbing sensation reaching levels of 10 out of 10.  He states on average the pain is 5 to 6 and is described as a pressure like feeling.   He denies any gross hematuria, but he states his primary care physician has told him that he has had microscopic hematuria.  He states when the pain started in his bilateral flank area, that is when the urinary symptom started.  His IPSS score today is 15, which is moderate lower urinary tract symptomatology.  He is mostly satisfied with his quality life due to his urinary symptoms. His PVR is 50 mL.  His major complaints today are frequency, urgency, nocturia, intermittency, blood (microscopic) in the urine and a weak urinary stream.   He has had these symptoms for 6 to 8 months.  He denies any dysuria, hematuria or suprapubic pain.   He also denies any recent fevers, chills, nausea or vomiting.  He does not have a family history of PCa.  Reviewing his records, he did have a trace of blood on a dip almost 1 year ago.   No gross hematuria.  His UA today is clear.        IPSS    Row Name 05/17/16 1600         International Prostate Symptom Score   How often have you had the sensation of not emptying your bladder? About half the time     How often have you had to urinate less than every two hours? More than half the time     How often have you found you stopped and  started again several times when you urinated? Not at All     How often have you found it difficult to postpone urination? Less than half the time     How often have you had a weak urinary stream? About half the time     How often have you had to strain to start urination? Less than 1 in 5 times     How many times did you typically get up at night to urinate? 2 Times     Total IPSS Score 15       Quality of Life due to urinary symptoms   If you were to spend the rest of your life with your urinary condition just the way it is now how would you feel about that? Mostly Satisfied        Score:  1-7 Mild 8-19 Moderate 20-35 Severe  CT Renal stone study performed on 06/01/2016 noted no renal, ureteral or bladder calculi or mass.  Diffuse and fairly marked fatty infiltration of the liver.  Status post cholecystectomy.  No biliary dilatation.  No acute abdominal/pelvic findings, mass lesions or Lymphadenopathy.  I have independently reviewed the films.  Today, he is still experiencing frequency, nocturia, intermittency and a weak  urinary stream.  He denies dysuria, gross hematuria or suprapubic pain. He denies any fevers, chills, nausea or vomiting.     PMH: Past Medical History:  Diagnosis Date  . Arthritis    NECK AND RIGHT KNEE  . COPD (chronic obstructive pulmonary disease) ([place])   . Diabetes mellitus without complication ([place])   . GERD (gastroesophageal reflux disease)   . Hyperlipidemia   . Neck pain [place] PAST/C1-2/ [employer]    Surgical History: Past Surgical History:  Procedure Laterality Date  . APPENDECTOMY    . COLONOSCOPY N/A 06/02/2014   Procedure: COLONOSCOPY;  Surgeon: Lucilla Lame, MD;  Location: [place];  Service: Gastroenterology;  Laterality: N/A;  . HERNIA REPAIR  over 10 years ago   umbilical-repaired [place] Right    TORN ACL  . NECK SURGERY  1989   C4-5 RUPTURED DISC  . POLYPECTOMY  06/02/2014    Procedure: POLYPECTOMY INTESTINAL;  Surgeon: Lucilla Lame, MD;  Location: [place];  Service: Gastroenterology;;    Home Medications:  Allergies as of 06/08/2016   No Known Allergies     Medication List       Accurate as of 06/08/16  4:09 PM. Always use your most recent med list.          albuterol 108 (90 Base) MCG/ACT inhaler Commonly known as:  PROVENTIL HFA;VENTOLIN HFA Inhale 2 puffs into the lungs every 6 (six) hours as needed for wheezing or shortness of breath.   atorvastatin 40 MG tablet Commonly known as:  LIPITOR Take 1 tablet (40 mg total) by mouth daily.   blood glucose meter kit and supplies Kit Dispense based on patient and insurance preference. Use up to four times daily as directed. (FOR ICD-9 250.00, 250.01).   fluticasone 50 MCG/ACT nasal spray Commonly known as:  FLONASE Place 2 sprays into both nostrils daily. PM   GOODSENSE IBUPROFEN 200 MG tablet Generic drug:  ibuprofen Take by mouth.   liraglutide 18 MG/3ML Sopn 0.44m [place] for 1 week, then 1.244mC for 1 week, then 1.72m63m after that   loratadine 10 MG tablet Commonly known as:  CLARITIN Take 1 tablet (10 mg total) by mouth daily as needed for allergies.   metformin 1000 MG (OSM) 24 hr tablet Commonly known as:  FORTAMET TAKE 1 TABLET BY MOUTH 2 TIMES DAILY.   naproxen 500 MG tablet Commonly known as:  NAPROSYN TAKE 1 TABLET BY MOUTH 2 TIMES DAILY WITH A MEAL.   omeprazole 40 MG capsule Commonly known as:  PRILOSEC Take 1 capsule (40 mg total) by mouth as needed. PM   sucralfate 1 g tablet Commonly known as:  CARAFATE Take 1 tablet (1 g total) by mouth 4 (four) times daily -  with meals and at bedtime.       Allergies: No Known Allergies  Family History: Family History  Problem Relation Age of Onset  . Diabetes Father   . Alcohol abuse Father   . COPD Father   . Diabetes Brother   . Diabetes Paternal Grandmother   . Cancer Mother   . Kidney cancer Neg Hx   .  Prostate cancer Neg Hx   . Bladder Cancer Neg Hx     Social History:  reports that he quit smoking about a year ago. His smoking use included Cigarettes. He has a 20.00 pack-year smoking history. He has quit using smokeless tobacco. His smokeless tobacco use included Chew. He  reports that he does not drink alcohol or use drugs.  ROS: UROLOGY Frequent Urination?: Yes Hard to postpone urination?: No Burning/pain with urination?: No Get up at night to urinate?: Yes Leakage of urine?: No Urine stream starts and stops?: No Trouble starting stream?: Yes Do you have to strain to urinate?: No Blood in urine?: No Urinary tract infection?: No Sexually transmitted disease?: No Injury to kidneys or bladder?: No Painful intercourse?: No Weak stream?: Yes Erection problems?: No Penile pain?: No  Gastrointestinal Nausea?: No Vomiting?: No Indigestion/heartburn?: No Diarrhea?: No Constipation?: No  Constitutional Fever: No Night sweats?: Yes Weight loss?: No Fatigue?: Yes  Skin Skin rash/lesions?: No Itching?: No  Eyes Blurred vision?: Yes Double vision?: No  Ears/Nose/Throat Sore throat?: No Sinus problems?: No  Hematologic/Lymphatic Swollen glands?: No Easy bruising?: No  Cardiovascular Leg swelling?: No Chest pain?: Yes  Respiratory Cough?: No Shortness of breath?: Yes  Endocrine Excessive thirst?: No  Musculoskeletal Back pain?: Yes Joint pain?: No  Neurological Headaches?: No Dizziness?: No  Psychologic Depression?: No Anxiety?: No  Physical Exam: BP 133/85 (BP Location: Left Arm, Patient Position: Sitting, Cuff Size: Large)   Pulse 86   Ht _0  (1.778 m)   Wt 265 lb 9.6 oz (120.5 kg)   BMI 38.11 kg/m   Constitutional: Well nourished. Alert and oriented, No acute distress. HEENT: [place] AT, moist mucus membranes. Trachea midline, no masses. Cardiovascular: No clubbing, cyanosis, or edema. Respiratory: Normal respiratory effort, no increased  work of breathing. Skin: No rashes, bruises or suspicious lesions. Lymph: No cervical or inguinal adenopathy. Neurologic: Grossly intact, no focal deficits, moving all 4 extremities. Psychiatric: Normal mood and affect.  Laboratory Data: Lab Results  Component Value Date   WBC 11.7 (H) 06/22/2015   HGB 15.9 02/11/2013   HCT 44.6 06/22/2015   MCV 90 06/22/2015   PLT 368 06/22/2015    Lab Results  Component Value Date   CREATININE 0.87 02/11/2016     Lab Results  Component Value Date   HGBA1C 8.1 09/22/2015    Lab Results  Component Value Date   TSH 0.436 (L) 09/22/2015       Component Value Date/Time   CHOL WILL FOLLOW 06/22/2015 1408   CHOL 176 06/22/2015 1408   HDL 26 (L) 06/22/2015 1408   VLDL WILL FOLLOW 06/22/2015 1408   [place] Comment 06/22/2015 1408    Lab Results  Component Value Date   AST 20 02/11/2016   Lab Results  Component Value Date   ALT 28 02/11/2016     Pertinent Imaging: CLINICAL DATA:  Worsening bilateral flank pain. Microscopic hematuria. History of kidney stones.  EXAM: CT ABDOMEN AND PELVIS WITHOUT CONTRAST  TECHNIQUE: Multidetector CT imaging of the abdomen and pelvis was performed following the standard protocol without IV contrast.  COMPARISON:  CT scans from 2006  FINDINGS: Lower chest: The lung bases are clear of acute process. No pleural effusion or pulmonary lesions. The heart is normal in size. No pericardial effusion. The distal esophagus and aorta are unremarkable.  Hepatobiliary: Diffuse fatty infiltration of the liver. No focal hepatic lesions or intrahepatic biliary dilatation. The gallbladder is surgically absent. No common bile duct dilatation.  Pancreas: No mass, inflammation or ductal dilatation.  Spleen: Normal size.  No focal lesions.  Adrenals/Urinary Tract: The adrenal glands are normal.  No renal, ureteral or bladder calculi or mass.  Stomach/Bowel: The stomach, duodenum, small  bowel and colon are grossly normal without oral contrast. No inflammatory changes, mass lesions  or obstructive findings. The terminal ileum and appendix are normal.  Vascular/Lymphatic: No significant vascular findings are present. Minimal scattered aortic calcifications. No enlarged abdominal or pelvic lymph nodes.  Reproductive: The prostate gland and seminal vesicles are unremarkable.  Other: No pelvic mass or adenopathy. No free pelvic fluid collections. No inguinal mass or adenopathy. No abdominal wall hernia or subcutaneous lesions.  Musculoskeletal: No acute bony findings. Bilateral pars defects are noted at L5 with a grade 1 spondylolisthesis and associated degenerative disc disease at L5-S1.  IMPRESSION: 1. No renal, ureteral or bladder calculi or mass. 2. Diffuse and fairly marked fatty infiltration of the liver. 3. Status post cholecystectomy.  No biliary dilatation. 4. No acute abdominal/pelvic findings, mass lesions or lymphadenopathy.   Electronically Signed   By: Marijo Sanes M.D.   On: 06/02/2016 08:20    Assessment & Plan:    1. Bilateral flank pain  - No urological etiology discovered on CT scan for the flank pain  2. LU TS  - may be due to uncontrolled DM - once patient is stable on DM meds we can reassess if symptoms are persisting  3. Uncontrolled DM/abnormal UA  - patient is anxious about restarting his meds as he feels it exacerbates the pain  - no urological source for the pain was discovered - patient will contact Dr. Wynetta Emery for further evaluation  Return if symptoms worsen or fail to improve.  These notes generated with voice recognition software. I apologize for typographical errors.  Zara Council, [facility] [address], [place], [place] [address] [phone]

## 2016-06-08 ENCOUNTER — Ambulatory Visit (INDEPENDENT_AMBULATORY_CARE_PROVIDER_SITE_OTHER): Payer: 59 | Admitting: Urology

## 2016-06-08 ENCOUNTER — Encounter: Payer: Self-pay | Admitting: Urology

## 2016-06-08 VITALS — BP 133/85 | HR 86 | Ht 70.0 in | Wt 265.6 lb

## 2016-06-08 DIAGNOSIS — R399 Unspecified symptoms and signs involving the genitourinary system: Secondary | ICD-10-CM | POA: Diagnosis not present

## 2016-06-08 DIAGNOSIS — R109 Unspecified abdominal pain: Secondary | ICD-10-CM | POA: Diagnosis not present

## 2016-06-21 ENCOUNTER — Ambulatory Visit: Payer: 59 | Admitting: Family Medicine

## 2016-06-22 ENCOUNTER — Ambulatory Visit
Admission: RE | Admit: 2016-06-22 | Discharge: 2016-06-22 | Disposition: A | Discharge status: Home / Self Care | Payer: 59 | Source: Ambulatory Visit | Attending: Family Medicine | Admitting: Family Medicine

## 2016-06-22 ENCOUNTER — Encounter: Payer: Self-pay | Admitting: Family Medicine

## 2016-06-22 ENCOUNTER — Ambulatory Visit: Admission: RE | Admit: 2016-06-22 | Payer: 59 | Source: Ambulatory Visit

## 2016-06-22 ENCOUNTER — Ambulatory Visit (INDEPENDENT_AMBULATORY_CARE_PROVIDER_SITE_OTHER): Payer: 59 | Admitting: Family Medicine

## 2016-06-22 VITALS — BP 142/86 | HR 81 | Temp 97.8°F | Wt 263.6 lb

## 2016-06-22 DIAGNOSIS — M1711 Unilateral primary osteoarthritis, right knee: Secondary | ICD-10-CM | POA: Diagnosis not present

## 2016-06-22 DIAGNOSIS — J449 Chronic obstructive pulmonary disease, unspecified: Secondary | ICD-10-CM | POA: Diagnosis not present

## 2016-06-22 DIAGNOSIS — M25561 Pain in right knee: Secondary | ICD-10-CM | POA: Diagnosis not present

## 2016-06-22 DIAGNOSIS — E1165 Type 2 diabetes mellitus with hyperglycemia: Secondary | ICD-10-CM | POA: Diagnosis not present

## 2016-06-22 DIAGNOSIS — Z113 Encounter for screening for infections with a predominantly sexual mode of transmission: Secondary | ICD-10-CM

## 2016-06-22 DIAGNOSIS — E782 Mixed hyperlipidemia: Secondary | ICD-10-CM

## 2016-06-22 LAB — UA/M W/RFLX CULTURE, ROUTINE
BILIRUBIN UA: NEGATIVE
Leukocytes, UA: NEGATIVE
Nitrite, UA: NEGATIVE
PH UA: 5.5 (ref 5.0–7.5)
PROTEIN UA: NEGATIVE
RBC UA: NEGATIVE
Specific Gravity, UA: 1.02 (ref 1.005–1.030)
UUROB: 0.2 mg/dL (ref 0.2–1.0)

## 2016-06-22 LAB — MICROALBUMIN, URINE WAIVED
CREATININE, URINE WAIVED: 300 mg/dL (ref 10–300)
Microalb, Ur Waived: 80 mg/L — ABNORMAL HIGH (ref 0–19)

## 2016-06-22 LAB — MICROSCOPIC EXAMINATION: BACTERIA UA: NONE SEEN

## 2016-06-22 LAB — BAYER DCA HB A1C WAIVED: HB A1C (BAYER DCA - WAIVED): 8.2 % — ABNORMAL HIGH (ref <7.0)

## 2016-06-22 MED ORDER — LISINOPRIL 5 MG PO TABS: 5.0000 mg | ORAL_TABLET | Freq: Every day | ORAL | 3 refills | Status: DC

## 2016-06-22 MED ORDER — METFORMIN HCL ER 500 MG PO TB24: 1000.0000 mg | ORAL_TABLET | Freq: Every day | ORAL | 1 refills | Status: DC

## 2016-06-22 NOTE — Assessment & Plan Note (Signed)
Rechecking levels today. Await results. Call with any concerns.  

## 2016-06-22 NOTE — Assessment & Plan Note (Signed)
Stable. Continue to monitor. Call with any concerns.  ?

## 2016-06-22 NOTE — Progress Notes (Signed)
BP (!) 142/86 (BP Location: Left Arm, Patient Position: Sitting, Cuff Size: Large)   Pulse 81   Temp 97.8 F (36.6 C)   Wt 263 lb 9.6 oz (119.6 kg)   SpO2 97%   BMI 37.82 kg/m    Subjective:    Patient ID: Tony Cardenas, male    DOB: Sep 01, 1970, 46 y.o.   MRN: [id number]  HPI: Tony Cardenas is a 46 y.o. male  Chief Complaint  Patient presents with  . Diabetes  . Knee Pain    right   KNEE PAIN- got better after last time, but then started with pain again after he had to work in the mud and in a ditch. He knows that he hasn't hurt it, but he he notes that it's more aching right now.  Duration: 3-4 weeks Involved knee: right Mechanism of injury: trauma Location:anterior Onset: gradual Severity: moderate to severe  Quality:  sharp Frequency: constant Radiation: no Aggravating factors: weight bearing, walking, running, stairs, bending and movement  Alleviating factors: brace  Status: stable Treatments attempted: rest, ice, heat and aleve  Relief with NSAIDs?:  no Weakness with weight bearing or walking: yes Sensation of giving way: yes Locking: no Popping: yes Bruising: no Swelling: yes Redness: no Paresthesias/decreased sensation: no Fevers: no  DIABETES- stopped taking all his medicine Hypoglycemic episodes:no Polydipsia/polyuria: yes Visual disturbance: no Chest pain: no Paresthesias: no Glucose Monitoring: no Taking Insulin?: no Blood Pressure Monitoring: not checking Retinal Examination: Not up to Date Foot Exam: Up to Date Diabetic Education: Completed Pneumovax: Up to Date Influenza: Up to Date Aspirin: yes  HYPERLIPIDEMIA Hyperlipidemia status: excellent compliance Satisfied with current treatment?  yes Side effects:  no Medication compliance: excellent compliance Past cholesterol meds: atorvastain (lipitor) Supplements: none Aspirin:  no Chest pain:  no Coronary artery disease:  no Family history CAD:  yes  Relevant  past medical, surgical, family and social history reviewed and updated as indicated. Interim medical history since our last visit reviewed. Allergies and medications reviewed and updated.  Review of Systems  Constitutional: Negative.   Respiratory: Negative.   Cardiovascular: Negative.   Musculoskeletal: Positive for arthralgias and gait problem. Negative for back pain, joint swelling, myalgias, neck pain and neck stiffness.  Psychiatric/Behavioral: Negative.     Per HPI unless specifically indicated above     Objective:    BP (!) 142/86 (BP Location: Left Arm, Patient Position: Sitting, Cuff Size: Large)   Pulse 81   Temp 97.8 F (36.6 C)   Wt 263 lb 9.6 oz (119.6 kg)   SpO2 97%   BMI 37.82 kg/m   Wt Readings from Last 3 Encounters:  06/22/16 263 lb 9.6 oz (119.6 kg)  06/08/16 265 lb 9.6 oz (120.5 kg)  05/17/16 263 lb 1.6 oz (119.3 kg)    Physical Exam  Constitutional: He is oriented to person, place, and time. He appears well-developed and well-nourished. No distress.  HENT:  Head: Normocephalic and atraumatic.  Right Ear: Hearing normal.  Left Ear: Hearing normal.  Nose: Nose normal.  Eyes: Conjunctivae and lids are normal. Right eye exhibits no discharge. Left eye exhibits no discharge. No scleral icterus.  Cardiovascular: Normal rate, regular rhythm, normal heart sounds and intact distal pulses.  Exam reveals no gallop and no friction rub.   No murmur heard. Pulmonary/Chest: Effort normal and breath sounds normal. No respiratory distress. He has no wheezes. He has no rales. He exhibits no tenderness.  Musculoskeletal: Normal range of motion.  Neurological:  He is alert and oriented to person, place, and time.  Skin: Skin is warm, dry and intact. No rash noted. He is not diaphoretic. No erythema. No pallor.  Psychiatric: He has a normal mood and affect. His speech is normal and behavior is normal. Judgment and thought content normal. Cognition and memory are normal.    Nursing note and vitals reviewed.   Results for orders placed or performed in visit on 05/17/16  Urinalysis, Complete  Result Value Ref Range   Specific Gravity, UA 1.025 1.005 - 1.030   pH, UA 5.5 5.0 - 7.5   Color, UA Yellow Yellow   Appearance Ur Clear Clear   Leukocytes, UA Negative Negative   Protein, UA Negative Negative/Trace   Glucose, UA 3+ (A) Negative   Ketones, UA Negative Negative   RBC, UA Negative Negative   Bilirubin, UA Negative Negative   Urobilinogen, Ur 0.2 0.2 - 1.0 mg/dL   Nitrite, UA Negative Negative  BLADDER SCAN AMB NON-IMAGING  Result Value Ref Range   Scan Result 50       Assessment & Plan:   Problem List Items Addressed This Visit      Respiratory   COPD (chronic obstructive pulmonary disease) (HCC)    Stable. Continue to monitor. Call with any concerns.         Endocrine   Type 2 diabetes mellitus with hyperglycemia ([place]) - Primary    Uncontrolled. Will get him back on his metformin, hold on victoza right now and recheck 3 months. Will start lisinopril given microalbuminuria on exam today.      Relevant Medications   metFORMIN (GLUCOPHAGE-XR) 500 MG 24 hr tablet   lisinopril (PRINIVIL,ZESTRIL) 5 MG tablet   Other Relevant Orders   Bayer DCA Hb A1c Waived   Comprehensive metabolic panel   Microalbumin, Urine Waived   UA/M w/rflx Culture, Routine     Other   Hyperlipidemia    Rechecking levels today. Await results. Call with any concerns.       Relevant Medications   lisinopril (PRINIVIL,ZESTRIL) 5 MG tablet   Other Relevant Orders   Lipid Panel w/o Chol/HDL Ratio    Other Visit Diagnoses    Screening for STD (sexually transmitted disease)       HIV drawn today. No concerns.    Relevant Orders   HIV antibody   Acute pain of right knee       Likely arthritis. Will obtain x-ray. Has failed naproxen. Will give him steroid shot in knee tomorrow if x-ray negative for mass.        Follow up plan: Return Tomorrow, for Steroid  shot R Knee.

## 2016-06-22 NOTE — Assessment & Plan Note (Signed)
Uncontrolled. Will get him back on his metformin, hold on victoza right now and recheck 3 months. Will start lisinopril given microalbuminuria on exam today.

## 2016-06-23 ENCOUNTER — Ambulatory Visit (INDEPENDENT_AMBULATORY_CARE_PROVIDER_SITE_OTHER): Payer: 59 | Admitting: Family Medicine

## 2016-06-23 ENCOUNTER — Telehealth: Payer: Self-pay | Admitting: Family Medicine

## 2016-06-23 ENCOUNTER — Encounter: Payer: Self-pay | Admitting: Family Medicine

## 2016-06-23 VITALS — BP 149/88 | HR 107 | Temp 98.6°F | Wt 263.7 lb

## 2016-06-23 DIAGNOSIS — M1711 Unilateral primary osteoarthritis, right knee: Secondary | ICD-10-CM | POA: Diagnosis not present

## 2016-06-23 DIAGNOSIS — E782 Mixed hyperlipidemia: Secondary | ICD-10-CM

## 2016-06-23 LAB — COMPREHENSIVE METABOLIC PANEL
ALBUMIN: 4.6 g/dL (ref 3.5–5.5)
ALK PHOS: 68 IU/L (ref 39–117)
ALT: 37 IU/L (ref 0–44)
AST: 33 IU/L (ref 0–40)
Albumin/Globulin Ratio: 1.8 (ref 1.2–2.2)
BUN/Creatinine Ratio: 14 (ref 9–20)
BUN: 10 mg/dL (ref 6–24)
Bilirubin Total: 0.3 mg/dL (ref 0.0–1.2)
CHLORIDE: 101 mmol/L (ref 96–106)
CO2: 21 mmol/L (ref 20–29)
CREATININE: 0.7 mg/dL — AB (ref 0.76–1.27)
Calcium: 9.4 mg/dL (ref 8.7–10.2)
GFR calc Af Amer: 130 mL/min/{1.73_m2} (ref >59)
GFR calc non Af Amer: 112 mL/min/{1.73_m2} (ref >59)
GLUCOSE: 172 mg/dL — AB (ref 65–99)
Globulin, Total: 2.6 g/dL (ref 1.5–4.5)
Potassium: 4.1 mmol/L (ref 3.5–5.2)
Sodium: 139 mmol/L (ref 134–144)
Total Protein: 7.2 g/dL (ref 6.0–8.5)

## 2016-06-23 LAB — HIV ANTIBODY (ROUTINE TESTING W REFLEX): HIV Screen 4th Generation wRfx: NONREACTIVE

## 2016-06-23 LAB — LIPID PANEL W/O CHOL/HDL RATIO
CHOLESTEROL TOTAL: 218 mg/dL — AB (ref 100–199)
HDL: 24 mg/dL — AB (ref >39)
Triglycerides: 1122 mg/dL (ref 0–149)

## 2016-06-23 NOTE — Telephone Encounter (Signed)
Discussed results with patient. Cholesterol quite high- Will recheck labs when he is fasting. Labs ordered today. Remainder of labs normal.

## 2016-06-23 NOTE — Progress Notes (Signed)
Procedure: Right  Knee Intraarticular Steroid Injection        Diagnosis:   ICD-10-CM   1. 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  Patient elected to proceed and verbal consent obtained.  Description: Area prepped and draped using  semi-sterile technique.  Using a anterior/lateral approach, a mixture of 4 cc of  1% lidocaine & 1 cc of Kenalog 40 was injected into knee joint.  A bandage was then placed over the injection site. Complications: none Post Procedure Instructions: Wound care instructions discussed and patient was instructed to keep area clean and dry.  Signs and symptoms of infection discussed, patient agrees to contact the office ASAP should they occur.  Follow Up: Return for Diabetes follow up.

## 2016-07-14 ENCOUNTER — Ambulatory Visit (INDEPENDENT_AMBULATORY_CARE_PROVIDER_SITE_OTHER): Payer: 59 | Admitting: Gastroenterology

## 2016-07-14 ENCOUNTER — Encounter: Payer: Self-pay | Admitting: Gastroenterology

## 2016-07-14 ENCOUNTER — Other Ambulatory Visit: Payer: Self-pay

## 2016-07-14 VITALS — BP 133/69 | HR 83 | Temp 98.2°F | Ht 70.0 in | Wt 262.0 lb

## 2016-07-14 DIAGNOSIS — G8929 Other chronic pain: Secondary | ICD-10-CM

## 2016-07-14 DIAGNOSIS — K219 Gastro-esophageal reflux disease without esophagitis: Secondary | ICD-10-CM

## 2016-07-14 DIAGNOSIS — R1013 Epigastric pain: Principal | ICD-10-CM

## 2016-07-14 MED ORDER — SUCRALFATE 1 GM/10ML PO SUSP: 1.0000 g | Freq: Four times a day (QID) | ORAL | 2 refills | Status: DC

## 2016-07-14 NOTE — Progress Notes (Signed)
Primary Care Physician: Valerie Roys, DO  Primary Gastroenterologist:  Dr. Lucilla Lame  Chief Complaint  Patient presents with  . Abdominal Pain    HPI: Tony Cardenas is a 46 y.o. male here For reflux. The patient states that he has pain in his back that started when he started taking metformin approximately 1 year ago. The patient has stopped the metformin but was still having symptoms. The patient was then started on Carafate and states he is devoid of any symptoms at the present time. The patient is worried that he may have peptic ulcer disease of the cause of his back pain. There is no report of any unexplained weight loss fevers chills nausea vomiting. The patient also denies any black stools or bloody stools. The patient is not due for colonoscopy until 2021 and had his last colonoscopy 2016.  Current Outpatient Prescriptions  Medication Sig Dispense Refill  . atorvastatin (LIPITOR) 40 MG tablet Take 1 tablet (40 mg total) by mouth daily. 90 tablet 1  . blood glucose meter kit and supplies KIT Dispense based on patient and insurance preference. Use up to four times daily as directed. (FOR ICD-9 250.00, 250.01). 1 each 0  . fluticasone (FLONASE) 50 MCG/ACT nasal spray Place 2 sprays into both nostrils daily. PM 16 g 12  . ibuprofen (GOODSENSE IBUPROFEN) 200 MG tablet Take by mouth.    Marland Kitchen lisinopril (PRINIVIL,ZESTRIL) 5 MG tablet Take 1 tablet (5 mg total) by mouth daily. 30 tablet 3  . loratadine (CLARITIN) 10 MG tablet Take 1 tablet (10 mg total) by mouth daily as needed for allergies. 30 tablet 12  . omeprazole (PRILOSEC) 40 MG capsule Take 1 capsule (40 mg total) by mouth as needed. PM 30 capsule 12  . sucralfate (CARAFATE) 1 g tablet Take 1 tablet (1 g total) by mouth 4 (four) times daily -  with meals and at bedtime. 90 tablet 3  . albuterol (PROVENTIL HFA;VENTOLIN HFA) 108 (90 Base) MCG/ACT inhaler Inhale 2 puffs into the lungs every 6 (six) hours as needed for  wheezing or shortness of breath. (Patient not taking: Reported on 07/14/2016) 1 Inhaler 1  . metFORMIN (GLUCOPHAGE-XR) 500 MG 24 hr tablet Take 2 tablets (1,000 mg total) by mouth daily with breakfast. (Patient not taking: Reported on 07/14/2016) 360 tablet 1   No current facility-administered medications for this visit.     Allergies as of 07/14/2016  . (No Known Allergies)    ROS:  General: Negative for anorexia, weight loss, fever, chills, fatigue, weakness. ENT: Negative for hoarseness, difficulty swallowing , nasal congestion. CV: Negative for chest pain, angina, palpitations, dyspnea on exertion, peripheral edema.  Respiratory: Negative for dyspnea at rest, dyspnea on exertion, cough, sputum, wheezing.  GI: See history of present illness. GU:  Negative for dysuria, hematuria, urinary incontinence, urinary frequency, nocturnal urination.  Endo: Negative for unusual weight change.    Physical Examination:   BP 133/69   Pulse 83   Temp 98.2 F (36.8 C) (Oral)   Ht _0  (1.778 m)   Wt 262 lb (118.8 kg)   BMI 37.59 kg/m   General: Well-nourished, well-developed in no acute distress.  Eyes: No icterus. Conjunctivae pink. Mouth: Oropharyngeal mucosa moist and pink , no lesions erythema or exudate. Lungs: Clear to auscultation bilaterally. Non-labored. Heart: Regular rate and rhythm, no murmurs rubs or gallops.  Abdomen: Bowel sounds are normal, nontender, nondistended, no hepatosplenomegaly or masses, no abdominal bruits or hernia , no rebound  or guarding.   Extremities: No lower extremity edema. No clubbing or deformities. Neuro: Alert and oriented x 3.  Grossly intact. Skin: Warm and dry, no jaundice.   Psych: Alert and cooperative, normal mood and affect.  Labs:    Imaging Studies: Dg Knee Complete 4 Views Right  Result Date: 06/22/2016 CLINICAL DATA:  Chronic generalized pain. EXAM: RIGHT KNEE - COMPLETE 4+ VIEW COMPARISON:  Knee MRI 07/09/2005. FINDINGS:  Tricompartmental joint space narrowing, particularly severe medially and in the patellofemoral region. Single screw in the femur post ACL repair. Soft tissue calcifications. No effusion. IMPRESSION: Severe degenerative change. Electronically Signed   By: Staci Righter M.D.   On: 06/22/2016 13:35    Assessment and Plan:   Tony Cardenas is a 46 y.o. y/o male who comes in today with back pain that has been helped with Carafate. The patient may be suffering from peptic ulcer disease and will be set up for a upper endoscopy. The patient will continue his omeprazole and his Carafate. I have discussed risks & benefits which include, but are not limited to, bleeding, infection, perforation & drug reaction.  The patient agrees with this plan & written consent will be obtained.       Lucilla Lame, MD. Marval Regal   Note: This dictation was prepared with Dragon dictation along with smaller phrase technology. Any transcriptional errors that result from this process are unintentional.

## 2016-07-28 NOTE — Discharge Instructions (Signed)
General Anesthesia, Adult, Care After °These instructions provide you with information about caring for yourself after your procedure. Your health care provider may also give you more specific instructions. Your treatment has been planned according to current medical practices, but problems sometimes occur. Call your health care provider if you have any problems or questions after your procedure. °What can I expect after the procedure? °After the procedure, it is common to have: °· Vomiting. °· A sore throat. °· Mental slowness. ° °It is common to feel: °· Nauseous. °· Cold or shivery. °· Sleepy. °· Tired. °· Sore or achy, even in parts of your body where you did not have surgery. ° °Follow these instructions at home: °For at least 24 hours after the procedure: °· Do not: °? Participate in activities where you could fall or become injured. °? Drive. °? Use heavy machinery. °? Drink alcohol. °? Take sleeping pills or medicines that cause drowsiness. °? Make important decisions or sign legal documents. °? Take care of children on your own. °· Rest. °Eating and drinking °· If you vomit, drink water, juice, or soup when you can drink without vomiting. °· Drink enough fluid to keep your urine clear or pale yellow. °· Make sure you have little or no nausea before eating solid foods. °· Follow the diet recommended by your health care provider. °General instructions °· Have a responsible adult stay with you until you are awake and alert. °· Return to your normal activities as told by your health care provider. Ask your health care provider what activities are safe for you. °· Take over-the-counter and prescription medicines only as told by your health care provider. °· If you smoke, do not smoke without supervision. °· Keep all follow-up visits as told by your health care provider. This is important. °Contact a health care provider if: °· You continue to have nausea or vomiting at home, and medicines are not helpful. °· You  cannot drink fluids or start eating again. °· You cannot urinate after 8-12 hours. °· You develop a skin rash. °· You have fever. °· You have increasing redness at the site of your procedure. °Get help right away if: °· You have difficulty breathing. °· You have chest pain. °· You have unexpected bleeding. °· You feel that you are having a life-threatening or urgent problem. °This information is not intended to replace advice given to you by your health care provider. Make sure you discuss any questions you have with your health care provider. °Document Released: 04/04/2000 Document Revised: 06/01/2015 Document Reviewed: 12/11/2014 °Elsevier Interactive Patient Education © 2018 [employer] ° °

## 2016-07-29 ENCOUNTER — Encounter
Admission: RE | Disposition: A | Discharge status: Home / Self Care | Payer: Self-pay | Source: Ambulatory Visit | Attending: Gastroenterology

## 2016-07-29 ENCOUNTER — Ambulatory Visit
Admission: RE | Admit: 2016-07-29 | Discharge: 2016-07-29 | Disposition: A | Discharge status: Home / Self Care | Payer: 59 | Source: Ambulatory Visit | Attending: Gastroenterology | Admitting: Gastroenterology

## 2016-07-29 ENCOUNTER — Ambulatory Visit: Payer: 59 | Admitting: Anesthesiology

## 2016-07-29 DIAGNOSIS — M47812 Spondylosis without myelopathy or radiculopathy, cervical region: Secondary | ICD-10-CM | POA: Insufficient documentation

## 2016-07-29 DIAGNOSIS — R12 Heartburn: Secondary | ICD-10-CM | POA: Insufficient documentation

## 2016-07-29 DIAGNOSIS — E119 Type 2 diabetes mellitus without complications: Secondary | ICD-10-CM | POA: Diagnosis not present

## 2016-07-29 DIAGNOSIS — Z79899 Other long term (current) drug therapy: Secondary | ICD-10-CM | POA: Insufficient documentation

## 2016-07-29 DIAGNOSIS — Z7984 Long term (current) use of oral hypoglycemic drugs: Secondary | ICD-10-CM | POA: Insufficient documentation

## 2016-07-29 DIAGNOSIS — R1013 Epigastric pain: Secondary | ICD-10-CM | POA: Diagnosis not present

## 2016-07-29 DIAGNOSIS — K219 Gastro-esophageal reflux disease without esophagitis: Secondary | ICD-10-CM

## 2016-07-29 DIAGNOSIS — Z87891 Personal history of nicotine dependence: Secondary | ICD-10-CM | POA: Diagnosis not present

## 2016-07-29 DIAGNOSIS — E785 Hyperlipidemia, unspecified: Secondary | ICD-10-CM | POA: Diagnosis not present

## 2016-07-29 DIAGNOSIS — M1711 Unilateral primary osteoarthritis, right knee: Secondary | ICD-10-CM | POA: Diagnosis not present

## 2016-07-29 DIAGNOSIS — J449 Chronic obstructive pulmonary disease, unspecified: Secondary | ICD-10-CM | POA: Insufficient documentation

## 2016-07-29 HISTORY — PX: ESOPHAGOGASTRODUODENOSCOPY (EGD) WITH PROPOFOL: SHX5813

## 2016-07-29 SURGERY — ESOPHAGOGASTRODUODENOSCOPY (EGD) WITH PROPOFOL
Anesthesia: General | Wound class: Clean Contaminated

## 2016-07-29 MED ORDER — PROPOFOL 10 MG/ML IV BOLUS
INTRAVENOUS | Status: DC | PRN
  Administered 2016-07-29: 50 mg via INTRAVENOUS
  Administered 2016-07-29: 100 mg via INTRAVENOUS

## 2016-07-29 MED ORDER — LACTATED RINGERS IV SOLN
INTRAVENOUS | Status: DC
  Administered 2016-07-29: 07:00:00 via INTRAVENOUS

## 2016-07-29 MED ORDER — LIDOCAINE HCL (CARDIAC) 20 MG/ML IV SOLN
INTRAVENOUS | Status: DC | PRN
  Administered 2016-07-29: 50 mg via INTRAVENOUS

## 2016-07-29 MED ORDER — GLYCOPYRROLATE 0.2 MG/ML IJ SOLN
INTRAMUSCULAR | Status: DC | PRN
  Administered 2016-07-29: 0.2 mg via INTRAVENOUS

## 2016-07-29 MED ORDER — ACETAMINOPHEN 160 MG/5ML PO SOLN: 325.0000 mg | ORAL | Status: DC | PRN

## 2016-07-29 MED ORDER — ACETAMINOPHEN 325 MG PO TABS: 325.0000 mg | ORAL_TABLET | ORAL | Status: DC | PRN

## 2016-07-29 SURGICAL SUPPLY — 32 items
BALLN DILATOR 10-12 8 (BALLOONS)
BALLN DILATOR 12-15 8 (BALLOONS)
BALLN DILATOR 15-18 8 (BALLOONS)
BALLN DILATOR CRE 0-12 8 (BALLOONS)
BALLN DILATOR ESOPH 8 10 CRE (MISCELLANEOUS) IMPLANT
BALLOON DILATOR 12-15 8 (BALLOONS) IMPLANT
BALLOON DILATOR 15-18 8 (BALLOONS) IMPLANT
BALLOON DILATOR CRE 0-12 8 (BALLOONS) IMPLANT
BLOCK BITE 60FR ADLT L/F GRN (MISCELLANEOUS) ×3 IMPLANT
CANISTER SUCT 1200ML W/VALVE (MISCELLANEOUS) ×3 IMPLANT
CLIP HMST 235XBRD CATH ROT (MISCELLANEOUS) IMPLANT
CLIP RESOLUTION 360 11X235 (MISCELLANEOUS)
FCP ESCP3.2XJMB 240X2.8X (MISCELLANEOUS)
FORCEPS BIOP RAD 4 LRG CAP 4 (CUTTING FORCEPS) IMPLANT
FORCEPS BIOP RJ4 240 W/NDL (MISCELLANEOUS)
FORCEPS ESCP3.2XJMB 240X2.8X (MISCELLANEOUS) IMPLANT
GOWN CVR UNV OPN BCK APRN NK (MISCELLANEOUS) ×2 IMPLANT
GOWN ISOL THUMB LOOP REG UNIV (MISCELLANEOUS) ×6
INJECTOR VARIJECT VIN23 (MISCELLANEOUS) IMPLANT
KIT DEFENDO VALVE AND CONN (KITS) IMPLANT
KIT ENDO PROCEDURE OLY (KITS) ×3 IMPLANT
MARKER SPOT ENDO TATTOO 5ML (MISCELLANEOUS) IMPLANT
PAD GROUND ADULT SPLIT (MISCELLANEOUS) IMPLANT
RETRIEVER NET PLAT FOOD (MISCELLANEOUS) IMPLANT
SNARE SHORT THROW 13M SML OVAL (MISCELLANEOUS) IMPLANT
SNARE SHORT THROW 30M LRG OVAL (MISCELLANEOUS) IMPLANT
SPOT EX ENDOSCOPIC TATTOO (MISCELLANEOUS)
SYR INFLATION 60ML (SYRINGE) IMPLANT
TRAP ETRAP POLY (MISCELLANEOUS) IMPLANT
VARIJECT INJECTOR VIN23 (MISCELLANEOUS)
WATER STERILE IRR 250ML POUR (IV SOLUTION) ×3 IMPLANT
WIRE CRE 18-20MM 8CM F G (MISCELLANEOUS) IMPLANT

## 2016-07-29 NOTE — Anesthesia Procedure Notes (Signed)
Performed by: Charitie Hinote Pre-anesthesia Checklist: Patient identified, Emergency Drugs available, Suction available, Timeout performed and Patient being monitored Patient Re-evaluated:Patient Re-evaluated prior to induction Oxygen Delivery Method: Nasal cannula Placement Confirmation: positive ETCO2       
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
## 2016-07-29 NOTE — Anesthesia Postprocedure Evaluation (Signed)
Anesthesia Post Note  Patient: Tony Cardenas  Procedure(s) Performed: Procedure(s) (LRB): ESOPHAGOGASTRODUODENOSCOPY (EGD) WITH PROPOFOL (N/A)  Patient location during evaluation: PACU Anesthesia Type: General Level of consciousness: awake and alert and oriented Pain management: satisfactory to patient Vital Signs Assessment: post-procedure vital signs reviewed and stable Respiratory status: spontaneous breathing, nonlabored ventilation and respiratory function stable Cardiovascular status: blood pressure returned to baseline and stable Postop Assessment: Adequate PO intake and No signs of nausea or vomiting Anesthetic complications: no    Raliegh Ip

## 2016-07-29 NOTE — Transfer of Care (Signed)
Immediate Anesthesia Transfer of Care Note  Patient: Tony Cardenas  Procedure(s) Performed: Procedure(s): ESOPHAGOGASTRODUODENOSCOPY (EGD) WITH PROPOFOL (N/A)  Patient Location: PACU  Anesthesia Type: General  Level of Consciousness: awake, alert  and patient cooperative  Airway and Oxygen Therapy: Patient Spontanous Breathing and Patient connected to supplemental oxygen  Post-op Assessment: Post-op Vital signs reviewed, Patient's Cardiovascular Status Stable, Respiratory Function Stable, Patent Airway and No signs of Nausea or vomiting  Post-op Vital Signs: Reviewed and stable  Complications: No apparent anesthesia complications

## 2016-07-29 NOTE — Anesthesia Preprocedure Evaluation (Signed)
Anesthesia Evaluation  Patient identified by MRN, date of birth, ID band Patient awake    Reviewed: Allergy & Precautions, H&P , NPO status , Patient's Chart, lab work & pertinent test results  Airway Mallampati: II  TM Distance: >3 FB Neck ROM: full    Dental no notable dental hx.    Pulmonary COPD, former smoker,    Pulmonary exam normal breath sounds clear to auscultation       Cardiovascular Normal cardiovascular exam Rhythm:regular Rate:Normal     Neuro/Psych    GI/Hepatic GERD  ,  Endo/Other  diabetes  Renal/GU      Musculoskeletal   Abdominal   Peds  Hematology   Anesthesia Other Findings   Reproductive/Obstetrics                             Anesthesia Physical Anesthesia Plan  ASA: II  Anesthesia Plan: General   Post-op Pain Management:    Induction:   PONV Risk Score and Plan: 2 and Propofol  Airway Management Planned:   Additional Equipment:   Intra-op Plan:   Post-operative Plan:   Informed Consent: I have reviewed the patients History and Physical, chart, labs and discussed the procedure including the risks, benefits and alternatives for the proposed anesthesia with the patient or authorized representative who has indicated his/her understanding and acceptance.     Plan Discussed with: CRNA  Anesthesia Plan Comments:         Anesthesia Quick Evaluation

## 2016-07-29 NOTE — Op Note (Signed)
[facility] Gastroenterology Patient Name: Tony Cardenas Procedure Date: 07/29/2016 7:59 AM MRN: [id number] Account #: [id number] Date of Birth: 1970-07-07 Admit Type: Outpatient Age: 46 Room: [facility] OR ROOM 01 Gender: Male Note Status: Finalized Procedure:            Upper GI endoscopy Indications:          Heartburn Providers:            Lucilla Lame MD, MD Referring MD:         Valerie Roys (Referring MD) Medicines:            Propofol per Anesthesia Complications:        No immediate complications. Procedure:            Pre-Anesthesia Assessment:                       - Prior to the procedure, a History and Physical was                        performed, and patient medications and allergies were                        reviewed. The patient's tolerance of previous                        anesthesia was also reviewed. The risks and benefits of                        the procedure and the sedation options and risks were                        discussed with the patient. All questions were                        answered, and informed consent was obtained. Prior                        Anticoagulants: The patient has taken no previous                        anticoagulant or antiplatelet agents. ASA Grade                        Assessment: II - A patient with mild systemic disease.                        After reviewing the risks and benefits, the patient was                        deemed in satisfactory condition to undergo the                        procedure.                       After obtaining informed consent, the endoscope was                        passed under direct vision. Throughout the procedure,  the patient's blood pressure, pulse, and oxygen                        saturations were monitored continuously. The Olympus                        190 Endoscope [id number]) was introduced through the                        mouth,  and advanced to the second part of duodenum. The                        upper GI endoscopy was accomplished without difficulty.                        The patient tolerated the procedure well. Findings:      The Z-line was irregular and was found at the gastroesophageal junction.      The stomach was normal.      The examined duodenum was normal. Impression:           - Z-line irregular, at the gastroesophageal junction.                       - Normal stomach.                       - Normal examined duodenum.                       - No specimens collected. Recommendation:       - Discharge patient to home.                       - Resume previous diet.                       - Continue present medications.                       - Use a proton pump inhibitor PO daily. Procedure Code(s):    --- Professional ---                       [phone], Esophagogastroduodenoscopy, flexible, transoral;                        diagnostic, including collection of specimen(s) by                        brushing or washing, when performed (separate procedure) Diagnosis Code(s):    --- Professional ---                       R12, Heartburn CPT copyright 2016 American Medical Association. All rights reserved. The codes documented in this report are preliminary and upon coder review may  be revised to meet current compliance requirements. Lucilla Lame MD, MD 07/29/2016 8:07:36 AM This report has been signed electronically. Number of Addenda: 0 Note Initiated On: 07/29/2016 7:59 AM      [facility]

## 2016-08-01 ENCOUNTER — Encounter: Payer: Self-pay | Admitting: Gastroenterology

## 2016-08-31 ENCOUNTER — Other Ambulatory Visit: Payer: Self-pay | Admitting: Family Medicine

## 2016-10-13 ENCOUNTER — Encounter: Payer: Self-pay | Admitting: Family Medicine

## 2016-10-13 ENCOUNTER — Ambulatory Visit (INDEPENDENT_AMBULATORY_CARE_PROVIDER_SITE_OTHER): Payer: 59 | Admitting: Family Medicine

## 2016-10-13 VITALS — BP 118/68 | HR 83 | Ht 72.84 in | Wt 258.0 lb

## 2016-10-13 DIAGNOSIS — L920 Granuloma annulare: Secondary | ICD-10-CM | POA: Diagnosis not present

## 2016-10-13 DIAGNOSIS — Z23 Encounter for immunization: Secondary | ICD-10-CM | POA: Diagnosis not present

## 2016-10-13 DIAGNOSIS — E1165 Type 2 diabetes mellitus with hyperglycemia: Secondary | ICD-10-CM | POA: Diagnosis not present

## 2016-10-13 DIAGNOSIS — K219 Gastro-esophageal reflux disease without esophagitis: Secondary | ICD-10-CM

## 2016-10-13 DIAGNOSIS — M1990 Unspecified osteoarthritis, unspecified site: Secondary | ICD-10-CM

## 2016-10-13 DIAGNOSIS — L820 Inflamed seborrheic keratosis: Secondary | ICD-10-CM | POA: Diagnosis not present

## 2016-10-13 DIAGNOSIS — E782 Mixed hyperlipidemia: Secondary | ICD-10-CM | POA: Diagnosis not present

## 2016-10-13 DIAGNOSIS — Z0000 Encounter for general adult medical examination without abnormal findings: Secondary | ICD-10-CM | POA: Diagnosis not present

## 2016-10-13 DIAGNOSIS — I788 Other diseases of capillaries: Secondary | ICD-10-CM | POA: Diagnosis not present

## 2016-10-13 DIAGNOSIS — L821 Other seborrheic keratosis: Secondary | ICD-10-CM | POA: Diagnosis not present

## 2016-10-13 LAB — UA/M W/RFLX CULTURE, ROUTINE
BILIRUBIN UA: NEGATIVE
LEUKOCYTES UA: NEGATIVE
Nitrite, UA: NEGATIVE
PROTEIN UA: NEGATIVE
RBC UA: NEGATIVE
Specific Gravity, UA: 1.025 (ref 1.005–1.030)
UUROB: 0.2 mg/dL (ref 0.2–1.0)
pH, UA: 5.5 (ref 5.0–7.5)

## 2016-10-13 LAB — MICROALBUMIN, URINE WAIVED
CREATININE, URINE WAIVED: 200 mg/dL (ref 10–300)
MICROALB, UR WAIVED: 30 mg/L — AB (ref 0–19)
Microalb/Creat Ratio: <30 mg/g (ref <30)

## 2016-10-13 LAB — MICROSCOPIC EXAMINATION: Bacteria, UA: NONE SEEN

## 2016-10-13 LAB — BAYER DCA HB A1C WAIVED: HB A1C (BAYER DCA - WAIVED): 8.3 % — ABNORMAL HIGH (ref <7.0)

## 2016-10-13 MED ORDER — VICTOZA 18 MG/3ML ~~LOC~~ SOPN: 3.0000 mg | PEN_INJECTOR | Freq: Every day | SUBCUTANEOUS | 4 refills | Status: DC

## 2016-10-13 MED ORDER — ATORVASTATIN CALCIUM 40 MG PO TABS: 40.0000 mg | ORAL_TABLET | Freq: Every day | ORAL | 1 refills | Status: DC

## 2016-10-13 MED ORDER — OMEPRAZOLE 40 MG PO CPDR: 40.0000 mg | DELAYED_RELEASE_CAPSULE | ORAL | 3 refills | Status: DC | PRN

## 2016-10-13 MED ORDER — LISINOPRIL 5 MG PO TABS: 5.0000 mg | ORAL_TABLET | Freq: Every day | ORAL | 1 refills | Status: DC

## 2016-10-13 MED ORDER — ALBUTEROL SULFATE HFA 108 (90 BASE) MCG/ACT IN AERS: 2.0000 | INHALATION_SPRAY | Freq: Four times a day (QID) | RESPIRATORY_TRACT | 1 refills | Status: DC | PRN

## 2016-10-13 NOTE — Assessment & Plan Note (Signed)
Knee is stable and doing OK. Will call if he needs another injection. Work on weight loss

## 2016-10-13 NOTE — Assessment & Plan Note (Signed)
Not under great control. Will start taking his medication daily. Call with any concerns.

## 2016-10-13 NOTE — Assessment & Plan Note (Signed)
Has only been at his full dose of victoza for a week. A1c not at good control at 8.3. Will give him another 3 months with the medicine and recheck in 3 months.

## 2016-10-13 NOTE — Assessment & Plan Note (Signed)
Rechecking levels today. Await results. Call with any concerns.  

## 2016-10-13 NOTE — Patient Instructions (Addendum)
Health Maintenance, Male A healthy lifestyle and preventive care is important for your health and wellness. Ask your health care provider about what schedule of regular examinations is right for you. What should I know about weight and diet? Eat a Healthy Diet  Eat plenty of vegetables, fruits, whole grains, low-fat dairy products, and lean protein.  Do not eat a lot of foods high in solid fats, added sugars, or salt.  Maintain a Healthy Weight Regular exercise can help you achieve or maintain a healthy weight. You should:  Do at least 150 minutes of exercise each week. The exercise should increase your heart rate and make you sweat (moderate-intensity exercise).  Do strength-training exercises at least twice a week.  Watch Your Levels of Cholesterol and Blood Lipids  Have your blood tested for lipids and cholesterol every 5 years starting at 48 years of age. If you are at high risk for heart disease, you should start having your blood tested when you are 48 years old. You may need to have your cholesterol levels checked more often if: ? Your lipid or cholesterol levels are high. ? You are older than 48 years of age. ? You are at high risk for heart disease.  What should I know about cancer screening? Many types of cancers can be detected early and may often be prevented. Lung Cancer  You should be screened every year for lung cancer if: ? You are a current smoker who has smoked for at least 30 years. ? You are a former smoker who has quit within the past 15 years.  Talk to your health care provider about your screening options, when you should start screening, and how often you should be screened.  Colorectal Cancer  Routine colorectal cancer screening usually begins at 48 years of age and should be repeated every 5-10 years until you are 48 years old. You may need to be screened more often if early forms of precancerous polyps or small growths are found. Your health care provider  may recommend screening at an earlier age if you have risk factors for colon cancer.  Your health care provider may recommend using home test kits to check for hidden blood in the stool.  A small camera at the end of a tube can be used to examine your colon (sigmoidoscopy or colonoscopy). This checks for the earliest forms of colorectal cancer.  Prostate and Testicular Cancer  Depending on your age and overall health, your health care provider may do certain tests to screen for prostate and testicular cancer.  Talk to your health care provider about any symptoms or concerns you have about testicular or prostate cancer.  Skin Cancer  Check your skin from head to toe regularly.  Tell your health care provider about any new moles or changes in moles, especially if: ? There is a change in a mole's size, shape, or color. ? You have a mole that is larger than a pencil eraser.  Always use sunscreen. Apply sunscreen liberally and repeat throughout the day.  Protect yourself by wearing long sleeves, pants, a wide-brimmed hat, and sunglasses when outside.  What should I know about heart disease, diabetes, and high blood pressure?  If you are 18-39 years of age, have your blood pressure checked every 3-5 years. If you are 40 years of age or older, have your blood pressure checked every year. You should have your blood pressure measured twice-once when you are at a hospital or clinic, and once when   you are not at a hospital or clinic. Record the average of the two measurements. To check your blood pressure when you are not at a hospital or clinic, you can use: ? An automated blood pressure machine at a pharmacy. ? A home blood pressure monitor.  Talk to your health care provider about your target blood pressure.  If you are between 74-30 years old, ask your health care provider if you should take aspirin to prevent heart disease.  Have regular diabetes screenings by checking your fasting blood  sugar level. ? If you are at a normal weight and have a low risk for diabetes, have this test once every three years after the age of 10. ? If you are overweight and have a high risk for diabetes, consider being tested at a younger age or more often.  A one-time screening for abdominal aortic aneurysm (AAA) by ultrasound is recommended for men aged 32-75 years who are current or former smokers. What should I know about preventing infection? Hepatitis B If you have a higher risk for hepatitis B, you should be screened for this virus. Talk with your health care provider to find out if you are at risk for hepatitis B infection. Hepatitis C Blood testing is recommended for:  Everyone born from [birth year] through 1967.  Anyone with known risk factors for hepatitis C.  Sexually Transmitted Diseases (STDs)  You should be screened each year for STDs including gonorrhea and chlamydia if: ? You are sexually active and are younger than 48 years of age. ? You are older than 48 years of age and your health care provider tells you that you are at risk for this type of infection. ? Your sexual activity has changed since you were last screened and you are at an increased risk for chlamydia or gonorrhea. Ask your health care provider if you are at risk.  Talk with your health care provider about whether you are at high risk of being infected with HIV. Your health care provider may recommend a prescription medicine to help prevent HIV infection.  What else can I do?  Schedule regular health, dental, and eye exams.  Stay current with your vaccines (immunizations).  Do not use any tobacco products, such as cigarettes, chewing tobacco, and e-cigarettes. If you need help quitting, ask your health care provider.  Limit alcohol intake to no more than 2 drinks per day. One drink equals 12 ounces of beer, 5 ounces of wine, or 1 ounces of hard liquor.  Do not use street drugs.  Do not share needles.  Ask your  health care provider for help if you need support or information about quitting drugs.  Tell your health care provider if you often feel depressed.  Tell your health care provider if you have ever been abused or do not feel safe at home. This information is not intended to replace advice given to you by your health care provider. Make sure you discuss any questions you have with your health care provider. Document Released: 06/25/2007 Document Revised: 08/26/2015 Document Reviewed: 09/30/2014 Elsevier Interactive Patient Education  2018 [employer]. Influenza (Flu) Vaccine (Inactivated or Recombinant): What You Need to Know 1. Why get vaccinated? Influenza ("flu") is a contagious disease that spreads around the [place] every year, usually between October and May. Flu is caused by influenza viruses, and is spread mainly by coughing, sneezing, and close contact. Anyone can get flu. Flu strikes suddenly and can last several days. Symptoms vary by  age, but can include:  fever/chills  sore throat  muscle aches  fatigue  cough  headache  runny or stuffy nose  Flu can also lead to pneumonia and blood infections, and cause diarrhea and seizures in children. If you have a medical condition, such as heart or lung disease, flu can make it worse. Flu is more dangerous for some people. Infants and young children, people 58 years of age and older, pregnant women, and people with certain health conditions or a weakened immune system are at greatest risk. Each year thousands of people in the [place] die from flu, and many more are hospitalized. Flu vaccine can:  keep you from getting flu,  make flu less severe if you do get it, and  keep you from spreading flu to your family and other people. 2. Inactivated and recombinant flu vaccines A dose of flu vaccine is recommended every flu season. Children 6 months through 74 years of age may need two doses during the same flu season.  Everyone else needs only one dose each flu season. Some inactivated flu vaccines contain a very small amount of a mercury-based preservative called thimerosal. Studies have not shown thimerosal in vaccines to be harmful, but flu vaccines that do not contain thimerosal are available. There is no live flu virus in flu shots. They cannot cause the flu. There are many flu viruses, and they are always changing. Each year a new flu vaccine is made to protect against three or four viruses that are likely to cause disease in the upcoming flu season. But even when the vaccine doesn't exactly match these viruses, it may still provide some protection. Flu vaccine cannot prevent:  flu that is caused by a virus not covered by the vaccine, or  illnesses that look like flu but are not.  It takes about 2 weeks for protection to develop after vaccination, and protection lasts through the flu season. 3. Some people should not get this vaccine Tell the person who is giving you the vaccine:  If you have any severe, life-threatening allergies. If you ever had a life-threatening allergic reaction after a dose of flu vaccine, or have a severe allergy to any part of this vaccine, you may be advised not to get vaccinated. Most, but not all, types of flu vaccine contain a small amount of egg protein.  If you ever had Guillain-Barr Syndrome (also called GBS). Some people with a history of GBS should not get this vaccine. This should be discussed with your doctor.  If you are not feeling well. It is usually okay to get flu vaccine when you have a mild illness, but you might be asked to come back when you feel better.  4. Risks of a vaccine reaction With any medicine, including vaccines, there is a chance of reactions. These are usually mild and go away on their own, but serious reactions are also possible. Most people who get a flu shot do not have any problems with it. Minor problems following a flu shot  include:  soreness, redness, or swelling where the shot was given  hoarseness  sore, red or itchy eyes  cough  fever  aches  headache  itching  fatigue  If these problems occur, they usually begin soon after the shot and last 1 or 2 days. More serious problems following a flu shot can include the following:  There may be a small increased risk of Guillain-Barre Syndrome (GBS) after inactivated flu vaccine. This risk has  been estimated at 1 or 2 additional cases per million people vaccinated. This is much lower than the risk of severe complications from flu, which can be prevented by flu vaccine.  Young children who get the flu shot along with pneumococcal vaccine (PCV13) and/or DTaP vaccine at the same time might be slightly more likely to have a seizure caused by fever. Ask your doctor for more information. Tell your doctor if a child who is getting flu vaccine has ever had a seizure.  Problems that could happen after any injected vaccine:  People sometimes faint after a medical procedure, including vaccination. Sitting or lying down for about 15 minutes can help prevent fainting, and injuries caused by a fall. Tell your doctor if you feel dizzy, or have vision changes or ringing in the ears.  Some people get severe pain in the shoulder and have difficulty moving the arm where a shot was given. This happens very rarely.  Any medication can cause a severe allergic reaction. Such reactions from a vaccine are very rare, estimated at about 1 in a million doses, and would happen within a few minutes to a few hours after the vaccination. As with any medicine, there is a very remote chance of a vaccine causing a serious injury or death. The safety of vaccines is always being monitored. For more information, visit: http://www.aguilar.org/ 5. What if there is a serious reaction? What should I look for? Look for anything that concerns you, such as signs of a severe allergic reaction,  very high fever, or unusual behavior. Signs of a severe allergic reaction can include hives, swelling of the face and throat, difficulty breathing, a fast heartbeat, dizziness, and weakness. These would start a few minutes to a few hours after the vaccination. What should I do?  If you think it is a severe allergic reaction or other emergency that can't wait, call 9-1-1 and get the person to the nearest hospital. Otherwise, call your doctor.  Reactions should be reported to the Vaccine Adverse Event Reporting System (VAERS). Your doctor should file this report, or you can do it yourself through the VAERS web site at www.vaers.SamedayNews.es, or by calling 573-568-7505. ? VAERS does not give medical advice. 6. The National Vaccine Injury Compensation Program The [employer] Vaccine Injury Compensation Program (VICP) is a federal program that was created to compensate people who may have been injured by certain vaccines. Persons who believe they may have been injured by a vaccine can learn about the program and about filing a claim by calling (513) 290-4018 or visiting the [place] website at GoldCloset.com.ee. There is a time limit to file a claim for compensation. 7. How can I learn more?  Ask your healthcare provider. He or she can give you the vaccine package insert or suggest other sources of information.  Call your local or state health department.  Contact the Centers for Disease Control and Prevention (CDC): ? Call 934-154-0359 (1-800-CDC-INFO) or ? Visit CDC's website at https://gibson.com/ Vaccine Information Statement, Inactivated Influenza Vaccine (08/16/2013) This information is not intended to replace advice given to you by your health care provider. Make sure you discuss any questions you have with your health care provider. Document Released: 10/21/2005 Document Revised: 09/17/2015 Document Reviewed: 09/17/2015 Elsevier Interactive Patient Education  2017 [employer].
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
## 2016-10-14 ENCOUNTER — Other Ambulatory Visit: Payer: Self-pay | Admitting: Family Medicine

## 2016-10-14 ENCOUNTER — Telehealth: Payer: Self-pay | Admitting: Family Medicine

## 2016-10-14 LAB — LIPID PANEL W/O CHOL/HDL RATIO
CHOLESTEROL TOTAL: 224 mg/dL — AB (ref 100–199)
HDL: 29 mg/dL — ABNORMAL LOW (ref >39)
Triglycerides: 915 mg/dL (ref 0–149)

## 2016-10-14 LAB — CBC WITH DIFFERENTIAL/PLATELET
BASOS ABS: 0.1 10*3/uL (ref 0.0–0.2)
Basos: 1 %
EOS (ABSOLUTE): 0.2 10*3/uL (ref 0.0–0.4)
Eos: 2 %
HEMOGLOBIN: 15 g/dL (ref 13.0–17.7)
Hematocrit: 44.5 % (ref 37.5–51.0)
Immature Grans (Abs): 0 10*3/uL (ref 0.0–0.1)
Immature Granulocytes: 0 %
LYMPHS ABS: 3.6 10*3/uL — AB (ref 0.7–3.1)
Lymphs: 40 %
MCH: 30.9 pg (ref 26.6–33.0)
MCHC: 33.7 g/dL (ref 31.5–35.7)
MCV: 92 fL (ref 79–97)
MONOCYTES: 5 %
Monocytes Absolute: 0.5 10*3/uL (ref 0.1–0.9)
NEUTROS ABS: 4.7 10*3/uL (ref 1.4–7.0)
Neutrophils: 52 %
Platelets: 403 10*3/uL — ABNORMAL HIGH (ref 150–379)
RBC: 4.86 x10E6/uL (ref 4.14–5.80)
RDW: 13.7 % (ref 12.3–15.4)
WBC: 9 10*3/uL (ref 3.4–10.8)

## 2016-10-14 LAB — COMPREHENSIVE METABOLIC PANEL
ALBUMIN: 4.9 g/dL (ref 3.5–5.5)
ALK PHOS: 67 IU/L (ref 39–117)
ALT: 39 IU/L (ref 0–44)
AST: 31 IU/L (ref 0–40)
Albumin/Globulin Ratio: 2 (ref 1.2–2.2)
BILIRUBIN TOTAL: 0.2 mg/dL (ref 0.0–1.2)
BUN / CREAT RATIO: 11 (ref 9–20)
BUN: 9 mg/dL (ref 6–24)
CHLORIDE: 98 mmol/L (ref 96–106)
CO2: 20 mmol/L (ref 20–29)
Calcium: 9.8 mg/dL (ref 8.7–10.2)
Creatinine, Ser: 0.79 mg/dL (ref 0.76–1.27)
GFR calc Af Amer: 124 mL/min/{1.73_m2} (ref >59)
GFR calc non Af Amer: 107 mL/min/{1.73_m2} (ref >59)
GLOBULIN, TOTAL: 2.4 g/dL (ref 1.5–4.5)
GLUCOSE: 164 mg/dL — AB (ref 65–99)
POTASSIUM: 4.1 mmol/L (ref 3.5–5.2)
SODIUM: 141 mmol/L (ref 134–144)
Total Protein: 7.3 g/dL (ref 6.0–8.5)

## 2016-10-14 LAB — TSH: TSH: 0.575 u[IU]/mL (ref 0.450–4.500)

## 2016-10-14 MED ORDER — ATORVASTATIN CALCIUM 80 MG PO TABS: 80.0000 mg | ORAL_TABLET | Freq: Every day | ORAL | 1 refills | Status: DC

## 2016-10-14 NOTE — Telephone Encounter (Signed)
Routing to provider  

## 2016-10-14 NOTE — Telephone Encounter (Signed)
I sent everything to [facility] that I can see, which ones didn't get there?

## 2016-10-14 NOTE — Telephone Encounter (Signed)
Called and left a message for patient to let him know that the medication was sent to [facility].

## 2016-10-14 NOTE — Telephone Encounter (Signed)
Patients wife states someone called to get pharmacy information for scripts to be sent in but patient gave incorrect pharmacy she states it should be [place] [facility].  Thanks

## 2016-11-02 ENCOUNTER — Ambulatory Visit: Payer: Self-pay | Admitting: Family Medicine

## 2016-11-03 ENCOUNTER — Encounter: Payer: Self-pay | Admitting: Family Medicine

## 2016-11-03 ENCOUNTER — Ambulatory Visit (INDEPENDENT_AMBULATORY_CARE_PROVIDER_SITE_OTHER): Payer: 59 | Admitting: Family Medicine

## 2016-11-03 VITALS — BP 136/90 | HR 85 | Temp 98.5°F | Wt 261.4 lb

## 2016-11-03 DIAGNOSIS — M1990 Unspecified osteoarthritis, unspecified site: Secondary | ICD-10-CM | POA: Diagnosis not present

## 2016-11-03 NOTE — Progress Notes (Signed)
BP 136/90 (BP Location: Left Arm, Patient Position: Sitting, Cuff Size: Large)   Pulse 85   Temp 98.5 F (36.9 C)   Wt 261 lb 7 oz (118.6 kg)   SpO2 96%   BMI 34.65 kg/m    Subjective:    Patient ID: Tony Cardenas, male    DOB: 03-06-1970, 46 y.o.   MRN: [id number]  HPI: Tony Cardenas is a 46 y.o. male  Chief Complaint  Patient presents with  . Knee Pain    right   KNEE PAIN Duration: chronic- has been acting up for about a week and a half this time Involved knee: right Mechanism of injury: twisted it Location:diffuse Onset: sudden Severity: moderate  Quality:  Aching and sore Frequency: constant Radiation: no Aggravating factors: movement   Alleviating factors: ice and NSAIDs  Status: better Treatments attempted: rest, ice, heat, APAP, ibuprofen and aleve  Relief with NSAIDs?:  mild Weakness with weight bearing or walking: yes Sensation of giving way: yes Locking: no Popping: no Bruising: yes Swelling: yes Redness: no Paresthesias/decreased sensation: no Fevers: no  Relevant past medical, surgical, family and social history reviewed and updated as indicated. Interim medical history since our last visit reviewed. Allergies and medications reviewed and updated.  Review of Systems  Constitutional: Negative.   Respiratory: Negative.   Cardiovascular: Negative.   Musculoskeletal: Positive for arthralgias, gait problem and joint swelling. Negative for back pain, myalgias, neck pain and neck stiffness.  Psychiatric/Behavioral: Negative.     Per HPI unless specifically indicated above     Objective:    BP 136/90 (BP Location: Left Arm, Patient Position: Sitting, Cuff Size: Large)   Pulse 85   Temp 98.5 F (36.9 C)   Wt 261 lb 7 oz (118.6 kg)   SpO2 96%   BMI 34.65 kg/m   Wt Readings from Last 3 Encounters:  11/03/16 261 lb 7 oz (118.6 kg)  10/13/16 258 lb (117 kg)  07/29/16 257 lb (116.6 kg)    Physical Exam  Constitutional: He is  oriented to person, place, and time. He appears well-developed and well-nourished. No distress.  HENT:  Head: Normocephalic and atraumatic.  Right Ear: Hearing normal.  Left Ear: Hearing normal.  Nose: Nose normal.  Eyes: Conjunctivae and lids are normal. Right eye exhibits no discharge. Left eye exhibits no discharge. No scleral icterus.  Cardiovascular: Normal rate, regular rhythm, normal heart sounds and intact distal pulses.  Exam reveals no gallop and no friction rub.   No murmur heard. Pulmonary/Chest: Effort normal and breath sounds normal. No respiratory distress. He has no wheezes. He has no rales. He exhibits no tenderness.  Musculoskeletal: He exhibits edema and tenderness. He exhibits no deformity.  Tenderness along R joint line of knee, negative mcmurrays, negative appley's compression and distraction, negative anterior and posterior drawer  Neurological: He is alert and oriented to person, place, and time.  Skin: Skin is warm, dry and intact. No rash noted. He is not diaphoretic. No erythema. No pallor.  Psychiatric: He has a normal mood and affect. His speech is normal and behavior is normal. Judgment and thought content normal. Cognition and memory are normal.  Nursing note and vitals reviewed.   Results for orders placed or performed in visit on 10/13/16  Microscopic Examination  Result Value Ref Range   WBC, UA 0-5 0 - 5 /hpf   RBC, UA 0-2 0 - 2 /hpf   Epithelial Cells (non renal) CANCELED    Bacteria, UA  None seen None seen/Few  CBC with Differential/Platelet  Result Value Ref Range   WBC 9.0 3.4 - 10.8 x10E3/uL   RBC 4.86 4.14 - 5.80 x10E6/uL   Hemoglobin 15.0 13.0 - 17.7 g/dL   Hematocrit 44.5 37.5 - 51.0 %   MCV 92 79 - 97 fL   MCH 30.9 26.6 - 33.0 pg   MCHC 33.7 31.5 - 35.7 g/dL   RDW 13.7 12.3 - 15.4 %   Platelets 403 (H) 150 - 379 x10E3/uL   Neutrophils 52 Not Estab. %   Lymphs 40 Not Estab. %   Monocytes 5 Not Estab. %   Eos 2 Not Estab. %   Basos 1  Not Estab. %   Neutrophils Absolute 4.7 1.4 - 7.0 x10E3/uL   Lymphocytes Absolute 3.6 (H) 0.7 - 3.1 x10E3/uL   Monocytes Absolute 0.5 0.1 - 0.9 x10E3/uL   EOS (ABSOLUTE) 0.2 0.0 - 0.4 x10E3/uL   Basophils Absolute 0.1 0.0 - 0.2 x10E3/uL   Immature Granulocytes 0 Not Estab. %   Immature Grans (Abs) 0.0 0.0 - 0.1 x10E3/uL  Bayer DCA Hb A1c Waived  Result Value Ref Range   Bayer DCA Hb A1c Waived 8.3 (H) <7.0 %  Comprehensive metabolic panel  Result Value Ref Range   Glucose 164 (H) 65 - 99 mg/dL   BUN 9 6 - 24 mg/dL   Creatinine, Ser 0.79 0.76 - 1.27 mg/dL   GFR calc non Af Amer 107 >59 mL/min/1.73   GFR calc Af Amer 124 >59 mL/min/1.73   BUN/Creatinine Ratio 11 9 - 20   Sodium 141 134 - 144 mmol/L   Potassium 4.1 3.5 - 5.2 mmol/L   Chloride 98 96 - 106 mmol/L   CO2 20 20 - 29 mmol/L   Calcium 9.8 8.7 - 10.2 mg/dL   Total Protein 7.3 6.0 - 8.5 g/dL   Albumin 4.9 3.5 - 5.5 g/dL   Globulin, Total 2.4 1.5 - 4.5 g/dL   Albumin/Globulin Ratio 2.0 1.2 - 2.2   Bilirubin Total 0.2 0.0 - 1.2 mg/dL   Alkaline Phosphatase 67 39 - 117 IU/L   AST 31 0 - 40 IU/L   ALT 39 0 - 44 IU/L  Lipid Panel w/o Chol/HDL Ratio  Result Value Ref Range   Cholesterol, Total 224 (H) 100 - 199 mg/dL   Triglycerides 915 (HH) 0 - 149 mg/dL   HDL 29 (L) >39 mg/dL   VLDL Cholesterol Cal Comment 5 - 40 mg/dL   LDL Calculated Comment 0 - 99 mg/dL  Microalbumin, Urine Waived  Result Value Ref Range   Microalb, Ur Waived 30 (H) 0 - 19 mg/L   Creatinine, Urine Waived 200 10 - 300 mg/dL   Microalb/Creat Ratio <30 <30 mg/g  TSH  Result Value Ref Range   TSH 0.575 0.450 - 4.500 uIU/mL  UA/M w/rflx Culture, Routine  Result Value Ref Range   Specific Gravity, UA 1.025 1.005 - 1.030   pH, UA 5.5 5.0 - 7.5   Color, UA Yellow Yellow   Appearance Ur Clear Clear   Leukocytes, UA Negative Negative   Protein, UA Negative Negative/Trace   Glucose, UA 3+ (A) Negative   Ketones, UA Trace (A) Negative   RBC, UA  Negative Negative   Bilirubin, UA Negative Negative   Urobilinogen, Ur 0.2 0.2 - 1.0 mg/dL   Nitrite, UA Negative Negative   Microscopic Examination See below:       Assessment & Plan:   Problem List Items Addressed  This Visit      Musculoskeletal and Integument   Arthritis - Primary    Knee acting up. Needs another injection. Given today as below.         Procedure: Right  Knee Intraarticular Steroid Injection        Diagnosis:   ICD-10-CM   1. Arthritis M19.90     Physician: MJ Consent:  Risks, benefits, and alternative treatments discussed and all questions were answered.  Patient elected to proceed and verbal consent obtained.  Description: Area prepped and draped using  semi-sterile technique.  Using a anterior/lateral approach, a mixture of 4 cc of  1% lidocaine & 1 cc of Kenalog 40 was injected into knee joint.  A bandage was then placed over the injection site Complications: none Post Procedure Instructions: Wound care instructions discussed and patient was instructed to keep area clean and dry.  Signs and symptoms of infection discussed, patient agrees to contact the office ASAP should they occur.  Follow Up: Return As scheduled.    Follow up plan: Return As scheduled.

## 2016-11-03 NOTE — Patient Instructions (Addendum)
Knee Injection, Care After  Refer to this sheet in the next few weeks. These instructions provide you with information about caring for yourself after your procedure. Your health care provider may also give you more specific instructions. Your treatment has been planned according to current medical practices, but problems sometimes occur. Call your health care provider if you have any problems or questions after your procedure.  What can I expect after the procedure?  After the procedure, it is common to have:   Soreness.   Warmth.   Swelling.    You may have more pain, swelling, and warmth than you did before the injection. This reaction may last for about one day.  Follow these instructions at home:  Bathing   If you were given a bandage (dressing), keep it dry until your health care provider says it can be removed. Ask your health care provider when you can start showering or taking a bath.  Managing pain, stiffness, and swelling   If directed, apply ice to the injection area:  ? Put ice in a plastic bag.  ? Place a towel between your skin and the bag.  ? Leave the ice on for 20 minutes, 2-3 times per day.   Do not apply heat to your knee.   Raise the injection area above the level of your heart while you are sitting or lying down.  Activity   Avoid strenuous activities for as long as directed by your health care provider. Ask your health care provider when you can return to your normal activities.  General instructions   Take medicines only as directed by your health care provider.   Do not take aspirin or other over-the-counter medicines unless your health care provider says you can.   Check your injection site every day for signs of infection. Watch for:  ? Redness, swelling, or pain.  ? Fluid, blood, or pus.   Follow your health care provider's instructions about dressing changes and removal.  Contact a health care provider if:   You have symptoms at your injection site that last longer than  two days after your procedure.   You have redness, swelling, or pain in your injection area.   You have fluid, blood, or pus coming from your injection site.   You have warmth in your injection area.   You have a fever.   Your pain is not controlled with medicine.  Get help right away if:   Your knee turns very red.   Your knee becomes very swollen.   Your knee pain is severe.  This information is not intended to replace advice given to you by your health care provider. Make sure you discuss any questions you have with your health care provider.  Document Released: 01/17/2014 Document Revised: 09/02/2015 Document Reviewed: 11/06/2013  Elsevier Interactive Patient Education  2018 [employer] Inc[employer]

## 2016-11-03 NOTE — Assessment & Plan Note (Signed)
Knee acting up. Needs another injection. Given today as below.

## 2016-11-17 ENCOUNTER — Ambulatory Visit: Payer: 59 | Admitting: Family Medicine

## 2016-12-14 ENCOUNTER — Telehealth: Payer: Self-pay | Admitting: Family Medicine

## 2016-12-14 NOTE — Telephone Encounter (Signed)
Hi Sharyn Lull- it could be coming from the statin, or it could be coming from something else. Have him stop the atorvastatin for a week and see if it gets better. If it doesn't, I really should see him.  ===View-only below this line===   ----- Message -----    From: Teryl Lucy    Sent: 12/13/2016 10:57 AM EST      To: Park Liter, DO Subject: Non-Urgent Medical Question  My husband Adriene Padula (DOB [birth year]) is a patient of Dr Durenda Age.  [name] is having a lot of joint pain in his arms, legs, knees and really all over.  Could this be coming from his medication?  He is taking the Atorvastatin, Lisinopril and Victoza everyday and meloxicam as needed.  If is from his medication and he needs a new Rx sent in please send it to [place] [facility].  Thank you

## 2016-12-20 ENCOUNTER — Encounter: Payer: Self-pay | Admitting: Family Medicine

## 2016-12-20 ENCOUNTER — Ambulatory Visit (INDEPENDENT_AMBULATORY_CARE_PROVIDER_SITE_OTHER): Payer: 59 | Admitting: Family Medicine

## 2016-12-20 VITALS — BP 136/89 | HR 90 | Temp 97.5°F | Wt 255.6 lb

## 2016-12-20 DIAGNOSIS — M2550 Pain in unspecified joint: Secondary | ICD-10-CM

## 2016-12-20 DIAGNOSIS — M25512 Pain in left shoulder: Secondary | ICD-10-CM

## 2016-12-20 DIAGNOSIS — M25511 Pain in right shoulder: Secondary | ICD-10-CM | POA: Diagnosis not present

## 2016-12-20 MED ORDER — NAPROXEN 500 MG PO TABS: 500.0000 mg | ORAL_TABLET | Freq: Two times a day (BID) | ORAL | 1 refills | Status: DC

## 2016-12-20 MED ORDER — PREDNISONE 50 MG PO TABS: 50.0000 mg | ORAL_TABLET | Freq: Every day | ORAL | 0 refills | Status: DC

## 2016-12-20 NOTE — Patient Instructions (Addendum)
Shoulder Exercises Ask your health care provider which exercises are safe for you. Do exercises exactly as told by your health care provider and adjust them as directed. It is normal to feel mild stretching, pulling, tightness, or discomfort as you do these exercises, but you should stop right away if you feel sudden pain or your pain gets worse.Do not begin these exercises until told by your health care provider. RANGE OF MOTION EXERCISES These exercises warm up your muscles and joints and improve the movement and flexibility of your shoulder. These exercises also help to relieve pain, numbness, and tingling. These exercises involve stretching your injured shoulder directly. Exercise A: Pendulum  1. Stand near a wall or a surface that you can hold onto for balance. 2. Bend at the waist and let your left / right arm hang straight down. Use your other arm to support you. Keep your back straight and do not lock your knees. 3. Relax your left / right arm and shoulder muscles, and move your hips and your trunk so your left / right arm swings freely. Your arm should swing because of the motion of your body, not because you are using your arm or shoulder muscles. 4. Keep moving your body so your arm swings in the following directions, as told by your health care provider: ? Side to side. ? Forward and backward. ? In clockwise and counterclockwise circles. 5. Continue each motion for __________ seconds, or for as long as told by your health care provider. 6. Slowly return to the starting position. Repeat __________ times. Complete this exercise __________ times a day. Exercise B:Flexion, Standing  1. Stand and hold a broomstick, a cane, or a similar object. Place your hands a little more than shoulder-width apart on the object. Your left / right hand should be palm-up, and your other hand should be palm-down. 2. Keep your elbow straight and keep your shoulder muscles relaxed. Push the stick down with  your healthy arm to raise your left / right arm in front of your body, and then over your head until you feel a stretch in your shoulder. ? Avoid shrugging your shoulder while you raise your arm. Keep your shoulder blade tucked down toward the middle of your back. 3. Hold for __________ seconds. 4. Slowly return to the starting position. Repeat __________ times. Complete this exercise __________ times a day. Exercise C: Abduction, Standing 1. Stand and hold a broomstick, a cane, or a similar object. Place your hands a little more than shoulder-width apart on the object. Your left / right hand should be palm-up, and your other hand should be palm-down. 2. While keeping your elbow straight and your shoulder muscles relaxed, push the stick across your body toward your left / right side. Raise your left / right arm to the side of your body and then over your head until you feel a stretch in your shoulder. ? Do not raise your arm above shoulder height, unless your health care provider tells you to do that. ? Avoid shrugging your shoulder while you raise your arm. Keep your shoulder blade tucked down toward the middle of your back. 3. Hold for __________ seconds. 4. Slowly return to the starting position. Repeat __________ times. Complete this exercise __________ times a day. Exercise D:Internal Rotation  1. Place your left / right hand behind your back, palm-up. 2. Use your other hand to dangle an exercise band, a towel, or a similar object over your shoulder. Grasp the band with   your left / right hand so you are holding onto both ends. 3. Gently pull up on the band until you feel a stretch in the front of your left / right shoulder. ? Avoid shrugging your shoulder while you raise your arm. Keep your shoulder blade tucked down toward the middle of your back. 4. Hold for __________ seconds. 5. Release the stretch by letting go of the band and lowering your hands. Repeat __________ times. Complete  this exercise __________ times a day. STRETCHING EXERCISES These exercises warm up your muscles and joints and improve the movement and flexibility of your shoulder. These exercises also help to relieve pain, numbness, and tingling. These exercises are done using your healthy shoulder to help stretch the muscles of your injured shoulder. Exercise E: Corner Stretch (External Rotation and Abduction)  1. Stand in a doorway with one of your feet slightly in front of the other. This is called a staggered stance. If you cannot reach your forearms to the door frame, stand facing a corner of a room. 2. Choose one of the following positions as told by your health care provider: ? Place your hands and forearms on the door frame above your head. ? Place your hands and forearms on the door frame at the height of your head. ? Place your hands on the door frame at the height of your elbows. 3. Slowly move your weight onto your front foot until you feel a stretch across your chest and in the front of your shoulders. Keep your head and chest upright and keep your abdominal muscles tight. 4. Hold for __________ seconds. 5. To release the stretch, shift your weight to your back foot. Repeat __________ times. Complete this stretch __________ times a day. Exercise F:Extension, Standing 1. Stand and hold a broomstick, a cane, or a similar object behind your back. ? Your hands should be a little wider than shoulder-width apart. ? Your palms should face away from your back. 2. Keeping your elbows straight and keeping your shoulder muscles relaxed, move the stick away from your body until you feel a stretch in your shoulder. ? Avoid shrugging your shoulders while you move the stick. Keep your shoulder blade tucked down toward the middle of your back. 3. Hold for __________ seconds. 4. Slowly return to the starting position. Repeat __________ times. Complete this exercise __________ times a day. STRENGTHENING  EXERCISES These exercises build strength and endurance in your shoulder. Endurance is the ability to use your muscles for a long time, even after they get tired. Exercise G:External Rotation  1. Sit in a stable chair without armrests. 2. Secure an exercise band at elbow height on your left / right side. 3. Place a soft object, such as a folded towel or a small pillow, between your left / right upper arm and your body to move your elbow a few inches away (about 10 cm) from your side. 4. Hold the end of the band so it is tight and there is no slack. 5. Keeping your elbow pressed against the soft object, move your left / right forearm out, away from your abdomen. Keep your body steady so only your forearm moves. 6. Hold for __________ seconds. 7. Slowly return to the starting position. Repeat __________ times. Complete this exercise __________ times a day. Exercise H:Shoulder Abduction  1. Sit in a stable chair without armrests, or stand. 2. Hold a __________ weight in your left / right hand, or hold an exercise band with both hands.   3. Start with your arms straight down and your left / right palm facing in, toward your body. 4. Slowly lift your left / right hand out to your side. Do not lift your hand above shoulder height unless your health care provider tells you that this is safe. ? Keep your arms straight. ? Avoid shrugging your shoulder while you do this movement. Keep your shoulder blade tucked down toward the middle of your back. 5. Hold for __________ seconds. 6. Slowly lower your arm, and return to the starting position. Repeat __________ times. Complete this exercise __________ times a day. Exercise I:Shoulder Extension 1. Sit in a stable chair without armrests, or stand. 2. Secure an exercise band to a stable object in front of you where it is at shoulder height. 3. Hold one end of the exercise band in each hand. Your palms should face each other. 4. Straighten your elbows and  lift your hands up to shoulder height. 5. Step back, away from the secured end of the exercise band, until the band is tight and there is no slack. 6. Squeeze your shoulder blades together as you pull your hands down to the sides of your thighs. Stop when your hands are straight down by your sides. Do not let your hands go behind your body. 7. Hold for __________ seconds. 8. Slowly return to the starting position. Repeat __________ times. Complete this exercise __________ times a day. Exercise J:Standing Shoulder Row 1. Sit in a stable chair without armrests, or stand. 2. Secure an exercise band to a stable object in front of you so it is at waist height. 3. Hold one end of the exercise band in each hand. Your palms should be in a thumbs-up position. 4. Bend each of your elbows to an "L" shape (about 90 degrees) and keep your upper arms at your sides. 5. Step back until the band is tight and there is no slack. 6. Slowly pull your elbows back behind you. 7. Hold for __________ seconds. 8. Slowly return to the starting position. Repeat __________ times. Complete this exercise __________ times a day. Exercise K:Shoulder Press-Ups  1. Sit in a stable chair that has armrests. Sit upright, with your feet flat on the floor. 2. Put your hands on the armrests so your elbows are bent and your fingers are pointing forward. Your hands should be about even with the sides of your body. 3. Push down on the armrests and use your arms to lift yourself off of the chair. Straighten your elbows and lift yourself up as much as you comfortably can. ? Move your shoulder blades down, and avoid letting your shoulders move up toward your ears. ? Keep your feet on the ground. As you get stronger, your feet should support less of your body weight as you lift yourself up. 4. Hold for __________ seconds. 5. Slowly lower yourself back into the chair. Repeat __________ times. Complete this exercise __________ times a  day. Exercise L: Wall Push-Ups  1. Stand so you are facing a stable wall. Your feet should be about one arm-length away from the wall. 2. Lean forward and place your palms on the wall at shoulder height. 3. Keep your feet flat on the floor as you bend your elbows and lean forward toward the wall. 4. Hold for __________ seconds. 5. Straighten your elbows to push yourself back to the starting position. Repeat __________ times. Complete this exercise __________ times a day. This information is not intended to replace advice   given to you by your health care provider. Make sure you discuss any questions you have with your health care provider. Document Released: 11/10/2004 Document Revised: 09/21/2015 Document Reviewed: 09/07/2014 Elsevier Interactive Patient Education  2018 [employer] Inc.  

## 2016-12-20 NOTE — Progress Notes (Signed)
BP 136/89 (BP Location: Left Arm, Patient Position: Sitting, Cuff Size: Large)   Pulse 90   Temp (!) 97.5 F (36.4 C)   Wt 255 lb 9 oz (115.9 kg)   SpO2 96%   BMI 33.87 kg/m    Subjective:    Patient ID: Tony Cardenas, male    DOB: 11-01-1970, 46 y.o.   MRN: [id number]  HPI: Tony Cardenas is a 46 y.o. male  Chief Complaint  Patient presents with  . Joint Pain   ARTHRALGIAS / JOINT ACHES Duration: started off not so bad- worse over the past 3-4 weeks, tried coming off his statin and it made no different Pain: yes Symmetric: yes Severity: severe Quality: sharp, dull and aching Frequency: constant Context:  worse Decreased function/range of motion: no  Erythema: no Swelling: no Heat or warmth: no Morning stiffness: yes- few hours for him to loosen up Aggravating factors: sitting for a long period Alleviating factors: hot shower Relief with NSAIDs?: no Treatments attempted:  rest, ice, heat, APAP, ibuprofen and aleve  Involved Joints:     Hands: no     Wrists: no      Elbows: no     Shoulders: yes bilateral    Back: no     Hips: yes bilateral    Knees: yes right    Ankles: no     Feet: no   Relevant past medical, surgical, family and social history reviewed and updated as indicated. Interim medical history since our last visit reviewed. Allergies and medications reviewed and updated.  Review of Systems  Constitutional: Negative.   Respiratory: Negative.   Cardiovascular: Negative.   Musculoskeletal: Positive for arthralgias and myalgias. Negative for back pain, gait problem, joint swelling, neck pain and neck stiffness.  Skin: Negative.  Negative for color change, pallor, rash and wound.  Neurological: Negative.   Psychiatric/Behavioral: Negative.     Per HPI unless specifically indicated above     Objective:    BP 136/89 (BP Location: Left Arm, Patient Position: Sitting, Cuff Size: Large)   Pulse 90   Temp (!) 97.5 F (36.4 C)   Wt 255  lb 9 oz (115.9 kg)   SpO2 96%   BMI 33.87 kg/m   Wt Readings from Last 3 Encounters:  12/20/16 255 lb 9 oz (115.9 kg)  11/03/16 261 lb 7 oz (118.6 kg)  10/13/16 258 lb (117 kg)    Physical Exam  Constitutional: He is oriented to person, place, and time. He appears well-developed and well-nourished. No distress.  HENT:  Head: Normocephalic and atraumatic.  Right Ear: Hearing normal.  Left Ear: Hearing normal.  Nose: Nose normal.  Eyes: Conjunctivae and lids are normal. Right eye exhibits no discharge. Left eye exhibits no discharge. No scleral icterus.  Cardiovascular: Normal rate, regular rhythm, normal heart sounds and intact distal pulses. Exam reveals no gallop and no friction rub.  No murmur heard. Pulmonary/Chest: Effort normal and breath sounds normal. No respiratory distress. He has no wheezes. He has no rales. He exhibits no tenderness.  Neurological: He is alert and oriented to person, place, and time.  Skin: Skin is warm, dry and intact. No rash noted. He is not diaphoretic. No erythema. No pallor.  Psychiatric: He has a normal mood and affect. His speech is normal and behavior is normal. Judgment and thought content normal. Cognition and memory are normal.  Nursing note and vitals reviewed.    Shoulder: bilateral    Inspection:  no swelling,  ecchymosis, erythema or step off deformity.    Tenderness to Palpation:    Acromion: no    AC joint:yes    Clavicle: no    Bicipital groove: yes    Scapular spine: no    Coracoid process: no    Humeral head: yes    Supraspinatus tendon: no     Range of Motion:  Full Range of Motion bilaterally    Painful arc: yes     Muscle Strength: 5/5 bilaterally     Neuro: Sensation WNL. and Upper extremity reflexes WNL.     Special Tests:     Neer sign: Negative    Hawkins sign: Negative    Cross arm adduction: Negative    Yergason sign: Negative    O'brien sign: Negative     Speed sign: Negative   Results for orders placed or  performed in visit on 10/13/16  Microscopic Examination  Result Value Ref Range   WBC, UA 0-5 0 - 5 /hpf   RBC, UA 0-2 0 - 2 /hpf   Epithelial Cells (non renal) CANCELED    Bacteria, UA None seen None seen/Few  CBC with Differential/Platelet  Result Value Ref Range   WBC 9.0 3.4 - 10.8 x10E3/uL   RBC 4.86 4.14 - 5.80 x10E6/uL   Hemoglobin 15.0 13.0 - 17.7 g/dL   Hematocrit 44.5 37.5 - 51.0 %   MCV 92 79 - 97 fL   MCH 30.9 26.6 - 33.0 pg   MCHC 33.7 31.5 - 35.7 g/dL   RDW 13.7 12.3 - 15.4 %   Platelets 403 (H) 150 - 379 x10E3/uL   Neutrophils 52 Not Estab. %   Lymphs 40 Not Estab. %   Monocytes 5 Not Estab. %   Eos 2 Not Estab. %   Basos 1 Not Estab. %   Neutrophils Absolute 4.7 1.4 - 7.0 x10E3/uL   Lymphocytes Absolute 3.6 (H) 0.7 - 3.1 x10E3/uL   Monocytes Absolute 0.5 0.1 - 0.9 x10E3/uL   EOS (ABSOLUTE) 0.2 0.0 - 0.4 x10E3/uL   Basophils Absolute 0.1 0.0 - 0.2 x10E3/uL   Immature Granulocytes 0 Not Estab. %   Immature Grans (Abs) 0.0 0.0 - 0.1 x10E3/uL  Bayer DCA Hb A1c Waived  Result Value Ref Range   Bayer DCA Hb A1c Waived 8.3 (H) <7.0 %  Comprehensive metabolic panel  Result Value Ref Range   Glucose 164 (H) 65 - 99 mg/dL   BUN 9 6 - 24 mg/dL   Creatinine, Ser 0.79 0.76 - 1.27 mg/dL   GFR calc non Af Amer 107 >59 mL/min/1.73   GFR calc Af Amer 124 >59 mL/min/1.73   BUN/Creatinine Ratio 11 9 - 20   Sodium 141 134 - 144 mmol/L   Potassium 4.1 3.5 - 5.2 mmol/L   Chloride 98 96 - 106 mmol/L   CO2 20 20 - 29 mmol/L   Calcium 9.8 8.7 - 10.2 mg/dL   Total Protein 7.3 6.0 - 8.5 g/dL   Albumin 4.9 3.5 - 5.5 g/dL   Globulin, Total 2.4 1.5 - 4.5 g/dL   Albumin/Globulin Ratio 2.0 1.2 - 2.2   Bilirubin Total 0.2 0.0 - 1.2 mg/dL   Alkaline Phosphatase 67 39 - 117 IU/L   AST 31 0 - 40 IU/L   ALT 39 0 - 44 IU/L  Lipid Panel w/o Chol/HDL Ratio  Result Value Ref Range   Cholesterol, Total 224 (H) 100 - 199 mg/dL   Triglycerides 915 (HH) 0 - 149 mg/dL  HDL 29 (L) >39  mg/dL   VLDL Cholesterol Cal Comment 5 - 40 mg/dL   LDL Calculated Comment 0 - 99 mg/dL  Microalbumin, Urine Waived  Result Value Ref Range   Microalb, Ur Waived 30 (H) 0 - 19 mg/L   Creatinine, Urine Waived 200 10 - 300 mg/dL   Microalb/Creat Ratio <30 <30 mg/g  TSH  Result Value Ref Range   TSH 0.575 0.450 - 4.500 uIU/mL  UA/M w/rflx Culture, Routine  Result Value Ref Range   Specific Gravity, UA 1.025 1.005 - 1.030   pH, UA 5.5 5.0 - 7.5   Color, UA Yellow Yellow   Appearance Ur Clear Clear   Leukocytes, UA Negative Negative   Protein, UA Negative Negative/Trace   Glucose, UA 3+ (A) Negative   Ketones, UA Trace (A) Negative   RBC, UA Negative Negative   Bilirubin, UA Negative Negative   Urobilinogen, Ur 0.2 0.2 - 1.0 mg/dL   Nitrite, UA Negative Negative   Microscopic Examination See below:       Assessment & Plan:   Problem List Items Addressed This Visit    None    Visit Diagnoses    Arthralgia, unspecified joint    -  Primary   Will check labs and x-ray. Start medicine and exercises. Recheck 2 weeks. Call with any concerns.    Relevant Orders   CBC with Differential/Platelet   Comprehensive metabolic panel   CK (Creatine Kinase)   Sed Rate (ESR)   Lyme Ab/Western Blot Reflex   Rocky mtn spotted fvr abs pnl(IgG+IgM)   Babesia microti Antibody Panel   Ehrlichia Antibody Panel   RA Qn+CCP(IgG/A)+SjoSSA+SjoSSB   Antinuclear Antib (ANA)   Uric acid   Thyroid Panel With TSH   Acute pain of both shoulders       Will check labs and x-ray. Start medicine and exercises. Recheck 2 weeks. Call with any concerns.    Relevant Orders   DG Shoulder Right   DG Shoulder Left       Follow up plan: Return 2-3 weeks, for follow up pain.

## 2016-12-22 ENCOUNTER — Telehealth: Payer: Self-pay | Admitting: Family Medicine

## 2016-12-22 LAB — ROCKY MTN SPOTTED FVR ABS PNL(IGG+IGM)
RMSF IGM: 0.29 {index} (ref 0.00–0.89)
RMSF IgG: NEGATIVE

## 2016-12-22 LAB — CK: Total CK: 50 U/L (ref 24–204)

## 2016-12-22 LAB — COMPREHENSIVE METABOLIC PANEL
A/G RATIO: 1.6 (ref 1.2–2.2)
ALBUMIN: 4.4 g/dL (ref 3.5–5.5)
ALK PHOS: 94 IU/L (ref 39–117)
ALT: 17 IU/L (ref 0–44)
AST: 13 IU/L (ref 0–40)
BUN/Creatinine Ratio: 14 (ref 9–20)
BUN: 9 mg/dL (ref 6–24)
Bilirubin Total: 0.3 mg/dL (ref 0.0–1.2)
CHLORIDE: 100 mmol/L (ref 96–106)
CO2: 23 mmol/L (ref 20–29)
Calcium: 9.1 mg/dL (ref 8.7–10.2)
Creatinine, Ser: 0.64 mg/dL — ABNORMAL LOW (ref 0.76–1.27)
GFR, EST AFRICAN AMERICAN: 134 mL/min/{1.73_m2} (ref >59)
GFR, EST NON AFRICAN AMERICAN: 116 mL/min/{1.73_m2} (ref >59)
GLOBULIN, TOTAL: 2.8 g/dL (ref 1.5–4.5)
Glucose: 193 mg/dL — ABNORMAL HIGH (ref 65–99)
Potassium: 4.5 mmol/L (ref 3.5–5.2)
Sodium: 137 mmol/L (ref 134–144)
Total Protein: 7.2 g/dL (ref 6.0–8.5)

## 2016-12-22 LAB — CBC WITH DIFFERENTIAL/PLATELET
BASOS ABS: 0.1 10*3/uL (ref 0.0–0.2)
Basos: 1 %
EOS (ABSOLUTE): 0.2 10*3/uL (ref 0.0–0.4)
EOS: 2 %
Hematocrit: 41.9 % (ref 37.5–51.0)
Hemoglobin: 14.6 g/dL (ref 13.0–17.7)
IMMATURE GRANULOCYTES: 1 %
Immature Grans (Abs): 0 10*3/uL (ref 0.0–0.1)
Lymphocytes Absolute: 2.7 10*3/uL (ref 0.7–3.1)
Lymphs: 34 %
MCH: 30.6 pg (ref 26.6–33.0)
MCHC: 34.8 g/dL (ref 31.5–35.7)
MCV: 88 fL (ref 79–97)
MONOS ABS: 0.5 10*3/uL (ref 0.1–0.9)
Monocytes: 6 %
NEUTROS PCT: 56 %
Neutrophils Absolute: 4.5 10*3/uL (ref 1.4–7.0)
Platelets: 441 10*3/uL — ABNORMAL HIGH (ref 150–379)
RBC: 4.77 x10E6/uL (ref 4.14–5.80)
RDW: 13.1 % (ref 12.3–15.4)
WBC: 8 10*3/uL (ref 3.4–10.8)

## 2016-12-22 LAB — BABESIA MICROTI ANTIBODY PANEL

## 2016-12-22 LAB — EHRLICHIA ANTIBODY PANEL
E. CHAFFEENSIS (HME) IGM TITER: NEGATIVE
E. CHAFFEENSIS IGG AB: NEGATIVE
HGE IgG Titer: NEGATIVE
HGE IgM Titer: NEGATIVE

## 2016-12-22 LAB — THYROID PANEL WITH TSH
Free Thyroxine Index: 2.9 (ref 1.2–4.9)
T3 UPTAKE RATIO: 29 % (ref 24–39)
T4 TOTAL: 10 ug/dL (ref 4.5–12.0)
TSH: 0.372 u[IU]/mL — AB (ref 0.450–4.500)

## 2016-12-22 LAB — RA QN+CCP(IGG/A)+SJOSSA+SJOSSB
CYCLIC CITRULLIN PEPTIDE AB: 6 U (ref 0–19)
ENA SSA (RO) Ab: <0.2 AI (ref 0.0–0.9)
ENA SSB (LA) Ab: <0.2 AI (ref 0.0–0.9)

## 2016-12-22 LAB — SEDIMENTATION RATE: Sed Rate: 58 mm/hr — ABNORMAL HIGH (ref 0–15)

## 2016-12-22 LAB — LYME AB/WESTERN BLOT REFLEX: LYME DISEASE AB, QUANT, IGM: <0.8 index (ref 0.00–0.79)

## 2016-12-22 LAB — URIC ACID: URIC ACID: 3.5 mg/dL — AB (ref 3.7–8.6)

## 2016-12-22 LAB — ANA: ANA: NEGATIVE

## 2016-12-22 NOTE — Telephone Encounter (Signed)
Called and left a message on patients voicemail letting him know his results.

## 2016-12-22 NOTE — Telephone Encounter (Signed)
Please let him know that his labs came back normal except a very slightly elevated marker of inflammation that could be from a cold. No tick diseases. He just needs to get those x-rays and we'll see what's going on. Thanks!

## 2016-12-26 ENCOUNTER — Telehealth: Payer: Self-pay | Admitting: Family Medicine

## 2016-12-26 ENCOUNTER — Ambulatory Visit
Admission: RE | Admit: 2016-12-26 | Discharge: 2016-12-26 | Disposition: A | Discharge status: Home / Self Care | Payer: 59 | Source: Ambulatory Visit | Attending: Family Medicine | Admitting: Family Medicine

## 2016-12-26 ENCOUNTER — Encounter: Payer: Self-pay | Admitting: Family Medicine

## 2016-12-26 DIAGNOSIS — M25512 Pain in left shoulder: Principal | ICD-10-CM

## 2016-12-26 DIAGNOSIS — M25511 Pain in right shoulder: Secondary | ICD-10-CM | POA: Diagnosis present

## 2016-12-26 DIAGNOSIS — M19011 Primary osteoarthritis, right shoulder: Secondary | ICD-10-CM | POA: Insufficient documentation

## 2016-12-26 NOTE — Telephone Encounter (Signed)
He can take the naproxen I gave him, but it's not a great idea to be on long term prednisone. Also, unfortunately Sharyn Lull, this is all going in your chart- if possible, can you get [name] to set up his mychart so you can message me from there? I'll let you know as soon as I get his x-ray results back. ===View-only below this line===   ----- Message -----    From: Teryl Lucy    Sent: 12/26/2016  3:46 PM EST      To: Park Liter, DO Subject: RE: Non-Urgent Medical Question  He promised me he would be going today.  Can he get another Rx for prednisone since that helped before.  If so please send it to [facility].  Thank you for your help. ----- Message ----- From: Park Liter, DO Sent: 12/26/2016  2:03 PM EST To: Teryl Lucy Subject: RE: Non-Urgent Medical Question He needs to have his x-ray done. I cannot tell him what's wrong without seeing it.   ----- Message -----    From: Teryl Lucy    Sent: 12/26/2016  1:18 PM EST      To: Park Liter, DO Subject: Non-Urgent Medical Question  I am sending this for Tony Cardenas  DOB 2070-02-25.  He seen Dr Wynetta Emery last week for shoulder pain.  He took his last dose of prednisone yesterday.  It did help with the pain.  But he is hurting again today the same way he was before .  He is having his x-Ray today.  Please advise

## 2016-12-28 ENCOUNTER — Encounter: Payer: Self-pay | Admitting: Family Medicine

## 2016-12-28 ENCOUNTER — Telehealth: Payer: Self-pay | Admitting: Family Medicine

## 2016-12-28 DIAGNOSIS — M25511 Pain in right shoulder: Secondary | ICD-10-CM

## 2016-12-28 DIAGNOSIS — S43004A Unspecified dislocation of right shoulder joint, initial encounter: Secondary | ICD-10-CM

## 2016-12-28 MED ORDER — PREDNISONE 50 MG PO TABS: 50.0000 mg | ORAL_TABLET | Freq: Every day | ORAL | 0 refills | Status: DC

## 2016-12-28 NOTE — Telephone Encounter (Signed)
Patient notified

## 2016-12-28 NOTE — Telephone Encounter (Signed)
Copied from [place] [id number]. Topic: General - Other >> Dec 28, 2016 10:06 AM Carolyn Stare wrote:   Pt would Tiffany to call him back said he wants to talk to her   [phone]   >> Dec 28, 2016 10:13 AM Atilano Median, Clovia Cuff wrote: Please Advise.  Refer to telephone encounter 12/28/2016. >> Dec 28, 2016 11:44 AM Reel, Rexene Edison, CMA wrote: Patient states that the prednisone helped him, he would like to know if he can have another round.   Kell

## 2016-12-28 NOTE — Telephone Encounter (Signed)
OK to have 5 more days, but prednisone is not something he can stay on, so he needs to see ortho

## 2016-12-28 NOTE — Telephone Encounter (Signed)
Please let him know that his x-ray showed some arthritis and that it might have a little bit of a separation. I'd like him to see the orthopedist to get better. Prednisone may not help right now. I've put the referral in.

## 2016-12-29 MED ORDER — PREDNISONE 50 MG PO TABS: 50.0000 mg | ORAL_TABLET | Freq: Every day | ORAL | 0 refills | Status: DC

## 2017-01-12 ENCOUNTER — Ambulatory Visit (INDEPENDENT_AMBULATORY_CARE_PROVIDER_SITE_OTHER): Payer: 59 | Admitting: Orthopaedic Surgery

## 2017-01-13 ENCOUNTER — Ambulatory Visit: Payer: 59 | Admitting: Family Medicine

## 2017-01-18 ENCOUNTER — Ambulatory Visit (INDEPENDENT_AMBULATORY_CARE_PROVIDER_SITE_OTHER): Payer: 59 | Admitting: Family Medicine

## 2017-01-18 VITALS — BP 147/85 | HR 91 | Temp 98.0°F | Resp 16 | Wt 253.4 lb

## 2017-01-18 DIAGNOSIS — J069 Acute upper respiratory infection, unspecified: Secondary | ICD-10-CM | POA: Diagnosis not present

## 2017-01-18 MED ORDER — AZITHROMYCIN 250 MG PO TABS: ORAL_TABLET | ORAL | 0 refills | Status: DC

## 2017-01-18 MED ORDER — HYDROCOD POLST-CPM POLST ER 10-8 MG/5ML PO SUER: 5.0000 mL | Freq: Two times a day (BID) | ORAL | 0 refills | Status: DC | PRN

## 2017-01-18 MED ORDER — BENZONATATE 200 MG PO CAPS: 200.0000 mg | ORAL_CAPSULE | Freq: Three times a day (TID) | ORAL | 0 refills | Status: DC | PRN

## 2017-01-18 NOTE — Progress Notes (Addendum)
BP (!) 147/85 (BP Location: Left Arm, Patient Position: Sitting)   Pulse 91   Temp 98 F (36.7 C) (Temporal)   Resp 16   Wt 253 lb 6.4 oz (114.9 kg)   BMI 33.58 kg/m    Subjective:    Patient ID: Tony Cardenas, male    DOB: 1970/11/12, 47 y.o.   MRN: [id number]  HPI: Tony Cardenas is a 47 y.o. male  Chief Complaint  Patient presents with  . Cough    productive cough x1week  . Sore Throat   Over a week of generalized malaise, sore throat, congestion, ear pressure, now moving more into chest with productive cough, chest soreness and tightness, mild SOB worst at night. Denies fever, chills, aches, CP, wheezing. Trying OTC cold and flu medications with temporary relief. Lots of sick contacts. Pt a former smoker, quit a little over a year ago. Hx of COPD.   Relevant past medical, surgical, family and social history reviewed and updated as indicated. Interim medical history since our last visit reviewed. Allergies and medications reviewed and updated.  Review of Systems  Constitutional: Positive for fatigue.  HENT: Positive for congestion, ear pain and sore throat.   Eyes: Negative.   Respiratory: Positive for cough, chest tightness and shortness of breath.   Cardiovascular: Negative.   Gastrointestinal: Negative.   Genitourinary: Negative.   Musculoskeletal: Negative.   Neurological: Negative.   Psychiatric/Behavioral: Negative.     Per HPI unless specifically indicated above     Objective:    BP (!) 147/85 (BP Location: Left Arm, Patient Position: Sitting)   Pulse 91   Temp 98 F (36.7 C) (Temporal)   Resp 16   Wt 253 lb 6.4 oz (114.9 kg)   BMI 33.58 kg/m   Wt Readings from Last 3 Encounters:  01/18/17 253 lb 6.4 oz (114.9 kg)  12/20/16 255 lb 9 oz (115.9 kg)  11/03/16 261 lb 7 oz (118.6 kg)    Physical Exam  Constitutional: He is oriented to person, place, and time. He appears well-developed and well-nourished. No distress.  HENT:  Head:  Atraumatic.  Left TM injected Oropharynx and nasal mucosa erythematous with thick discharge present  Eyes: Conjunctivae are normal. Pupils are equal, round, and reactive to light.  Neck: Normal range of motion. Neck supple.  Cardiovascular: Normal rate and normal heart sounds.  Pulmonary/Chest: Effort normal. No respiratory distress. He has wheezes (minimal).  Musculoskeletal: Normal range of motion.  Lymphadenopathy:    He has no cervical adenopathy.  Neurological: He is alert and oriented to person, place, and time.  Skin: Skin is warm and dry.  Psychiatric: He has a normal mood and affect. His behavior is normal.  Nursing note and vitals reviewed.   Results for orders placed or performed in visit on 12/20/16  CBC with Differential/Platelet  Result Value Ref Range   WBC 8.0 3.4 - 10.8 x10E3/uL   RBC 4.77 4.14 - 5.80 x10E6/uL   Hemoglobin 14.6 13.0 - 17.7 g/dL   Hematocrit 41.9 37.5 - 51.0 %   MCV 88 79 - 97 fL   MCH 30.6 26.6 - 33.0 pg   MCHC 34.8 31.5 - 35.7 g/dL   RDW 13.1 12.3 - 15.4 %   Platelets 441 (H) 150 - 379 x10E3/uL   Neutrophils 56 Not Estab. %   Lymphs 34 Not Estab. %   Monocytes 6 Not Estab. %   Eos 2 Not Estab. %   Basos 1 Not Estab. %  Neutrophils Absolute 4.5 1.4 - 7.0 x10E3/uL   Lymphocytes Absolute 2.7 0.7 - 3.1 x10E3/uL   Monocytes Absolute 0.5 0.1 - 0.9 x10E3/uL   EOS (ABSOLUTE) 0.2 0.0 - 0.4 x10E3/uL   Basophils Absolute 0.1 0.0 - 0.2 x10E3/uL   Immature Granulocytes 1 Not Estab. %   Immature Grans (Abs) 0.0 0.0 - 0.1 x10E3/uL  Comprehensive metabolic panel  Result Value Ref Range   Glucose 193 (H) 65 - 99 mg/dL   BUN 9 6 - 24 mg/dL   Creatinine, Ser 0.64 (L) 0.76 - 1.27 mg/dL   GFR calc non Af Amer 116 >59 mL/min/1.73   GFR calc Af Amer 134 >59 mL/min/1.73   BUN/Creatinine Ratio 14 9 - 20   Sodium 137 134 - 144 mmol/L   Potassium 4.5 3.5 - 5.2 mmol/L   Chloride 100 96 - 106 mmol/L   CO2 23 20 - 29 mmol/L   Calcium 9.1 8.7 - 10.2 mg/dL    Total Protein 7.2 6.0 - 8.5 g/dL   Albumin 4.4 3.5 - 5.5 g/dL   Globulin, Total 2.8 1.5 - 4.5 g/dL   Albumin/Globulin Ratio 1.6 1.2 - 2.2   Bilirubin Total 0.3 0.0 - 1.2 mg/dL   Alkaline Phosphatase 94 39 - 117 IU/L   AST 13 0 - 40 IU/L   ALT 17 0 - 44 IU/L  CK (Creatine Kinase)  Result Value Ref Range   Total CK 50 24 - 204 U/L  Sed Rate (ESR)  Result Value Ref Range   Sed Rate 58 (H) 0 - 15 mm/hr  Lyme Ab/Western Blot Reflex  Result Value Ref Range   Lyme IgG/IgM Ab <0.91 0.00 - 0.90 ISR   LYME DISEASE AB, QUANT, IGM <0.80 0.00 - 0.79 index  Rocky mtn spotted fvr abs pnl(IgG+IgM)  Result Value Ref Range   RMSF IgG Negative Negative   RMSF IgM 0.29 0.00 - 0.89 index  Babesia microti Antibody Panel  Result Value Ref Range   Babesia microti IgM <1:10 Neg:<1:10   Babesia microti IgG <2:72 ZDG:<6:44  Ehrlichia Antibody Panel  Result Value Ref Range   E.Chaffeensis (HME) IgG Negative Neg:<1:64   E. Chaffeensis (HME) IgM Titer Negative Neg:<1:20   HGE IgG Titer Negative Neg:<1:64   HGE IgM Titer Negative Neg:<1:20  RA Qn+CCP(IgG/A)+SjoSSA+SjoSSB  Result Value Ref Range   Rhuematoid fact SerPl-aCnc <10.0 0.0 - 13.9 IU/mL   ENA SSA (RO) Ab <0.2 0.0 - 0.9 AI   ENA SSB (LA) Ab <0.2 0.0 - 0.9 AI   Cyclic Citrullin Peptide Ab 6 0 - 19 units  Antinuclear Antib (ANA)  Result Value Ref Range   Anit Nuclear Antibody(ANA) Negative Negative  Uric acid  Result Value Ref Range   Uric Acid 3.5 (L) 3.7 - 8.6 mg/dL  Thyroid Panel With TSH  Result Value Ref Range   TSH 0.372 (L) 0.450 - 4.500 uIU/mL   T4, Total 10.0 4.5 - 12.0 ug/dL   T3 Uptake Ratio 29 24 - 39 %   Free Thyroxine Index 2.9 1.2 - 4.9      Assessment & Plan:   Problem List Items Addressed This Visit    None    Visit Diagnoses    Upper respiratory tract infection, unspecified type    -  Primary   Will tx with zpack, tessalon, tussionex, and plain mucinex. Supportive care reviewed, f/u if worsening or no improvement.  Sedation precautions reviewed    Relevant Medications   azithromycin (ZITHROMAX) 250 MG tablet  Follow up plan: Return if symptoms worsen or fail to improve.

## 2017-01-21 NOTE — Patient Instructions (Signed)
Follow up as needed

## 2017-01-26 ENCOUNTER — Ambulatory Visit (INDEPENDENT_AMBULATORY_CARE_PROVIDER_SITE_OTHER): Payer: 59 | Admitting: Orthopaedic Surgery

## 2017-01-27 ENCOUNTER — Ambulatory Visit (INDEPENDENT_AMBULATORY_CARE_PROVIDER_SITE_OTHER): Payer: 59 | Admitting: Orthopaedic Surgery

## 2017-01-31 ENCOUNTER — Encounter: Payer: Self-pay | Admitting: Family Medicine

## 2017-02-02 ENCOUNTER — Ambulatory Visit (INDEPENDENT_AMBULATORY_CARE_PROVIDER_SITE_OTHER): Payer: 59 | Admitting: Orthopaedic Surgery

## 2017-02-02 ENCOUNTER — Encounter (INDEPENDENT_AMBULATORY_CARE_PROVIDER_SITE_OTHER): Payer: Self-pay | Admitting: Orthopaedic Surgery

## 2017-02-02 DIAGNOSIS — M19012 Primary osteoarthritis, left shoulder: Secondary | ICD-10-CM

## 2017-02-02 DIAGNOSIS — M19011 Primary osteoarthritis, right shoulder: Secondary | ICD-10-CM | POA: Diagnosis not present

## 2017-02-02 DIAGNOSIS — M1711 Unilateral primary osteoarthritis, right knee: Secondary | ICD-10-CM | POA: Diagnosis not present

## 2017-02-02 MED ORDER — DICLOFENAC SODIUM 1 % TD GEL: 2.0000 g | Freq: Four times a day (QID) | TRANSDERMAL | 5 refills | Status: DC

## 2017-02-02 MED ORDER — MELOXICAM 7.5 MG PO TABS: 7.5000 mg | ORAL_TABLET | Freq: Two times a day (BID) | ORAL | 2 refills | Status: DC | PRN
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
## 2017-02-07 NOTE — Telephone Encounter (Signed)
He's due now for his diabetes follow up. I will start him on ozempic, the weekly medicine for his diabetes. Thanks!

## 2017-02-13 ENCOUNTER — Telehealth (INDEPENDENT_AMBULATORY_CARE_PROVIDER_SITE_OTHER): Payer: Self-pay | Admitting: Orthopaedic Surgery

## 2017-02-13 NOTE — Telephone Encounter (Signed)
Meloxicam(MOBIC)7.5 mg tablet pt wife called and stated her husband is in a lot of pain. Pt would like to change his pain med. Pt still needs to work just looking for a med that can relief the pain. Please call pt wife when med is complete.

## 2017-02-13 NOTE — Telephone Encounter (Signed)
Please advise 

## 2017-02-13 NOTE — Telephone Encounter (Signed)
I'm happy to send in tramadol.  Just make sure he does not have a  history of seizures

## 2017-02-14 ENCOUNTER — Telehealth (INDEPENDENT_AMBULATORY_CARE_PROVIDER_SITE_OTHER): Payer: Self-pay | Admitting: Orthopaedic Surgery

## 2017-02-14 NOTE — Telephone Encounter (Signed)
Patient's wife Sharyn Lull) called and advised (Tramadol) is fine to seen to the [facility] employee pharmacy in [place].  Sharyn Lull asked if patient can take while working? Sharyn Lull also asked if he can take the Mobic along with Tramadol or not?  The number to contact Sharyn Lull is [phone]

## 2017-02-14 NOTE — Telephone Encounter (Signed)
What instructions and quantity would you like for me to call into pharm?

## 2017-02-14 NOTE — Telephone Encounter (Signed)
This is all ok to do

## 2017-02-14 NOTE — Telephone Encounter (Signed)
See message below °

## 2017-02-14 NOTE — Telephone Encounter (Signed)
Tramadol 50mg .  Take one tab po q 6hours prn pain. #40, no refill

## 2017-02-15 ENCOUNTER — Other Ambulatory Visit (INDEPENDENT_AMBULATORY_CARE_PROVIDER_SITE_OTHER): Payer: Self-pay

## 2017-02-15 ENCOUNTER — Ambulatory Visit (INDEPENDENT_AMBULATORY_CARE_PROVIDER_SITE_OTHER): Payer: Self-pay | Admitting: Physical Medicine and Rehabilitation

## 2017-02-15 MED ORDER — TRAMADOL HCL 50 MG PO TABS: ORAL_TABLET | ORAL | 0 refills | Status: DC

## 2017-02-15 NOTE — Telephone Encounter (Signed)
Called Into pharm

## 2017-02-15 NOTE — Telephone Encounter (Signed)
Yes patient would like the RX to be sent to the [facility] in [place]. Thank you.

## 2017-02-16 ENCOUNTER — Encounter (INDEPENDENT_AMBULATORY_CARE_PROVIDER_SITE_OTHER): Payer: Self-pay | Admitting: Physical Medicine and Rehabilitation

## 2017-02-16 ENCOUNTER — Encounter: Payer: Self-pay | Admitting: Family Medicine

## 2017-02-17 ENCOUNTER — Encounter (INDEPENDENT_AMBULATORY_CARE_PROVIDER_SITE_OTHER): Payer: Self-pay | Admitting: Physical Medicine and Rehabilitation

## 2017-02-17 ENCOUNTER — Ambulatory Visit (INDEPENDENT_AMBULATORY_CARE_PROVIDER_SITE_OTHER): Payer: 59 | Admitting: Physical Medicine and Rehabilitation

## 2017-02-17 ENCOUNTER — Ambulatory Visit (INDEPENDENT_AMBULATORY_CARE_PROVIDER_SITE_OTHER): Payer: 59

## 2017-02-17 DIAGNOSIS — G8929 Other chronic pain: Secondary | ICD-10-CM | POA: Diagnosis not present

## 2017-02-17 DIAGNOSIS — E1165 Type 2 diabetes mellitus with hyperglycemia: Secondary | ICD-10-CM

## 2017-02-17 DIAGNOSIS — M25511 Pain in right shoulder: Secondary | ICD-10-CM | POA: Diagnosis not present

## 2017-02-17 NOTE — Patient Instructions (Signed)
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
## 2017-02-17 NOTE — Progress Notes (Deleted)
Pt states a constant pain in both right and left shoulder, pt states right shoulder is worse than left shoulder. Pt states pain has been going on for about 1 month. Pt states laying flat and having arms above head makes worse, nothing makes it better. -Dye Allergies.

## 2017-02-20 MED ORDER — TRIAMCINOLONE ACETONIDE 40 MG/ML IJ SUSP
80.0000 mg | INTRAMUSCULAR | Status: AC | PRN
  Administered 2017-02-17: 80 mg via INTRA_ARTICULAR

## 2017-02-20 MED ORDER — BUPIVACAINE HCL 0.5 % IJ SOLN
3.0000 mL | INTRAMUSCULAR | Status: AC | PRN
  Administered 2017-02-17: 3 mL via INTRA_ARTICULAR

## 2017-02-21 ENCOUNTER — Ambulatory Visit (INDEPENDENT_AMBULATORY_CARE_PROVIDER_SITE_OTHER): Payer: 59 | Admitting: Family Medicine

## 2017-02-21 ENCOUNTER — Encounter: Payer: Self-pay | Admitting: Family Medicine

## 2017-02-21 VITALS — BP 135/85 | HR 84 | Temp 98.7°F | Wt 245.1 lb

## 2017-02-21 DIAGNOSIS — M1990 Unspecified osteoarthritis, unspecified site: Secondary | ICD-10-CM

## 2017-02-21 DIAGNOSIS — E782 Mixed hyperlipidemia: Secondary | ICD-10-CM

## 2017-02-21 DIAGNOSIS — E1165 Type 2 diabetes mellitus with hyperglycemia: Secondary | ICD-10-CM | POA: Diagnosis not present

## 2017-02-21 LAB — BAYER DCA HB A1C WAIVED: HB A1C (BAYER DCA - WAIVED): 10 % — ABNORMAL HIGH

## 2017-02-21 NOTE — Assessment & Plan Note (Signed)
Doing well with the orthopedist. Just had his injection in his shoulders. He is going to get his other shot in his shoulder in about 2 weeks.

## 2017-02-21 NOTE — Progress Notes (Signed)
BP 135/85 (BP Location: Left Arm, Patient Position: Sitting, Cuff Size: Large)   Pulse 84   Temp 98.7 F (37.1 C) (Oral)   Wt 245 lb 1 oz (111.2 kg)   SpO2 96%   BMI 32.48 kg/m    Subjective:    Patient ID: Tony Cardenas, male    DOB: 1970/05/25, 47 y.o.   MRN: [id number]  HPI: Tony Cardenas is a 47 y.o. male  Chief Complaint  Patient presents with  . Diabetes   DIABETES- has been back on his victoza, feeling better Hypoglycemic episodes:no Polydipsia/polyuria: no Visual disturbance: no Chest pain: no Paresthesias: no Glucose Monitoring: yes  Accucheck frequency: A few times a week Taking Insulin?: no Blood Pressure Monitoring: not checking Retinal Examination: Not up to Date Foot Exam: Done today Diabetic Education: Not Completed Pneumovax: Up to Date Influenza: Up to Date Aspirin: no  Had an injection in his shoulder a couple of days ago- feeling better with the pain in his shoulders. He is continuing to work with the orthopedist.   Relevant past medical, surgical, family and social history reviewed and updated as indicated. Interim medical history since our last visit reviewed. Allergies and medications reviewed and updated.  Review of Systems  Constitutional: Negative.   Respiratory: Negative.   Cardiovascular: Negative.   Gastrointestinal: Negative.   Psychiatric/Behavioral: Negative.     Per HPI unless specifically indicated above     Objective:    BP 135/85 (BP Location: Left Arm, Patient Position: Sitting, Cuff Size: Large)   Pulse 84   Temp 98.7 F (37.1 C) (Oral)   Wt 245 lb 1 oz (111.2 kg)   SpO2 96%   BMI 32.48 kg/m   Wt Readings from Last 3 Encounters:  02/21/17 245 lb 1 oz (111.2 kg)  01/18/17 253 lb 6.4 oz (114.9 kg)  12/20/16 255 lb 9 oz (115.9 kg)    Physical Exam  Constitutional: He is oriented to person, place, and time. He appears well-developed and well-nourished. No distress.  HENT:  Head: Normocephalic and  atraumatic.  Right Ear: Hearing normal.  Left Ear: Hearing normal.  Nose: Nose normal.  Eyes: Conjunctivae and lids are normal. Right eye exhibits no discharge. Left eye exhibits no discharge. No scleral icterus.  Cardiovascular: Normal rate, regular rhythm, normal heart sounds and intact distal pulses. Exam reveals no gallop and no friction rub.  No murmur heard. Pulmonary/Chest: Effort normal and breath sounds normal. No respiratory distress. He has no wheezes. He has no rales. He exhibits no tenderness.  Musculoskeletal: Normal range of motion.  Neurological: He is alert and oriented to person, place, and time.  Skin: Skin is warm, dry and intact. No rash noted. He is not diaphoretic. No erythema. No pallor.  Psychiatric: He has a normal mood and affect. His speech is normal and behavior is normal. Judgment and thought content normal. Cognition and memory are normal.  Nursing note and vitals reviewed.   Results for orders placed or performed in visit on 12/20/16  CBC with Differential/Platelet  Result Value Ref Range   WBC 8.0 3.4 - 10.8 x10E3/uL   RBC 4.77 4.14 - 5.80 x10E6/uL   Hemoglobin 14.6 13.0 - 17.7 g/dL   Hematocrit 41.9 37.5 - 51.0 %   MCV 88 79 - 97 fL   MCH 30.6 26.6 - 33.0 pg   MCHC 34.8 31.5 - 35.7 g/dL   RDW 13.1 12.3 - 15.4 %   Platelets 441 (H) 150 - 379 x10E3/uL  Neutrophils 56 Not Estab. %   Lymphs 34 Not Estab. %   Monocytes 6 Not Estab. %   Eos 2 Not Estab. %   Basos 1 Not Estab. %   Neutrophils Absolute 4.5 1.4 - 7.0 x10E3/uL   Lymphocytes Absolute 2.7 0.7 - 3.1 x10E3/uL   Monocytes Absolute 0.5 0.1 - 0.9 x10E3/uL   EOS (ABSOLUTE) 0.2 0.0 - 0.4 x10E3/uL   Basophils Absolute 0.1 0.0 - 0.2 x10E3/uL   Immature Granulocytes 1 Not Estab. %   Immature Grans (Abs) 0.0 0.0 - 0.1 x10E3/uL  Comprehensive metabolic panel  Result Value Ref Range   Glucose 193 (H) 65 - 99 mg/dL   BUN 9 6 - 24 mg/dL   Creatinine, Ser 0.64 (L) 0.76 - 1.27 mg/dL   GFR calc non Af  Amer 116 >59 mL/min/1.73   GFR calc Af Amer 134 >59 mL/min/1.73   BUN/Creatinine Ratio 14 9 - 20   Sodium 137 134 - 144 mmol/L   Potassium 4.5 3.5 - 5.2 mmol/L   Chloride 100 96 - 106 mmol/L   CO2 23 20 - 29 mmol/L   Calcium 9.1 8.7 - 10.2 mg/dL   Total Protein 7.2 6.0 - 8.5 g/dL   Albumin 4.4 3.5 - 5.5 g/dL   Globulin, Total 2.8 1.5 - 4.5 g/dL   Albumin/Globulin Ratio 1.6 1.2 - 2.2   Bilirubin Total 0.3 0.0 - 1.2 mg/dL   Alkaline Phosphatase 94 39 - 117 IU/L   AST 13 0 - 40 IU/L   ALT 17 0 - 44 IU/L  CK (Creatine Kinase)  Result Value Ref Range   Total CK 50 24 - 204 U/L  Sed Rate (ESR)  Result Value Ref Range   Sed Rate 58 (H) 0 - 15 mm/hr  Lyme Ab/Western Blot Reflex  Result Value Ref Range   Lyme IgG/IgM Ab <0.91 0.00 - 0.90 ISR   LYME DISEASE AB, QUANT, IGM <0.80 0.00 - 0.79 index  Rocky mtn spotted fvr abs pnl(IgG+IgM)  Result Value Ref Range   RMSF IgG Negative Negative   RMSF IgM 0.29 0.00 - 0.89 index  Babesia microti Antibody Panel  Result Value Ref Range   Babesia microti IgM <1:10 Neg:<1:10   Babesia microti IgG <7:12 WPY:<0:99  Ehrlichia Antibody Panel  Result Value Ref Range   E.Chaffeensis (HME) IgG Negative Neg:<1:64   E. Chaffeensis (HME) IgM Titer Negative Neg:<1:20   HGE IgG Titer Negative Neg:<1:64   HGE IgM Titer Negative Neg:<1:20  RA Qn+CCP(IgG/A)+SjoSSA+SjoSSB  Result Value Ref Range   Rhuematoid fact SerPl-aCnc <10.0 0.0 - 13.9 IU/mL   ENA SSA (RO) Ab <0.2 0.0 - 0.9 AI   ENA SSB (LA) Ab <0.2 0.0 - 0.9 AI   Cyclic Citrullin Peptide Ab 6 0 - 19 units  Antinuclear Antib (ANA)  Result Value Ref Range   Anit Nuclear Antibody(ANA) Negative Negative  Uric acid  Result Value Ref Range   Uric Acid 3.5 (L) 3.7 - 8.6 mg/dL  Thyroid Panel With TSH  Result Value Ref Range   TSH 0.372 (L) 0.450 - 4.500 uIU/mL   T4, Total 10.0 4.5 - 12.0 ug/dL   T3 Uptake Ratio 29 24 - 39 %   Free Thyroxine Index 2.9 1.2 - 4.9      Assessment & Plan:   Problem  List Items Addressed This Visit      Endocrine   Type 2 diabetes mellitus with hyperglycemia (HCC) - Primary    A1c up to 10.0-  unclear if this is accurate. Will send out A1c for confirmation. Changing from victoza to ozempic for ease of use. Recheck 3 months.       Relevant Orders   Bayer DCA Hb A1c Waived   Comprehensive metabolic panel   Hgb U6J w/o eAG     Musculoskeletal and Integument   Arthritis    Doing well with the orthopedist. Just had his injection in his shoulders. He is going to get his other shot in his shoulder in about 2 weeks.         Other   Hyperlipidemia    Rechecking levels today. Await results.       Relevant Orders   Comprehensive metabolic panel   Lipid Panel w/o Chol/HDL Ratio       Follow up plan: Return in about 4 weeks (around 03/21/2017) for knee injection.

## 2017-02-21 NOTE — Assessment & Plan Note (Signed)
A1c up to 10.0- unclear if this is accurate. Will send out A1c for confirmation. Changing from victoza to ozempic for ease of use. Recheck 3 months.

## 2017-02-21 NOTE — Assessment & Plan Note (Signed)
Rechecking levels today. Await results.  

## 2017-02-21 NOTE — Patient Instructions (Signed)
Ozempic 0.25mg  SQ weekly for 4 weeks, then increase 0.5mg  SQ weekly for 4 weeks, then 1mg  weekly after that

## 2017-02-22 LAB — COMPREHENSIVE METABOLIC PANEL
A/G RATIO: 1.5 (ref 1.2–2.2)
ALT: 15 IU/L (ref 0–44)
AST: 10 IU/L (ref 0–40)
Albumin: 4.6 g/dL (ref 3.5–5.5)
Alkaline Phosphatase: 79 IU/L (ref 39–117)
BUN/Creatinine Ratio: 19 (ref 9–20)
BUN: 12 mg/dL (ref 6–24)
Bilirubin Total: 0.2 mg/dL (ref 0.0–1.2)
CO2: 19 mmol/L — AB (ref 20–29)
Calcium: 9.6 mg/dL (ref 8.7–10.2)
Chloride: 99 mmol/L (ref 96–106)
Creatinine, Ser: 0.64 mg/dL — ABNORMAL LOW (ref 0.76–1.27)
GFR, EST AFRICAN AMERICAN: 134 mL/min/{1.73_m2} (ref >59)
GFR, EST NON AFRICAN AMERICAN: 116 mL/min/{1.73_m2} (ref >59)
Globulin, Total: 3 g/dL (ref 1.5–4.5)
Glucose: 225 mg/dL — ABNORMAL HIGH (ref 65–99)
POTASSIUM: 4.2 mmol/L (ref 3.5–5.2)
Sodium: 137 mmol/L (ref 134–144)
TOTAL PROTEIN: 7.6 g/dL (ref 6.0–8.5)

## 2017-02-22 LAB — HGB A1C W/O EAG: HEMOGLOBIN A1C: 10.3 % — AB (ref 4.8–5.6)

## 2017-02-22 LAB — LIPID PANEL W/O CHOL/HDL RATIO
Cholesterol, Total: 241 mg/dL — ABNORMAL HIGH (ref 100–199)
HDL: 39 mg/dL — AB (ref >39)
TRIGLYCERIDES: 525 mg/dL — AB (ref 0–149)

## 2017-02-28 ENCOUNTER — Telehealth (INDEPENDENT_AMBULATORY_CARE_PROVIDER_SITE_OTHER): Payer: Self-pay

## 2017-02-28 NOTE — Telephone Encounter (Signed)
Patient would like a Rx refill on Tramadol.  Cb# is [phone].  Please advise.  Thank you.

## 2017-02-28 NOTE — Telephone Encounter (Signed)
Please advise 

## 2017-03-01 ENCOUNTER — Telehealth (INDEPENDENT_AMBULATORY_CARE_PROVIDER_SITE_OTHER): Payer: Self-pay | Admitting: Orthopaedic Surgery

## 2017-03-01 ENCOUNTER — Other Ambulatory Visit (INDEPENDENT_AMBULATORY_CARE_PROVIDER_SITE_OTHER): Payer: Self-pay

## 2017-03-01 MED ORDER — TRAMADOL HCL 50 MG PO TABS: ORAL_TABLET | ORAL | 0 refills | Status: DC

## 2017-03-01 MED ORDER — MELOXICAM 7.5 MG PO TABS: 7.5000 mg | ORAL_TABLET | Freq: Two times a day (BID) | ORAL | 0 refills | Status: DC | PRN

## 2017-03-01 NOTE — Telephone Encounter (Signed)
Patient aware both Rx were sent to pharm'

## 2017-03-01 NOTE — Telephone Encounter (Signed)
Rx called into pharm 

## 2017-03-01 NOTE — Telephone Encounter (Signed)
Patient's wife Sharyn Lull called asked if Dr. Erlinda Hong will write the Rx ( Meloxicam) for 60 tabs.  Patient can take twice a day so that he will not run out. The number to contact patient [phone]

## 2017-03-01 NOTE — Telephone Encounter (Signed)
Rx sent in to pharm 

## 2017-03-01 NOTE — Telephone Encounter (Signed)
See message below °

## 2017-03-01 NOTE — Telephone Encounter (Signed)
Ok to call in.  50mg , one tab po q6 hours prn pain. #40, no refills

## 2017-03-01 NOTE — Telephone Encounter (Signed)
Ok to do

## 2017-03-03 ENCOUNTER — Other Ambulatory Visit: Payer: Self-pay | Admitting: Family Medicine

## 2017-03-03 ENCOUNTER — Encounter: Payer: Self-pay | Admitting: Family Medicine

## 2017-03-03 MED ORDER — LISINOPRIL 5 MG PO TABS: 5.0000 mg | ORAL_TABLET | Freq: Every day | ORAL | 1 refills | Status: DC

## 2017-03-03 MED ORDER — ATORVASTATIN CALCIUM 80 MG PO TABS: 80.0000 mg | ORAL_TABLET | Freq: Every day | ORAL | 1 refills | Status: DC

## 2017-03-05 ENCOUNTER — Encounter: Payer: Self-pay | Admitting: Family Medicine

## 2017-03-06 ENCOUNTER — Encounter (INDEPENDENT_AMBULATORY_CARE_PROVIDER_SITE_OTHER): Payer: Self-pay | Admitting: Physical Medicine and Rehabilitation

## 2017-03-06 ENCOUNTER — Ambulatory Visit (INDEPENDENT_AMBULATORY_CARE_PROVIDER_SITE_OTHER): Payer: 59 | Admitting: Physical Medicine and Rehabilitation

## 2017-03-06 ENCOUNTER — Ambulatory Visit (INDEPENDENT_AMBULATORY_CARE_PROVIDER_SITE_OTHER): Payer: 59

## 2017-03-06 DIAGNOSIS — G8929 Other chronic pain: Secondary | ICD-10-CM | POA: Diagnosis not present

## 2017-03-06 DIAGNOSIS — M25512 Pain in left shoulder: Secondary | ICD-10-CM | POA: Diagnosis not present

## 2017-03-06 MED ORDER — TRIAMCINOLONE ACETONIDE 40 MG/ML IJ SUSP
80.0000 mg | INTRAMUSCULAR | Status: AC | PRN
  Administered 2017-03-06: 80 mg via INTRA_ARTICULAR

## 2017-03-06 MED ORDER — BUPIVACAINE HCL 0.5 % IJ SOLN
3.0000 mL | INTRAMUSCULAR | Status: AC | PRN
  Administered 2017-03-06: 3 mL via INTRA_ARTICULAR

## 2017-03-06 NOTE — Patient Instructions (Signed)

## 2017-03-06 NOTE — Progress Notes (Signed)
Pt states a sharp pain in left shoulder. Pt states pain started about 6 months ago. Pt states using his left shoulder makes pain worse, and pain meds knock the edge off. -Dye Allergies.
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
## 2017-03-13 ENCOUNTER — Telehealth (INDEPENDENT_AMBULATORY_CARE_PROVIDER_SITE_OTHER): Payer: Self-pay | Admitting: Orthopaedic Surgery

## 2017-03-13 NOTE — Telephone Encounter (Signed)
Patient's wife Sharyn Lull) called advised patient needing Rx refilled (Tramadol) The number to contact Sharyn Lull is [phone] or [phone]

## 2017-03-13 NOTE — Telephone Encounter (Signed)
Patient uses [facility] in [place].  Thank you.

## 2017-03-14 ENCOUNTER — Other Ambulatory Visit (INDEPENDENT_AMBULATORY_CARE_PROVIDER_SITE_OTHER): Payer: Self-pay

## 2017-03-14 MED ORDER — TRAMADOL HCL 50 MG PO TABS: ORAL_TABLET | ORAL | 0 refills | Status: DC

## 2017-03-14 NOTE — Telephone Encounter (Signed)
Called Rx into pharm

## 2017-03-14 NOTE — Telephone Encounter (Signed)
30

## 2017-03-14 NOTE — Telephone Encounter (Signed)
Pt aware.

## 2017-03-14 NOTE — Telephone Encounter (Signed)
Is this okay?

## 2017-03-14 NOTE — Telephone Encounter (Signed)
Patients wife checking on status of RX refill of tramadol to be called into [facility] in [place]. Please call patient once called in # [phone]

## 2017-03-15 ENCOUNTER — Encounter: Payer: Self-pay | Admitting: Family Medicine

## 2017-03-22 ENCOUNTER — Telehealth (INDEPENDENT_AMBULATORY_CARE_PROVIDER_SITE_OTHER): Payer: Self-pay | Admitting: Orthopaedic Surgery

## 2017-03-22 ENCOUNTER — Other Ambulatory Visit (INDEPENDENT_AMBULATORY_CARE_PROVIDER_SITE_OTHER): Payer: Self-pay

## 2017-03-22 MED ORDER — TRAMADOL HCL 50 MG PO TABS: ORAL_TABLET | ORAL | 0 refills | Status: DC

## 2017-03-22 NOTE — Telephone Encounter (Signed)
Please advise 

## 2017-03-22 NOTE — Telephone Encounter (Signed)
RX called into pharm. Patient aware.

## 2017-03-22 NOTE — Telephone Encounter (Signed)
Patient requesting RX refill on Tramadol. Patient has one pill left if its possible to send it to the [facility] today, please advise patient once called in # [phone]

## 2017-03-22 NOTE — Telephone Encounter (Signed)
Yes #30

## 2017-03-23 ENCOUNTER — Ambulatory Visit (INDEPENDENT_AMBULATORY_CARE_PROVIDER_SITE_OTHER): Payer: 59 | Admitting: Family Medicine

## 2017-03-23 ENCOUNTER — Encounter: Payer: Self-pay | Admitting: Family Medicine

## 2017-03-23 VITALS — BP 132/87 | HR 82 | Temp 99.1°F | Wt 240.6 lb

## 2017-03-23 DIAGNOSIS — M1990 Unspecified osteoarthritis, unspecified site: Secondary | ICD-10-CM | POA: Diagnosis not present

## 2017-03-23 NOTE — Assessment & Plan Note (Signed)
Not improving with conservative therapy. Steroid injection done today as below.

## 2017-03-23 NOTE — Progress Notes (Signed)
BP 132/87   Pulse 82   Temp 99.1 F (37.3 C) (Oral)   Wt 240 lb 9.6 oz (109.1 kg)   SpO2 97%   BMI 31.89 kg/m    Subjective:    Patient ID: Tony Cardenas, male    DOB: 14-Mar-1970, 47 y.o.   MRN: [id number]  HPI: Tony Cardenas is a 47 y.o. male  Chief Complaint  Patient presents with  . Knee Pain    injection   Starting to feel a bit better. Sugars seem be doing a bit better. Has been taking his medicine. Joints still really hurt. Still seeing his orthopedist- following with him for his shoulders and will see him for other issues with his joints as well.   KNEE PAIN Duration: chronic Involved knee: left Mechanism of injury: unknown Location:diffuse Onset: gradual Severity: severe  Quality:  aching Frequency: constant Radiation: no Aggravating factors: weight bearing, walking, running and stairs  Alleviating factors: ice, physical therapy, HEP, APAP and NSAIDs  Status: worse Treatments attempted: rest, ice, heat, APAP, ibuprofen and aleve  Relief with NSAIDs?:  moderate Weakness with weight bearing or walking: no Sensation of giving way: no Locking: yes Popping: no Bruising: no Swelling: yes Redness: no Paresthesias/decreased sensation: no Fevers: no  Relevant past medical, surgical, family and social history reviewed and updated as indicated. Interim medical history since our last visit reviewed. Allergies and medications reviewed and updated.  Review of Systems  Constitutional: Negative.   Respiratory: Negative.   Cardiovascular: Negative.   Musculoskeletal: Positive for arthralgias and joint swelling. Negative for back pain, gait problem, myalgias, neck pain and neck stiffness.  Skin: Negative.   Psychiatric/Behavioral: Negative.     Per HPI unless specifically indicated above     Objective:    BP 132/87   Pulse 82   Temp 99.1 F (37.3 C) (Oral)   Wt 240 lb 9.6 oz (109.1 kg)   SpO2 97%   BMI 31.89 kg/m   Wt Readings from Last 3  Encounters:  03/23/17 240 lb 9.6 oz (109.1 kg)  02/21/17 245 lb 1 oz (111.2 kg)  01/18/17 253 lb 6.4 oz (114.9 kg)    Physical Exam  Constitutional: He is oriented to person, place, and time. He appears well-developed and well-nourished. No distress.  HENT:  Head: Normocephalic and atraumatic.  Right Ear: Hearing normal.  Left Ear: Hearing normal.  Nose: Nose normal.  Eyes: Conjunctivae and lids are normal. Right eye exhibits no discharge. Left eye exhibits no discharge. No scleral icterus.  Cardiovascular: Normal rate, regular rhythm, normal heart sounds and intact distal pulses. Exam reveals no gallop and no friction rub.  No murmur heard. Pulmonary/Chest: Effort normal and breath sounds normal. No respiratory distress. He has no wheezes. He has no rales. He exhibits no tenderness.  Musculoskeletal: He exhibits tenderness (along joint line of L knee, negative apply's compression and distraction, negative anterior and posterior drawer tests, negative McMurrays ). He exhibits no edema or deformity.  Neurological: He is alert and oriented to person, place, and time.  Skin: Skin is warm, dry and intact. No rash noted. He is not diaphoretic. No erythema. No pallor.  Psychiatric: He has a normal mood and affect. His speech is normal and behavior is normal. Judgment and thought content normal. Cognition and memory are normal.  Nursing note and vitals reviewed.   Results for orders placed or performed in visit on 02/21/17  Bayer DCA Hb A1c Waived  Result Value Ref Range  Bayer DCA Hb A1c Waived 10.0 (H) <7.0 %  Comprehensive metabolic panel  Result Value Ref Range   Glucose 225 (H) 65 - 99 mg/dL   BUN 12 6 - 24 mg/dL   Creatinine, Ser 0.64 (L) 0.76 - 1.27 mg/dL   GFR calc non Af Amer 116 >59 mL/min/1.73   GFR calc Af Amer 134 >59 mL/min/1.73   BUN/Creatinine Ratio 19 9 - 20   Sodium 137 134 - 144 mmol/L   Potassium 4.2 3.5 - 5.2 mmol/L   Chloride 99 96 - 106 mmol/L   CO2 19 (L) 20 -  29 mmol/L   Calcium 9.6 8.7 - 10.2 mg/dL   Total Protein 7.6 6.0 - 8.5 g/dL   Albumin 4.6 3.5 - 5.5 g/dL   Globulin, Total 3.0 1.5 - 4.5 g/dL   Albumin/Globulin Ratio 1.5 1.2 - 2.2   Bilirubin Total 0.2 0.0 - 1.2 mg/dL   Alkaline Phosphatase 79 39 - 117 IU/L   AST 10 0 - 40 IU/L   ALT 15 0 - 44 IU/L  Lipid Panel w/o Chol/HDL Ratio  Result Value Ref Range   Cholesterol, Total 241 (H) 100 - 199 mg/dL   Triglycerides 525 (H) 0 - 149 mg/dL   HDL 39 (L) >39 mg/dL   VLDL Cholesterol Cal Comment 5 - 40 mg/dL   LDL Calculated Comment 0 - 99 mg/dL  Hgb A1c w/o eAG  Result Value Ref Range   Hgb A1c MFr Bld 10.3 (H) 4.8 - 5.6 %      Assessment & Plan:   Problem List Items Addressed This Visit      Musculoskeletal and Integument   Arthritis    Not improving with conservative therapy. Steroid injection done today as below.         Procedure: Left  Knee Intraarticular Steroid Injection        Diagnosis:   ICD-10-CM   1. Arthritis M19.90     Physician: MJ Consent:  Risks, benefits, and alternative treatments discussed and all questions were answered.  Patient elected to proceed and verbal consent obtained.  Description: Area prepped and draped using  semi-sterile technique.  Using a anterior/lateral approach, a mixture of 9 cc of  1% lidocaine & 1 cc of Kenalog 40 was injected into knee joint.  A bandage was then placed over the injection site. Complications: none Post Procedure Instructions: Wound care instructions discussed and patient was instructed to keep area clean and dry.  Signs and symptoms of infection discussed, patient agrees to contact the office ASAP should they occur.  Follow Up: Return May , for Diabetes follow up.    Follow up plan: Return May , for Diabetes follow up.

## 2017-03-29 ENCOUNTER — Other Ambulatory Visit (INDEPENDENT_AMBULATORY_CARE_PROVIDER_SITE_OTHER): Payer: Self-pay

## 2017-03-29 ENCOUNTER — Telehealth (INDEPENDENT_AMBULATORY_CARE_PROVIDER_SITE_OTHER): Payer: Self-pay | Admitting: Orthopaedic Surgery

## 2017-03-29 MED ORDER — TRAMADOL HCL 50 MG PO TABS: ORAL_TABLET | ORAL | 0 refills | Status: DC

## 2017-03-29 NOTE — Telephone Encounter (Signed)
Rx called into pharm 

## 2017-03-29 NOTE — Telephone Encounter (Signed)
30

## 2017-03-29 NOTE — Telephone Encounter (Signed)
Pt aware.

## 2017-03-29 NOTE — Telephone Encounter (Signed)
Please advise 

## 2017-03-29 NOTE — Telephone Encounter (Signed)
Patient called asking for a refill on Tramadol. CB # [phone]

## 2017-04-05 ENCOUNTER — Other Ambulatory Visit (INDEPENDENT_AMBULATORY_CARE_PROVIDER_SITE_OTHER): Payer: Self-pay | Admitting: Orthopaedic Surgery

## 2017-04-05 NOTE — Telephone Encounter (Signed)
Patient's wife called requesting an RX refill on patient's tramadol.  Patient uses [facility].  CB#[phone].  Thank you.

## 2017-04-06 NOTE — Telephone Encounter (Signed)
Please advise 

## 2017-04-06 NOTE — Telephone Encounter (Signed)
Patients wife called again, stating that her husband will be out of the tramadol today and is really needing it to be filled as soon as possible. Thank you

## 2017-04-06 NOTE — Telephone Encounter (Signed)
60

## 2017-04-06 NOTE — Telephone Encounter (Signed)
Called patient to let him know Dr Erlinda Hong has not responded to message yet.  Pending response.

## 2017-04-07 ENCOUNTER — Encounter: Payer: Self-pay | Admitting: Family Medicine

## 2017-04-07 ENCOUNTER — Other Ambulatory Visit (INDEPENDENT_AMBULATORY_CARE_PROVIDER_SITE_OTHER): Payer: Self-pay

## 2017-04-07 ENCOUNTER — Other Ambulatory Visit: Payer: Self-pay | Admitting: Family Medicine

## 2017-04-07 ENCOUNTER — Ambulatory Visit (INDEPENDENT_AMBULATORY_CARE_PROVIDER_SITE_OTHER): Payer: 59 | Admitting: Family Medicine

## 2017-04-07 VITALS — BP 144/82 | HR 101 | Temp 98.2°F | Wt 234.6 lb

## 2017-04-07 DIAGNOSIS — M2550 Pain in unspecified joint: Secondary | ICD-10-CM | POA: Diagnosis not present

## 2017-04-07 MED ORDER — TRAMADOL HCL 50 MG PO TABS: ORAL_TABLET | ORAL | 0 refills | Status: DC

## 2017-04-07 NOTE — Telephone Encounter (Signed)
[facility] pharm and LMVM for them with Rx refill #[phone]

## 2017-04-07 NOTE — Addendum Note (Signed)
Addended by: Brand Males E on: 04/07/2017 02:50 PM   Modules accepted: Orders

## 2017-04-07 NOTE — Progress Notes (Signed)
BP (!) 144/82 (BP Location: Left Arm, Patient Position: Sitting, Cuff Size: Normal)   Pulse (!) 101   Temp 98.2 F (36.8 C)   Wt 234 lb 9 oz (106.4 kg)   SpO2 98%   BMI 31.09 kg/m    Subjective:    Patient ID: Tony Cardenas, male    DOB: [birth year], 47 y.o.   MRN: [id number]  HPI: Tony Cardenas is a 47 y.o. male  Chief Complaint  Patient presents with  . Joint Pain  . Muscle Pain   Went back on his diabetes medicine a couple of months ago. Had been feeling better until about a week to 2 weeks ago. Has been feeling progressively worse every day. He notes that he feels worse when he has a bad day at work. Has been having severe joint pain. Has been having weakness. Notes that sometimes he cannot get out of bed because the pain is so bad. He feels like all his joints are hurting.   ARTHRALGIAS / JOINT ACHES Duration: about a week Pain: yes Symmetric: yes Severity: severe Quality: aching, pain Frequency: constant Context:  worse Decreased function/range of motion: yes  Erythema: no Swelling: no Heat or warmth: no Morning stiffness: yes Relief with NSAIDs?: no Treatments attempted:  rest, ice, heat, APAP, ibuprofen and aleve  Involved Joints:     Hands: yes bilateral    Wrists: yes right     Elbows: no     Shoulders: yes right    Back: yes     Hips: yes bilateral    Knees: yes bilateral    Ankles: no     Feet: no   Relevant past medical, surgical, family and social history reviewed and updated as indicated. Interim medical history since our last visit reviewed. Allergies and medications reviewed and updated.  Review of Systems  Constitutional: Positive for fatigue. Negative for activity change, appetite change, chills, diaphoresis, fever and unexpected weight change.  Respiratory: Negative.   Cardiovascular: Negative.   Musculoskeletal: Positive for arthralgias, back pain and myalgias. Negative for gait problem, joint swelling, neck pain and neck  stiffness.  Skin: Negative.   Neurological: Positive for weakness. Negative for dizziness, tremors, seizures, syncope, facial asymmetry, speech difficulty, light-headedness, numbness and headaches.  Psychiatric/Behavioral: Positive for dysphoric mood. Negative for agitation, behavioral problems, confusion, decreased concentration, hallucinations, self-injury, sleep disturbance and suicidal ideas. The patient is not nervous/anxious and is not hyperactive.     Per HPI unless specifically indicated above     Objective:    BP (!) 144/82 (BP Location: Left Arm, Patient Position: Sitting, Cuff Size: Normal)   Pulse (!) 101   Temp 98.2 F (36.8 C)   Wt 234 lb 9 oz (106.4 kg)   SpO2 98%   BMI 31.09 kg/m   Wt Readings from Last 3 Encounters:  04/07/17 234 lb 9 oz (106.4 kg)  03/23/17 240 lb 9.6 oz (109.1 kg)  02/21/17 245 lb 1 oz (111.2 kg)    Physical Exam  Constitutional: He is oriented to person, place, and time. He appears well-developed and well-nourished. No distress.  visibly upset  HENT:  Head: Normocephalic and atraumatic.  Right Ear: Hearing normal.  Left Ear: Hearing normal.  Nose: Nose normal.  Eyes: Conjunctivae and lids are normal. Right eye exhibits no discharge. Left eye exhibits no discharge. No scleral icterus.  Cardiovascular: Normal rate, regular rhythm, normal heart sounds and intact distal pulses. Exam reveals no gallop and no friction rub.  No  murmur heard. Pulmonary/Chest: Effort normal and breath sounds normal. No respiratory distress. He has no wheezes. He has no rales. He exhibits no tenderness.  Musculoskeletal: Normal range of motion.  Neurological: He is alert and oriented to person, place, and time.  Skin: Skin is warm, dry and intact. No rash noted. He is not diaphoretic. No erythema. No pallor.  Psychiatric: He has a normal mood and affect. His speech is normal and behavior is normal. Judgment and thought content normal. Cognition and memory are normal.    Nursing note and vitals reviewed.   Results for orders placed or performed in visit on 02/21/17  Bayer DCA Hb A1c Waived  Result Value Ref Range   Bayer DCA Hb A1c Waived 10.0 (H) <7.0 %  Comprehensive metabolic panel  Result Value Ref Range   Glucose 225 (H) 65 - 99 mg/dL   BUN 12 6 - 24 mg/dL   Creatinine, Ser 0.64 (L) 0.76 - 1.27 mg/dL   GFR calc non Af Amer 116 >59 mL/min/1.73   GFR calc Af Amer 134 >59 mL/min/1.73   BUN/Creatinine Ratio 19 9 - 20   Sodium 137 134 - 144 mmol/L   Potassium 4.2 3.5 - 5.2 mmol/L   Chloride 99 96 - 106 mmol/L   CO2 19 (L) 20 - 29 mmol/L   Calcium 9.6 8.7 - 10.2 mg/dL   Total Protein 7.6 6.0 - 8.5 g/dL   Albumin 4.6 3.5 - 5.5 g/dL   Globulin, Total 3.0 1.5 - 4.5 g/dL   Albumin/Globulin Ratio 1.5 1.2 - 2.2   Bilirubin Total 0.2 0.0 - 1.2 mg/dL   Alkaline Phosphatase 79 39 - 117 IU/L   AST 10 0 - 40 IU/L   ALT 15 0 - 44 IU/L  Lipid Panel w/o Chol/HDL Ratio  Result Value Ref Range   Cholesterol, Total 241 (H) 100 - 199 mg/dL   Triglycerides 525 (H) 0 - 149 mg/dL   HDL 39 (L) >39 mg/dL   VLDL Cholesterol Cal Comment 5 - 40 mg/dL   LDL Calculated Comment 0 - 99 mg/dL  Hgb A1c w/o eAG  Result Value Ref Range   Hgb A1c MFr Bld 10.3 (H) 4.8 - 5.6 %      Assessment & Plan:   Problem List Items Addressed This Visit    None    Visit Diagnoses    Arthralgia, unspecified joint    -  Primary   Has worsened. Concerned for statin-induced myalgia. Stop atorvastatin. Will check labs again. Really wants to see rheum- referral. x-rays. Await results.    Relevant Orders   Ambulatory referral to Rheumatology   Comprehensive metabolic panel   CK (Creatine Kinase)   Antinuclear Antib (ANA)   Rocky mtn spotted fvr abs pnl(IgG+IgM)   Babesia microti Antibody Panel   Lyme Ab/Western Blot Reflex   Ehrlichia Antibody Panel   RA Qn+CCP(IgG/A)+SjoSSA+SjoSSB   DG Hand Complete Left   DG Hand Complete Right       Follow up plan: Return if symptoms  worsen or fail to improve.

## 2017-04-09 ENCOUNTER — Encounter: Payer: Self-pay | Admitting: Family Medicine

## 2017-04-11 LAB — COMPREHENSIVE METABOLIC PANEL
A/G RATIO: 1.4 (ref 1.2–2.2)
ALT: 19 IU/L (ref 0–44)
AST: 11 IU/L (ref 0–40)
Albumin: 4.1 g/dL (ref 3.5–5.5)
Alkaline Phosphatase: 108 IU/L (ref 39–117)
BUN/Creatinine Ratio: 10 (ref 9–20)
BUN: 8 mg/dL (ref 6–24)
Bilirubin Total: 0.3 mg/dL (ref 0.0–1.2)
CALCIUM: 9.5 mg/dL (ref 8.7–10.2)
CO2: 20 mmol/L (ref 20–29)
Chloride: 101 mmol/L (ref 96–106)
Creatinine, Ser: 0.79 mg/dL (ref 0.76–1.27)
GFR calc non Af Amer: 106 mL/min/{1.73_m2} (ref >59)
GFR, EST AFRICAN AMERICAN: 123 mL/min/{1.73_m2} (ref >59)
GLOBULIN, TOTAL: 2.9 g/dL (ref 1.5–4.5)
Glucose: 223 mg/dL — ABNORMAL HIGH (ref 65–99)
POTASSIUM: 4.2 mmol/L (ref 3.5–5.2)
SODIUM: 140 mmol/L (ref 134–144)
TOTAL PROTEIN: 7 g/dL (ref 6.0–8.5)

## 2017-04-11 LAB — BABESIA MICROTI ANTIBODY PANEL: Babesia microti IgM: <1:10 {titer}

## 2017-04-11 LAB — LYME AB/WESTERN BLOT REFLEX: Lyme IgG/IgM Ab: <0.91 {ISR} (ref 0.00–0.90)

## 2017-04-11 LAB — ANA: Anti Nuclear Antibody(ANA): NEGATIVE

## 2017-04-11 LAB — EHRLICHIA ANTIBODY PANEL
E. Chaffeensis (HME) IgM Titer: NEGATIVE
E.Chaffeensis (HME) IgG: NEGATIVE
HGE IGG TITER: NEGATIVE
HGE IgM Titer: NEGATIVE

## 2017-04-11 LAB — ROCKY MTN SPOTTED FVR ABS PNL(IGG+IGM)
RMSF IgG: NEGATIVE
RMSF IgM: 0.24 index (ref 0.00–0.89)

## 2017-04-11 LAB — CK: Total CK: 33 U/L (ref 24–204)

## 2017-04-11 LAB — RA QN+CCP(IGG/A)+SJOSSA+SJOSSB
Cyclic Citrullin Peptide Ab: 5 units (ref 0–19)
ENA SSA (RO) Ab: <0.2 AI (ref 0.0–0.9)
ENA SSB (LA) Ab: <0.2 AI (ref 0.0–0.9)
Rhuematoid fact SerPl-aCnc: 11.6 IU/mL (ref 0.0–13.9)

## 2017-04-12 ENCOUNTER — Encounter: Payer: Self-pay | Admitting: Family Medicine

## 2017-04-13 ENCOUNTER — Other Ambulatory Visit: Payer: Self-pay | Admitting: Family Medicine

## 2017-04-13 MED ORDER — PREDNISONE 20 MG PO TABS: 20.0000 mg | ORAL_TABLET | Freq: Every day | ORAL | 0 refills | Status: DC

## 2017-04-19 ENCOUNTER — Other Ambulatory Visit (INDEPENDENT_AMBULATORY_CARE_PROVIDER_SITE_OTHER): Payer: Self-pay | Admitting: Orthopaedic Surgery

## 2017-04-19 NOTE — Telephone Encounter (Signed)
Please advise 

## 2017-04-19 NOTE — Telephone Encounter (Signed)
Yes #30

## 2017-04-19 NOTE — Telephone Encounter (Signed)
Patient's wife called requesting an RX refill for his Tramadol.  CB#[phone].  Thank you.

## 2017-04-20 ENCOUNTER — Telehealth: Payer: Self-pay | Admitting: Family Medicine

## 2017-04-20 ENCOUNTER — Ambulatory Visit (INDEPENDENT_AMBULATORY_CARE_PROVIDER_SITE_OTHER): Payer: 59 | Admitting: Family Medicine

## 2017-04-20 ENCOUNTER — Encounter: Payer: Self-pay | Admitting: Family Medicine

## 2017-04-20 VITALS — BP 144/91 | HR 115 | Temp 98.5°F | Wt 232.5 lb

## 2017-04-20 DIAGNOSIS — R030 Elevated blood-pressure reading, without diagnosis of hypertension: Secondary | ICD-10-CM | POA: Diagnosis not present

## 2017-04-20 DIAGNOSIS — M2550 Pain in unspecified joint: Secondary | ICD-10-CM

## 2017-04-20 MED ORDER — PREDNISONE 20 MG PO TABS: 20.0000 mg | ORAL_TABLET | Freq: Every day | ORAL | 0 refills | Status: DC

## 2017-04-20 MED ORDER — TRAMADOL HCL 50 MG PO TABS: ORAL_TABLET | ORAL | 0 refills | Status: DC

## 2017-04-20 NOTE — Telephone Encounter (Signed)
Copied from [place]. Topic: Quick Communication - See Telephone Encounter >> Apr 20, 2017  8:45 AM Margot Ables wrote: Pt is still in a lot of pain. The tramadol and prednisone are not working. He is having all the same issues with no improvement. Pt cannot sleep. Pt is not able to work. Pt wife requesting msg to be sent as high priority. Please call pt cell phone to advise.

## 2017-04-20 NOTE — Telephone Encounter (Signed)
Patient is being seen by Dr.Johnson.

## 2017-04-20 NOTE — Telephone Encounter (Signed)
[facility] LMVM with Rx,

## 2017-04-20 NOTE — Progress Notes (Signed)
BP (!) 144/91 (BP Location: Left Arm, Patient Position: Sitting, Cuff Size: Large)   Pulse (!) 115   Temp 98.5 F (36.9 C)   Wt 232 lb 8 oz (105.5 kg)   SpO2 96%   BMI 30.81 kg/m    Subjective:    Patient ID: Tony Cardenas, male    DOB: 02-15-1970, 47 y.o.   MRN: [id number]  HPI: Tony Cardenas is a 47 y.o. male  Chief Complaint  Patient presents with  . Joint Pain   Did better when he was on the prednisone and has been feeling worse since coming off of it. He notes that he doesn't feel any different since coming off the atorvastatin. Continues with severe joint and continues with pain in the muscles in the back of his legs. He notes that he did better on the prednisone, but never notices that it ever went away. He notes that he will lay in his chair and he notes that when he wakes up, he "can't move his hands". He notes that he has a tight feeling in his hands and feels like he can't close his fist.   HYPERTENSION Hypertension status: uncontrolled  Satisfied with current treatment? no Duration of hypertension: chronic BP monitoring frequency:  not checking BP medication side effects:  no Medication compliance: poor compliance Previous BP meds: lisinopril- stopped taking it again Aspirin: no Recurrent headaches: no Visual changes: no Palpitations: no Dyspnea: no Chest pain: no Lower extremity edema: no Dizzy/lightheaded: no  Relevant past medical, surgical, family and social history reviewed and updated as indicated. Interim medical history since our last visit reviewed. Allergies and medications reviewed and updated.  Review of Systems  Constitutional: Negative.   Respiratory: Negative.   Cardiovascular: Negative.   Musculoskeletal: Positive for arthralgias, back pain and myalgias. Negative for gait problem, joint swelling, neck pain and neck stiffness.  Skin: Negative.   Neurological: Negative.   Psychiatric/Behavioral: Negative.     Per HPI unless  specifically indicated above     Objective:    BP (!) 144/91 (BP Location: Left Arm, Patient Position: Sitting, Cuff Size: Large)   Pulse (!) 115   Temp 98.5 F (36.9 C)   Wt 232 lb 8 oz (105.5 kg)   SpO2 96%   BMI 30.81 kg/m   Wt Readings from Last 3 Encounters:  04/20/17 232 lb 8 oz (105.5 kg)  04/07/17 234 lb 9 oz (106.4 kg)  03/23/17 240 lb 9.6 oz (109.1 kg)    Physical Exam  Constitutional: He is oriented to person, place, and time. He appears well-developed and well-nourished. No distress.  HENT:  Head: Normocephalic and atraumatic.  Right Ear: Hearing normal.  Left Ear: Hearing normal.  Nose: Nose normal.  Eyes: Conjunctivae and lids are normal. Right eye exhibits no discharge. Left eye exhibits no discharge. No scleral icterus.  Cardiovascular: Normal rate, regular rhythm, normal heart sounds and intact distal pulses. Exam reveals no gallop and no friction rub.  No murmur heard. Pulmonary/Chest: Effort normal and breath sounds normal. No stridor. No respiratory distress. He has no wheezes. He has no rales. He exhibits no tenderness.  Musculoskeletal: Normal range of motion. He exhibits no edema, tenderness or deformity.  Neurological: He is alert and oriented to person, place, and time.  Skin: Skin is warm, dry and intact. Capillary refill takes less than 2 seconds. No rash noted. He is not diaphoretic. No erythema. No pallor.  Psychiatric: He has a normal mood and affect. His  speech is normal and behavior is normal. Judgment and thought content normal. Cognition and memory are normal.  Nursing note and vitals reviewed.   Results for orders placed or performed in visit on 04/07/17  RA Qn+CCP(IgG/A)+SjoSSA+SjoSSB  Result Value Ref Range   Rhuematoid fact SerPl-aCnc 11.6 0.0 - 13.9 IU/mL   ENA SSA (RO) Ab <0.2 0.0 - 0.9 AI   ENA SSB (LA) Ab <0.2 0.0 - 0.9 AI   Cyclic Citrullin Peptide Ab 5 0 - 19 units  Comprehensive metabolic panel  Result Value Ref Range   Glucose  223 (H) 65 - 99 mg/dL   BUN 8 6 - 24 mg/dL   Creatinine, Ser 0.79 0.76 - 1.27 mg/dL   GFR calc non Af Amer 106 >59 mL/min/1.73   GFR calc Af Amer 123 >59 mL/min/1.73   BUN/Creatinine Ratio 10 9 - 20   Sodium 140 134 - 144 mmol/L   Potassium 4.2 3.5 - 5.2 mmol/L   Chloride 101 96 - 106 mmol/L   CO2 20 20 - 29 mmol/L   Calcium 9.5 8.7 - 10.2 mg/dL   Total Protein 7.0 6.0 - 8.5 g/dL   Albumin 4.1 3.5 - 5.5 g/dL   Globulin, Total 2.9 1.5 - 4.5 g/dL   Albumin/Globulin Ratio 1.4 1.2 - 2.2   Bilirubin Total 0.3 0.0 - 1.2 mg/dL   Alkaline Phosphatase 108 39 - 117 IU/L   AST 11 0 - 40 IU/L   ALT 19 0 - 44 IU/L  Ehrlichia antibody panel  Result Value Ref Range   E.Chaffeensis (HME) IgG Negative Neg:<1:64   E. Chaffeensis (HME) IgM Titer Negative Neg:<1:20   HGE IgG Titer Negative Neg:<1:64   HGE IgM Titer Negative Neg:<1:20  Babesia microti Antibody Panel  Result Value Ref Range   Babesia microti IgM <1:10 Neg:<1:10   Babesia microti IgG <1:10 Neg:<1:10  Rocky mtn spotted fvr abs pnl(IgG+IgM)  Result Value Ref Range   RMSF IgG Negative Negative   RMSF IgM 0.24 0.00 - 0.89 index  Lyme Ab/Western Blot Reflex  Result Value Ref Range   Lyme IgG/IgM Ab <0.91 0.00 - 0.90 ISR   LYME DISEASE AB, QUANT, IGM <0.80 0.00 - 0.79 index  ANA  Result Value Ref Range   Anit Nuclear Antibody(ANA) Negative Negative  CK  Result Value Ref Range   Total CK 33 24 - 204 U/L      Assessment & Plan:   Problem List Items Addressed This Visit    None    Visit Diagnoses    Arthralgia, unspecified joint    -  Primary   No better. Feels worse again. Will restart prednisone- to see rheumatology on Monday.    Elevated BP without diagnosis of hypertension       Stopped his lisinopril. Advised him to restart it.       Follow up plan: Return if symptoms worsen or fail to improve.

## 2017-04-21 ENCOUNTER — Encounter: Payer: Self-pay | Admitting: Family Medicine

## 2017-04-24 ENCOUNTER — Ambulatory Visit
Admission: RE | Admit: 2017-04-24 | Discharge: 2017-04-24 | Disposition: A | Discharge status: Home / Self Care | Payer: 59 | Source: Ambulatory Visit | Attending: Internal Medicine | Admitting: Internal Medicine

## 2017-04-24 ENCOUNTER — Other Ambulatory Visit
Admission: RE | Admit: 2017-04-24 | Discharge: 2017-04-24 | Disposition: A | Discharge status: Home / Self Care | Payer: 59 | Source: Ambulatory Visit | Attending: Internal Medicine | Admitting: Internal Medicine

## 2017-04-24 ENCOUNTER — Other Ambulatory Visit: Payer: Self-pay | Admitting: Internal Medicine

## 2017-04-24 DIAGNOSIS — M2550 Pain in unspecified joint: Secondary | ICD-10-CM

## 2017-04-24 DIAGNOSIS — R634 Abnormal weight loss: Secondary | ICD-10-CM

## 2017-04-24 DIAGNOSIS — R7989 Other specified abnormal findings of blood chemistry: Secondary | ICD-10-CM | POA: Insufficient documentation

## 2017-04-24 DIAGNOSIS — R5383 Other fatigue: Secondary | ICD-10-CM | POA: Insufficient documentation

## 2017-04-24 DIAGNOSIS — R5382 Chronic fatigue, unspecified: Secondary | ICD-10-CM | POA: Diagnosis not present

## 2017-04-24 DIAGNOSIS — R918 Other nonspecific abnormal finding of lung field: Secondary | ICD-10-CM | POA: Diagnosis not present

## 2017-04-24 DIAGNOSIS — R079 Chest pain, unspecified: Secondary | ICD-10-CM | POA: Diagnosis not present

## 2017-04-24 LAB — CBC WITH DIFFERENTIAL/PLATELET
BASOS PCT: 0 %
Basophils Absolute: 0.1 10*3/uL (ref 0–0.1)
EOS PCT: 0 %
Eosinophils Absolute: 0 10*3/uL (ref 0–0.7)
HEMATOCRIT: 40.1 % (ref 40.0–52.0)
Hemoglobin: 13.5 g/dL (ref 13.0–18.0)
LYMPHS PCT: 33 %
Lymphs Abs: 5.2 10*3/uL — ABNORMAL HIGH (ref 1.0–3.6)
MCH: 29 pg (ref 26.0–34.0)
MCHC: 33.8 g/dL (ref 32.0–36.0)
MCV: 85.9 fL (ref 80.0–100.0)
MONO ABS: 0.8 10*3/uL (ref 0.2–1.0)
MONOS PCT: 5 %
NEUTROS ABS: 9.8 10*3/uL — AB (ref 1.4–6.5)
Neutrophils Relative %: 62 %
Platelets: 660 10*3/uL — ABNORMAL HIGH (ref 150–440)
RBC: 4.67 MIL/uL (ref 4.40–5.90)
RDW: 13.9 % (ref 11.5–14.5)
WBC: 15.9 10*3/uL — ABNORMAL HIGH (ref 3.8–10.6)

## 2017-04-24 LAB — URINALYSIS, COMPLETE (UACMP) WITH MICROSCOPIC
BACTERIA UA: NONE SEEN
BILIRUBIN URINE: NEGATIVE
Glucose, UA: >=500 mg/dL — AB
HGB URINE DIPSTICK: NEGATIVE
KETONES UR: NEGATIVE mg/dL
LEUKOCYTES UA: NEGATIVE
Nitrite: NEGATIVE
PROTEIN: NEGATIVE mg/dL
Specific Gravity, Urine: 1.033 — ABNORMAL HIGH (ref 1.005–1.030)
Squamous Epithelial / LPF: NONE SEEN
pH: 5 (ref 5.0–8.0)

## 2017-04-24 LAB — CALCIUM: Calcium: 9.5 mg/dL (ref 8.9–10.3)

## 2017-04-24 LAB — C-REACTIVE PROTEIN: CRP: 3.5 mg/dL — ABNORMAL HIGH (ref <1.0)

## 2017-04-24 LAB — TSH: TSH: 0.346 u[IU]/mL — ABNORMAL LOW (ref 0.350–4.500)

## 2017-04-24 LAB — SEDIMENTATION RATE: Sed Rate: 60 mm/hr — ABNORMAL HIGH (ref 0–15)

## 2017-04-25 ENCOUNTER — Other Ambulatory Visit
Admission: RE | Admit: 2017-04-25 | Discharge: 2017-04-25 | Disposition: A | Discharge status: Home / Self Care | Payer: 59 | Source: Ambulatory Visit | Attending: Internal Medicine | Admitting: Internal Medicine

## 2017-04-25 DIAGNOSIS — M2550 Pain in unspecified joint: Secondary | ICD-10-CM | POA: Insufficient documentation

## 2017-04-25 LAB — PROTEIN ELECTROPHORESIS, SERUM
A/G Ratio: 0.9 (ref 0.7–1.7)
ALBUMIN ELP: 3.4 g/dL (ref 2.9–4.4)
ALPHA-1-GLOBULIN: 0.3 g/dL (ref 0.0–0.4)
Alpha-2-Globulin: 1.2 g/dL — ABNORMAL HIGH (ref 0.4–1.0)
Beta Globulin: 1.4 g/dL — ABNORMAL HIGH (ref 0.7–1.3)
GLOBULIN, TOTAL: 3.8 g/dL (ref 2.2–3.9)
Gamma Globulin: 0.9 g/dL (ref 0.4–1.8)
TOTAL PROTEIN ELP: 7.2 g/dL (ref 6.0–8.5)

## 2017-04-25 LAB — VITAMIN D 25 HYDROXY (VIT D DEFICIENCY, FRACTURES): VIT D 25 HYDROXY: 15.1 ng/mL — AB (ref 30.0–100.0)

## 2017-04-25 LAB — ANTINUCLEAR ANTIBODIES, IFA: ANA Ab, IFA: NEGATIVE

## 2017-04-25 LAB — T4: T4 TOTAL: 9.7 ug/dL (ref 4.5–12.0)

## 2017-04-25 LAB — IMMUNOFIXATION, URINE

## 2017-04-26 ENCOUNTER — Telehealth (INDEPENDENT_AMBULATORY_CARE_PROVIDER_SITE_OTHER): Payer: Self-pay | Admitting: Orthopaedic Surgery

## 2017-04-26 ENCOUNTER — Other Ambulatory Visit (INDEPENDENT_AMBULATORY_CARE_PROVIDER_SITE_OTHER): Payer: Self-pay

## 2017-04-26 LAB — PROTEIN ELECTRO, RANDOM URINE
Albumin ELP, Urine: 28.3 %
Alpha-1-Globulin, U: 6.9 %
Alpha-2-Globulin, U: 18.1 %
BETA GLOBULIN, U: 29 %
Gamma Globulin, U: 17.6 %
TOTAL PROTEIN, URINE-UPE24: 13.6 mg/dL

## 2017-04-26 LAB — IMMUNOFIXATION ELECTROPHORESIS
IGG (IMMUNOGLOBIN G), SERUM: 862 mg/dL (ref 700–1600)
IGM (IMMUNOGLOBULIN M), SRM: 173 mg/dL — AB (ref 20–172)
IgA: 300 mg/dL (ref 90–386)
Total Protein ELP: 7.6 g/dL (ref 6.0–8.5)

## 2017-04-26 MED ORDER — TRAMADOL HCL 50 MG PO TABS: ORAL_TABLET | ORAL | 0 refills | Status: DC

## 2017-04-26 NOTE — Telephone Encounter (Signed)
Patients wife called requesting RX refill of tramadol sent to [facility]. Give patient a call once done # [phone]

## 2017-04-26 NOTE — Telephone Encounter (Signed)
#  [phone].  He will need to get future refills from PCP or pain clinic

## 2017-04-26 NOTE — Telephone Encounter (Signed)
Called patient no answer LMOM to return our call.  Rx called into pharm.  He will need to get future refills from PCP or pain clinic.

## 2017-04-26 NOTE — Telephone Encounter (Signed)
Please advise 

## 2017-05-05 ENCOUNTER — Other Ambulatory Visit: Payer: Self-pay | Admitting: Internal Medicine

## 2017-05-05 ENCOUNTER — Ambulatory Visit
Admission: RE | Admit: 2017-05-05 | Discharge: 2017-05-05 | Disposition: A | Discharge status: Home / Self Care | Payer: 59 | Source: Ambulatory Visit | Attending: Internal Medicine | Admitting: Internal Medicine

## 2017-05-05 ENCOUNTER — Other Ambulatory Visit
Admission: RE | Admit: 2017-05-05 | Discharge: 2017-05-05 | Disposition: A | Discharge status: Home / Self Care | Payer: 59 | Source: Ambulatory Visit | Attending: Internal Medicine | Admitting: Internal Medicine

## 2017-05-05 DIAGNOSIS — Z0189 Encounter for other specified special examinations: Secondary | ICD-10-CM | POA: Diagnosis not present

## 2017-05-05 DIAGNOSIS — M2550 Pain in unspecified joint: Secondary | ICD-10-CM | POA: Diagnosis not present

## 2017-05-05 DIAGNOSIS — D473 Essential (hemorrhagic) thrombocythemia: Secondary | ICD-10-CM | POA: Insufficient documentation

## 2017-05-05 DIAGNOSIS — D75839 Thrombocytosis, unspecified: Secondary | ICD-10-CM | POA: Insufficient documentation

## 2017-05-05 DIAGNOSIS — E559 Vitamin D deficiency, unspecified: Secondary | ICD-10-CM | POA: Insufficient documentation

## 2017-05-05 DIAGNOSIS — R5382 Chronic fatigue, unspecified: Secondary | ICD-10-CM | POA: Diagnosis not present

## 2017-05-05 DIAGNOSIS — R634 Abnormal weight loss: Secondary | ICD-10-CM | POA: Diagnosis not present

## 2017-05-05 DIAGNOSIS — M79641 Pain in right hand: Secondary | ICD-10-CM | POA: Diagnosis not present

## 2017-05-05 DIAGNOSIS — M19042 Primary osteoarthritis, left hand: Secondary | ICD-10-CM | POA: Diagnosis not present

## 2017-05-05 DIAGNOSIS — M1990 Unspecified osteoarthritis, unspecified site: Secondary | ICD-10-CM | POA: Diagnosis not present

## 2017-05-05 LAB — C-REACTIVE PROTEIN: CRP: 8.8 mg/dL — AB (ref <1.0)

## 2017-05-05 LAB — SEDIMENTATION RATE: SED RATE: 75 mm/h — AB (ref 0–15)

## 2017-05-09 ENCOUNTER — Telehealth: Payer: Self-pay

## 2017-05-09 MED ORDER — SEMAGLUTIDE(0.25 OR 0.5MG/DOS) 2 MG/1.5ML ~~LOC~~ SOPN: 0.5000 mg | PEN_INJECTOR | SUBCUTANEOUS | 3 refills | Status: DC

## 2017-05-09 NOTE — Telephone Encounter (Signed)
Dr.Johnson, can you please send a prescription to his pharmacy, I have the savings card, and I will notify the wife.   Copied from [place] [phone]. Topic: Inquiry >> May 09, 2017  8:08 AM Pricilla Handler wrote: Reason for CRM: Patient's wife called requesting a message be sent to Dr. Wynetta Emery. Patient's wife states that Dr. Wynetta Emery had given the patient a sample of Ozempic that has been used. She stated that she contacted their pharmacy thinking that they had a refill, which they did not. Patient's wife is requesting a refill of Ozempic or a new sample asap. Patient's wife also states that the pharmacy states that the medication will require a prior authorization and a discount card, as it is expensive. Patient's wife would like a call back from Dr. Wynetta Emery or her assistant at [phone].       Thank You!!!

## 2017-05-24 ENCOUNTER — Ambulatory Visit (INDEPENDENT_AMBULATORY_CARE_PROVIDER_SITE_OTHER): Payer: 59 | Admitting: Family Medicine

## 2017-05-24 ENCOUNTER — Encounter: Payer: Self-pay | Admitting: Family Medicine

## 2017-05-24 VITALS — BP 129/85 | HR 86 | Temp 98.5°F | Wt 239.0 lb

## 2017-05-24 DIAGNOSIS — E1165 Type 2 diabetes mellitus with hyperglycemia: Secondary | ICD-10-CM | POA: Diagnosis not present

## 2017-05-24 DIAGNOSIS — R7989 Other specified abnormal findings of blood chemistry: Secondary | ICD-10-CM

## 2017-05-24 DIAGNOSIS — R29898 Other symptoms and signs involving the musculoskeletal system: Secondary | ICD-10-CM

## 2017-05-24 DIAGNOSIS — I10 Essential (primary) hypertension: Secondary | ICD-10-CM | POA: Diagnosis not present

## 2017-05-24 DIAGNOSIS — Z79899 Other long term (current) drug therapy: Secondary | ICD-10-CM

## 2017-05-24 LAB — BAYER DCA HB A1C WAIVED: HB A1C: 9.4 % — AB (ref <7.0)

## 2017-05-24 MED ORDER — CYCLOBENZAPRINE HCL 10 MG PO TABS: 10.0000 mg | ORAL_TABLET | Freq: Every day | ORAL | 2 refills | Status: DC

## 2017-05-24 NOTE — Progress Notes (Signed)
BP 129/85 (BP Location: Left Arm, Patient Position: Sitting, Cuff Size: Large)   Pulse 86   Temp 98.5 F (36.9 C)   Wt 239 lb (108.4 kg)   SpO2 96%   BMI 31.68 kg/m    Subjective:    Patient ID: Tony Cardenas, male    DOB: Apr 14, 1970, 47 y.o.   MRN: [id number]  HPI: Tony Cardenas is a 47 y.o. male  Chief Complaint  Patient presents with  . Diabetes   SHOULDER PAIN Duration: 2-3 months Involved shoulder: right Mechanism of injury: unknown Location: diffuse Onset:sudden Severity: severe  Quality:  Aching and sore Frequency: constant Radiation: yes, R arm Aggravating factors: lifting and movement  Alleviating factors: nothing  Status: worse Treatments attempted: tramadol, rest, ice, heat, sling, APAP, ibuprofen and aleve  Relief with NSAIDs?:  No NSAIDs Taken Weakness: yes Numbness: yes Decreased grip strength: yes Redness: no Swelling: no Bruising: no Fevers: no  HYPERTENSION Hypertension status: controlled  Satisfied with current treatment? yes Duration of hypertension: chronic BP monitoring frequency:  not checking BP range:  BP medication side effects:  no Medication compliance: excellent compliance Previous BP meds: lisinopril Aspirin: yes Recurrent headaches: no Visual changes: no Palpitations: no Dyspnea: no Chest pain: no Lower extremity edema: no Dizzy/lightheaded: no  DIABETES- came off his shot for about 2 weeks about 4-5 weeks ago. Doing well with the shot Hypoglycemic episodes:no Polydipsia/polyuria: no Visual disturbance: no Chest pain: no Paresthesias: yes Glucose Monitoring: yes- 210-220 in the AM  Taking Insulin?: no Blood Pressure Monitoring: not checking Retinal Examination: Not up to Date Foot Exam: Up to Date Diabetic Education: Completed Pneumovax: Up to Date Influenza: Up to Date Aspirin: yes   Relevant past medical, surgical, family and social history reviewed and updated as indicated. Interim medical  history since our last visit reviewed. Allergies and medications reviewed and updated.  Review of Systems  Constitutional: Negative.   Respiratory: Negative.   Cardiovascular: Negative.   Musculoskeletal: Positive for myalgias, neck pain and neck stiffness. Negative for arthralgias, back pain, gait problem and joint swelling.  Skin: Negative.   Neurological: Positive for weakness. Negative for dizziness, tremors, seizures, syncope, facial asymmetry, speech difficulty, light-headedness, numbness and headaches.  Psychiatric/Behavioral: Negative.     Per HPI unless specifically indicated above     Objective:    BP 129/85 (BP Location: Left Arm, Patient Position: Sitting, Cuff Size: Large)   Pulse 86   Temp 98.5 F (36.9 C)   Wt 239 lb (108.4 kg)   SpO2 96%   BMI 31.68 kg/m   Wt Readings from Last 3 Encounters:  05/24/17 239 lb (108.4 kg)  04/20/17 232 lb 8 oz (105.5 kg)  04/07/17 234 lb 9 oz (106.4 kg)    Physical Exam  Constitutional: He is oriented to person, place, and time. He appears well-developed and well-nourished. No distress.  HENT:  Head: Normocephalic and atraumatic.  Right Ear: Hearing normal.  Left Ear: Hearing normal.  Nose: Nose normal.  Eyes: Conjunctivae and lids are normal. Right eye exhibits no discharge. Left eye exhibits no discharge. No scleral icterus.  Cardiovascular: Normal rate, regular rhythm, normal heart sounds and intact distal pulses. Exam reveals no gallop and no friction rub.  No murmur heard. Pulmonary/Chest: Effort normal and breath sounds normal. No stridor. No respiratory distress. He has no wheezes. He has no rales. He exhibits no tenderness.  Neurological: He is alert and oriented to person, place, and time.  Skin: Skin is warm,  dry and intact. Capillary refill takes less than 2 seconds. No rash noted. He is not diaphoretic. No erythema. No pallor.  Psychiatric: He has a normal mood and affect. His speech is normal and behavior is  normal. Judgment and thought content normal. Cognition and memory are normal.    Results for orders placed or performed in visit on 05/24/17  Bayer DCA Hb A1c Waived  Result Value Ref Range   Bayer DCA Hb A1c Waived 9.4 (H) <5.7 %  Basic metabolic panel  Result Value Ref Range   Glucose 188 (H) 65 - 99 mg/dL   BUN 9 6 - 24 mg/dL   Creatinine, Ser 0.57 (L) 0.76 - 1.27 mg/dL   GFR calc non Af Amer 121 >59 mL/min/1.73   GFR calc Af Amer 140 >59 mL/min/1.73   BUN/Creatinine Ratio 16 9 - 20   Sodium 138 134 - 144 mmol/L   Potassium 4.3 3.5 - 5.2 mmol/L   Chloride 101 96 - 106 mmol/L   CO2 17 (L) 20 - 29 mmol/L   Calcium 9.1 8.7 - 10.2 mg/dL      Assessment & Plan:   Problem List Items Addressed This Visit      Cardiovascular and Mediastinum   HTN (hypertension)    Doing well on current regimen. Continue to monitor. Call with any concerns. Continue to monitor.       Relevant Orders   Basic metabolic panel (Completed)     Endocrine   Type 2 diabetes mellitus with hyperglycemia ([place]) - Primary    Doing well on current regimen with A1c of 9.4- better, but still not under good control. Continue to monitor. Call with any concerns. Continue to monitor.       Relevant Orders   Bayer DCA Hb A1c Waived (Completed)   Ambulatory referral to Ophthalmology    Other Visit Diagnoses    Low TSH level       Will obtain thyroid US and NM uptake scan. Await results.    Relevant Orders   US THYROID   NM THYROID MULT UPTAKE W/IMAGING   Right arm weakness       Will obtain cervical x-ray. Await results. Has history of arthritis in his neck.   Relevant Orders   DG Cervical Spine Complete   Long-term use of high-risk medication       Checking labs today. Await results.    Relevant Orders   Ambulatory referral to Ophthalmology       Follow up plan: Return in about 3 months (around 08/24/2017) for Follow up DM.

## 2017-05-24 NOTE — Patient Instructions (Signed)
Take 0.5 of your ozempic for 1 month, then increase to 1.0mg  after that.

## 2017-05-25 LAB — BASIC METABOLIC PANEL
BUN / CREAT RATIO: 16 (ref 9–20)
BUN: 9 mg/dL (ref 6–24)
CO2: 17 mmol/L — ABNORMAL LOW (ref 20–29)
CREATININE: 0.57 mg/dL — AB (ref 0.76–1.27)
Calcium: 9.1 mg/dL (ref 8.7–10.2)
Chloride: 101 mmol/L (ref 96–106)
GFR calc Af Amer: 140 mL/min/{1.73_m2} (ref >59)
GFR, EST NON AFRICAN AMERICAN: 121 mL/min/{1.73_m2} (ref >59)
GLUCOSE: 188 mg/dL — AB (ref 65–99)
Potassium: 4.3 mmol/L (ref 3.5–5.2)
SODIUM: 138 mmol/L (ref 134–144)

## 2017-05-27 ENCOUNTER — Encounter: Payer: Self-pay | Admitting: Family Medicine

## 2017-05-27 NOTE — Assessment & Plan Note (Signed)
Doing well on current regimen with A1c of 9.4- better, but still not under good control. Continue to monitor. Call with any concerns. Continue to monitor.

## 2017-05-27 NOTE — Assessment & Plan Note (Signed)
Doing well on current regimen. Continue to monitor. Call with any concerns. Continue to monitor.

## 2017-05-30 ENCOUNTER — Ambulatory Visit
Admission: RE | Admit: 2017-05-30 | Discharge: 2017-05-30 | Disposition: A | Discharge status: Home / Self Care | Payer: 59 | Source: Ambulatory Visit | Attending: Family Medicine | Admitting: Family Medicine

## 2017-05-30 ENCOUNTER — Other Ambulatory Visit: Payer: Self-pay | Admitting: Family Medicine

## 2017-05-30 DIAGNOSIS — R29898 Other symptoms and signs involving the musculoskeletal system: Secondary | ICD-10-CM | POA: Insufficient documentation

## 2017-05-30 DIAGNOSIS — Z9889 Other specified postprocedural states: Secondary | ICD-10-CM | POA: Insufficient documentation

## 2017-05-30 DIAGNOSIS — M5030 Other cervical disc degeneration, unspecified cervical region: Secondary | ICD-10-CM

## 2017-05-30 DIAGNOSIS — M1288 Other specific arthropathies, not elsewhere classified, other specified site: Secondary | ICD-10-CM | POA: Insufficient documentation

## 2017-05-30 DIAGNOSIS — M47812 Spondylosis without myelopathy or radiculopathy, cervical region: Secondary | ICD-10-CM | POA: Diagnosis not present

## 2017-05-30 NOTE — Progress Notes (Signed)
MR cervical

## 2017-06-01 ENCOUNTER — Other Ambulatory Visit
Admission: RE | Admit: 2017-06-01 | Discharge: 2017-06-01 | Disposition: A | Discharge status: Home / Self Care | Payer: 59 | Source: Ambulatory Visit | Attending: Internal Medicine | Admitting: Internal Medicine

## 2017-06-01 DIAGNOSIS — M1990 Unspecified osteoarthritis, unspecified site: Secondary | ICD-10-CM | POA: Insufficient documentation

## 2017-06-01 DIAGNOSIS — Z79899 Other long term (current) drug therapy: Secondary | ICD-10-CM | POA: Insufficient documentation

## 2017-06-01 DIAGNOSIS — R700 Elevated erythrocyte sedimentation rate: Secondary | ICD-10-CM | POA: Diagnosis not present

## 2017-06-01 LAB — CBC WITH DIFFERENTIAL/PLATELET
BASOS ABS: 0.1 10*3/uL (ref 0–0.1)
BASOS PCT: 1 %
Eosinophils Absolute: 0.2 10*3/uL (ref 0–0.7)
Eosinophils Relative: 2 %
HEMATOCRIT: 40.7 % (ref 40.0–52.0)
Hemoglobin: 14.1 g/dL (ref 13.0–18.0)
LYMPHS PCT: 28 %
Lymphs Abs: 3.6 10*3/uL (ref 1.0–3.6)
MCH: 29.7 pg (ref 26.0–34.0)
MCHC: 34.6 g/dL (ref 32.0–36.0)
MCV: 85.8 fL (ref 80.0–100.0)
MONO ABS: 0.7 10*3/uL (ref 0.2–1.0)
MONOS PCT: 6 %
Neutro Abs: 8.1 10*3/uL — ABNORMAL HIGH (ref 1.4–6.5)
Neutrophils Relative %: 63 %
PLATELETS: 461 10*3/uL — AB (ref 150–440)
RBC: 4.75 MIL/uL (ref 4.40–5.90)
RDW: 15.2 % — AB (ref 11.5–14.5)
WBC: 12.7 10*3/uL — ABNORMAL HIGH (ref 3.8–10.6)

## 2017-06-01 LAB — CREATININE, SERUM
CREATININE: 0.42 mg/dL — AB (ref 0.61–1.24)
GFR calc Af Amer: >60 mL/min (ref >60)

## 2017-06-01 LAB — HEPATIC FUNCTION PANEL
ALT: 19 U/L (ref 17–63)
AST: 26 U/L (ref 15–41)
Albumin: 4.1 g/dL (ref 3.5–5.0)
Alkaline Phosphatase: 59 U/L (ref 38–126)
BILIRUBIN TOTAL: 0.5 mg/dL (ref 0.3–1.2)
Total Protein: 7.6 g/dL (ref 6.5–8.1)

## 2017-06-01 LAB — SEDIMENTATION RATE: SED RATE: 30 mm/h — AB (ref 0–15)

## 2017-06-01 LAB — CKMB (ARMC ONLY): CK, MB: 2.2 ng/mL (ref 0.5–5.0)

## 2017-06-01 LAB — LACTATE DEHYDROGENASE: LDH: 98 U/L (ref 98–192)

## 2017-06-01 LAB — C-REACTIVE PROTEIN: CRP: 2.8 mg/dL — AB (ref <1.0)

## 2017-06-14 ENCOUNTER — Encounter: Payer: Self-pay | Admitting: Family Medicine

## 2017-06-16 ENCOUNTER — Ambulatory Visit
Admission: RE | Admit: 2017-06-16 | Discharge: 2017-06-16 | Disposition: A | Discharge status: Home / Self Care | Payer: 59 | Source: Ambulatory Visit | Attending: Family Medicine | Admitting: Family Medicine

## 2017-06-16 DIAGNOSIS — R7989 Other specified abnormal findings of blood chemistry: Secondary | ICD-10-CM | POA: Diagnosis not present

## 2017-06-16 DIAGNOSIS — Z981 Arthrodesis status: Secondary | ICD-10-CM | POA: Insufficient documentation

## 2017-06-16 DIAGNOSIS — M47812 Spondylosis without myelopathy or radiculopathy, cervical region: Secondary | ICD-10-CM | POA: Insufficient documentation

## 2017-06-16 DIAGNOSIS — M4802 Spinal stenosis, cervical region: Secondary | ICD-10-CM | POA: Diagnosis not present

## 2017-06-16 DIAGNOSIS — M50223 Other cervical disc displacement at C6-C7 level: Secondary | ICD-10-CM | POA: Insufficient documentation

## 2017-06-16 DIAGNOSIS — E0590 Thyrotoxicosis, unspecified without thyrotoxic crisis or storm: Secondary | ICD-10-CM | POA: Diagnosis not present

## 2017-06-16 DIAGNOSIS — M5030 Other cervical disc degeneration, unspecified cervical region: Secondary | ICD-10-CM

## 2017-06-16 DIAGNOSIS — M542 Cervicalgia: Secondary | ICD-10-CM | POA: Diagnosis not present

## 2017-06-23 ENCOUNTER — Telehealth: Payer: Self-pay | Admitting: Family Medicine

## 2017-06-23 DIAGNOSIS — M4802 Spinal stenosis, cervical region: Secondary | ICD-10-CM

## 2017-06-23 NOTE — Telephone Encounter (Signed)
Called to go over MRI results with patient. Will refer to neurosurgery. Referral generated. Does not have a preference on who he sees.

## 2017-06-23 NOTE — Telephone Encounter (Signed)
See other TE.

## 2017-06-29 ENCOUNTER — Other Ambulatory Visit
Admission: RE | Admit: 2017-06-29 | Discharge: 2017-06-29 | Disposition: A | Discharge status: Home / Self Care | Payer: 59 | Source: Ambulatory Visit | Attending: Internal Medicine | Admitting: Internal Medicine

## 2017-06-29 DIAGNOSIS — M2550 Pain in unspecified joint: Secondary | ICD-10-CM | POA: Diagnosis not present

## 2017-06-29 LAB — SEDIMENTATION RATE: SED RATE: 20 mm/h — AB (ref 0–15)

## 2017-06-30 LAB — C-REACTIVE PROTEIN: CRP: 2.3 mg/dL — AB (ref <1.0)

## 2017-08-11 DIAGNOSIS — M1990 Unspecified osteoarthritis, unspecified site: Secondary | ICD-10-CM | POA: Diagnosis not present

## 2017-08-11 DIAGNOSIS — Z79899 Other long term (current) drug therapy: Secondary | ICD-10-CM | POA: Diagnosis not present

## 2017-08-11 DIAGNOSIS — D473 Essential (hemorrhagic) thrombocythemia: Secondary | ICD-10-CM | POA: Diagnosis not present

## 2017-08-21 ENCOUNTER — Other Ambulatory Visit: Payer: Self-pay | Admitting: Family Medicine

## 2017-08-24 ENCOUNTER — Ambulatory Visit: Payer: 59 | Admitting: Family Medicine

## 2017-09-04 ENCOUNTER — Ambulatory Visit: Payer: 59 | Admitting: Family Medicine

## 2017-09-13 ENCOUNTER — Other Ambulatory Visit: Payer: Self-pay

## 2017-09-13 ENCOUNTER — Encounter: Payer: Self-pay | Admitting: Family Medicine

## 2017-09-13 ENCOUNTER — Ambulatory Visit (INDEPENDENT_AMBULATORY_CARE_PROVIDER_SITE_OTHER): Payer: 59 | Admitting: Family Medicine

## 2017-09-13 VITALS — BP 135/84 | HR 81 | Temp 98.1°F | Wt 249.0 lb

## 2017-09-13 DIAGNOSIS — E782 Mixed hyperlipidemia: Secondary | ICD-10-CM | POA: Diagnosis not present

## 2017-09-13 DIAGNOSIS — E1165 Type 2 diabetes mellitus with hyperglycemia: Secondary | ICD-10-CM

## 2017-09-13 DIAGNOSIS — I10 Essential (primary) hypertension: Secondary | ICD-10-CM | POA: Diagnosis not present

## 2017-09-13 DIAGNOSIS — Z23 Encounter for immunization: Secondary | ICD-10-CM

## 2017-09-13 LAB — BAYER DCA HB A1C WAIVED: HB A1C (BAYER DCA - WAIVED): 7.2 % — ABNORMAL HIGH (ref <7.0)

## 2017-09-13 MED ORDER — LISINOPRIL 5 MG PO TABS: 5.0000 mg | ORAL_TABLET | Freq: Every day | ORAL | 1 refills | Status: DC

## 2017-09-13 MED ORDER — METFORMIN HCL ER 500 MG PO TB24: 1000.0000 mg | ORAL_TABLET | Freq: Two times a day (BID) | ORAL | 1 refills | Status: DC

## 2017-09-13 NOTE — Assessment & Plan Note (Signed)
Under good control on current regimen. Continue current regimen. Continue to monitor. Call with any concerns. Refills given.   

## 2017-09-13 NOTE — Progress Notes (Signed)
BP 135/84   Pulse 81   Temp 98.1 F (36.7 C) (Oral)   Wt 249 lb (112.9 kg)   SpO2 96%   BMI 33.00 kg/m    Subjective:    Patient ID: Tony Cardenas, male    DOB: 30-Aug-1970, 47 y.o.   MRN: [id number]  HPI: Tony Cardenas is a 47 y.o. male  Chief Complaint  Patient presents with  . Diabetes  . Hypertension  . Hyperlipidemia   HYPERTENSION / HYPERLIPIDEMIA Satisfied with current treatment? yes Duration of hypertension: chronic BP monitoring frequency: not checking BP medication side effects: no Past BP meds: lisinopril Duration of hyperlipidemia: chronic Cholesterol medication side effects: yes Cholesterol supplements: none Past cholesterol medications: statin intolerant Medication compliance: excellent compliance Aspirin: no Recent stressors: no Recurrent headaches: no Visual changes: no Palpitations: no Dyspnea: no Chest pain: no Lower extremity edema: no Dizzy/lightheaded: no  DIABETES Hypoglycemic episodes:yes Polydipsia/polyuria: no Visual disturbance: no Chest pain: no Paresthesias: no Glucose Monitoring: yes Taking Insulin?: no Blood Pressure Monitoring: not checking Retinal Examination: Not up to Date Foot Exam: Up to Date Diabetic Education: Completed Pneumovax: Up to Date Influenza: Up to Date Aspirin: yes  Relevant past medical, surgical, family and social history reviewed and updated as indicated. Interim medical history since our last visit reviewed. Allergies and medications reviewed and updated.  Review of Systems  Constitutional: Negative.   Respiratory: Negative.   Cardiovascular: Negative.   Gastrointestinal: Negative.   Psychiatric/Behavioral: Negative.     Per HPI unless specifically indicated above     Objective:    BP 135/84   Pulse 81   Temp 98.1 F (36.7 C) (Oral)   Wt 249 lb (112.9 kg)   SpO2 96%   BMI 33.00 kg/m   Wt Readings from Last 3 Encounters:  09/13/17 249 lb (112.9 kg)  05/24/17 239 lb  (108.4 kg)  04/20/17 232 lb 8 oz (105.5 kg)    Physical Exam  Constitutional: He is oriented to person, place, and time. He appears well-developed and well-nourished. No distress.  HENT:  Head: Normocephalic and atraumatic.  Right Ear: Hearing normal.  Left Ear: Hearing normal.  Nose: Nose normal.  Eyes: Conjunctivae and lids are normal. Right eye exhibits no discharge. Left eye exhibits no discharge. No scleral icterus.  Cardiovascular: Normal rate, regular rhythm, normal heart sounds and intact distal pulses. Exam reveals no gallop and no friction rub.  No murmur heard. Pulmonary/Chest: Effort normal and breath sounds normal. No stridor. No respiratory distress. He has no wheezes. He has no rales. He exhibits no tenderness.  Musculoskeletal: Normal range of motion.  Neurological: He is alert and oriented to person, place, and time.  Skin: Skin is warm, dry and intact. Capillary refill takes less than 2 seconds. No rash noted. He is not diaphoretic. No erythema. No pallor.  Psychiatric: He has a normal mood and affect. His speech is normal and behavior is normal. Judgment and thought content normal. Cognition and memory are normal.  Nursing note and vitals reviewed.   Results for orders placed or performed during the hospital encounter of 06/29/17  C-reactive protein  Result Value Ref Range   CRP 2.3 (H) <1.0 mg/dL  Sedimentation rate  Result Value Ref Range   Sed Rate 20 (H) 0 - 15 mm/hr      Assessment & Plan:   Problem List Items Addressed This Visit      Cardiovascular and Mediastinum   HTN (hypertension) - Primary  Under good control on current regimen. Continue current regimen. Continue to monitor. Call with any concerns. Refills given.        Relevant Medications   lisinopril (PRINIVIL,ZESTRIL) 5 MG tablet   Other Relevant Orders   Comprehensive metabolic panel     Endocrine   Type 2 diabetes mellitus with hyperglycemia (HCC)    Under better control with a1c  of 7.2. Will increase to 100mg  BID while he's on the prednisone and recheck 3 months. Call with any concerns.       Relevant Medications   lisinopril (PRINIVIL,ZESTRIL) 5 MG tablet   metFORMIN (GLUCOPHAGE-XR) 500 MG 24 hr tablet   Other Relevant Orders   Bayer DCA Hb A1c Waived   Comprehensive metabolic panel     Other   Hyperlipidemia    Under good control on current regimen. Continue current regimen. Continue to monitor. Call with any concerns. Refills given.        Relevant Medications   lisinopril (PRINIVIL,ZESTRIL) 5 MG tablet   Other Relevant Orders   Lipid Panel w/o Chol/HDL Ratio   Comprehensive metabolic panel    Other Visit Diagnoses    Flu vaccine need       Flu shot given today   Relevant Orders   Flu Vaccine QUAD 36+ mos IM (Completed)       Follow up plan: Return in about 3 months (around 12/13/2017) for Follow up DM.

## 2017-09-13 NOTE — Assessment & Plan Note (Signed)
Under better control with a1c of 7.2. Will increase to 100mg  BID while he's on the prednisone and recheck 3 months. Call with any concerns.

## 2017-09-14 LAB — COMPREHENSIVE METABOLIC PANEL
A/G RATIO: 1.6 (ref 1.2–2.2)
ALBUMIN: 4.3 g/dL (ref 3.5–5.5)
ALT: 24 IU/L (ref 0–44)
AST: 18 IU/L (ref 0–40)
Alkaline Phosphatase: 69 IU/L (ref 39–117)
BILIRUBIN TOTAL: 0.2 mg/dL (ref 0.0–1.2)
BUN/Creatinine Ratio: 10 (ref 9–20)
BUN: 7 mg/dL (ref 6–24)
CALCIUM: 9.2 mg/dL (ref 8.7–10.2)
CHLORIDE: 103 mmol/L (ref 96–106)
CO2: 21 mmol/L (ref 20–29)
Creatinine, Ser: 0.68 mg/dL — ABNORMAL LOW (ref 0.76–1.27)
GFR calc Af Amer: 131 mL/min/{1.73_m2} (ref >59)
GFR calc non Af Amer: 113 mL/min/{1.73_m2} (ref >59)
GLOBULIN, TOTAL: 2.7 g/dL (ref 1.5–4.5)
Glucose: 171 mg/dL — ABNORMAL HIGH (ref 65–99)
Potassium: 4.1 mmol/L (ref 3.5–5.2)
Sodium: 141 mmol/L (ref 134–144)
TOTAL PROTEIN: 7 g/dL (ref 6.0–8.5)

## 2017-09-14 LAB — LIPID PANEL W/O CHOL/HDL RATIO
CHOLESTEROL TOTAL: 200 mg/dL — AB (ref 100–199)
HDL: 29 mg/dL — ABNORMAL LOW (ref >39)
TRIGLYCERIDES: 577 mg/dL — AB (ref 0–149)

## 2017-11-15 ENCOUNTER — Other Ambulatory Visit
Admission: RE | Admit: 2017-11-15 | Discharge: 2017-11-15 | Disposition: A | Discharge status: Home / Self Care | Payer: 59 | Source: Ambulatory Visit | Attending: Internal Medicine | Admitting: Internal Medicine

## 2017-11-15 DIAGNOSIS — Z79899 Other long term (current) drug therapy: Secondary | ICD-10-CM | POA: Diagnosis not present

## 2017-11-15 DIAGNOSIS — M1990 Unspecified osteoarthritis, unspecified site: Secondary | ICD-10-CM | POA: Diagnosis not present

## 2017-11-15 DIAGNOSIS — D473 Essential (hemorrhagic) thrombocythemia: Secondary | ICD-10-CM | POA: Diagnosis not present

## 2017-11-15 LAB — CBC WITH DIFFERENTIAL/PLATELET
ABS IMMATURE GRANULOCYTES: 0.16 10*3/uL — AB (ref 0.00–0.07)
BASOS ABS: 0.1 10*3/uL (ref 0.0–0.1)
Basophils Relative: 1 %
Eosinophils Absolute: 0.3 10*3/uL (ref 0.0–0.5)
Eosinophils Relative: 2 %
HCT: 44.5 % (ref 39.0–52.0)
HEMOGLOBIN: 15.6 g/dL (ref 13.0–17.0)
IMMATURE GRANULOCYTES: 1 %
LYMPHS PCT: 24 %
Lymphs Abs: 3.4 10*3/uL (ref 0.7–4.0)
MCH: 32.4 pg (ref 26.0–34.0)
MCHC: 35.1 g/dL (ref 30.0–36.0)
MCV: 92.3 fL (ref 80.0–100.0)
Monocytes Absolute: 0.7 10*3/uL (ref 0.1–1.0)
Monocytes Relative: 5 %
NEUTROS ABS: 9.6 10*3/uL — AB (ref 1.7–7.7)
NRBC: 0 % (ref 0.0–0.2)
Neutrophils Relative %: 67 %
Platelets: 448 10*3/uL — ABNORMAL HIGH (ref 150–400)
RBC: 4.82 MIL/uL (ref 4.22–5.81)
RDW: 13.2 % (ref 11.5–15.5)
WBC: 14.3 10*3/uL — AB (ref 4.0–10.5)

## 2017-11-15 LAB — HEPATIC FUNCTION PANEL
ALBUMIN: 4.5 g/dL (ref 3.5–5.0)
ALK PHOS: 64 U/L (ref 38–126)
ALT: 21 U/L (ref 0–44)
AST: 27 U/L (ref 15–41)
Bilirubin, Direct: <0.1 mg/dL (ref 0.0–0.2)
TOTAL PROTEIN: 7.7 g/dL (ref 6.5–8.1)
Total Bilirubin: 0.6 mg/dL (ref 0.3–1.2)

## 2017-11-15 LAB — SEDIMENTATION RATE: SED RATE: 8 mm/h (ref 0–15)

## 2017-12-14 ENCOUNTER — Encounter: Payer: 59 | Admitting: Family Medicine

## 2018-01-16 ENCOUNTER — Ambulatory Visit (INDEPENDENT_AMBULATORY_CARE_PROVIDER_SITE_OTHER): Payer: 59 | Admitting: Family Medicine

## 2018-01-16 ENCOUNTER — Encounter: Payer: Self-pay | Admitting: Family Medicine

## 2018-01-16 VITALS — BP 134/80 | HR 87 | Temp 97.8°F | Ht 71.75 in | Wt 252.0 lb

## 2018-01-16 DIAGNOSIS — Z125 Encounter for screening for malignant neoplasm of prostate: Secondary | ICD-10-CM

## 2018-01-16 DIAGNOSIS — R7989 Other specified abnormal findings of blood chemistry: Secondary | ICD-10-CM | POA: Diagnosis not present

## 2018-01-16 DIAGNOSIS — D473 Essential (hemorrhagic) thrombocythemia: Secondary | ICD-10-CM | POA: Diagnosis not present

## 2018-01-16 DIAGNOSIS — E1165 Type 2 diabetes mellitus with hyperglycemia: Secondary | ICD-10-CM

## 2018-01-16 DIAGNOSIS — E782 Mixed hyperlipidemia: Secondary | ICD-10-CM | POA: Diagnosis not present

## 2018-01-16 DIAGNOSIS — Z0000 Encounter for general adult medical examination without abnormal findings: Secondary | ICD-10-CM | POA: Diagnosis not present

## 2018-01-16 DIAGNOSIS — M2550 Pain in unspecified joint: Secondary | ICD-10-CM

## 2018-01-16 DIAGNOSIS — D75839 Thrombocytosis, unspecified: Secondary | ICD-10-CM

## 2018-01-16 DIAGNOSIS — E559 Vitamin D deficiency, unspecified: Secondary | ICD-10-CM

## 2018-01-16 DIAGNOSIS — K219 Gastro-esophageal reflux disease without esophagitis: Secondary | ICD-10-CM | POA: Diagnosis not present

## 2018-01-16 DIAGNOSIS — I10 Essential (primary) hypertension: Secondary | ICD-10-CM

## 2018-01-16 LAB — MICROSCOPIC EXAMINATION
Bacteria, UA: NONE SEEN
RBC, UA: NONE SEEN /hpf (ref 0–2)

## 2018-01-16 LAB — UA/M W/RFLX CULTURE, ROUTINE
Bilirubin, UA: NEGATIVE
Ketones, UA: NEGATIVE
LEUKOCYTES UA: NEGATIVE
Nitrite, UA: NEGATIVE
PH UA: 5 (ref 5.0–7.5)
PROTEIN UA: NEGATIVE
RBC, UA: NEGATIVE
Urobilinogen, Ur: 0.2 mg/dL (ref 0.2–1.0)

## 2018-01-16 LAB — MICROALBUMIN, URINE WAIVED
CREATININE, URINE WAIVED: 300 mg/dL (ref 10–300)
MICROALB, UR WAIVED: 30 mg/L — AB (ref 0–19)
Microalb/Creat Ratio: <30 mg/g (ref <30)

## 2018-01-16 LAB — BAYER DCA HB A1C WAIVED: HB A1C: 7.6 % — AB (ref <7.0)

## 2018-01-16 MED ORDER — LISINOPRIL 5 MG PO TABS: 5.0000 mg | ORAL_TABLET | Freq: Every day | ORAL | 1 refills | Status: DC

## 2018-01-16 MED ORDER — EMPAGLIFLOZIN 25 MG PO TABS: 25.0000 mg | ORAL_TABLET | Freq: Every day | ORAL | 3 refills | Status: DC

## 2018-01-16 MED ORDER — METFORMIN HCL ER 500 MG PO TB24: 1000.0000 mg | ORAL_TABLET | Freq: Two times a day (BID) | ORAL | 1 refills | Status: DC

## 2018-01-16 MED ORDER — ALBUTEROL SULFATE HFA 108 (90 BASE) MCG/ACT IN AERS: 2.0000 | INHALATION_SPRAY | Freq: Four times a day (QID) | RESPIRATORY_TRACT | 1 refills | Status: DC | PRN

## 2018-01-16 MED ORDER — FLUTICASONE PROPIONATE 50 MCG/ACT NA SUSP: NASAL | 12 refills | Status: DC
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
## 2018-01-16 NOTE — Assessment & Plan Note (Addendum)
Going in the wrong direction with A1c of 7.6 up from 7.2- will start jardiance and recheck 3 months. Call with any concerns.

## 2018-01-16 NOTE — Assessment & Plan Note (Signed)
Rechecking levels today. Await results.  

## 2018-01-16 NOTE — Assessment & Plan Note (Signed)
Under good control on current regimen. Continue current regimen. Continue to monitor. Call with any concerns. Refills given. Labs checked today.  

## 2018-01-16 NOTE — Assessment & Plan Note (Signed)
Under good control on current regimen. Continue current regimen. Continue to monitor. Call with any concerns. Refills given. Labs drawn today.   

## 2018-01-16 NOTE — Patient Instructions (Signed)

## 2018-01-16 NOTE — Assessment & Plan Note (Signed)
Continue to follow with rheumatology. Stable. Call with any concerns.

## 2018-01-17 ENCOUNTER — Encounter: Payer: Self-pay | Admitting: Family Medicine

## 2018-01-17 LAB — COMPREHENSIVE METABOLIC PANEL
ALBUMIN: 4.4 g/dL (ref 3.5–5.5)
ALK PHOS: 78 IU/L (ref 39–117)
ALT: 20 IU/L (ref 0–44)
AST: 16 IU/L (ref 0–40)
Albumin/Globulin Ratio: 1.9 (ref 1.2–2.2)
BILIRUBIN TOTAL: 0.3 mg/dL (ref 0.0–1.2)
BUN / CREAT RATIO: 14 (ref 9–20)
BUN: 10 mg/dL (ref 6–24)
CHLORIDE: 102 mmol/L (ref 96–106)
CO2: 21 mmol/L (ref 20–29)
Calcium: 9.2 mg/dL (ref 8.7–10.2)
Creatinine, Ser: 0.7 mg/dL — ABNORMAL LOW (ref 0.76–1.27)
GFR calc Af Amer: 128 mL/min/{1.73_m2} (ref >59)
GFR calc non Af Amer: 111 mL/min/{1.73_m2} (ref >59)
GLOBULIN, TOTAL: 2.3 g/dL (ref 1.5–4.5)
Glucose: 195 mg/dL — ABNORMAL HIGH (ref 65–99)
POTASSIUM: 4.4 mmol/L (ref 3.5–5.2)
SODIUM: 139 mmol/L (ref 134–144)
Total Protein: 6.7 g/dL (ref 6.0–8.5)

## 2018-01-17 LAB — LIPID PANEL W/O CHOL/HDL RATIO
CHOLESTEROL TOTAL: 236 mg/dL — AB (ref 100–199)
HDL: 27 mg/dL — ABNORMAL LOW (ref >39)
Triglycerides: 600 mg/dL (ref 0–149)

## 2018-01-17 LAB — CBC WITH DIFFERENTIAL/PLATELET
BASOS ABS: 0.2 10*3/uL (ref 0.0–0.2)
Basos: 1 %
EOS (ABSOLUTE): 0.2 10*3/uL (ref 0.0–0.4)
EOS: 2 %
HEMATOCRIT: 43.9 % (ref 37.5–51.0)
Hemoglobin: 15.5 g/dL (ref 13.0–17.7)
Immature Grans (Abs): 0.2 10*3/uL — ABNORMAL HIGH (ref 0.0–0.1)
Immature Granulocytes: 1 %
LYMPHS ABS: 3.4 10*3/uL — AB (ref 0.7–3.1)
Lymphs: 28 %
MCH: 32 pg (ref 26.6–33.0)
MCHC: 35.3 g/dL (ref 31.5–35.7)
MCV: 91 fL (ref 79–97)
MONOS ABS: 0.5 10*3/uL (ref 0.1–0.9)
Monocytes: 4 %
Neutrophils Absolute: 7.7 10*3/uL — ABNORMAL HIGH (ref 1.4–7.0)
Neutrophils: 64 %
PLATELETS: 456 10*3/uL — AB (ref 150–450)
RBC: 4.84 x10E6/uL (ref 4.14–5.80)
RDW: 13 % (ref 11.6–15.4)
WBC: 12.2 10*3/uL — AB (ref 3.4–10.8)

## 2018-01-17 LAB — VITAMIN D 25 HYDROXY (VIT D DEFICIENCY, FRACTURES): VIT D 25 HYDROXY: 26.4 ng/mL — AB (ref 30.0–100.0)

## 2018-01-17 LAB — PSA: Prostate Specific Ag, Serum: 0.4 ng/mL (ref 0.0–4.0)

## 2018-01-17 LAB — TSH: TSH: 0.577 u[IU]/mL (ref 0.450–4.500)

## 2018-03-15 ENCOUNTER — Other Ambulatory Visit: Payer: Self-pay

## 2018-03-15 ENCOUNTER — Other Ambulatory Visit
Admission: RE | Admit: 2018-03-15 | Discharge: 2018-03-15 | Disposition: A | Discharge status: Home / Self Care | Payer: 59 | Source: Ambulatory Visit | Attending: Internal Medicine | Admitting: Internal Medicine

## 2018-03-15 DIAGNOSIS — M1990 Unspecified osteoarthritis, unspecified site: Secondary | ICD-10-CM | POA: Diagnosis not present

## 2018-03-15 DIAGNOSIS — D473 Essential (hemorrhagic) thrombocythemia: Secondary | ICD-10-CM | POA: Diagnosis not present

## 2018-03-15 DIAGNOSIS — E559 Vitamin D deficiency, unspecified: Secondary | ICD-10-CM | POA: Diagnosis not present

## 2018-03-15 DIAGNOSIS — Z79899 Other long term (current) drug therapy: Secondary | ICD-10-CM | POA: Insufficient documentation

## 2018-03-15 LAB — CBC WITH DIFFERENTIAL/PLATELET
Abs Immature Granulocytes: 0.12 10*3/uL — ABNORMAL HIGH (ref 0.00–0.07)
Basophils Absolute: 0.1 10*3/uL (ref 0.0–0.1)
Basophils Relative: 1 %
Eosinophils Absolute: 0.3 10*3/uL (ref 0.0–0.5)
Eosinophils Relative: 2 %
HCT: 45.2 % (ref 39.0–52.0)
Hemoglobin: 16 g/dL (ref 13.0–17.0)
Immature Granulocytes: 1 %
Lymphocytes Relative: 24 %
Lymphs Abs: 2.9 10*3/uL (ref 0.7–4.0)
MCH: 32.5 pg (ref 26.0–34.0)
MCHC: 35.4 g/dL (ref 30.0–36.0)
MCV: 91.7 fL (ref 80.0–100.0)
Monocytes Absolute: 0.6 10*3/uL (ref 0.1–1.0)
Monocytes Relative: 5 %
Neutro Abs: 8.3 10*3/uL — ABNORMAL HIGH (ref 1.7–7.7)
Neutrophils Relative %: 67 %
Platelets: 424 10*3/uL — ABNORMAL HIGH (ref 150–400)
RBC: 4.93 MIL/uL (ref 4.22–5.81)
RDW: 13.6 % (ref 11.5–15.5)
WBC: 12.3 10*3/uL — ABNORMAL HIGH (ref 4.0–10.5)
nRBC: 0 % (ref 0.0–0.2)

## 2018-03-15 LAB — CREATININE, SERUM
Creatinine, Ser: 0.71 mg/dL (ref 0.61–1.24)
GFR calc Af Amer: >60 mL/min (ref >60)
GFR calc non Af Amer: >60 mL/min (ref >60)

## 2018-03-15 LAB — AST: AST: 21 U/L (ref 15–41)

## 2018-03-15 LAB — C-REACTIVE PROTEIN: CRP: 2.9 mg/dL — ABNORMAL HIGH (ref <1.0)

## 2018-03-15 LAB — ALT: ALT: 16 U/L (ref 0–44)

## 2018-04-06 ENCOUNTER — Other Ambulatory Visit
Admission: RE | Admit: 2018-04-06 | Discharge: 2018-04-06 | Disposition: A | Discharge status: Home / Self Care | Payer: 59 | Source: Ambulatory Visit | Attending: Internal Medicine | Admitting: Internal Medicine

## 2018-04-06 DIAGNOSIS — E559 Vitamin D deficiency, unspecified: Secondary | ICD-10-CM | POA: Diagnosis not present

## 2018-04-07 LAB — VITAMIN D 25 HYDROXY (VIT D DEFICIENCY, FRACTURES): Vit D, 25-Hydroxy: 21.6 ng/mL — ABNORMAL LOW (ref 30.0–100.0)

## 2018-04-17 ENCOUNTER — Telehealth: Payer: Self-pay | Admitting: Family Medicine

## 2018-04-17 DIAGNOSIS — E1165 Type 2 diabetes mellitus with hyperglycemia: Secondary | ICD-10-CM

## 2018-04-17 NOTE — Telephone Encounter (Signed)
Copied from [place] [id number]. Topic: Appointment Scheduling - Scheduling Inquiry for Clinic >> Apr 16, 2018 12:11 PM Lennox Solders wrote: Reason for CRM:Pt is calling to [facility]sc appt on 04-19-2018 >> Apr 16, 2018  4:16 PM Stark Klein wrote: LVM for pt that we would do virtual visit and he would come in for labs separate.  >> Apr 17, 2018 12:38 PM Ahmed Prima L wrote: Patient would like to be called at [phone] to get his appt on 4/9 rescheduled. >> Apr 17, 2018  2:47 PM Stark Klein wrote: Pt would like to know if he can come in for labs and then do a telephone visit at 4pm after he gets off work. Please advise.

## 2018-04-17 NOTE — Telephone Encounter (Signed)
Lab order in. Can come in when he is able for labs.

## 2018-04-18 NOTE — Telephone Encounter (Signed)
Spoke with pt and he stated that he would come in and get labs done Friday.

## 2018-04-19 ENCOUNTER — Ambulatory Visit: Payer: 59 | Admitting: Family Medicine

## 2018-04-20 ENCOUNTER — Other Ambulatory Visit: Payer: Self-pay

## 2018-04-20 ENCOUNTER — Other Ambulatory Visit: Payer: 59

## 2018-04-20 DIAGNOSIS — E1165 Type 2 diabetes mellitus with hyperglycemia: Secondary | ICD-10-CM | POA: Diagnosis not present

## 2018-04-20 LAB — BAYER DCA HB A1C WAIVED: HB A1C (BAYER DCA - WAIVED): 7.1 % — ABNORMAL HIGH (ref <7.0)

## 2018-05-04 ENCOUNTER — Telehealth: Payer: Self-pay

## 2018-05-04 NOTE — Telephone Encounter (Signed)
Received a prior authorization for Ozempic. Patient is not currently taking this medication. It was prescribed a year ago. The PA came from sponsored renewals which means it was an automatic PA renewal from last year. PA deleted and reasoning is patient not longer taking this medication.

## 2018-05-14 ENCOUNTER — Other Ambulatory Visit: Payer: Self-pay | Admitting: Family Medicine

## 2018-09-03 ENCOUNTER — Other Ambulatory Visit: Payer: Self-pay | Admitting: Family Medicine

## 2018-09-03 NOTE — Telephone Encounter (Signed)
Requested medication (s) are due for refill today: yes  Requested medication (s) are on the active medication list: yes  Last refill:  05/14/2018 and 01/16/2018  Future visit scheduled: no  Notes to clinic: Spoke with patient and gave him information to call office and get schedule for appointment.    Requested Prescriptions  Pending Prescriptions Disp Refills   JARDIANCE 25 MG TABS tablet [Pharmacy Med Name: JARDIANCE 25 MG TABLET 25 TAB] 30 tablet 0    Sig: TAKE 1 TABLET BY MOUTH ONCE DAILY     Endocrinology:  Diabetes - SGLT2 Inhibitors Failed - 09/03/2018  9:30 AM      Failed - Valid encounter within last 6 months    Recent Outpatient Visits          7 months ago Routine general medical examination at a health care facility   [facility], [place], DO   11 months ago Essential hypertension   [place], [place], DO   1 year ago Type 2 diabetes mellitus with hyperglycemia, without long-term current use of insulin ([place])   [place], Megan P, DO   1 year ago Arthralgia, unspecified joint   [place], Megan P, DO   1 year ago Arthralgia, unspecified joint   [place], Megan P, DO             Passed - Cr in normal range and within 360 days    Creatinine  Date Value Ref Range Status  02/11/2013 0.82 0.60 - 1.30 mg/dL Final   Creatinine, Ser  Date Value Ref Range Status  03/15/2018 0.71 0.61 - 1.24 mg/dL Final         Passed - LDL in normal range and within 360 days    LDL Calculated  Date Value Ref Range Status  01/16/2018 Comment 0 - 99 mg/dL Final    Comment:    Triglyceride result indicated is too high for an accurate LDL cholesterol estimation.          Passed - HBA1C is between 0 and 7.9 and within 180 days    Hemoglobin A1C  Date Value Ref Range Status  09/22/2015 8.1  Final   HB A1C (BAYER DCA - WAIVED)  Date Value Ref Range Status  04/20/2018  7.1 (H) <7.0 % Final    Comment:                                          Diabetic Adult            <7.0                                       Healthy Adult        4.3 - 5.7                                                           (DCCT/NGSP) American Diabetes Association's Summary of Glycemic Recommendations for Adults with Diabetes: Hemoglobin A1c <7.0%. More stringent glycemic goals (A1c <6.0%) may further reduce complications at the  cost of increased risk of hypoglycemia.          Passed - eGFR in normal range and within 360 days    EGFR (African American)  Date Value Ref Range Status  02/11/2013 >60  Final   GFR calc Af Amer  Date Value Ref Range Status  03/15/2018 >60 >60 mL/min Final    Comment:    Performed at [facility], [address] [place], [place] [address]   EGFR Laqueta Jean.)  Date Value Ref Range Status  02/11/2013 >60  Final    Comment:    eGFR values <66m/min/1.73 m2 may be an indication of chronic kidney disease (CKD). Calculated eGFR is useful in patients with stable renal function. The eGFR calculation will not be reliable in acutely ill patients when serum creatinine is changing rapidly. It is not useful in  patients on dialysis. The eGFR calculation may not be applicable to patients at the low and high extremes of body sizes, pregnant women, and vegetarians. ALTI - Serum/plasma was lipemic. Test was performed  - on ultrafuged specimen.  - TPL POTASSIUM/AST - Slight hemolysis, interpret results with  - caution...tpl    GFR calc non Af Amer  Date Value Ref Range Status  03/15/2018 >60 >60 mL/min Final          lisinopril (ZESTRIL) 5 MG tablet [Pharmacy Med Name: LISINOPRIL 5 MG TAB 5 TAB] 90 tablet 1    Sig: TAKE 1 TABLET (5 MG TOTAL) BY MOUTH DAILY.     Cardiovascular:  ACE Inhibitors Failed - 09/03/2018  9:30 AM      Failed - K in normal range and within 180 days    Potassium  Date Value Ref Range Status  01/16/2018  4.4 3.5 - 5.2 mmol/L Final  02/11/2013 3.9 3.5 - 5.1 mmol/L Final         Failed - Valid encounter within last 6 months    Recent Outpatient Visits          7 months ago Routine general medical examination at a health care facility   [facility]   11 months ago Essential hypertension   [place]   1 year ago Type 2 diabetes mellitus with hyperglycemia, without long-term current use of insulin ([place], [place]   1 year ago Arthralgia, unspecified joint   [place], [place]   1 year ago Arthralgia, unspecified joint   [place], DO             Passed - Cr in normal range and within 180 days    Creatinine  Date Value Ref Range Status  02/11/2013 0.82 0.60 - 1.30 mg/dL Final   Creatinine, Ser  Date Value Ref Range Status  03/15/2018 0.71 0.61 - 1.24 mg/dL Final         Passed - Patient is not pregnant      Passed - Last BP in normal range    BP Readings from Last 1 Encounters:  01/16/18 134/80

## 2018-09-03 NOTE — Telephone Encounter (Signed)
See notes below from [facility].

## 2018-09-07 DIAGNOSIS — M4317 Spondylolisthesis, lumbosacral region: Secondary | ICD-10-CM | POA: Diagnosis not present

## 2018-09-07 DIAGNOSIS — J9811 Atelectasis: Secondary | ICD-10-CM | POA: Diagnosis not present

## 2018-09-07 DIAGNOSIS — R42 Dizziness and giddiness: Secondary | ICD-10-CM | POA: Diagnosis not present

## 2018-09-07 DIAGNOSIS — S199XXA Unspecified injury of neck, initial encounter: Secondary | ICD-10-CM | POA: Diagnosis not present

## 2018-09-07 DIAGNOSIS — R000 Tachycardia, unspecified: Secondary | ICD-10-CM | POA: Diagnosis not present

## 2018-09-07 DIAGNOSIS — R531 Weakness: Secondary | ICD-10-CM | POA: Diagnosis not present

## 2018-09-07 DIAGNOSIS — Z20828 Contact with and (suspected) exposure to other viral communicable diseases: Secondary | ICD-10-CM | POA: Diagnosis not present

## 2018-09-07 DIAGNOSIS — M4802 Spinal stenosis, cervical region: Secondary | ICD-10-CM | POA: Diagnosis not present

## 2018-09-07 DIAGNOSIS — S3992XA Unspecified injury of lower back, initial encounter: Secondary | ICD-10-CM | POA: Diagnosis not present

## 2018-09-07 DIAGNOSIS — R202 Paresthesia of skin: Secondary | ICD-10-CM | POA: Diagnosis not present

## 2018-09-07 DIAGNOSIS — M48061 Spinal stenosis, lumbar region without neurogenic claudication: Secondary | ICD-10-CM | POA: Diagnosis not present

## 2018-09-07 DIAGNOSIS — S299XXA Unspecified injury of thorax, initial encounter: Secondary | ICD-10-CM | POA: Diagnosis not present

## 2018-09-07 DIAGNOSIS — R52 Pain, unspecified: Secondary | ICD-10-CM | POA: Diagnosis not present

## 2018-09-07 DIAGNOSIS — S0990XA Unspecified injury of head, initial encounter: Secondary | ICD-10-CM | POA: Diagnosis not present

## 2018-09-07 DIAGNOSIS — S3993XA Unspecified injury of pelvis, initial encounter: Secondary | ICD-10-CM | POA: Diagnosis not present

## 2018-09-07 DIAGNOSIS — M542 Cervicalgia: Secondary | ICD-10-CM | POA: Diagnosis not present

## 2018-09-07 DIAGNOSIS — Y33XXXA Other specified events, undetermined intent, initial encounter: Secondary | ICD-10-CM | POA: Diagnosis not present

## 2018-09-07 DIAGNOSIS — Y92410 Unspecified street and highway as the place of occurrence of the external cause: Secondary | ICD-10-CM | POA: Diagnosis not present

## 2018-09-07 DIAGNOSIS — Z041 Encounter for examination and observation following transport accident: Secondary | ICD-10-CM | POA: Diagnosis not present

## 2018-09-08 DIAGNOSIS — M4317 Spondylolisthesis, lumbosacral region: Secondary | ICD-10-CM | POA: Diagnosis not present

## 2018-09-08 DIAGNOSIS — S0990XA Unspecified injury of head, initial encounter: Secondary | ICD-10-CM | POA: Diagnosis not present

## 2018-09-08 DIAGNOSIS — Z20828 Contact with and (suspected) exposure to other viral communicable diseases: Secondary | ICD-10-CM | POA: Diagnosis not present

## 2018-09-08 DIAGNOSIS — M48061 Spinal stenosis, lumbar region without neurogenic claudication: Secondary | ICD-10-CM | POA: Diagnosis not present

## 2018-09-08 DIAGNOSIS — R000 Tachycardia, unspecified: Secondary | ICD-10-CM | POA: Diagnosis not present

## 2018-09-08 DIAGNOSIS — R202 Paresthesia of skin: Secondary | ICD-10-CM | POA: Diagnosis not present

## 2018-09-08 DIAGNOSIS — R531 Weakness: Secondary | ICD-10-CM | POA: Diagnosis not present

## 2018-09-08 DIAGNOSIS — M542 Cervicalgia: Secondary | ICD-10-CM | POA: Diagnosis not present

## 2018-09-08 DIAGNOSIS — M4802 Spinal stenosis, cervical region: Secondary | ICD-10-CM | POA: Diagnosis not present

## 2018-09-12 ENCOUNTER — Encounter: Payer: Self-pay | Admitting: Family Medicine

## 2018-09-12 ENCOUNTER — Other Ambulatory Visit: Payer: Self-pay

## 2018-09-12 ENCOUNTER — Ambulatory Visit (INDEPENDENT_AMBULATORY_CARE_PROVIDER_SITE_OTHER): Payer: 59 | Admitting: Family Medicine

## 2018-09-12 DIAGNOSIS — E1165 Type 2 diabetes mellitus with hyperglycemia: Secondary | ICD-10-CM | POA: Diagnosis not present

## 2018-09-12 DIAGNOSIS — E782 Mixed hyperlipidemia: Secondary | ICD-10-CM

## 2018-09-12 DIAGNOSIS — I10 Essential (primary) hypertension: Secondary | ICD-10-CM

## 2018-09-12 LAB — MICROALBUMIN, URINE WAIVED
Creatinine, Urine Waived: 50 mg/dL (ref 10–300)
Microalb, Ur Waived: 10 mg/L (ref 0–19)
Microalb/Creat Ratio: <30 mg/g (ref <30)

## 2018-09-12 LAB — BAYER DCA HB A1C WAIVED: HB A1C (BAYER DCA - WAIVED): 6.4 % (ref <7.0)
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
## 2018-09-13 ENCOUNTER — Telehealth: Payer: Self-pay | Admitting: Family Medicine

## 2018-09-13 ENCOUNTER — Encounter: Payer: Self-pay | Admitting: Family Medicine

## 2018-09-13 LAB — COMPREHENSIVE METABOLIC PANEL
ALT: 11 IU/L (ref 0–44)
AST: 12 IU/L (ref 0–40)
Albumin/Globulin Ratio: 1.8 (ref 1.2–2.2)
Albumin: 4.4 g/dL (ref 4.0–5.0)
Alkaline Phosphatase: 76 IU/L (ref 39–117)
BUN/Creatinine Ratio: 14 (ref 9–20)
BUN: 11 mg/dL (ref 6–24)
Bilirubin Total: 0.2 mg/dL (ref 0.0–1.2)
CO2: 17 mmol/L — ABNORMAL LOW (ref 20–29)
Calcium: 9.3 mg/dL (ref 8.7–10.2)
Chloride: 99 mmol/L (ref 96–106)
Creatinine, Ser: 0.79 mg/dL (ref 0.76–1.27)
GFR calc Af Amer: 122 mL/min/{1.73_m2} (ref >59)
GFR calc non Af Amer: 105 mL/min/{1.73_m2} (ref >59)
Globulin, Total: 2.4 g/dL (ref 1.5–4.5)
Glucose: 190 mg/dL — ABNORMAL HIGH (ref 65–99)
Potassium: 4.5 mmol/L (ref 3.5–5.2)
Sodium: 136 mmol/L (ref 134–144)
Total Protein: 6.8 g/dL (ref 6.0–8.5)

## 2018-09-13 LAB — LIPID PANEL W/O CHOL/HDL RATIO
Cholesterol, Total: 221 mg/dL — ABNORMAL HIGH (ref 100–199)
HDL: 29 mg/dL — ABNORMAL LOW (ref >39)
LDL Chol Calc (NIH): 99 mg/dL (ref 0–99)
Triglycerides: 548 mg/dL — ABNORMAL HIGH (ref 0–149)
VLDL Cholesterol Cal: 93 mg/dL — ABNORMAL HIGH (ref 5–40)

## 2018-09-13 MED ORDER — JARDIANCE 25 MG PO TABS: 25.0000 mg | ORAL_TABLET | Freq: Every day | ORAL | 1 refills | Status: DC

## 2018-09-13 MED ORDER — METFORMIN HCL ER 500 MG PO TB24: 1000.0000 mg | ORAL_TABLET | Freq: Two times a day (BID) | ORAL | 1 refills | Status: DC

## 2018-09-13 MED ORDER — LISINOPRIL 5 MG PO TABS: 5.0000 mg | ORAL_TABLET | Freq: Every day | ORAL | 1 refills | Status: DC

## 2018-09-13 MED ORDER — GABAPENTIN 100 MG PO CAPS: 100.0000 mg | ORAL_CAPSULE | Freq: Three times a day (TID) | ORAL | 0 refills | Status: DC

## 2018-09-13 MED ORDER — CYCLOBENZAPRINE HCL 10 MG PO TABS: 10.0000 mg | ORAL_TABLET | Freq: Every evening | ORAL | 1 refills | Status: DC | PRN

## 2018-09-13 NOTE — Telephone Encounter (Signed)
He did not ask for that medicine yesterday. I can send it through if he needs it. His other medications were sent in on 8/26- and should be waiting for him at the pharmacy. What does he need?

## 2018-09-13 NOTE — Telephone Encounter (Signed)
If he is talking about the gabapentin we discussed- he is supposed to let me know if he's not feeling better, and I'll send it in- he didn't say he wanted to start it yesterday.

## 2018-09-13 NOTE — Assessment & Plan Note (Signed)
Will check labs and get him in for BP ASAP. Call with any concerns. Continue to monitor. Refills given.

## 2018-09-13 NOTE — Assessment & Plan Note (Signed)
Under good control on current regimen. Continue current regimen. Continue to monitor. Call with any concerns. Refills given. Will check labs.   

## 2018-09-13 NOTE — Telephone Encounter (Signed)
He would like the flexeril sent, he states that he is having a lot of nerve pain in his hands and shoulders, if you could send in gabapentin also.

## 2018-09-13 NOTE — Telephone Encounter (Signed)
Pt states he connected Dr Wynetta Emery yesterday and she was going to call in a couple meds, but they are not at the pharmacy. He thinks one was cyclobenzaprine (FLEXERIL) 10 MG tablet  Not sure what else, but it it is for shoulder /back pain.  [place], [place] [phone] (Phone) [phone] (Fax)

## 2018-09-20 ENCOUNTER — Other Ambulatory Visit
Admission: RE | Admit: 2018-09-20 | Discharge: 2018-09-20 | Disposition: A | Discharge status: Home / Self Care | Payer: 59 | Attending: Internal Medicine | Admitting: Internal Medicine

## 2018-09-20 DIAGNOSIS — Z79899 Other long term (current) drug therapy: Secondary | ICD-10-CM | POA: Insufficient documentation

## 2018-09-20 DIAGNOSIS — G894 Chronic pain syndrome: Secondary | ICD-10-CM | POA: Diagnosis not present

## 2018-09-20 DIAGNOSIS — M5412 Radiculopathy, cervical region: Secondary | ICD-10-CM | POA: Insufficient documentation

## 2018-09-20 DIAGNOSIS — M1990 Unspecified osteoarthritis, unspecified site: Secondary | ICD-10-CM | POA: Diagnosis not present

## 2018-09-20 LAB — CBC WITH DIFFERENTIAL/PLATELET
Abs Immature Granulocytes: 0.1 10*3/uL — ABNORMAL HIGH (ref 0.00–0.07)
Basophils Absolute: 0.1 10*3/uL (ref 0.0–0.1)
Basophils Relative: 1 %
Eosinophils Absolute: 0.3 10*3/uL (ref 0.0–0.5)
Eosinophils Relative: 2 %
HCT: 45.6 % (ref 39.0–52.0)
Hemoglobin: 16.1 g/dL (ref 13.0–17.0)
Immature Granulocytes: 1 %
Lymphocytes Relative: 27 %
Lymphs Abs: 3.3 10*3/uL (ref 0.7–4.0)
MCH: 32.7 pg (ref 26.0–34.0)
MCHC: 35.3 g/dL (ref 30.0–36.0)
MCV: 92.5 fL (ref 80.0–100.0)
Monocytes Absolute: 0.8 10*3/uL (ref 0.1–1.0)
Monocytes Relative: 6 %
Neutro Abs: 7.8 10*3/uL — ABNORMAL HIGH (ref 1.7–7.7)
Neutrophils Relative %: 63 %
Platelets: 423 10*3/uL — ABNORMAL HIGH (ref 150–400)
RBC: 4.93 MIL/uL (ref 4.22–5.81)
RDW: 13.9 % (ref 11.5–15.5)
WBC: 12.4 10*3/uL — ABNORMAL HIGH (ref 4.0–10.5)
nRBC: 0 % (ref 0.0–0.2)

## 2018-09-27 DIAGNOSIS — M542 Cervicalgia: Secondary | ICD-10-CM | POA: Diagnosis not present

## 2018-09-27 DIAGNOSIS — M4722 Other spondylosis with radiculopathy, cervical region: Secondary | ICD-10-CM | POA: Diagnosis not present

## 2018-10-03 IMAGING — CR DG HAND 2V*R*
1 series · 2 of 2 positions shown · non-contrast
Comparison: None

CLINICAL DATA: Stiffness in BILATERAL hands, unable to bend finger
since [REDACTED], has had problem off and on for 6 months though,
history diabetes mellitus, COPD

EXAM:
RIGHT HAND - 2 VIEW

[Series 1: dg hand 2 view right · 0.14mm/px · 2 of 2 slices shown]
[im 1/2]
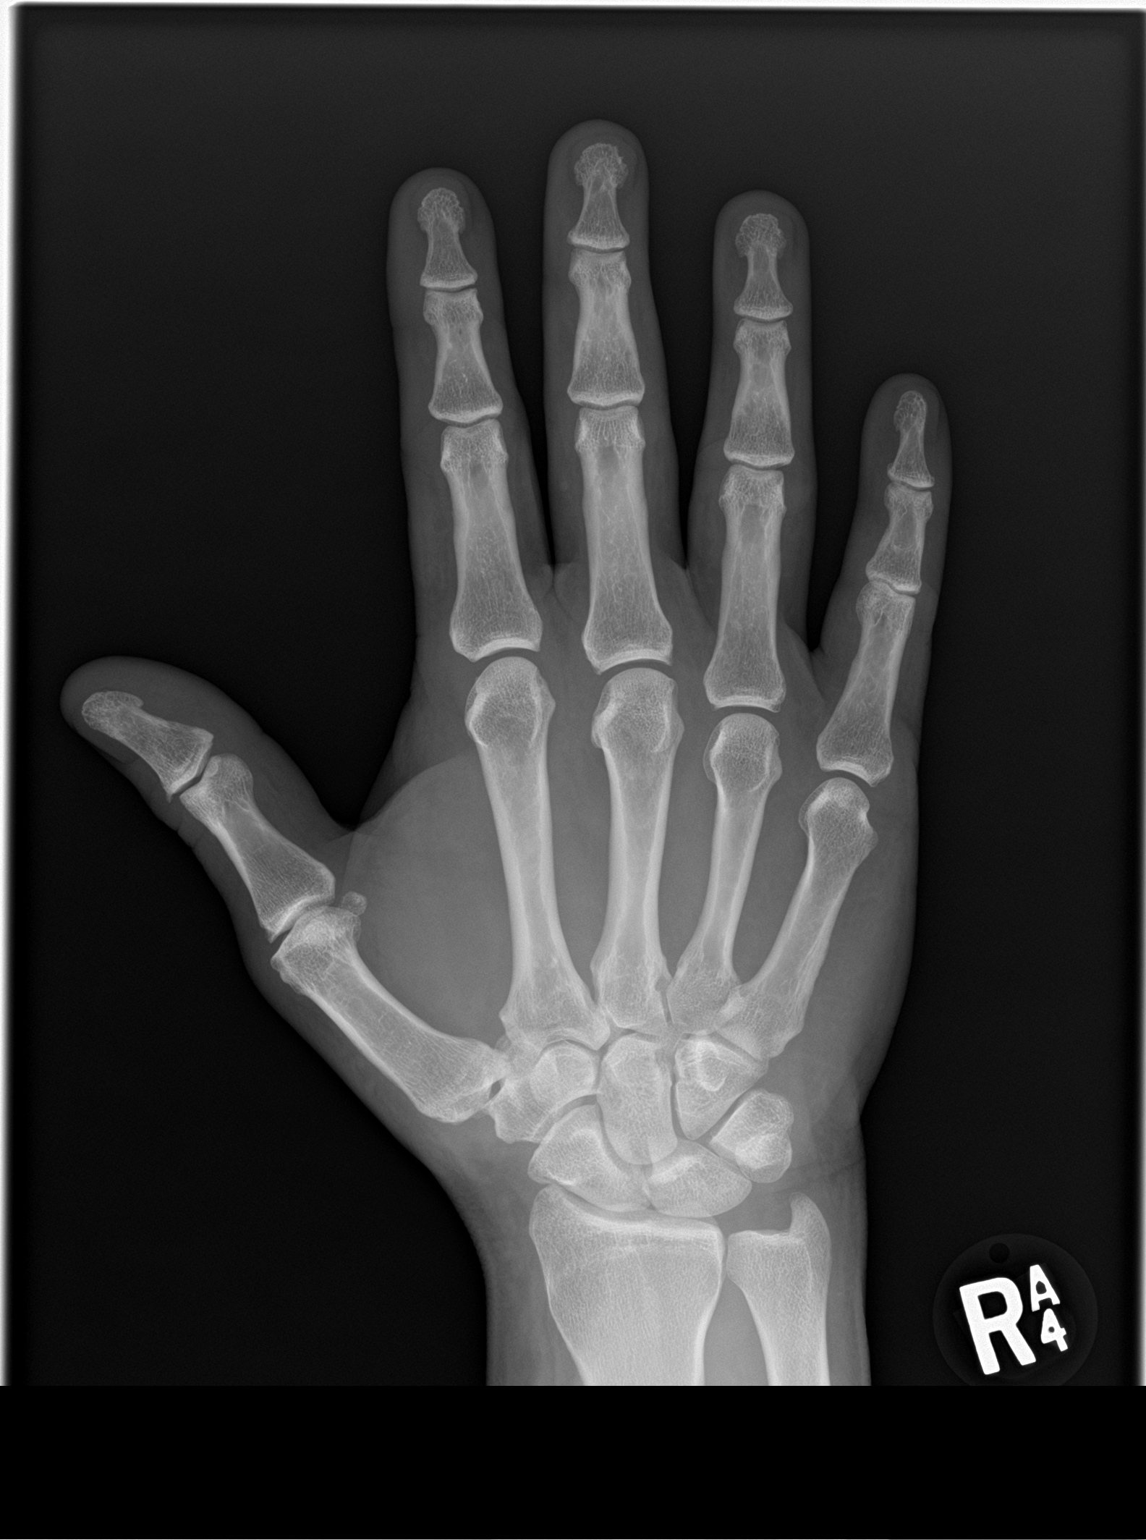
[im 2/2]
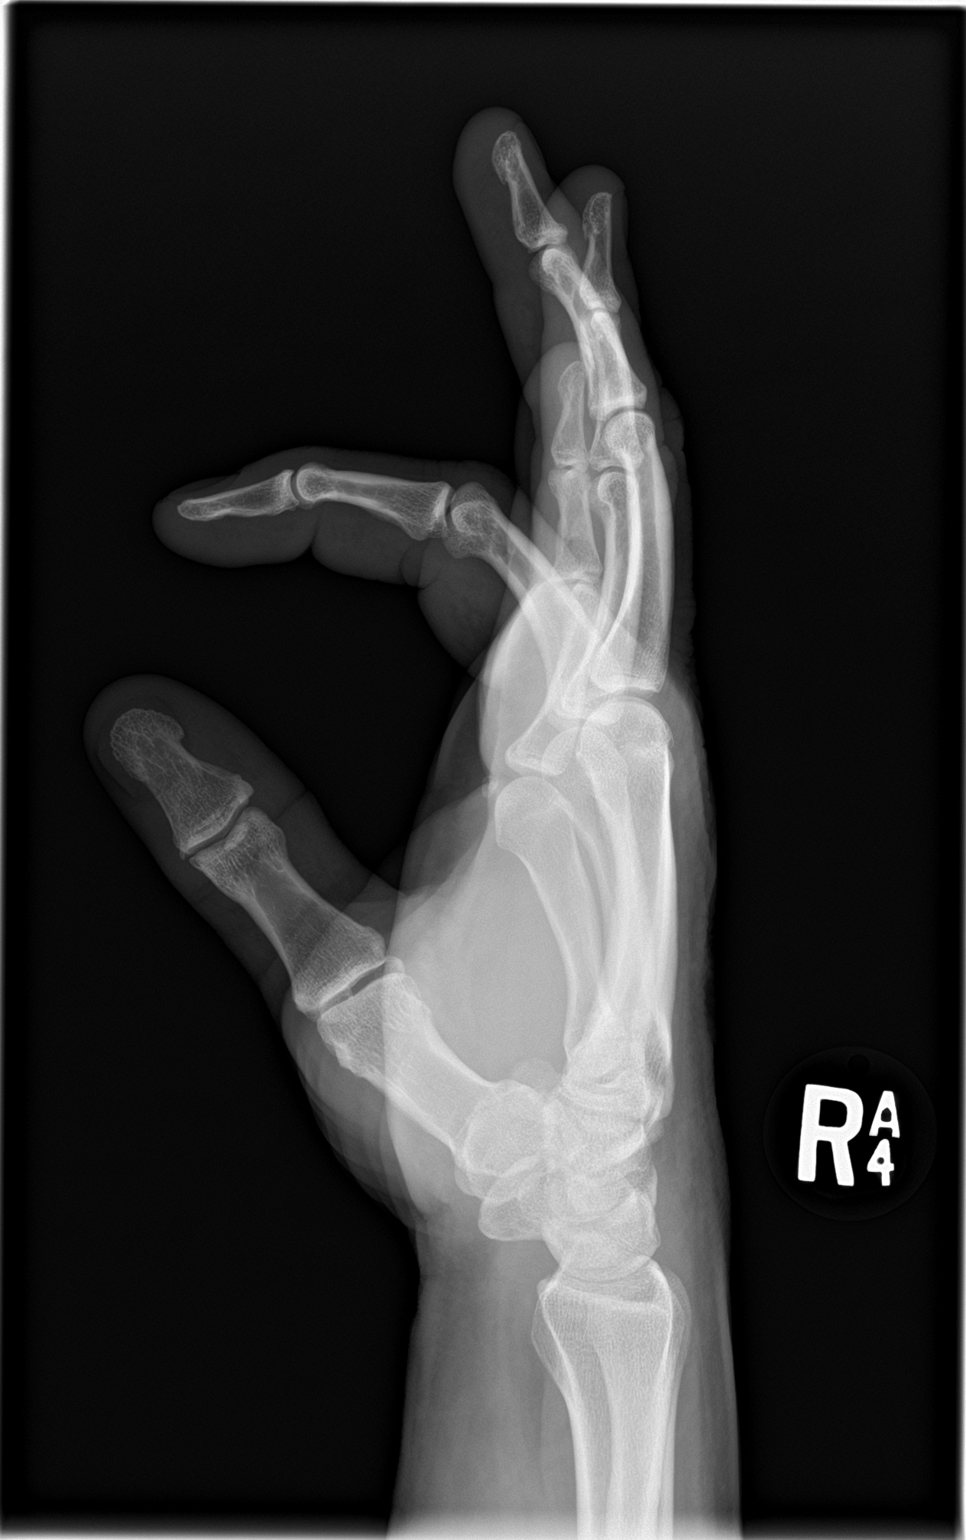

[2 of 2 positions shown; findings below may reference images not displayed]

FINDINGS: Osseous mineralization normal.

Joint spaces preserved.

No fracture, dislocation, or bone destruction.

No erosive or inflammatory changes.
IMPRESSION: Normal exam.

## 2018-10-04 ENCOUNTER — Other Ambulatory Visit: Payer: Self-pay | Admitting: Neurosurgery

## 2018-10-04 DIAGNOSIS — G959 Disease of spinal cord, unspecified: Secondary | ICD-10-CM | POA: Diagnosis not present

## 2018-10-05 ENCOUNTER — Other Ambulatory Visit: Payer: Self-pay

## 2018-10-05 ENCOUNTER — Encounter
Admission: RE | Admit: 2018-10-05 | Discharge: 2018-10-05 | Disposition: A | Discharge status: Home / Self Care | Payer: 59 | Source: Ambulatory Visit | Attending: Neurosurgery | Admitting: Neurosurgery

## 2018-10-05 DIAGNOSIS — Z20828 Contact with and (suspected) exposure to other viral communicable diseases: Secondary | ICD-10-CM | POA: Insufficient documentation

## 2018-10-05 DIAGNOSIS — Z01810 Encounter for preprocedural cardiovascular examination: Secondary | ICD-10-CM | POA: Insufficient documentation

## 2018-10-05 DIAGNOSIS — I10 Essential (primary) hypertension: Secondary | ICD-10-CM | POA: Insufficient documentation

## 2018-10-05 DIAGNOSIS — E119 Type 2 diabetes mellitus without complications: Secondary | ICD-10-CM | POA: Diagnosis not present

## 2018-10-05 DIAGNOSIS — Z01812 Encounter for preprocedural laboratory examination: Secondary | ICD-10-CM | POA: Insufficient documentation

## 2018-10-05 DIAGNOSIS — Z0184 Encounter for antibody response examination: Secondary | ICD-10-CM | POA: Insufficient documentation

## 2018-10-05 DIAGNOSIS — M4712 Other spondylosis with myelopathy, cervical region: Secondary | ICD-10-CM | POA: Diagnosis not present

## 2018-10-05 DIAGNOSIS — R9431 Abnormal electrocardiogram [ECG] [EKG]: Secondary | ICD-10-CM | POA: Diagnosis not present

## 2018-10-05 LAB — BASIC METABOLIC PANEL
Anion gap: 9 (ref 5–15)
BUN: 9 mg/dL (ref 6–20)
CO2: 24 mmol/L (ref 22–32)
Calcium: 8.9 mg/dL (ref 8.9–10.3)
Chloride: 107 mmol/L (ref 98–111)
Creatinine, Ser: 0.57 mg/dL — ABNORMAL LOW (ref 0.61–1.24)
GFR calc Af Amer: >60 mL/min (ref >60)
GFR calc non Af Amer: >60 mL/min (ref >60)
Glucose, Bld: 103 mg/dL — ABNORMAL HIGH (ref 70–99)
Potassium: 4 mmol/L (ref 3.5–5.1)
Sodium: 140 mmol/L (ref 135–145)

## 2018-10-05 LAB — CBC
HCT: 43.5 % (ref 39.0–52.0)
Hemoglobin: 15.3 g/dL (ref 13.0–17.0)
MCH: 32.3 pg (ref 26.0–34.0)
MCHC: 35.2 g/dL (ref 30.0–36.0)
MCV: 92 fL (ref 80.0–100.0)
Platelets: 411 10*3/uL — ABNORMAL HIGH (ref 150–400)
RBC: 4.73 MIL/uL (ref 4.22–5.81)
RDW: 13.7 % (ref 11.5–15.5)
WBC: 12.4 10*3/uL — ABNORMAL HIGH (ref 4.0–10.5)
nRBC: 0 % (ref 0.0–0.2)

## 2018-10-05 LAB — TYPE AND SCREEN
ABO/RH(D): A POS
Antibody Screen: NEGATIVE

## 2018-10-05 LAB — URINALYSIS, ROUTINE W REFLEX MICROSCOPIC
Bacteria, UA: NONE SEEN
Bilirubin Urine: NEGATIVE
Glucose, UA: >=500 mg/dL — AB
Hgb urine dipstick: NEGATIVE
Ketones, ur: NEGATIVE mg/dL
Leukocytes,Ua: NEGATIVE
Nitrite: NEGATIVE
Protein, ur: NEGATIVE mg/dL
Specific Gravity, Urine: 1.035 — ABNORMAL HIGH (ref 1.005–1.030)
WBC, UA: NONE SEEN WBC/hpf (ref 0–5)
pH: 5 (ref 5.0–8.0)

## 2018-10-05 LAB — PROTIME-INR
INR: 1.1 (ref 0.8–1.2)
Prothrombin Time: 13.7 seconds (ref 11.4–15.2)

## 2018-10-05 LAB — APTT: aPTT: 42 seconds — ABNORMAL HIGH (ref 24–36)

## 2018-10-05 LAB — SURGICAL PCR SCREEN
MRSA, PCR: NEGATIVE
Staphylococcus aureus: NEGATIVE

## 2018-10-05 NOTE — Patient Instructions (Signed)
Your procedure is scheduled on: Wednesday 10/10/18  Report to [place]. To find out your arrival time please call [phone] between 1PM - 3PM on Tuesday 10/08/28.   Remember: Instructions that are not followed completely may result in serious medical risk, up to and including death, or upon the discretion of your surgeon and anesthesiologist your surgery may need to be rescheduled.      _X__ 1. Do not eat food after midnight the night before your procedure.                 No gum chewing or hard candies. You may drink clear liquids up to 2 hours                 before you are scheduled to arrive for your surgery- DO NOT drink clear                 liquids within 2 hours of the start of your surgery.                 Clear Liquids include:  water, apple juice without pulp, clear carbohydrate                 drink such as Clearfast or Gatorade, Black Coffee or Tea (Do not add                 milk or creamer to coffee or tea).   __X__2.  On the morning of surgery brush your teeth with toothpaste and water, you may rinse your mouth with mouthwash if you wish.  Do not swallow any toothpaste or mouthwash.      _X__ 3.  No Alcohol for 24 hours before or after surgery.    _X__ 4.  Do Not Smoke or use e-cigarettes For 24 Hours Prior to Your Surgery.                 Do not use any chewable tobacco products for at least 6 hours prior to                 surgery.   __X__6.  Notify your doctor if there is any change in your medical condition      (cold, fever, infections).      Do not wear jewelry, make-up, hairpins, clips or nail polish. Do not wear lotions, powders, or perfumes.  Do not shave 48 hours prior to surgery. Men may shave face and neck. Do not bring valuables to the hospital.     [facility] is not responsible for any belongings or valuables.   Contacts, dentures/partials or body piercings may not be worn into  surgery. Bring a case for your contacts, glasses or hearing aids, a denture cup will be supplied.   Patients discharged the day of surgery will not be allowed to drive home.    Please read over the following fact sheets that you were given:   MRSA Information    __X__ Take these medicines the morning of surgery with A SIP OF WATER:     1. albuterol (PROVENTIL HFA;VENTOLIN HFA) 108 (90 Base) MCG/ACT inhaler  2. gabapentin (NEURONTIN) 100 MG capsule (If you normally take it in the morning)  3. HYDROcodone-acetaminophen (NORCO/VICODIN) 5-325 MG tablet      __X__ Use CHG Soap as directed   _ X___ Use inhalers on the day of surgery. Also bring the inhaler with you to the  hospital on the morning of surgery.   __X__ Stop Metformin 2 days prior to surgery: Sunday 10/07/18.    __X__ Stop Anti-inflammatories 7 days before surgery such as Advil, Ibuprofen, Motrin, BC or Goodies Powder, Naprosyn, Naproxen, Aleve, Aspirin, Meloxicam. May take Tylenol or HYDROcodone-acetaminophen (NORCO/VICODIN) 5-325 MG tablet if needed for pain or discomfort.     __X__ Don't begin taking any new herbal supplements before your surgery.

## 2018-10-06 LAB — SARS CORONAVIRUS 2 (TAT 6-24 HRS): SARS Coronavirus 2: NEGATIVE

## 2018-10-10 ENCOUNTER — Encounter
Admission: RE | Disposition: A | Discharge status: Home / Self Care | Payer: Self-pay | Source: Home / Self Care | Attending: Neurosurgery

## 2018-10-10 ENCOUNTER — Inpatient Hospital Stay: Payer: 59

## 2018-10-10 ENCOUNTER — Inpatient Hospital Stay: Payer: 59 | Admitting: Anesthesiology

## 2018-10-10 ENCOUNTER — Inpatient Hospital Stay
Admission: RE | Admit: 2018-10-10 | Discharge: 2018-10-11 | DRG: 030 | Disposition: A | Discharge status: Home / Self Care | Payer: 59 | Attending: Neurosurgery | Admitting: Neurosurgery

## 2018-10-10 ENCOUNTER — Encounter: Payer: Self-pay | Admitting: Anesthesiology

## 2018-10-10 ENCOUNTER — Other Ambulatory Visit: Payer: Self-pay

## 2018-10-10 DIAGNOSIS — K219 Gastro-esophageal reflux disease without esophagitis: Secondary | ICD-10-CM | POA: Diagnosis present

## 2018-10-10 DIAGNOSIS — I10 Essential (primary) hypertension: Secondary | ICD-10-CM | POA: Diagnosis not present

## 2018-10-10 DIAGNOSIS — M1990 Unspecified osteoarthritis, unspecified site: Secondary | ICD-10-CM | POA: Diagnosis not present

## 2018-10-10 DIAGNOSIS — G959 Disease of spinal cord, unspecified: Secondary | ICD-10-CM | POA: Diagnosis not present

## 2018-10-10 DIAGNOSIS — Z87891 Personal history of nicotine dependence: Secondary | ICD-10-CM

## 2018-10-10 DIAGNOSIS — M50021 Cervical disc disorder at C4-C5 level with myelopathy: Secondary | ICD-10-CM | POA: Diagnosis not present

## 2018-10-10 DIAGNOSIS — E669 Obesity, unspecified: Secondary | ICD-10-CM | POA: Diagnosis not present

## 2018-10-10 DIAGNOSIS — Z800 Family history of malignant neoplasm of digestive organs: Secondary | ICD-10-CM

## 2018-10-10 DIAGNOSIS — M4322 Fusion of spine, cervical region: Secondary | ICD-10-CM | POA: Diagnosis not present

## 2018-10-10 DIAGNOSIS — J441 Chronic obstructive pulmonary disease with (acute) exacerbation: Secondary | ICD-10-CM | POA: Diagnosis not present

## 2018-10-10 DIAGNOSIS — Z7984 Long term (current) use of oral hypoglycemic drugs: Secondary | ICD-10-CM

## 2018-10-10 DIAGNOSIS — Z79899 Other long term (current) drug therapy: Secondary | ICD-10-CM

## 2018-10-10 DIAGNOSIS — E1165 Type 2 diabetes mellitus with hyperglycemia: Secondary | ICD-10-CM | POA: Diagnosis not present

## 2018-10-10 DIAGNOSIS — Z981 Arthrodesis status: Secondary | ICD-10-CM | POA: Diagnosis not present

## 2018-10-10 DIAGNOSIS — Z833 Family history of diabetes mellitus: Secondary | ICD-10-CM

## 2018-10-10 DIAGNOSIS — Z79891 Long term (current) use of opiate analgesic: Secondary | ICD-10-CM

## 2018-10-10 DIAGNOSIS — Z6834 Body mass index (BMI) 34.0-34.9, adult: Secondary | ICD-10-CM

## 2018-10-10 DIAGNOSIS — E119 Type 2 diabetes mellitus without complications: Secondary | ICD-10-CM | POA: Diagnosis present

## 2018-10-10 DIAGNOSIS — Z419 Encounter for procedure for purposes other than remedying health state, unspecified: Secondary | ICD-10-CM

## 2018-10-10 DIAGNOSIS — Z8249 Family history of ischemic heart disease and other diseases of the circulatory system: Secondary | ICD-10-CM

## 2018-10-10 HISTORY — PX: ANTERIOR CERVICAL CORPECTOMY: SHX1159

## 2018-10-10 LAB — ABO/RH: ABO/RH(D): A POS

## 2018-10-10 LAB — HEMOGLOBIN A1C
Hgb A1c MFr Bld: 6.6 % — ABNORMAL HIGH (ref 4.8–5.6)
Mean Plasma Glucose: 142.72 mg/dL

## 2018-10-10 LAB — GLUCOSE, CAPILLARY
Glucose-Capillary: 130 mg/dL — ABNORMAL HIGH (ref 70–99)
Glucose-Capillary: 150 mg/dL — ABNORMAL HIGH (ref 70–99)
Glucose-Capillary: 246 mg/dL — ABNORMAL HIGH (ref 70–99)

## 2018-10-10 SURGERY — ANTERIOR CERVICAL CORPECTOMY
Anesthesia: General | Site: Neck

## 2018-10-10 MED ORDER — PROPOFOL 10 MG/ML IV BOLUS
INTRAVENOUS | Status: AC
  Filled 2018-10-10: qty 20

## 2018-10-10 MED ORDER — SODIUM CHLORIDE 0.9 % IV SOLN: INTRAVENOUS | Status: DC | PRN

## 2018-10-10 MED ORDER — SODIUM CHLORIDE 0.9% FLUSH: 3.0000 mL | INTRAVENOUS | Status: DC | PRN

## 2018-10-10 MED ORDER — SODIUM CHLORIDE 0.9 % IR SOLN
Status: DC | PRN
  Administered 2018-10-10: 13:00:00 1000 mL

## 2018-10-10 MED ORDER — ONDANSETRON HCL 4 MG/2ML IJ SOLN: 4.0000 mg | Freq: Four times a day (QID) | INTRAMUSCULAR | Status: DC | PRN

## 2018-10-10 MED ORDER — FENTANYL CITRATE (PF) 100 MCG/2ML IJ SOLN
INTRAMUSCULAR | Status: AC
  Administered 2018-10-10: 16:00:00 25 ug via INTRAVENOUS
  Filled 2018-10-10: qty 2

## 2018-10-10 MED ORDER — OXYCODONE HCL 5 MG PO TABS
5.0000 mg | ORAL_TABLET | ORAL | Status: DC | PRN
  Administered 2018-10-10 – 2018-10-11 (×3): 5 mg via ORAL
  Filled 2018-10-10 (×3): qty 1

## 2018-10-10 MED ORDER — PROPOFOL 500 MG/50ML IV EMUL
INTRAVENOUS | Status: DC | PRN
  Administered 2018-10-10: 120 ug/kg/min via INTRAVENOUS

## 2018-10-10 MED ORDER — DEXAMETHASONE SODIUM PHOSPHATE 10 MG/ML IJ SOLN
INTRAMUSCULAR | Status: AC
  Filled 2018-10-10: qty 1

## 2018-10-10 MED ORDER — METHOCARBAMOL 1000 MG/10ML IJ SOLN
500.0000 mg | Freq: Four times a day (QID) | INTRAVENOUS | Status: DC
  Administered 2018-10-10: 500 mg via INTRAVENOUS
  Filled 2018-10-10 (×8): qty 5

## 2018-10-10 MED ORDER — EMPAGLIFLOZIN 25 MG PO TABS: 25.0000 mg | ORAL_TABLET | Freq: Every day | ORAL | Status: DC

## 2018-10-10 MED ORDER — MIDAZOLAM HCL 2 MG/2ML IJ SOLN
INTRAMUSCULAR | Status: AC
  Filled 2018-10-10: qty 2

## 2018-10-10 MED ORDER — SODIUM CHLORIDE 0.9 % IV SOLN
INTRAVENOUS | Status: DC
  Administered 2018-10-10: 11:00:00 50 mL/h via INTRAVENOUS

## 2018-10-10 MED ORDER — LISINOPRIL 5 MG PO TABS
5.0000 mg | ORAL_TABLET | Freq: Every day | ORAL | Status: DC
  Filled 2018-10-10: qty 1

## 2018-10-10 MED ORDER — LIDOCAINE HCL (CARDIAC) PF 100 MG/5ML IV SOSY
PREFILLED_SYRINGE | INTRAVENOUS | Status: DC | PRN
  Administered 2018-10-10: 50 mg via INTRAVENOUS

## 2018-10-10 MED ORDER — GABAPENTIN 100 MG PO CAPS
100.0000 mg | ORAL_CAPSULE | Freq: Every day | ORAL | Status: DC
  Administered 2018-10-11: 100 mg via ORAL
  Filled 2018-10-10: qty 1

## 2018-10-10 MED ORDER — REMIFENTANIL HCL 1 MG IV SOLR
INTRAVENOUS | Status: AC
  Filled 2018-10-10: qty 1000

## 2018-10-10 MED ORDER — MAGNESIUM CITRATE PO SOLN
1.0000 | Freq: Once | ORAL | Status: DC | PRN
  Filled 2018-10-10: qty 296

## 2018-10-10 MED ORDER — GELATIN ABSORBABLE 12-7 MM EX MISC
CUTANEOUS | Status: AC
  Filled 2018-10-10: qty 1

## 2018-10-10 MED ORDER — CEFAZOLIN SODIUM-DEXTROSE 2-4 GM/100ML-% IV SOLN
2.0000 g | INTRAVENOUS | Status: AC
  Administered 2018-10-10: 13:00:00 2 g via INTRAVENOUS

## 2018-10-10 MED ORDER — SENNA 8.6 MG PO TABS
1.0000 | ORAL_TABLET | Freq: Two times a day (BID) | ORAL | Status: DC
  Administered 2018-10-10 – 2018-10-11 (×2): 8.6 mg via ORAL
  Filled 2018-10-10 (×2): qty 1

## 2018-10-10 MED ORDER — FENTANYL CITRATE (PF) 100 MCG/2ML IJ SOLN
25.0000 ug | INTRAMUSCULAR | Status: AC | PRN
  Administered 2018-10-10 (×6): 25 ug via INTRAVENOUS

## 2018-10-10 MED ORDER — MENTHOL 3 MG MT LOZG
1.0000 | LOZENGE | OROMUCOSAL | Status: DC | PRN
  Filled 2018-10-10: qty 9

## 2018-10-10 MED ORDER — THROMBIN 5000 UNITS EX SOLR
CUTANEOUS | Status: AC
  Filled 2018-10-10: qty 5000

## 2018-10-10 MED ORDER — REMIFENTANIL HCL 1 MG IV SOLR
INTRAVENOUS | Status: DC | PRN
  Administered 2018-10-10: .1 ug/kg/min via INTRAVENOUS

## 2018-10-10 MED ORDER — BUPIVACAINE-EPINEPHRINE (PF) 0.5% -1:200000 IJ SOLN
INTRAMUSCULAR | Status: AC
  Filled 2018-10-10: qty 30

## 2018-10-10 MED ORDER — FENTANYL CITRATE (PF) 100 MCG/2ML IJ SOLN
INTRAMUSCULAR | Status: AC
  Filled 2018-10-10: qty 2

## 2018-10-10 MED ORDER — FAMOTIDINE 20 MG PO TABS
ORAL_TABLET | ORAL | Status: AC
  Administered 2018-10-10: 11:00:00 20 mg via ORAL
  Filled 2018-10-10: qty 1

## 2018-10-10 MED ORDER — HYDROMORPHONE HCL 1 MG/ML IJ SOLN
0.5000 mg | INTRAMUSCULAR | Status: DC | PRN
  Administered 2018-10-10: 0.5 mg via INTRAVENOUS
  Filled 2018-10-10: qty 1

## 2018-10-10 MED ORDER — PHENYLEPHRINE HCL (PRESSORS) 10 MG/ML IV SOLN
INTRAVENOUS | Status: AC
  Filled 2018-10-10: qty 1

## 2018-10-10 MED ORDER — PROPOFOL 500 MG/50ML IV EMUL
INTRAVENOUS | Status: AC
  Filled 2018-10-10: qty 100

## 2018-10-10 MED ORDER — ACETAMINOPHEN 500 MG PO TABS
1000.0000 mg | ORAL_TABLET | Freq: Four times a day (QID) | ORAL | Status: AC
  Administered 2018-10-10 – 2018-10-11 (×3): 1000 mg via ORAL
  Filled 2018-10-10 (×3): qty 2

## 2018-10-10 MED ORDER — LIDOCAINE HCL (PF) 2 % IJ SOLN
INTRAMUSCULAR | Status: AC
  Filled 2018-10-10: qty 10

## 2018-10-10 MED ORDER — KETAMINE HCL 50 MG/ML IJ SOLN
INTRAMUSCULAR | Status: DC | PRN
  Administered 2018-10-10: 50 mg via INTRAMUSCULAR
  Administered 2018-10-10: 50 mg via INTRAVENOUS

## 2018-10-10 MED ORDER — PHENYLEPHRINE HCL (PRESSORS) 10 MG/ML IV SOLN
INTRAVENOUS | Status: DC | PRN
  Administered 2018-10-10 (×2): 100 ug via INTRAVENOUS

## 2018-10-10 MED ORDER — BACITRACIN 50000 UNITS IM SOLR
INTRAMUSCULAR | Status: AC
  Filled 2018-10-10: qty 1

## 2018-10-10 MED ORDER — MORPHINE SULFATE (PF) 4 MG/ML IV SOLN
INTRAVENOUS | Status: AC
  Administered 2018-10-10: 17:00:00 2 mg via INTRAVENOUS
  Filled 2018-10-10: qty 1

## 2018-10-10 MED ORDER — HYDROMORPHONE HCL 1 MG/ML IJ SOLN
INTRAMUSCULAR | Status: AC
  Filled 2018-10-10: qty 1

## 2018-10-10 MED ORDER — DEXAMETHASONE SODIUM PHOSPHATE 10 MG/ML IJ SOLN
INTRAMUSCULAR | Status: DC | PRN
  Administered 2018-10-10: 10 mg via INTRAVENOUS

## 2018-10-10 MED ORDER — SUCCINYLCHOLINE CHLORIDE 20 MG/ML IJ SOLN
INTRAMUSCULAR | Status: DC | PRN
  Administered 2018-10-10: 100 mg via INTRAVENOUS

## 2018-10-10 MED ORDER — POLYETHYLENE GLYCOL 3350 17 G PO PACK: 17.0000 g | PACK | Freq: Every day | ORAL | Status: DC | PRN

## 2018-10-10 MED ORDER — MORPHINE SULFATE (PF) 4 MG/ML IV SOLN
2.0000 mg | INTRAVENOUS | Status: DC | PRN
  Administered 2018-10-10 (×4): 2 mg via INTRAVENOUS

## 2018-10-10 MED ORDER — GLYCOPYRROLATE 0.2 MG/ML IJ SOLN
INTRAMUSCULAR | Status: DC | PRN
  Administered 2018-10-10: 0.2 mg via INTRAVENOUS

## 2018-10-10 MED ORDER — MORPHINE SULFATE (PF) 4 MG/ML IV SOLN
2.0000 mg | Freq: Once | INTRAVENOUS | Status: AC
  Administered 2018-10-10: 18:00:00 2 mg via INTRAVENOUS

## 2018-10-10 MED ORDER — MIDAZOLAM HCL 2 MG/2ML IJ SOLN
INTRAMUSCULAR | Status: DC | PRN
  Administered 2018-10-10: 2 mg via INTRAVENOUS

## 2018-10-10 MED ORDER — KETOROLAC TROMETHAMINE 30 MG/ML IJ SOLN
INTRAMUSCULAR | Status: AC
  Administered 2018-10-10: 30 mg via INTRAVENOUS
  Filled 2018-10-10: qty 1

## 2018-10-10 MED ORDER — KETOROLAC TROMETHAMINE 30 MG/ML IJ SOLN
30.0000 mg | Freq: Once | INTRAMUSCULAR | Status: AC
  Administered 2018-10-10: 18:00:00 30 mg via INTRAVENOUS

## 2018-10-10 MED ORDER — METHOCARBAMOL 500 MG PO TABS
500.0000 mg | ORAL_TABLET | Freq: Four times a day (QID) | ORAL | Status: DC
  Administered 2018-10-10 – 2018-10-11 (×3): 500 mg via ORAL
  Filled 2018-10-10 (×4): qty 1

## 2018-10-10 MED ORDER — SODIUM CHLORIDE FLUSH 0.9 % IV SOLN
INTRAVENOUS | Status: AC
  Filled 2018-10-10: qty 10

## 2018-10-10 MED ORDER — SODIUM CHLORIDE 0.9% FLUSH: 3.0000 mL | Freq: Two times a day (BID) | INTRAVENOUS | Status: DC

## 2018-10-10 MED ORDER — SODIUM CHLORIDE 0.9 % IV SOLN
INTRAVENOUS | Status: DC | PRN
  Administered 2018-10-10: 20 ug/min via INTRAVENOUS

## 2018-10-10 MED ORDER — FOLIC ACID 1 MG PO TABS
1.0000 mg | ORAL_TABLET | Freq: Every day | ORAL | Status: DC
  Administered 2018-10-11: 1 mg via ORAL
  Filled 2018-10-10: qty 1

## 2018-10-10 MED ORDER — METFORMIN HCL ER 500 MG PO TB24
1000.0000 mg | ORAL_TABLET | Freq: Two times a day (BID) | ORAL | Status: DC
  Administered 2018-10-11: 08:00:00 1000 mg via ORAL
  Filled 2018-10-10 (×2): qty 2

## 2018-10-10 MED ORDER — BISACODYL 5 MG PO TBEC: 5.0000 mg | DELAYED_RELEASE_TABLET | Freq: Every day | ORAL | Status: DC | PRN

## 2018-10-10 MED ORDER — ROCURONIUM BROMIDE 100 MG/10ML IV SOLN
INTRAVENOUS | Status: DC | PRN
  Administered 2018-10-10: 5 mg via INTRAVENOUS

## 2018-10-10 MED ORDER — FAMOTIDINE 20 MG PO TABS
20.0000 mg | ORAL_TABLET | Freq: Once | ORAL | Status: AC
  Administered 2018-10-10: 11:00:00 20 mg via ORAL

## 2018-10-10 MED ORDER — THROMBIN 5000 UNITS EX SOLR
CUTANEOUS | Status: DC | PRN
  Administered 2018-10-10: 5000 [IU] via TOPICAL

## 2018-10-10 MED ORDER — SODIUM CHLORIDE (PF) 0.9 % IJ SOLN
INTRAMUSCULAR | Status: AC
  Filled 2018-10-10: qty 20

## 2018-10-10 MED ORDER — ONDANSETRON HCL 4 MG/2ML IJ SOLN
INTRAMUSCULAR | Status: AC
  Filled 2018-10-10: qty 2

## 2018-10-10 MED ORDER — FENTANYL CITRATE (PF) 100 MCG/2ML IJ SOLN
INTRAMUSCULAR | Status: DC | PRN
  Administered 2018-10-10: 100 ug via INTRAVENOUS

## 2018-10-10 MED ORDER — CEFAZOLIN SODIUM 1 G IJ SOLR
INTRAMUSCULAR | Status: AC
  Filled 2018-10-10: qty 20

## 2018-10-10 MED ORDER — ONDANSETRON HCL 4 MG PO TABS: 4.0000 mg | ORAL_TABLET | Freq: Four times a day (QID) | ORAL | Status: DC | PRN

## 2018-10-10 MED ORDER — SODIUM CHLORIDE 0.9 % IV SOLN: 250.0000 mL | INTRAVENOUS | Status: DC

## 2018-10-10 MED ORDER — GLYCOPYRROLATE 0.2 MG/ML IJ SOLN
INTRAMUSCULAR | Status: AC
  Filled 2018-10-10: qty 1

## 2018-10-10 MED ORDER — ACETAMINOPHEN 650 MG RE SUPP: 650.0000 mg | RECTAL | Status: DC | PRN

## 2018-10-10 MED ORDER — OXYCODONE HCL 5 MG PO TABS
10.0000 mg | ORAL_TABLET | ORAL | Status: DC | PRN
  Administered 2018-10-10 – 2018-10-11 (×2): 10 mg via ORAL
  Filled 2018-10-10 (×2): qty 2

## 2018-10-10 MED ORDER — PROPOFOL 500 MG/50ML IV EMUL
INTRAVENOUS | Status: AC
  Filled 2018-10-10: qty 50

## 2018-10-10 MED ORDER — FENTANYL CITRATE (PF) 100 MCG/2ML IJ SOLN
25.0000 ug | INTRAMUSCULAR | Status: AC | PRN
  Administered 2018-10-10 (×2): 25 ug via INTRAVENOUS

## 2018-10-10 MED ORDER — PHENOL 1.4 % MT LIQD
1.0000 | OROMUCOSAL | Status: DC | PRN
  Administered 2018-10-11: 16:00:00 1 via OROMUCOSAL
  Filled 2018-10-10 (×2): qty 177

## 2018-10-10 MED ORDER — CEFAZOLIN SODIUM-DEXTROSE 2-4 GM/100ML-% IV SOLN
INTRAVENOUS | Status: AC
  Filled 2018-10-10: qty 100

## 2018-10-10 MED ORDER — ACETAMINOPHEN 325 MG PO TABS: 650.0000 mg | ORAL_TABLET | ORAL | Status: DC | PRN

## 2018-10-10 MED ORDER — ONDANSETRON HCL 4 MG/2ML IJ SOLN
INTRAMUSCULAR | Status: DC | PRN
  Administered 2018-10-10: 4 mg via INTRAVENOUS

## 2018-10-10 MED ORDER — MORPHINE SULFATE (PF) 4 MG/ML IV SOLN
INTRAVENOUS | Status: AC
  Administered 2018-10-10: 18:00:00 2 mg via INTRAVENOUS
  Filled 2018-10-10: qty 1

## 2018-10-10 MED ORDER — SODIUM CHLORIDE 0.9 % IV SOLN
INTRAVENOUS | Status: DC
  Administered 2018-10-10: 19:00:00 via INTRAVENOUS

## 2018-10-10 MED ORDER — LACTATED RINGERS IV SOLN
INTRAVENOUS | Status: DC | PRN
  Administered 2018-10-10: 12:00:00 via INTRAVENOUS

## 2018-10-10 MED ORDER — ONDANSETRON HCL 4 MG/2ML IJ SOLN: 4.0000 mg | Freq: Once | INTRAMUSCULAR | Status: DC | PRN

## 2018-10-10 MED ORDER — INSULIN ASPART 100 UNIT/ML ~~LOC~~ SOLN
0.0000 [IU] | Freq: Three times a day (TID) | SUBCUTANEOUS | Status: DC
  Administered 2018-10-11: 2 [IU] via SUBCUTANEOUS
  Administered 2018-10-11: 09:00:00 5 [IU] via SUBCUTANEOUS
  Filled 2018-10-10 (×2): qty 1

## 2018-10-10 MED ORDER — BUPIVACAINE-EPINEPHRINE 0.5% -1:200000 IJ SOLN
INTRAMUSCULAR | Status: DC | PRN
  Administered 2018-10-10: 5 mL

## 2018-10-10 MED ORDER — PROPOFOL 10 MG/ML IV BOLUS
INTRAVENOUS | Status: DC | PRN
  Administered 2018-10-10: 30 mg via INTRAVENOUS
  Administered 2018-10-10: 100 mg via INTRAVENOUS
  Administered 2018-10-10: 200 mg via INTRAVENOUS

## 2018-10-10 MED ORDER — DEXMEDETOMIDINE HCL IN NACL 200 MCG/50ML IV SOLN
INTRAVENOUS | Status: DC | PRN
  Administered 2018-10-10: 8 ug via INTRAVENOUS
  Administered 2018-10-10: 20 ug via INTRAVENOUS

## 2018-10-10 SURGICAL SUPPLY — 73 items
ADH SKN CLS APL DERMABOND .7 (GAUZE/BANDAGES/DRESSINGS) ×1
AGENT HMST MTR 8 SURGIFLO (HEMOSTASIS) ×1
APL PRP STRL LF DISP 70% ISPRP (MISCELLANEOUS) ×1
BAND RUBBER 3X1/6 STRL (MISCELLANEOUS) ×1 IMPLANT
BAND RUBBER 3X1/6 TAN STRL (MISCELLANEOUS) ×3 IMPLANT
BLADE BOVIE TIP EXT 4 (BLADE) ×3 IMPLANT
BLADE SURG 15 STRL LF DISP TIS (BLADE) ×1 IMPLANT
BLADE SURG 15 STRL SS (BLADE) ×3
BULB RESERV EVAC DRAIN JP 100C (MISCELLANEOUS) ×2 IMPLANT
BUR NEURO DRILL SOFT 3.0X3.8M (BURR) ×3 IMPLANT
CANISTER SUCT 1200ML W/VALVE (MISCELLANEOUS) ×6 IMPLANT
CHLORAPREP W/TINT 26 (MISCELLANEOUS) ×3 IMPLANT
CLOSURE WOUND 1/2 X4 (GAUZE/BANDAGES/DRESSINGS)
COUNTER NEEDLE 20/40 LG (NEEDLE) ×3 IMPLANT
COVER BACK TABLE REUSABLE LG (DRAPES) ×3 IMPLANT
COVER LIGHT HANDLE STERIS (MISCELLANEOUS) ×6 IMPLANT
COVER WAND RF STERILE (DRAPES) ×3 IMPLANT
CRADLE LAMINECT ARM (MISCELLANEOUS) ×3 IMPLANT
CUP MEDICINE 2OZ PLAST GRAD ST (MISCELLANEOUS) ×6 IMPLANT
DERMABOND ADVANCED (GAUZE/BANDAGES/DRESSINGS) ×2
DERMABOND ADVANCED .7 DNX12 (GAUZE/BANDAGES/DRESSINGS) ×1 IMPLANT
DRAIN CHANNEL JP 10F RND 20C F (MISCELLANEOUS) ×2 IMPLANT
DRAPE 3/4 80X56 (DRAPES) ×3 IMPLANT
DRAPE C-ARM 42X72 X-RAY (DRAPES) ×6 IMPLANT
DRAPE INCISE IOBAN 66X45 STRL (DRAPES) ×3 IMPLANT
DRAPE LAPAROTOMY 77X122 PED (DRAPES) ×3 IMPLANT
DRAPE MICROSCOPE SPINE 48X150 (DRAPES) ×3 IMPLANT
DRAPE POUCH INSTRU U-SHP 10X18 (DRAPES) ×3 IMPLANT
DRAPE SURG 17X11 SM STRL (DRAPES) ×8 IMPLANT
ELECT CAUTERY BLADE TIP 2.5 (TIP) ×3
ELECTRODE CAUTERY BLDE TIP 2.5 (TIP) ×1 IMPLANT
FEE INTRAOP MONITOR IMPULS NCS (MISCELLANEOUS) IMPLANT
FRAME EYE SHIELD (PROTECTIVE WEAR) ×3 IMPLANT
GAUZE SPONGE 4X4 12PLY STRL (GAUZE/BANDAGES/DRESSINGS) ×3 IMPLANT
GLOVE SURG SYN 7.0 (GLOVE) ×6 IMPLANT
GLOVE SURG SYN 7.0 PF PI (GLOVE) ×2 IMPLANT
GLOVE SURG SYN 8.5  E (GLOVE) ×6
GLOVE SURG SYN 8.5 E (GLOVE) ×3 IMPLANT
GLOVE SURG SYN 8.5 PF PI (GLOVE) ×3 IMPLANT
GOWN SRG XL LVL 3 NONREINFORCE (GOWNS) ×1 IMPLANT
GOWN STRL NON-REIN TWL XL LVL3 (GOWNS) ×3
GOWN STRL REUS W/ TWL LRG LVL3 (GOWN DISPOSABLE) ×1 IMPLANT
GOWN STRL REUS W/TWL LRG LVL3 (GOWN DISPOSABLE) ×3
GRADUATE 1200CC STRL 31836 (MISCELLANEOUS) ×3 IMPLANT
INTRAOP MONITOR FEE IMPULS NCS (MISCELLANEOUS) ×1
INTRAOP MONITOR FEE IMPULSE (MISCELLANEOUS) ×2
IV CATH ANGIO 12GX3 LT BLUE (NEEDLE) ×3 IMPLANT
KIT TURNOVER KIT A (KITS) ×3 IMPLANT
MARKER SKIN DUAL TIP RULER LAB (MISCELLANEOUS) ×6 IMPLANT
NEEDLE HYPO 22GX1.5 SAFETY (NEEDLE) ×3 IMPLANT
NS IRRIG 1000ML POUR BTL (IV SOLUTION) ×3 IMPLANT
PACK LAMINECTOMY NEURO (CUSTOM PROCEDURE TRAY) ×3 IMPLANT
PIN CASPAR 14 (PIN) ×1 IMPLANT
PIN CASPAR 14MM (PIN) ×3
PLATE ANT CERV XTEND 2 LV 36 (Plate) ×2 IMPLANT
SCREW VAR 4.2 XD SELF DRILL 16 (Screw) ×8 IMPLANT
SLEEVE SCD COMPRESS THIGH MED (MISCELLANEOUS) ×2 IMPLANT
SPACER CERV TI 12X14X12 (Spacer) ×2 IMPLANT
SPACER CERV TI 19-25 12 CORE (Spacer) ×2 IMPLANT
SPACER UPPER FORTIFY 12X14 3.5 (Plate) ×2 IMPLANT
SPOGE SURGIFLO 8M (HEMOSTASIS) ×2
SPONGE KITTNER 5P (MISCELLANEOUS) ×4 IMPLANT
SPONGE SURGIFLO 8M (HEMOSTASIS) ×1 IMPLANT
STAPLER SKIN PROX 35W (STAPLE) IMPLANT
STRIP CLOSURE SKIN 1/2X4 (GAUZE/BANDAGES/DRESSINGS) IMPLANT
SUT V-LOC 90 ABS DVC 3-0 CL (SUTURE) ×3 IMPLANT
SUT VIC AB 3-0 SH 8-18 (SUTURE) ×3 IMPLANT
SYR 30ML LL (SYRINGE) ×3 IMPLANT
TAPE CLOTH 3X10 WHT NS LF (GAUZE/BANDAGES/DRESSINGS) ×3 IMPLANT
TOWEL OR 17X26 4PK STRL BLUE (TOWEL DISPOSABLE) ×12 IMPLANT
TRAY FOLEY MTR SLVR 16FR STAT (SET/KITS/TRAYS/PACK) IMPLANT
TUBING CONNECTING 10 (TUBING) ×2 IMPLANT
TUBING CONNECTING 10' (TUBING) ×1

## 2018-10-10 NOTE — Anesthesia Postprocedure Evaluation (Signed)
Anesthesia Post Note  Patient: DERRIOUS RENNELS  Procedure(s) Performed: ANTERIOR CERVICAL CORPECTOMY C4, C3-5 DISCECTOMY AND INSTRUMENTATION (N/A Neck)  Patient location during evaluation: PACU Anesthesia Type: General Level of consciousness: awake and alert Pain management: pain level controlled Vital Signs Assessment: post-procedure vital signs reviewed and stable Respiratory status: spontaneous breathing, nonlabored ventilation, respiratory function stable and patient connected to nasal cannula oxygen Cardiovascular status: blood pressure returned to baseline and stable Postop Assessment: no apparent nausea or vomiting Anesthetic complications: no     Last Vitals:  Vitals:   10/10/18 1800 10/10/18 1822  BP:  140/89  Pulse: 87 90  Resp: 16 18  Temp:  36.7 C  SpO2: 95% 95%    Last Pain:  Vitals:   10/10/18 1715  TempSrc:   PainSc: 6                  Martha Clan

## 2018-10-10 NOTE — OR Nursing (Signed)
Numbness in right arm is the same as before surgery.  Left arm numbness is less.
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
## 2018-10-10 NOTE — OR Nursing (Addendum)
Patient states "pain is still an 8".  Appears more comfortable in appearance. Dr. Gerlene Fee OK'ed 2mg . Morphine and Estill Bamberg OK'ed 30 mg. of Toradol.  Morphine given at 1730.  Toradol given at 1740.

## 2018-10-10 NOTE — OR Nursing (Signed)
Explanted 1 plate and 4 screws from neck

## 2018-10-10 NOTE — Progress Notes (Signed)
Procedure: C4 corpectomy, C3-C5 discectomy Procedure date: 10/10/2018 Diagnosis: Cervical myelopathy  History: Tony Cardenas is s/p C4 corpectomy for cervical myelopathy.  POD0: A procedure well without complication.  Seen in postop recovery still disoriented from anesthesia but able to answer questions and obey commands.  Complains of 10/10 anterior and posterior neck pain.  Complains of continued numbness, most prominent in right upper extremity.  Unable determine if upper extremity pain has resolved at this point.  Swallowing without issue.  He was able to void postoperatively.  Physical Exam: Vitals:   10/10/18 1610 10/10/18 1615  BP:  125/83  Pulse: 90 88  Resp:  11  Temp:    SpO2: 96% 96%   Strength:5/5 throughout lower extremities, at least 4+ through right upper extremity, 5/5 left upper extremity. Sensation: Intact throughout lower extremities bilaterally, decreased sensation right forearm Skin: Glue intact at incision site.  Dressing clean and dry at drain insertion point.  No active bleeding noted.  Data:  Recent Labs  Lab 10/05/18 1300  NA 140  K 4.0  CL 107  CO2 24  BUN 9  CREATININE 0.57*  GLUCOSE 103*  CALCIUM 8.9   No results for input(s): AST, ALT, ALKPHOS in the last 168 hours.  Invalid input(s): TBILI   Recent Labs  Lab 10/05/18 1300  WBC 12.4*  HGB 15.3  HCT 43.5  PLT 411*   Recent Labs  Lab 10/05/18 1300  APTT 42*  INR 1.1         Other tests/results: Cervical x-rays pending  Assessment/Plan:  Tony Cardenas is POD 0 status post C4 corpectomy for cervical myelopathy.  We will continue to monitor.  - monitor drain output - mobilize - pain control - DVT prophylaxis - PTOT  Marin Olp PA-C Department of Neurosurgery

## 2018-10-10 NOTE — Anesthesia Post-op Follow-up Note (Signed)
Anesthesia QCDR form completed.        

## 2018-10-10 NOTE — H&P (Signed)
I have reviewed and confirmed my history and physical from 10/04/2018 with no additions or changes. Plan for C4 corpectomy.  Risks and benefits reviewed.  Heart sounds normal no MRG. Chest Clear to Auscultation Bilaterally.

## 2018-10-10 NOTE — Op Note (Signed)
Indications: Mr. Townsend is a 48 yo male who presented with cervical myelopathy.  He had worsening symptoms including worsening weakness and balance, so was recommended to have surgical intervention.  Findings: severe stenosis.  Preoperative Diagnosis: Cervical myelopathy Postoperative Diagnosis: same   EBL: 150 ml IVF: 900 ml Drains: 1 placed Disposition: Extubated and Stable to PACU Complications: none  No foley catheter was placed.   Preoperative Note:   Risks of surgery discussed include: infection, bleeding, stroke, coma, death, paralysis, CSF leak, nerve/spinal cord injury, numbness, tingling, weakness, complex regional pain syndrome, recurrent stenosis and/or disc herniation, vascular injury, development of instability, neck/back pain, need for further surgery, persistent symptoms, development of deformity, and the risks of anesthesia. The patient understood these risks and agreed to proceed.  Operative Note:  PROCEDURES: 1. Anterior cervical corpectomy of C4, rmoving greater than 50% of the vertebral body at that level. 2. Anterior Arthrodesis from C3 to C5 3. Anterior cervical spine instrumentation C3 -C5 using Globus Xtend 4. Insertion of biomechanic device (Globus Fortify) as a vertebral body replacement 5. Harvesting of autograft via the same incision 6. Use of the operative microscope for aid in microdissection  PROCEDURE IN DETAIL: After obtaining informed consent, the patient taken to the operating room, placed in supine position, general anesthesia induced.  The patient had a small shoulder roll placed behind their shoulders.  After a timeout, the patient received preop antibiotics and 10 mg of IV Decadron.  The patient had a neck incision outlined, was prepped and draped in usual sterile fashion. The incision was injected with local anesthetic.    An incision was opened, dissection taken down medial to the carotid artery and jugular vein, lateral to the trachea and  esophagus.  The prevertebral fascia identified and a localizing x-ray demonstrated the correct level.  The prior plate was then uncovered. The plate was extensively scarred in, so was carefully freed until the entire plate was visible at C5/6.  The prior plate was then removed.  We then moved to the superior level.  The soft tissues and longus colli were dissected laterally from the vertebral bodies from C3 to C5 using monopolar electrocautery. Blackbelt retractors were then placed. The microscope was brought into the field for aid in microdissection.   With this complete, distractor pins were placed in the vertebral bodies of C3 and C5. The distractor was placed and the disc spaces gently distracted. The annulus at C3/4 was opened using a Bovie. Using curettes and a pituitary rongeur, the disc was removed from the endplate. Using a nerve hook, the posterior longitudinal ligament was elevated and divided. Using curettes and a 78mm Kerrison punch, the posterior longitudinal ligament was removed and the spinal cord and neuroforamina decompressed. The nerve hook was used to confirm decompression.   Then, annulus at C4/5 was opened using a Bovie, cutting away from the esophagus. Using curettes and a pituitary rongeur, the disc was removed from the endplate. Using a nerve hook, the posterior longitudinal ligament was elevated and divided. Using curettes and a 19mm Kerrison punch, the posterior longitudinal ligament was removed and the spinal cord and neuroforamina decompressed. The nerve hook was used to confirm decompression.   After disc preparation, a portion of the C4 vertebral body was removed with a Leksell rongeur and saved for used as autograft. The high speed drill was then used to finish the corpectomy. At least 70% of the vertebral body was removed in total. The site was measured to confirm adequate width. The  posterior longitudinal ligament was then elevated from the inferior disc space and divided  superiorly until the vertebral body wall was removed.  The nerve hook was then used to confirm decompression of the spinal cord. The endplates were gently decorticated for arthrodesis preparation.  After decompression, the corpectomy defect was measured and a vertebral body replacement was chosen. It was filled with autograft, and then placed used flouroscopic guidance. After this was placed, a size 36 mm Globus Xtend plate was sized using flouroscopy and then placed. Two screws placed in the vertebral bodies at C3 and C5,  making sure the screws were locked.  Final radiographs were taken to confirm placement.  With everything in good position, the wound was irrigated copiously with bacitracin-containing solution and meticulous hemostasis obtained.  A drain was placed.  Wound was closed in 2 layers using interrupted inverted 3-0 Vicryl sutures.  The wound was dressed with dermabond, the head of bed at 30 degrees, taken to recovery room in stable condition.  No immediate complications were noted.  Electrophysiologic monitoring was stable throughout.  I performed the entire procedure with Marin Olp PA as an Environmental consultant.  Meade Maw MD

## 2018-10-10 NOTE — Consult Note (Signed)
Pharmacy Antibiotic Note  Tony Cardenas is a 48 y.o. male admitted on 10/10/18 for a planned surgical procedure.  Pharmacy has been consulted for Cefazolin Pre-op dosing.  Plan: Cefazolin 2g IV x 1 dose 30-min pre-op has been ordered    No data recorded.  Recent Labs  Lab 10/05/18 1300  WBC 12.4*  CREATININE 0.57*    Estimated Creatinine Clearance: 139 mL/min (A) (by C-G formula based on SCr of 0.57 mg/dL (L)).    Allergies  Allergen Reactions  . Atorvastatin Other (See Comments)    A lot of cramping and joint pain  . Hydromorphone Hcl Nausea And Vomiting    Thank you for allowing pharmacy to be a part of this patient's care.  Pernell Dupre, PharmD, BCPS Clinical Pharmacist 10/10/2018 10:16 AM

## 2018-10-10 NOTE — Anesthesia Procedure Notes (Signed)
Procedure Name: Intubation Performed by: Sheneka Schrom, CRNA Pre-anesthesia Checklist: Patient identified, Patient being monitored, Timeout performed, Emergency Drugs available and Suction available Patient Re-evaluated:Patient Re-evaluated prior to induction Oxygen Delivery Method: Circle system utilized Preoxygenation: Pre-oxygenation with 100% oxygen Induction Type: IV induction Ventilation: Mask ventilation without difficulty Laryngoscope Size: McGraph and 4 Grade View: Grade I Tube type: Oral Tube size: 7.5 mm Number of attempts: 1 Airway Equipment and Method: Stylet and Video-laryngoscopy Placement Confirmation: ETT inserted through vocal cords under direct vision,  positive ETCO2 and breath sounds checked- equal and bilateral Secured at: 22 cm Tube secured with: Tape Dental Injury: Teeth and Oropharynx as per pre-operative assessment        

## 2018-10-10 NOTE — Transfer of Care (Signed)
Immediate Anesthesia Transfer of Care Note  Patient: Tony Cardenas  Procedure(s) Performed: ANTERIOR CERVICAL CORPECTOMY C4, C3-5 DISCECTOMY AND INSTRUMENTATION (N/A Neck)  Patient Location: PACU  Anesthesia Type:General  Level of Consciousness: awake, alert  and oriented  Airway & Oxygen Therapy: Patient Spontanous Breathing and Patient connected to nasal cannula oxygen  Post-op Assessment: Report given to RN and Post -op Vital signs reviewed and stable  Post vital signs: Reviewed and stable  Last Vitals:  Vitals Value Taken Time  BP 131/83 10/10/18 1605  Temp 36.2 C 10/10/18 1605  Pulse 87 10/10/18 1605  Resp 20 10/10/18 1605  SpO2 96 % 10/10/18 1605    Last Pain:  Vitals:   10/10/18 1605  TempSrc: Temporal  PainSc:          Complications: No apparent anesthesia complications

## 2018-10-11 DIAGNOSIS — G959 Disease of spinal cord, unspecified: Secondary | ICD-10-CM | POA: Diagnosis present

## 2018-10-11 DIAGNOSIS — Z79899 Other long term (current) drug therapy: Secondary | ICD-10-CM | POA: Diagnosis not present

## 2018-10-11 DIAGNOSIS — K219 Gastro-esophageal reflux disease without esophagitis: Secondary | ICD-10-CM | POA: Diagnosis present

## 2018-10-11 DIAGNOSIS — M1990 Unspecified osteoarthritis, unspecified site: Secondary | ICD-10-CM | POA: Diagnosis present

## 2018-10-11 DIAGNOSIS — Z981 Arthrodesis status: Secondary | ICD-10-CM | POA: Diagnosis not present

## 2018-10-11 DIAGNOSIS — Z8249 Family history of ischemic heart disease and other diseases of the circulatory system: Secondary | ICD-10-CM | POA: Diagnosis not present

## 2018-10-11 DIAGNOSIS — E119 Type 2 diabetes mellitus without complications: Secondary | ICD-10-CM | POA: Diagnosis present

## 2018-10-11 DIAGNOSIS — Z7984 Long term (current) use of oral hypoglycemic drugs: Secondary | ICD-10-CM | POA: Diagnosis not present

## 2018-10-11 DIAGNOSIS — Z6834 Body mass index (BMI) 34.0-34.9, adult: Secondary | ICD-10-CM | POA: Diagnosis not present

## 2018-10-11 DIAGNOSIS — Z79891 Long term (current) use of opiate analgesic: Secondary | ICD-10-CM | POA: Diagnosis not present

## 2018-10-11 DIAGNOSIS — I10 Essential (primary) hypertension: Secondary | ICD-10-CM | POA: Diagnosis present

## 2018-10-11 DIAGNOSIS — E669 Obesity, unspecified: Secondary | ICD-10-CM | POA: Diagnosis present

## 2018-10-11 DIAGNOSIS — Z87891 Personal history of nicotine dependence: Secondary | ICD-10-CM | POA: Diagnosis not present

## 2018-10-11 DIAGNOSIS — Z800 Family history of malignant neoplasm of digestive organs: Secondary | ICD-10-CM | POA: Diagnosis not present

## 2018-10-11 DIAGNOSIS — Z833 Family history of diabetes mellitus: Secondary | ICD-10-CM | POA: Diagnosis not present

## 2018-10-11 LAB — GLUCOSE, CAPILLARY
Glucose-Capillary: 214 mg/dL — ABNORMAL HIGH (ref 70–99)
Glucose-Capillary: 138 mg/dL — ABNORMAL HIGH (ref 70–99)

## 2018-10-11 MED ORDER — METHOCARBAMOL 500 MG PO TABS: 500.0000 mg | ORAL_TABLET | Freq: Four times a day (QID) | ORAL | 0 refills | Status: DC

## 2018-10-11 MED ORDER — OXYCODONE HCL 5 MG PO TABS: 5.0000 mg | ORAL_TABLET | ORAL | 0 refills | Status: DC | PRN

## 2018-10-11 NOTE — Progress Notes (Signed)
Pt. Discharged to home via private vehicle. Discharge instructions and medication regimen reviewed at bedside with patient. Pt. verbalizes understanding of instructions and medication regimen. Prescriptions with pt. Patient assessment unchanged from this morning. IV discontinued per policy.  

## 2018-10-11 NOTE — Progress Notes (Signed)
[employer] called for [employer]

## 2018-10-11 NOTE — Progress Notes (Signed)
Procedure: C4 corpectomy, C3-C5 discectomy Procedure date: 10/10/2018 Diagnosis: Cervical myelopathy  History: Tony Cardenas is s/p C4 corpectomy for cervical myelopathy.  POD1: Recovering well. Eating breakfast this morning with no complaints of swallowing. Pain has improved since yesterday. Continues to experience decreased sensation in upper extremities but feels neck pain/stiffness and strength have already improved. Denies new pain/numbness/tingling in upper or lower extremities.  Drain output 80 Voiding without issue. He has not been out of bed yet.   POD0: A procedure well without complication.  Seen in postop recovery still disoriented from anesthesia but able to answer questions and obey commands.  Complains of 10/10 anterior and posterior neck pain.  Complains of continued numbness, most prominent in right upper extremity.  Unable determine if upper extremity pain has resolved at this point.  Swallowing without issue.  He was able to void postoperatively.  Physical Exam: Vitals:   10/11/18 0348 10/11/18 0741  BP: 127/72 114/71  Pulse: 85 75  Resp: 18 16  Temp: 98.6 F (37 C) 98 F (36.7 C)  SpO2: 95% 96%   Strength:5/5 throughout upper and lower extremities. Sensation: Decreased sensation diffusely through upper extremities.  Skin: Glue intact at incision site.  Dressing clean and dry at drain insertion point.  No active bleeding noted.  Data:  Recent Labs  Lab 10/05/18 1300  NA 140  K 4.0  CL 107  CO2 24  BUN 9  CREATININE 0.57*  GLUCOSE 103*  CALCIUM 8.9   No results for input(s): AST, ALT, ALKPHOS in the last 168 hours.  Invalid input(s): TBILI   Recent Labs  Lab 10/05/18 1300  WBC 12.4*  HGB 15.3  HCT 43.5  PLT 411*   Recent Labs  Lab 10/05/18 1300  APTT 42*  INR 1.1         Other tests/results:  EXAM: CERVICAL SPINE - 2-3 VIEW  10/11/2018  COMPARISON:  Radiographs 05/30/2017  FINDINGS: Remote posterior wire fusion at C1-2. The  more anterior wires are fractured. Prior C5-6 anterior plate and screws demonstrate removal with subsequent corpectomy interbody fusion device and anterior plate and screws from C3 to C5.  IMPRESSION: Corpectomy with anterior interbody fusion changes from C3-C5. Assessment/Plan:  Tony Cardenas is POD 1 status post C4 corpectomy for cervical myelopathy.  We will continue to monitor.  - monitor drain output - mobilize - pain control - DVT prophylaxis - PTOT  Marin Olp PA-C Department of Neurosurgery

## 2018-10-11 NOTE — Evaluation (Signed)
Occupational Therapy Evaluation Patient Details Name: Tony Cardenas MRN: [id number] DOB: Jun 21, 1970 Today's Date: 10/11/2018    History of Present Illness 48 y/o male s/p C4 corpectomy, C3-C5 discectomy 9/30.     Clinical Impression   Tony Cardenas was seen for OT evaluation this date, POD#1 from above proceedure. Prior to hospital admission, pt was independent with mobility, ADL, and IADL. No falls in past 12 months. Pt lives with spouse in a single family home with a level entrance. Pt reports his spouse and other family members are able to provide 24/7 assistance and support with ADL mgt. Currently pt is supervision level with all aspects of mobility and self care tasks. Pt educated in cervical precautions, self care skills, AE, and home/routines modifications to maximize safety and functional independence while minimizing falls risk and maintaining precautions. Pt verbalized understanding of all education/training provided. Handout provided to support recall and carry over of learned precautions/techniques for bed mobility, functional transfers, and self care skills.  Pt would benefit from skilled OT to address noted impairments and functional limitations (see below for any additional details) in order to maximize safety and independence while minimizing falls risk and caregiver burden. Upon hospital DC recommend pt follow surgeons recommendation for follow-up therapies.      Follow Up Recommendations  Follow surgeon's recommendation for DC plan and follow-up therapies    Equipment Recommendations  None recommended by OT    Recommendations for Other Services       Precautions / Restrictions Precautions Precautions: Cervical Precaution Booklet Issued: Yes (comment) Precaution Comments: no bending, arching, twisting Required Braces or Orthoses: Cervical Brace Restrictions Weight Bearing Restrictions: No      Mobility Bed Mobility Overal bed mobility: Independent Bed  Mobility: Supine to Sit     Supine to sit: Modified independent (Device/Increase time)     General bed mobility comments: Pt able to easily get to EOB, no strain on neck  Transfers Overall transfer level: Independent Equipment used: None             General transfer comment: Pt able to rise to standing w/o hesitation or safety issues    Balance Overall balance assessment: Independent;No apparent balance deficits (not formally assessed)                                         ADL either performed or assessed with clinical judgement   ADL                                         General ADL Comments: Pt supervision level for ADL mgt with aspen collar donned. Pt educated in cervical precautions including no bending, arching, twisting as well as considerations for ADL mgt with handout provided. Pt confident he will have assistance needed once home.     Vision Baseline Vision/History: Wears glasses Wears Glasses: Reading only Patient Visual Report: No change from baseline       Perception     Praxis      Pertinent Vitals/Pain Pain Assessment: 0-10 Pain Score: 6  Pain Location: ant and post neck Pain Descriptors / Indicators: Sore;Discomfort Pain Intervention(s): Limited activity within patient's tolerance;Monitored during session     Hand Dominance Right   Extremity/Trunk Assessment Upper Extremity Assessment Upper Extremity Assessment: Overall WFL for tasks  assessed(Pt reporting some continued RUE numbness/weakness. RLE WFL grossly 4+/5 to 5/5 with marginal loss of position with shoulder abduction this date. LUE WNL 5/5 t/o.)   Lower Extremity Assessment Lower Extremity Assessment: Overall WFL for tasks assessed;Defer to PT evaluation       Communication Communication Communication: No difficulties   Cognition Arousal/Alertness: Awake/alert Behavior During Therapy: WFL for tasks assessed/performed Overall Cognitive Status:  Within Functional Limits for tasks assessed                                     General Comments  Pt with aspen collar donned t/o assessment    Exercises Other Exercises Other Exercises: Pt educated in falls prevention strategies, cervical spinal precautions and considerations for ADL mgt, and safe bed mobility techniques this date. Handout provided. Would benefit from reinforcement of education provided.   Shoulder Instructions      Home Living Family/patient expects to be discharged to:: Private residence Living Arrangements: Spouse/significant other Available Help at Discharge: Family;Available 24 hours/day Type of Home: House Home Access: Level entry     Home Layout: Able to live on main level with bedroom/bathroom;Multi-level     Bathroom Shower/Tub: Occupational psychologist: Standard     Home Equipment: Hand held shower head          Prior Functioning/Environment Level of Independence: Independent        Comments: Pt works a Public relations account executive job, drives, independent with ADL/IADL mgt.        OT Problem List: Decreased coordination;Pain;Decreased activity tolerance;Decreased safety awareness;Decreased knowledge of precautions;Decreased knowledge of use of DME or AE;Impaired sensation      OT Treatment/Interventions: Self-care/ADL training;Balance training;Therapeutic exercise;Therapeutic activities;DME and/or AE instruction;Patient/family education    OT Goals(Current goals can be found in the care plan section) Acute Rehab OT Goals Patient Stated Goal: recover quickly and get back to work OT Goal Formulation: With patient Time For Goal Achievement: 10/25/18 Potential to Achieve Goals: Good ADL Goals Pt Will Perform Grooming: sitting;with modified independence(With LRAD PRN for improved safety and adherence to cervical precautions.) Pt Will Perform Upper Body Bathing: sitting;with modified independence(With LRAD PRN for improved safety and  adherence to cervical precautions) Pt Will Perform Upper Body Dressing: with modified independence;sitting(With LRAD PRN for improved safety and adherence to cervical precautions)  OT Frequency: Min 1X/week   Barriers to D/C:            Co-evaluation              AM-PAC OT "6 Clicks" Daily Activity     Outcome Measure Help from another person eating meals?: None Help from another person taking care of personal grooming?: None Help from another person toileting, which includes using toliet, bedpan, or urinal?: A Little Help from another person bathing (including washing, rinsing, drying)?: A Little Help from another person to put on and taking off regular upper body clothing?: A Little Help from another person to put on and taking off regular lower body clothing?: A Little 6 Click Score: 20   End of Session Equipment Utilized During Treatment: Gait belt  Activity Tolerance: Patient tolerated treatment well Patient left: in chair;with chair alarm set;with call bell/phone within reach;with family/visitor present  OT Visit Diagnosis: Other abnormalities of gait and mobility (R26.89);Pain Pain - Right/Left: (both) Pain - part of body: (neck)  Time: [id number] OT Time Calculation (min): 14 min Charges:  OT General Charges $OT Visit: 1 Visit OT Evaluation $OT Eval Low Complexity: 1 Low OT Treatments $Self Care/Home Management : 8-22 mins  Shara Blazing, M.S., OTR/L Ascom: [phone] 10/11/18, 4:00 PM

## 2018-10-11 NOTE — Discharge Instructions (Signed)

## 2018-10-11 NOTE — Evaluation (Signed)
Physical Therapy Evaluation Patient Details Name: Tony Cardenas MRN: [id number] DOB: 07-17-1970 Today's Date: 10/11/2018   History of Present Illness  48 y/o male s/p C4 corpectomy, C3-C5 discectomy 9/30.    Clinical Impression  Pt very pleasant t/o session, eager and willing to work with PT and ultimately did very well.  He does have some residual numbness in b/l UEs (that he reports is consistently improving) but otherwise had no issues with function, safety, coordination, etc.  He easily circumambulate the nurses' station with good speed and safety, negotiated steps w/o issue; answered questions regarding return to home and progression on recovery.  Will maintain on caseload should he not discharge for some reason, safe to discharge home from PT stand-point.     Follow Up Recommendations Follow surgeon's recommendation for DC plan and follow-up therapies    Equipment Recommendations  None recommended by PT    Recommendations for Other Services       Precautions / Restrictions Precautions Precautions: Cervical Precaution Comments: no bending, arching, twisting Required Braces or Orthoses: Cervical Brace Restrictions Weight Bearing Restrictions: No      Mobility  Bed Mobility Overal bed mobility: Independent Bed Mobility: Supine to Sit     Supine to sit: Modified independent (Device/Increase time)     General bed mobility comments: Pt able to easily get to EOB, no strain on neck  Transfers Overall transfer level: Independent Equipment used: None             General transfer comment: Pt able to rise to standing w/o hesitation or safety issues  Ambulation/Gait Ambulation/Gait assistance: Modified independent (Device/Increase time) Gait Distance (Feet): 250 Feet Assistive device: None       General Gait Details: Pt displays good confidence, safety and speed t/o the effort.  Minimal fatigue or other complications.  Stairs Stairs: Yes Stairs assistance:  Modified independent (Device/Increase time) Stair Management: One rail Right Number of Stairs: 4 General stair comments: Pt able to easily and confidently negotiate up/down steps  Wheelchair Mobility    Modified Rankin (Stroke Patients Only)       Balance Overall balance assessment: Independent                                           Pertinent Vitals/Pain Pain Assessment: 0-10 Pain Score: 6  Pain Location: ant and post neck    Home Living Family/patient expects to be discharged to:: Private residence Living Arrangements: Spouse/significant other Available Help at Discharge: Family;Available 24 hours/day Type of Home: House Home Access: Level entry     Home Layout: Able to live on main level with bedroom/bathroom(has 3 + 3 steps to get to other section of the home) Home Equipment: None      Prior Function Level of Independence: Independent         Comments: Pt works a Public relations account executive job, drives, 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     Hand Dominance        Extremity/Trunk Assessment   Upper Extremity Assessment Upper Extremity Assessment: Overall WFL for tasks assessed(still reporting some numbness b/l, strength = b/l)    Lower Extremity Assessment Lower Extremity Assessment: Overall WFL for tasks assessed       Communication   Communication: No difficulties  Cognition Arousal/Alertness: Awake/alert Behavior During Therapy: WFL for tasks assessed/performed Overall Cognitive Status: Within Functional Limits for tasks assessed  General Comments      Exercises     Assessment/Plan    PT Assessment Patient needs continued PT services  PT Problem List Decreased activity tolerance;Decreased knowledge of use of DME;Pain;Decreased knowledge of precautions;Decreased mobility;Decreased balance;Decreased strength;Decreased range of motion;Decreased coordination       PT Treatment  Interventions Gait training;Stair training;Functional mobility training;Therapeutic activities;Therapeutic exercise;Balance training;Neuromuscular re-education;Patient/family education    PT Goals (Current goals can be found in the Care Plan section)  Acute Rehab PT Goals Patient Stated Goal: recover quickly and get back to work PT Goal Formulation: With patient Time For Goal Achievement: 10/25/18 Potential to Achieve Goals: Good    Frequency 7X/week   Barriers to discharge        Co-evaluation               AM-PAC PT "6 Clicks" Mobility  Outcome Measure Help needed turning from your back to your side while in a flat bed without using bedrails?: None Help needed moving from lying on your back to sitting on the side of a flat bed without using bedrails?: None Help needed moving to and from a bed to a chair (including a wheelchair)?: None Help needed standing up from a chair using your arms (e.g., wheelchair or bedside chair)?: None Help needed to walk in hospital room?: None Help needed climbing 3-5 steps with a railing? : None 6 Click Score: 24    End of Session Equipment Utilized During Treatment: Gait belt Activity Tolerance: Patient tolerated treatment well Patient left: with chair alarm set;with family/visitor present;with call bell/phone within reach Nurse Communication: Mobility status PT Visit Diagnosis: Muscle weakness (generalized) (M62.81);Pain Pain - part of body: (cervicalgia)    Time: [id number] PT Time Calculation (min) (ACUTE ONLY): 21 min   Charges:   PT Evaluation $PT Eval Low Complexity: 1 Low PT Treatments $Therapeutic Activity: 8-22 mins        Kreg Shropshire, DPT 10/11/2018, 1:10 PM

## 2018-10-11 NOTE — Discharge Summary (Signed)
Procedure: C4 corpectomy, C3-C5 discectomy Procedure date: 10/10/2018 Diagnosis: Cervical myelopathy  History: Tony Cardenas is s/p C4 corpectomy for cervical myelopathy.  POD1: Recovering well. Eating breakfast this morning with no complaints of swallowing. Pain has improved since yesterday. Continues to experience decreased sensation in upper extremities but feels neck pain/stiffness and strength have already improved. Denies new pain/numbness/tingling in upper or lower extremities.  Drain output 80 Voiding without issue. He has not been out of bed yet.    Update: Drain removed without issue.  No home health services recommended after PT and OT evaluation.  POD0: A procedure well without complication.  Seen in postop recovery still disoriented from anesthesia but able to answer questions and obey commands.  Complains of 10/10 anterior and posterior neck pain.  Complains of continued numbness, most prominent in right upper extremity.  Unable determine if upper extremity pain has resolved at this point.  Swallowing without issue.  He was able to void postoperatively.  Physical Exam: Vitals:   10/11/18 0741 10/11/18 1627  BP: 114/71 131/72  Pulse: 75 68  Resp: 16 20  Temp: 98 F (36.7 C) 97.9 F (36.6 C)  SpO2: 96% 97%   Strength:5/5 throughout upper and lower extremities. Sensation: Decreased sensation diffusely through upper extremities.  Skin: Glue intact at incision site.  Dressing clean and dry at drain insertion point.  No active bleeding noted.  Data:  Recent Labs  Lab 10/05/18 1300  NA 140  K 4.0  CL 107  CO2 24  BUN 9  CREATININE 0.57*  GLUCOSE 103*  CALCIUM 8.9   No results for input(s): AST, ALT, ALKPHOS in the last 168 hours.  Invalid input(s): TBILI   Recent Labs  Lab 10/05/18 1300  WBC 12.4*  HGB 15.3  HCT 43.5  PLT 411*   Recent Labs  Lab 10/05/18 1300  APTT 42*  INR 1.1         Other tests/results:  EXAM: CERVICAL SPINE - 2-3  VIEW  10/11/2018  COMPARISON:  Radiographs 05/30/2017  FINDINGS: Remote posterior wire fusion at C1-2. The more anterior wires are fractured. Prior C5-6 anterior plate and screws demonstrate removal with subsequent corpectomy interbody fusion device and anterior plate and screws from C3 to C5.  IMPRESSION: Corpectomy with anterior interbody fusion changes from C3-C5. Assessment/Plan:  Tony Cardenas is POD 1 status post C4 corpectomy for cervical myelopathy.  He is recovering well.  Brace is been received.  He is ambulating, voiding, eating without issue.  Pain is adequately controlled on current pain regimen.  We will continue postop pain control with Tylenol, Robaxin, and oxycodone as needed.  Discussed wound care and activity restrictions.  Drain was pulled without issue.  He is scheduled to follow-up in approximately 2 weeks to monitor progress.  Advised to contact office if any questions or concerns arise before then.  Patient expressed understanding and agreement of plan.  Marin Olp PA-C Department of Neurosurgery

## 2018-10-12 ENCOUNTER — Other Ambulatory Visit: Payer: Self-pay | Admitting: *Deleted

## 2018-10-12 ENCOUNTER — Encounter: Payer: Self-pay | Admitting: *Deleted

## 2018-10-12 NOTE — Patient Outreach (Addendum)
[facility]) Care Management  10/12/2018  Tony Cardenas 08-29-1970 [id number]   Transition of care telephone call  Referral received: 10/05/18 Initial outreach: 10/13/18 Insurance: [place]  Initial unsuccessful telephone call to patient's home/mobile number in order to complete transition of care assessment; no answer, left HIPAA compliant voicemail message requesting return call.   Objective: Per the electronic medical record, Tony Cardenas was hospitalized at [facility] from 9/30-10/1 for surgery to treat cervical myelopathy. On 10/10/18 he had anterior cervical corpectomy of C4, removing greater than 50% of the vertebral body at that level, Anterior Arthrodesis from C3 to C5, Anterior cervical spine instrumentation C3 -C5 using Globus Xtend, Insertion of biomechanic device (Globus Fortify) as a vertebral body replacement, Harvesting of autograft via the same incision, Use of the operative microscope for aid in microdissection.  Comorbidities include: Type 2 DM, HTN, COPD, GERD, arthritis, renal calculus, hyperlipidemia, polyarthralgia, Vit D deficiency He was discharged to home on 10/11/18 without the need for home health services or durable medical equipment per the discharge summary.   Plan: Unsuccessful outreach letter not routed a patient returned call 15 minutes after RNCM left message. See other note of today for complete transition of care assessment.  Tony Ellison RN,CCM,CDE [place] Management Coordinator Office Phone [phone] Office Fax [phone]

## 2018-10-12 NOTE — Patient Outreach (Signed)
[facility]) Care Management  10/12/2018  EMANNUEL DICICCO 10/01/1970 [id number]   Transition of care call/case closure   Referral received: 10/05/18 Initial outreach: 10/12/18 Insurance: [employer] Save Plan   Subjective: Patient returned call in response to message left 15 minutes ago by this RNCM.  2 HIPAA identifiers verified. Explained purpose of call and completed transition of care assessment.  [name] states he is doing OK, says his throat is sore when he swallows  but he is not having difficulty breathing or swallowing food and liquids. He says his surgical incision is unremarkable, states surgical pain well managed with prescribed medications, denies bowel or bladder problems.  Spouse, son, Mom and Mother-in-Law are assisting with his recovery.  He denies offer of referral to one of the [place] chronic disease management programs. State he used to participate in the [employer] but his job would not allow him to attend the classes. He says he doesn't feel he needs assistance with his diabetes management, or other chronic health issues, at this time. He says he uses a Ambulance person.  He denies educational needs related to staying safe during the COVID 19 pandemic.     Objective: Per the electronic medical record, Mr. Uptegrove was hospitalized at [facility] from 9/30-10/1 for surgery to treat cervical myelopathy. On 10/10/18 he had anterior cervical corpectomy of C4, removing greater than 50% of the vertebral body at that level, Anterior Arthrodesis from C3to C5, Anterior cervical spine instrumentation C3-C5 using Globus Xtend, Insertion of biomechanic device (Globus Fortify) as a vertebral body replacement, Harvesting of autograft via the same incision, Use of the operative microscope for aid in microdissection.  Comorbidities include: Type 2 DM, HTN, COPD, GERD, arthritis, renal calculus, hyperlipidemia,  polyarthralgia, Vit D deficiency He was discharged to home on 10/11/18 without the need for home health services or durable medical equipment per the discharge summary.   Assessment:  Patient voices good understanding of all discharge instructions.  See transition of care flowsheet for assessment details.   Plan:  Reviewed hospital discharge diagnosis of cervical spine surgery and discharge treatment plan using hospital discharge instructions, assessing medication adherence, reviewing problems requiring provider notification, and discussing the importance of follow up with surgeon as directed. No ongoing care management needs identified so will close case to [place] Management services and route successful outreach letter with [place] Management pamphlet and 24 Hour Nurse Line Magnet to [place] Management clinical pool to be mailed to patient's home address.  Barrington Ellison RN,CCM,CDE [place] Management Coordinator Office Phone [phone] Office Fax [phone]

## 2018-10-15 ENCOUNTER — Other Ambulatory Visit: Payer: Self-pay | Admitting: *Deleted

## 2018-10-15 NOTE — Patient Outreach (Signed)
[facility]) Care Management  10/15/2018  Tony Cardenas [birth year] [id number]   EMMI: General Discharge Referral date:  10/15/18 Referral reason: Red Flag- patient answered affirmative to the following questions:  Questions about discharge papers; Unfilled prescriptions; Other questions/problems Insurance: [place] Day # 1  Outreach attempt #1   Initial successful telephone call to patient's mobile number; 2 HIPAA identifiers verified, explained purpose of call.  Subjective: Tony Cardenas states his wife found his hospital discharge instruction papers and he no longer has any questions related to his discharge medications or discharge instructions. He says he has all of the medications he was instructed to take at discharge.  He had no further questions or concerns. States his surgical pian is improving.   Plan:  Will close case to [place] Management services.  Barrington Ellison RN,CCM,CDE [place] Management Coordinator Office Phone [phone] Office Fax [phone]

## 2018-10-18 ENCOUNTER — Other Ambulatory Visit: Payer: Self-pay | Admitting: *Deleted

## 2018-10-18 NOTE — Patient Outreach (Signed)
[facility]) Care Management  10/18/2018  Tony Cardenas 05-11-1970 [id number]   EMMI: General Discharge Referral date:  10/18/18 Referral reason: Red Flag- patient answered affirmative to the following questions:  Questions about lost interest in things and he also indicated he needed additional pain medication Insurance: [place] Day # 4  Outreach attempt #1   Initial successful telephone call to patient's mobile number; 2 HIPAA identifiers verified, explained purpose of call.  Subjective: [name] states he accidentally  answered yes to the [facility] question regarding losing interest in things and says he was not able to change his answer.  He said he called the [facility] CM office and left a message requesting  this RNCM's assistance in securing more narcotic pain medication. He states he will run out of pain medication by the end of today. He says his sore throat is better and that his neck incision continues to look good. He had no further questions or concerns.   Plan:  Provided [name] with the phone number to his surgeon's office and advised him how to present the need for additional pain medication in his message to the provider's office including how often he is taking the 5 mg oxycodone and its efficacy on the pain scale of 0-10.   Will close case to [place] Management services.  Barrington Ellison RN,CCM,CDE [place] Management Coordinator Office Phone [phone] Office Fax [phone]

## 2018-11-09 DIAGNOSIS — Z760 Encounter for issue of repeat prescription: Secondary | ICD-10-CM | POA: Diagnosis not present

## 2018-11-15 DIAGNOSIS — M4322 Fusion of spine, cervical region: Secondary | ICD-10-CM | POA: Diagnosis not present

## 2018-11-15 DIAGNOSIS — G959 Disease of spinal cord, unspecified: Secondary | ICD-10-CM | POA: Diagnosis not present

## 2018-11-15 DIAGNOSIS — Z981 Arthrodesis status: Secondary | ICD-10-CM | POA: Diagnosis not present

## 2018-12-12 ENCOUNTER — Other Ambulatory Visit: Payer: Self-pay

## 2018-12-12 ENCOUNTER — Ambulatory Visit (INDEPENDENT_AMBULATORY_CARE_PROVIDER_SITE_OTHER): Payer: 59 | Admitting: Family Medicine

## 2018-12-12 ENCOUNTER — Encounter: Payer: Self-pay | Admitting: Family Medicine

## 2018-12-12 VITALS — BP 130/79 | HR 101

## 2018-12-12 DIAGNOSIS — E1165 Type 2 diabetes mellitus with hyperglycemia: Secondary | ICD-10-CM | POA: Diagnosis not present

## 2018-12-12 DIAGNOSIS — Z23 Encounter for immunization: Secondary | ICD-10-CM | POA: Diagnosis not present

## 2018-12-12 DIAGNOSIS — E559 Vitamin D deficiency, unspecified: Secondary | ICD-10-CM | POA: Diagnosis not present

## 2018-12-12 DIAGNOSIS — E782 Mixed hyperlipidemia: Secondary | ICD-10-CM

## 2018-12-12 DIAGNOSIS — I10 Essential (primary) hypertension: Secondary | ICD-10-CM | POA: Diagnosis not present

## 2018-12-12 DIAGNOSIS — R7989 Other specified abnormal findings of blood chemistry: Secondary | ICD-10-CM | POA: Diagnosis not present

## 2018-12-12 MED ORDER — METFORMIN HCL ER 500 MG PO TB24: 1000.0000 mg | ORAL_TABLET | Freq: Two times a day (BID) | ORAL | 1 refills | Status: DC

## 2018-12-12 MED ORDER — FLUTICASONE PROPIONATE 50 MCG/ACT NA SUSP: NASAL | 12 refills | Status: DC

## 2018-12-12 MED ORDER — METHOCARBAMOL 500 MG PO TABS: 500.0000 mg | ORAL_TABLET | Freq: Four times a day (QID) | ORAL | 1 refills | Status: DC

## 2018-12-12 MED ORDER — LISINOPRIL 5 MG PO TABS: 5.0000 mg | ORAL_TABLET | Freq: Every day | ORAL | 1 refills | Status: DC

## 2018-12-12 MED ORDER — ALBUTEROL SULFATE HFA 108 (90 BASE) MCG/ACT IN AERS: 2.0000 | INHALATION_SPRAY | Freq: Four times a day (QID) | RESPIRATORY_TRACT | 3 refills | Status: DC | PRN

## 2018-12-12 NOTE — Assessment & Plan Note (Signed)
Sugar under good control prior to his surgery. We will recheck labs. Continue diet and exercise. Call with any concerns.

## 2018-12-12 NOTE — Progress Notes (Signed)
BP 130/79   Pulse (!) 101    Subjective:    Patient ID: Tony Cardenas, male    DOB: 1970-08-05, 48 y.o.   MRN: [id number]  HPI: Tony Cardenas is a 48 y.o. male  Chief Complaint  Patient presents with  . Diabetes   DIABETES Hypoglycemic episodes:no Polydipsia/polyuria: no Visual disturbance: no Chest pain: no Paresthesias: no Glucose Monitoring: no  Accucheck frequency: Not Checking Taking Insulin?: no Blood Pressure Monitoring: not checking Retinal Examination: Up to Date Foot Exam: Not up to Date Diabetic Education: Completed Pneumovax: Up to Date Influenza: Not up to Date Aspirin: no   Relevant past medical, surgical, family and social history reviewed and updated as indicated. Interim medical history since our last visit reviewed. Allergies and medications reviewed and updated.  Review of Systems  Constitutional: Negative.   Respiratory: Negative.   Cardiovascular: Negative.   Musculoskeletal: Positive for myalgias, neck pain and neck stiffness. Negative for arthralgias, back pain, gait problem and joint swelling.  Skin: Negative.   Psychiatric/Behavioral: Negative.     Per HPI unless specifically indicated above     Objective:    BP 130/79   Pulse (!) 101   Wt Readings from Last 3 Encounters:  10/10/18 243 lb 13.3 oz (110.6 kg)  10/05/18 243 lb 12.8 oz (110.6 kg)  01/16/18 252 lb (114.3 kg)    Physical Exam Vitals signs and nursing note reviewed.  Constitutional:      General: He is not in acute distress.    Appearance: Normal appearance. He is not ill-appearing, toxic-appearing or diaphoretic.  HENT:     Head: Normocephalic and atraumatic.     Right Ear: External ear normal.     Left Ear: External ear normal.     Nose: Nose normal.     Mouth/Throat:     Mouth: Mucous membranes are moist.     Pharynx: Oropharynx is clear.  Eyes:     General: No scleral icterus.       Right eye: No discharge.        Left eye: No discharge.   Conjunctiva/sclera: Conjunctivae normal.     Pupils: Pupils are equal, round, and reactive to light.  Neck:     Musculoskeletal: Normal range of motion.  Pulmonary:     Effort: Pulmonary effort is normal. No respiratory distress.     Comments: Speaking in full sentences Musculoskeletal: Normal range of motion.  Skin:    Coloration: Skin is not jaundiced or pale.     Findings: No bruising, erythema, lesion or rash.  Neurological:     Mental Status: He is alert and oriented to person, place, and time. Mental status is at baseline.  Psychiatric:        Mood and Affect: Mood normal.        Behavior: Behavior normal.        Thought Content: Thought content normal.        Judgment: Judgment normal.     Results for orders placed or performed during the hospital encounter of 10/10/18  Glucose, capillary  Result Value Ref Range   Glucose-Capillary 130 (H) 70 - 99 mg/dL  Glucose, capillary  Result Value Ref Range   Glucose-Capillary 150 (H) 70 - 99 mg/dL  Hemoglobin A1c  Result Value Ref Range   Hgb A1c MFr Bld 6.6 (H) 4.8 - 5.6 %   Mean Plasma Glucose 142.72 mg/dL  Glucose, capillary  Result Value Ref Range   Glucose-Capillary 246 (H)  70 - 99 mg/dL  Glucose, capillary  Result Value Ref Range   Glucose-Capillary 214 (H) 70 - 99 mg/dL  Glucose, capillary  Result Value Ref Range   Glucose-Capillary 138 (H) 70 - 99 mg/dL  ABO/Rh  Result Value Ref Range   ABO/RH(D)      A POS Performed at [facility], [address] [place], [place] [address]       Assessment & Plan:   Problem List Items Addressed This Visit      Cardiovascular and Mediastinum   HTN (hypertension) - Primary    Running out of insurance at the end of the year. Will refill his medicine and check labs. Has been doing well.       Relevant Medications   lisinopril (ZESTRIL) 5 MG tablet   Other Relevant Orders   CBC with Differential OUT   Comp Met (CMET)   Microalbumin, Urine Waived      Endocrine   Type 2 diabetes mellitus with hyperglycemia (HCC)    Sugar under good control prior to his surgery. We will recheck labs. Continue diet and exercise. Call with any concerns.       Relevant Medications   lisinopril (ZESTRIL) 5 MG tablet   metFORMIN (GLUCOPHAGE-XR) 500 MG 24 hr tablet   Other Relevant Orders   CBC with Differential OUT   Comp Met (CMET)   Microalbumin, Urine Waived   UA/M w/rflx Culture, Routine   Bayer DCA Hb A1c Waived     Other   Hyperlipidemia    Running out of insurance at the end of the year. Will refill his medicine and check labs. Has been doing well.       Relevant Medications   lisinopril (ZESTRIL) 5 MG tablet   Other Relevant Orders   CBC with Differential OUT   Comp Met (CMET)   Lipid Panel w/o Chol/HDL Ratio OUT   Abnormal TSH    Running out of insurance at the end of the year. Will refill his medicine and check labs. Has been doing well.       Relevant Orders   CBC with Differential OUT   Comp Met (CMET)   TSH   Vitamin D deficiency    Running out of insurance at the end of the year. Will refill his medicine and check labs. Has been doing well.       Relevant Orders   CBC with Differential OUT   Comp Met (CMET)   Vit D  25 hydroxy (rtn osteoporosis monitoring)    Other Visit Diagnoses    Immunization due       Relevant Orders   Flu Vaccine QUAD 6+ mos PF IM (Fluarix Quad PF)       Follow up plan: Return in about 6 months (around 06/12/2019) for physical.    . This visit was completed via Doximity due to the restrictions of the COVID-19 pandemic. All issues as above were discussed and addressed. Physical exam was done as above through visual confirmation on Doximity. If it was felt that the patient should be evaluated in the office, they were directed there. The patient verbally consented to this visit. . Location of the patient: home . Location of the provider: home . Those involved with this call:  . Provider: Park Liter, DO . CMA: Tiffany Reel, CMA . Front Desk/Registration: Don Perking  . Time spent on call: 15 minutes with patient face to face via video conference. More than 50% of this time  was spent in counseling and coordination of care. 23 minutes total spent in review of patient's record and preparation of their chart.

## 2018-12-12 NOTE — Assessment & Plan Note (Signed)
Running out of insurance at the end of the year. Will refill his medicine and check labs. Has been doing well.

## 2018-12-13 ENCOUNTER — Other Ambulatory Visit: Payer: Self-pay

## 2018-12-13 ENCOUNTER — Other Ambulatory Visit: Payer: 59

## 2018-12-13 ENCOUNTER — Ambulatory Visit: Payer: Self-pay | Admitting: Family Medicine

## 2018-12-13 DIAGNOSIS — Z23 Encounter for immunization: Secondary | ICD-10-CM

## 2018-12-13 DIAGNOSIS — R7989 Other specified abnormal findings of blood chemistry: Secondary | ICD-10-CM

## 2018-12-13 DIAGNOSIS — E782 Mixed hyperlipidemia: Secondary | ICD-10-CM

## 2018-12-13 DIAGNOSIS — I10 Essential (primary) hypertension: Secondary | ICD-10-CM | POA: Diagnosis not present

## 2018-12-13 DIAGNOSIS — E1165 Type 2 diabetes mellitus with hyperglycemia: Secondary | ICD-10-CM

## 2018-12-13 DIAGNOSIS — E559 Vitamin D deficiency, unspecified: Secondary | ICD-10-CM

## 2018-12-13 LAB — UA/M W/RFLX CULTURE, ROUTINE
Bilirubin, UA: NEGATIVE
Ketones, UA: NEGATIVE
Leukocytes,UA: NEGATIVE
Nitrite, UA: NEGATIVE
Protein,UA: NEGATIVE
Specific Gravity, UA: 1.02 (ref 1.005–1.030)
Urobilinogen, Ur: 0.2 mg/dL (ref 0.2–1.0)
pH, UA: 5 (ref 5.0–7.5)

## 2018-12-13 LAB — MICROALBUMIN, URINE WAIVED
Creatinine, Urine Waived: 100 mg/dL (ref 10–300)
Microalb, Ur Waived: 10 mg/L (ref 0–19)
Microalb/Creat Ratio: <30 mg/g (ref <30)

## 2018-12-13 LAB — BAYER DCA HB A1C WAIVED: HB A1C (BAYER DCA - WAIVED): 6.5 % (ref <7.0)

## 2018-12-13 LAB — MICROSCOPIC EXAMINATION
Bacteria, UA: NONE SEEN
WBC, UA: NONE SEEN /hpf (ref 0–5)

## 2018-12-14 ENCOUNTER — Telehealth: Payer: Self-pay | Admitting: Family Medicine

## 2018-12-14 LAB — COMPREHENSIVE METABOLIC PANEL
ALT: 15 IU/L (ref 0–44)
AST: 10 IU/L (ref 0–40)
Albumin/Globulin Ratio: 2.1 (ref 1.2–2.2)
Albumin: 4.6 g/dL (ref 4.0–5.0)
Alkaline Phosphatase: 78 IU/L (ref 39–117)
BUN/Creatinine Ratio: 14 (ref 9–20)
BUN: 9 mg/dL (ref 6–24)
Bilirubin Total: 0.2 mg/dL (ref 0.0–1.2)
CO2: 22 mmol/L (ref 20–29)
Calcium: 9.2 mg/dL (ref 8.7–10.2)
Chloride: 100 mmol/L (ref 96–106)
Creatinine, Ser: 0.66 mg/dL — ABNORMAL LOW (ref 0.76–1.27)
GFR calc Af Amer: 130 mL/min/{1.73_m2} (ref >59)
GFR calc non Af Amer: 113 mL/min/{1.73_m2} (ref >59)
Globulin, Total: 2.2 g/dL (ref 1.5–4.5)
Glucose: 158 mg/dL — ABNORMAL HIGH (ref 65–99)
Potassium: 4.2 mmol/L (ref 3.5–5.2)
Sodium: 137 mmol/L (ref 134–144)
Total Protein: 6.8 g/dL (ref 6.0–8.5)

## 2018-12-14 LAB — CBC WITH DIFFERENTIAL/PLATELET
Basophils Absolute: 0.1 10*3/uL (ref 0.0–0.2)
Basos: 1 %
EOS (ABSOLUTE): 0.2 10*3/uL (ref 0.0–0.4)
Eos: 2 %
Hematocrit: 47.2 % (ref 37.5–51.0)
Hemoglobin: 16.3 g/dL (ref 13.0–17.7)
Immature Grans (Abs): 0.1 10*3/uL (ref 0.0–0.1)
Immature Granulocytes: 1 %
Lymphocytes Absolute: 3.5 10*3/uL — ABNORMAL HIGH (ref 0.7–3.1)
Lymphs: 31 %
MCH: 32 pg (ref 26.6–33.0)
MCHC: 34.5 g/dL (ref 31.5–35.7)
MCV: 93 fL (ref 79–97)
Monocytes Absolute: 0.7 10*3/uL (ref 0.1–0.9)
Monocytes: 6 %
Neutrophils Absolute: 6.9 10*3/uL (ref 1.4–7.0)
Neutrophils: 59 %
Platelets: 429 10*3/uL (ref 150–450)
RBC: 5.1 x10E6/uL (ref 4.14–5.80)
RDW: 13.1 % (ref 11.6–15.4)
WBC: 11.5 10*3/uL — ABNORMAL HIGH (ref 3.4–10.8)

## 2018-12-14 LAB — LIPID PANEL W/O CHOL/HDL RATIO
Cholesterol, Total: 236 mg/dL — ABNORMAL HIGH (ref 100–199)
HDL: 27 mg/dL — ABNORMAL LOW (ref >39)
LDL Chol Calc (NIH): 86 mg/dL (ref 0–99)
Triglycerides: 747 mg/dL (ref 0–149)
VLDL Cholesterol Cal: 123 mg/dL — ABNORMAL HIGH (ref 5–40)

## 2018-12-14 LAB — VITAMIN D 25 HYDROXY (VIT D DEFICIENCY, FRACTURES): Vit D, 25-Hydroxy: 29 ng/mL — ABNORMAL LOW (ref 30.0–100.0)

## 2018-12-14 LAB — TSH: TSH: 0.752 u[IU]/mL (ref 0.450–4.500)

## 2018-12-14 MED ORDER — EZETIMIBE 10 MG PO TABS: 10.0000 mg | ORAL_TABLET | Freq: Every day | ORAL | 3 refills | Status: DC

## 2018-12-14 NOTE — Telephone Encounter (Signed)
Please let him know the following:   [name], your labs look good, but your cholesterol is up quite a bit. I'd like to start you on zetia since you can't tolerate the statins. Let me know if you're OK with this. Thanks!

## 2018-12-14 NOTE — Telephone Encounter (Signed)
Patient notified of result. Patient is agreeable to starting Zetia. Pharmacy in chart confirmed with patient.

## 2018-12-20 ENCOUNTER — Other Ambulatory Visit
Admission: RE | Admit: 2018-12-20 | Discharge: 2018-12-20 | Disposition: A | Discharge status: Home / Self Care | Payer: 59 | Source: Ambulatory Visit | Attending: Internal Medicine | Admitting: Internal Medicine

## 2018-12-20 ENCOUNTER — Other Ambulatory Visit: Payer: Self-pay

## 2018-12-20 DIAGNOSIS — Z79899 Other long term (current) drug therapy: Secondary | ICD-10-CM | POA: Insufficient documentation

## 2018-12-20 DIAGNOSIS — M1990 Unspecified osteoarthritis, unspecified site: Secondary | ICD-10-CM | POA: Insufficient documentation

## 2018-12-20 DIAGNOSIS — M4722 Other spondylosis with radiculopathy, cervical region: Secondary | ICD-10-CM | POA: Insufficient documentation

## 2018-12-20 DIAGNOSIS — M4712 Other spondylosis with myelopathy, cervical region: Secondary | ICD-10-CM | POA: Diagnosis not present

## 2018-12-20 DIAGNOSIS — D473 Essential (hemorrhagic) thrombocythemia: Secondary | ICD-10-CM | POA: Diagnosis not present

## 2018-12-20 LAB — CBC WITH DIFFERENTIAL/PLATELET
Abs Immature Granulocytes: 0.1 10*3/uL — ABNORMAL HIGH (ref 0.00–0.07)
Basophils Absolute: 0.1 10*3/uL (ref 0.0–0.1)
Basophils Relative: 1 %
Eosinophils Absolute: 0.2 10*3/uL (ref 0.0–0.5)
Eosinophils Relative: 2 %
HCT: 44.2 % (ref 39.0–52.0)
Hemoglobin: 15.8 g/dL (ref 13.0–17.0)
Immature Granulocytes: 1 %
Lymphocytes Relative: 29 %
Lymphs Abs: 3.6 10*3/uL (ref 0.7–4.0)
MCH: 32.3 pg (ref 26.0–34.0)
MCHC: 35.7 g/dL (ref 30.0–36.0)
MCV: 90.4 fL (ref 80.0–100.0)
Monocytes Absolute: 0.7 10*3/uL (ref 0.1–1.0)
Monocytes Relative: 5 %
Neutro Abs: 7.8 10*3/uL — ABNORMAL HIGH (ref 1.7–7.7)
Neutrophils Relative %: 62 %
Platelets: 419 10*3/uL — ABNORMAL HIGH (ref 150–400)
RBC: 4.89 MIL/uL (ref 4.22–5.81)
RDW: 13.3 % (ref 11.5–15.5)
WBC: 12.5 10*3/uL — ABNORMAL HIGH (ref 4.0–10.5)
nRBC: 0 % (ref 0.0–0.2)

## 2018-12-20 LAB — CREATININE, SERUM
Creatinine, Ser: 0.76 mg/dL (ref 0.61–1.24)
GFR calc Af Amer: >60 mL/min (ref >60)
GFR calc non Af Amer: >60 mL/min (ref >60)

## 2018-12-20 LAB — AST: AST: 17 U/L (ref 15–41)

## 2018-12-20 LAB — ALT: ALT: 16 U/L (ref 0–44)

## 2019-01-10 DIAGNOSIS — M4322 Fusion of spine, cervical region: Secondary | ICD-10-CM | POA: Diagnosis not present

## 2019-01-10 DIAGNOSIS — G959 Disease of spinal cord, unspecified: Secondary | ICD-10-CM | POA: Diagnosis not present

## 2019-01-10 DIAGNOSIS — Z981 Arthrodesis status: Secondary | ICD-10-CM | POA: Diagnosis not present
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
## 2019-02-20 ENCOUNTER — Ambulatory Visit
Payer: 59 | Attending: Student in an Organized Health Care Education/Training Program | Admitting: Student in an Organized Health Care Education/Training Program

## 2019-02-20 ENCOUNTER — Other Ambulatory Visit: Payer: Self-pay

## 2019-02-20 ENCOUNTER — Encounter: Payer: Self-pay | Admitting: Family Medicine

## 2019-02-20 ENCOUNTER — Encounter: Payer: Self-pay | Admitting: Student in an Organized Health Care Education/Training Program

## 2019-02-20 VITALS — BP 125/80 | HR 89 | Temp 98.1°F | Resp 18 | Ht 71.0 in | Wt 238.0 lb

## 2019-02-20 DIAGNOSIS — Q761 Klippel-Feil syndrome: Secondary | ICD-10-CM | POA: Diagnosis not present

## 2019-02-20 DIAGNOSIS — G894 Chronic pain syndrome: Secondary | ICD-10-CM | POA: Insufficient documentation

## 2019-02-20 DIAGNOSIS — Z981 Arthrodesis status: Secondary | ICD-10-CM | POA: Diagnosis not present

## 2019-02-20 DIAGNOSIS — M5412 Radiculopathy, cervical region: Secondary | ICD-10-CM | POA: Diagnosis not present

## 2019-02-20 MED ORDER — PREGABALIN 50 MG PO CAPS: ORAL_CAPSULE | ORAL | 0 refills | Status: DC

## 2019-02-21 ENCOUNTER — Ambulatory Visit: Payer: 59 | Admitting: Student in an Organized Health Care Education/Training Program

## 2019-02-25 LAB — COMPLIANCE DRUG ANALYSIS, UR

## 2019-03-06 ENCOUNTER — Ambulatory Visit (INDEPENDENT_AMBULATORY_CARE_PROVIDER_SITE_OTHER): Payer: 59 | Admitting: Psychiatry

## 2019-03-06 ENCOUNTER — Encounter: Payer: Self-pay | Admitting: Psychiatry

## 2019-03-06 ENCOUNTER — Other Ambulatory Visit: Payer: Self-pay

## 2019-03-06 DIAGNOSIS — G894 Chronic pain syndrome: Secondary | ICD-10-CM

## 2019-03-06 DIAGNOSIS — F17200 Nicotine dependence, unspecified, uncomplicated: Secondary | ICD-10-CM | POA: Insufficient documentation

## 2019-03-06 DIAGNOSIS — Z008 Encounter for other general examination: Secondary | ICD-10-CM | POA: Diagnosis not present
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
## 2019-03-07 ENCOUNTER — Telehealth: Payer: Self-pay | Admitting: Student in an Organized Health Care Education/Training Program

## 2019-03-07 ENCOUNTER — Ambulatory Visit
Payer: 59 | Attending: Student in an Organized Health Care Education/Training Program | Admitting: Student in an Organized Health Care Education/Training Program

## 2019-03-07 ENCOUNTER — Encounter: Payer: Self-pay | Admitting: Student in an Organized Health Care Education/Training Program

## 2019-03-07 DIAGNOSIS — Z981 Arthrodesis status: Secondary | ICD-10-CM | POA: Diagnosis not present

## 2019-03-07 DIAGNOSIS — M5412 Radiculopathy, cervical region: Secondary | ICD-10-CM | POA: Diagnosis not present

## 2019-03-07 DIAGNOSIS — G894 Chronic pain syndrome: Secondary | ICD-10-CM | POA: Diagnosis not present

## 2019-03-07 DIAGNOSIS — Q761 Klippel-Feil syndrome: Secondary | ICD-10-CM | POA: Insufficient documentation

## 2019-03-07 MED ORDER — PREGABALIN 50 MG PO CAPS: 50.0000 mg | ORAL_CAPSULE | Freq: Every day | ORAL | 2 refills | Status: DC

## 2019-03-07 MED ORDER — HYDROCODONE-ACETAMINOPHEN 5-325 MG PO TABS: 1.0000 | ORAL_TABLET | Freq: Two times a day (BID) | ORAL | 0 refills | Status: AC | PRN

## 2019-03-07 MED ORDER — HYDROCODONE-ACETAMINOPHEN 5-325 MG PO TABS: 1.0000 | ORAL_TABLET | Freq: Two times a day (BID) | ORAL | 0 refills | Status: DC | PRN

## 2019-03-07 NOTE — Telephone Encounter (Signed)
Patient is out of Hydrocodone next appt is scheduled Thursday 03-14-19 no other appts available unless we add him to today 03-07-19

## 2019-03-07 NOTE — Telephone Encounter (Signed)
I would like if he does not have an appt for refill that you could add him on today.
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
## 2019-03-14 ENCOUNTER — Ambulatory Visit: Payer: 59 | Admitting: Student in an Organized Health Care Education/Training Program

## 2019-03-14 DIAGNOSIS — M960 Pseudarthrosis after fusion or arthrodesis: Secondary | ICD-10-CM | POA: Diagnosis not present

## 2019-03-14 DIAGNOSIS — G959 Disease of spinal cord, unspecified: Secondary | ICD-10-CM | POA: Diagnosis not present

## 2019-03-14 DIAGNOSIS — M4322 Fusion of spine, cervical region: Secondary | ICD-10-CM | POA: Diagnosis not present

## 2019-03-15 ENCOUNTER — Other Ambulatory Visit: Payer: Self-pay | Admitting: Neurosurgery

## 2019-03-15 DIAGNOSIS — M960 Pseudarthrosis after fusion or arthrodesis: Secondary | ICD-10-CM

## 2019-03-17 ENCOUNTER — Encounter: Payer: Self-pay | Admitting: Family Medicine

## 2019-03-20 ENCOUNTER — Other Ambulatory Visit: Payer: Self-pay

## 2019-03-20 ENCOUNTER — Encounter: Payer: Self-pay | Admitting: Family Medicine

## 2019-03-20 ENCOUNTER — Telehealth (INDEPENDENT_AMBULATORY_CARE_PROVIDER_SITE_OTHER): Payer: 59 | Admitting: Family Medicine

## 2019-03-20 DIAGNOSIS — Z720 Tobacco use: Secondary | ICD-10-CM | POA: Diagnosis not present

## 2019-03-20 MED ORDER — CHANTIX STARTING MONTH PAK 0.5 MG X 11 & 1 MG X 42 PO TABS: ORAL_TABLET | ORAL | 0 refills | Status: DC

## 2019-03-20 MED ORDER — VARENICLINE TARTRATE 1 MG PO TABS: 1.0000 mg | ORAL_TABLET | Freq: Two times a day (BID) | ORAL | 1 refills | Status: DC

## 2019-03-20 NOTE — Progress Notes (Signed)
There were no vitals taken for this visit.   Subjective:    Patient ID: Tony Cardenas, male    DOB: Oct 21, 1970, 49 y.o.   MRN: [id number]  HPI: Tony Cardenas is a 49 y.o. male  Chief Complaint  Patient presents with  . Nicotine Dependence    Patient would like something to help him quit smoking.   SMOKING CESSATION- needs to quit smoking in order to have surgery. Smoking Status: Current every day smoker Smoking Amount:  1+ppd Smoking Onset: 30+ years Smoking Quit Date: not set Smoking triggers: anxiety, eating Type of tobacco use: cigarettes Children in the house: no Other household members who smoke: no Treatments attempted: cold [place], nicotene Pneumovax: UTD  Relevant past medical, surgical, family and social history reviewed and updated as indicated. Interim medical history since our last visit reviewed. Allergies and medications reviewed and updated.  Review of Systems  Constitutional: Negative.   Respiratory: Negative.   Cardiovascular: Negative.   Musculoskeletal: Negative.   Neurological: Negative.   Psychiatric/Behavioral: Negative.     Per HPI unless specifically indicated above     Objective:    There were no vitals taken for this visit.  Wt Readings from Last 3 Encounters:  02/20/19 238 lb (108 kg)  10/10/18 243 lb 13.3 oz (110.6 kg)  10/05/18 243 lb 12.8 oz (110.6 kg)    Physical Exam Vitals and nursing note reviewed.  Constitutional:      General: He is not in acute distress.    Appearance: Normal appearance. He is not ill-appearing, toxic-appearing or diaphoretic.  HENT:     Head: Normocephalic and atraumatic.     Right Ear: External ear normal.     Left Ear: External ear normal.     Nose: Nose normal.     Mouth/Throat:     Mouth: Mucous membranes are moist.     Pharynx: Oropharynx is clear.  Eyes:     General: No scleral icterus.       Right eye: No discharge.        Left eye: No discharge.     Conjunctiva/sclera:  Conjunctivae normal.     Pupils: Pupils are equal, round, and reactive to light.  Pulmonary:     Effort: Pulmonary effort is normal. No respiratory distress.     Comments: Speaking in full sentences Musculoskeletal:        General: Normal range of motion.     Cervical back: Normal range of motion.  Skin:    Coloration: Skin is not jaundiced or pale.     Findings: No bruising, erythema, lesion or rash.  Neurological:     Mental Status: He is alert and oriented to person, place, and time. Mental status is at baseline.  Psychiatric:        Mood and Affect: Mood normal.        Behavior: Behavior normal.        Thought Content: Thought content normal.        Judgment: Judgment normal.     Results for orders placed or performed in visit on 02/20/19  Compliance Drug Analysis, Ur  Result Value Ref Range   Summary Note       Assessment & Plan:   Problem List Items Addressed This Visit    None    Visit Diagnoses    Tobacco abuse    -  Primary   Will start Chantix. Work on cutting down. Recheck 1 month. Call with any concerns.  Follow up plan: Return in about 4 weeks (around 04/17/2019).   . This visit was completed via MyChart due to the restrictions of the COVID-19 pandemic. All issues as above were discussed and addressed. Physical exam was done as above through visual confirmation on MyChart. If it was felt that the patient should be evaluated in the office, they were directed there. The patient verbally consented to this visit. . Location of the patient: home . Location of the provider: home . Those involved with this call:  . Provider: Park Liter, DO . CMA: Merilyn Baba, CMA . Front Desk/Registration: Don Perking  . Time spent on call: 15 minutes with patient face to face via video conference. More than 50% of this time was spent in counseling and coordination of care. 23 minutes total spent in review of patient's record and preparation of their  chart.

## 2019-03-26 ENCOUNTER — Telehealth: Payer: Self-pay

## 2019-03-26 NOTE — Telephone Encounter (Signed)
Copied from [place] [id number]. Topic: General - Other >> Mar 26, 2019 10:29 AM Yvette Rack wrote: Reason for CRM: Pt stated that he was suppose to receive a Rx for a medication for his nerves. Pt stated he would like the Rx sent to [place]   Dr. Wynetta Emery, do you know anything about this? I do not see any documentation of discussion of this.

## 2019-03-26 NOTE — Telephone Encounter (Signed)
Appt scheduled

## 2019-03-26 NOTE — Telephone Encounter (Signed)
Can we check on this? I'm not sure what he's talking about- I sent him in Chantix

## 2019-03-27 ENCOUNTER — Telehealth: Payer: Self-pay | Admitting: Family Medicine

## 2019-03-27 DIAGNOSIS — G959 Disease of spinal cord, unspecified: Secondary | ICD-10-CM | POA: Diagnosis not present

## 2019-03-28 ENCOUNTER — Other Ambulatory Visit: Payer: Self-pay

## 2019-03-28 ENCOUNTER — Ambulatory Visit
Admission: RE | Admit: 2019-03-28 | Discharge: 2019-03-28 | Disposition: A | Discharge status: Home / Self Care | Payer: 59 | Source: Ambulatory Visit | Attending: Neurosurgery | Admitting: Neurosurgery

## 2019-03-28 DIAGNOSIS — M960 Pseudarthrosis after fusion or arthrodesis: Secondary | ICD-10-CM | POA: Diagnosis not present

## 2019-03-28 DIAGNOSIS — M5032 Other cervical disc degeneration, mid-cervical region, unspecified level: Secondary | ICD-10-CM | POA: Diagnosis not present

## 2019-03-29 ENCOUNTER — Encounter: Payer: Self-pay | Admitting: Family Medicine

## 2019-03-29 ENCOUNTER — Telehealth (INDEPENDENT_AMBULATORY_CARE_PROVIDER_SITE_OTHER): Payer: 59 | Admitting: Family Medicine

## 2019-03-29 VITALS — Ht 70.0 in | Wt 245.0 lb

## 2019-03-29 DIAGNOSIS — F4322 Adjustment disorder with anxiety: Secondary | ICD-10-CM | POA: Diagnosis not present

## 2019-03-29 MED ORDER — HYDROXYZINE HCL 25 MG PO TABS: ORAL_TABLET | ORAL | 3 refills | Status: DC

## 2019-03-29 NOTE — Progress Notes (Signed)
Ht 5\' 10"  (1.778 m)   Wt 245 lb (111.1 kg)   BMI 35.15 kg/m    Subjective:    Patient ID: Tony Cardenas, male    DOB: 12/16/1970, 49 y.o.   MRN: [id number]  HPI: Tony Cardenas is a 49 y.o. male  Chief Complaint  Patient presents with  . Anxiety   Smoking has cut down. He is now smoking about 1/2 pack about every 2 days  ANXIETY/STRESS Duration: 3-4 days since trying to quit smoking Status:exacerbated Anxious mood: yes  Excessive worrying: no Irritability: yes  Sweating: no Nausea: no Palpitations:no Hyperventilation: no Panic attacks: no Agoraphobia: no  Obscessions/compulsions: no Depressed mood: yes Depression screen [facility] 2/9 03/29/2019 02/20/2019 01/16/2018 10/13/2016 08/21/2015  Decreased Interest 3 0 0 0 0  Down, Depressed, Hopeless 1 0 0 0 0  PHQ - 2 Score 4 0 0 0 0  Altered sleeping 3 - 0 - -  Tired, decreased energy 3 - 2 - -  Change in appetite 1 - 1 - -  Feeling bad or failure about yourself  1 - 0 - -  Trouble concentrating 1 - 0 - -  Moving slowly or fidgety/restless 0 - 0 - -  Suicidal thoughts 0 - 0 - -  PHQ-9 Score 13 - 3 - -  Difficult doing work/chores Extremely dIfficult - Not difficult at all - -   GAD 7 : Generalized Anxiety Score 03/29/2019 01/16/2018  Nervous, Anxious, on Edge 3 3  Control/stop worrying 3 3  Worry too much - different things 3 3  Trouble relaxing 3 3  Restless 3 2  Easily annoyed or irritable 3 3  Afraid - awful might happen 3 0  Total GAD 7 Score 21 17  Anxiety Difficulty Extremely difficult Not difficult at all   Anhedonia: no Weight changes: no Insomnia: no   Hypersomnia: no Fatigue/loss of energy: yes Feelings of worthlessness: yes Feelings of guilt: yes Impaired concentration/indecisiveness: yes Suicidal ideations: yes  Crying spells: no Recent Stressors/Life Changes: yes   Relationship problems: no   Family stress: no     Financial stress: no    Job stress: no    Recent death/loss: no  Relevant  past medical, surgical, family and social history reviewed and updated as indicated. Interim medical history since our last visit reviewed. Allergies and medications reviewed and updated.  Review of Systems  Constitutional: Negative.   Cardiovascular: Negative.   Gastrointestinal: Negative.   Musculoskeletal: Negative.   Skin: Negative.   Psychiatric/Behavioral: Positive for agitation, dysphoric mood and sleep disturbance. Negative for behavioral problems, confusion, decreased concentration, hallucinations, self-injury and suicidal ideas. The patient is nervous/anxious. The patient is not hyperactive.     Per HPI unless specifically indicated above     Objective:    Ht 5\' 10"  (1.778 m)   Wt 245 lb (111.1 kg)   BMI 35.15 kg/m   Wt Readings from Last 3 Encounters:  03/29/19 245 lb (111.1 kg)  02/20/19 238 lb (108 kg)  10/10/18 243 lb 13.3 oz (110.6 kg)    Physical Exam Vitals and nursing note reviewed.  Constitutional:      General: He is not in acute distress.    Appearance: Normal appearance. He is not ill-appearing, toxic-appearing or diaphoretic.  HENT:     Head: Normocephalic and atraumatic.     Right Ear: External ear normal.     Left Ear: External ear normal.     Nose: Nose normal.  Mouth/Throat:     Mouth: Mucous membranes are moist.     Pharynx: Oropharynx is clear.  Eyes:     General: No scleral icterus.       Right eye: No discharge.        Left eye: No discharge.     Conjunctiva/sclera: Conjunctivae normal.     Pupils: Pupils are equal, round, and reactive to light.  Pulmonary:     Effort: Pulmonary effort is normal. No respiratory distress.     Comments: Speaking in full sentences Musculoskeletal:        General: Normal range of motion.     Cervical back: Normal range of motion.  Skin:    Coloration: Skin is not jaundiced or pale.     Findings: No bruising, erythema, lesion or rash.  Neurological:     Mental Status: He is alert and oriented to  person, place, and time. Mental status is at baseline.  Psychiatric:        Mood and Affect: Mood normal.        Behavior: Behavior normal.        Thought Content: Thought content normal.        Judgment: Judgment normal.     Results for orders placed or performed in visit on 02/20/19  Compliance Drug Analysis, Ur  Result Value Ref Range   Summary Note       Assessment & Plan:   Problem List Items Addressed This Visit    None    Visit Diagnoses    Adjustment disorder with anxious mood    -  Primary   With quitting smoking. Will start hydroxyzine. Call with any concerns. Continue to monitor.        Follow up plan: Return if symptoms worsen or fail to improve.   . This visit was completed via MyChart due to the restrictions of the COVID-19 pandemic. All issues as above were discussed and addressed. Physical exam was done as above through visual confirmation on MyChart. If it was felt that the patient should be evaluated in the office, they were directed there. The patient verbally consented to this visit. . Location of the patient: home . Location of the provider: work . Those involved with this call:  . Provider: Park Liter, DO . CMA: Lesle Chris, [place] . Front Desk/Registration: Don Perking  . Time spent on call: 15 minutes with patient face to face via video conference. More than 50% of this time was spent in counseling and coordination of care. 23 minutes total spent in review of patient's record and preparation of their chart.

## 2019-04-02 ENCOUNTER — Other Ambulatory Visit: Payer: Self-pay | Admitting: Family Medicine

## 2019-04-02 ENCOUNTER — Telehealth: Payer: Self-pay | Admitting: *Deleted

## 2019-04-02 ENCOUNTER — Telehealth (INDEPENDENT_AMBULATORY_CARE_PROVIDER_SITE_OTHER): Payer: 59 | Admitting: Family Medicine

## 2019-04-02 ENCOUNTER — Encounter: Payer: Self-pay | Admitting: Family Medicine

## 2019-04-02 DIAGNOSIS — Z720 Tobacco use: Secondary | ICD-10-CM | POA: Diagnosis not present

## 2019-04-02 NOTE — Progress Notes (Signed)
There were no vitals taken for this visit.   Subjective:    Patient ID: Tony Cardenas, male    DOB: Nov 22, 1970, 49 y.o.   MRN: [id number]  HPI: Tony Cardenas is a 49 y.o. male  Chief Complaint  Patient presents with  . Nicotine Dependence   Doing well. Down to about 1/2ppd. He is working on continuing to cut down. Anxiety medicine is working well. Feels like he is doing very well. No other concerns or complaints at this time.   Relevant past medical, surgical, family and social history reviewed and updated as indicated. Interim medical history since our last visit reviewed. Allergies and medications reviewed and updated.  Review of Systems  Constitutional: Negative.   Respiratory: Negative.   Cardiovascular: Negative.   Musculoskeletal: Negative.   Psychiatric/Behavioral: Negative.     Per HPI unless specifically indicated above     Objective:    There were no vitals taken for this visit.  Wt Readings from Last 3 Encounters:  03/29/19 245 lb (111.1 kg)  02/20/19 238 lb (108 kg)  10/10/18 243 lb 13.3 oz (110.6 kg)    Physical Exam Vitals and nursing note reviewed.  Constitutional:      General: He is not in acute distress.    Appearance: Normal appearance. He is not ill-appearing, toxic-appearing or diaphoretic.  HENT:     Head: Normocephalic and atraumatic.     Right Ear: External ear normal.     Left Ear: External ear normal.     Nose: Nose normal.     Mouth/Throat:     Mouth: Mucous membranes are moist.     Pharynx: Oropharynx is clear.  Eyes:     General: No scleral icterus.       Right eye: No discharge.        Left eye: No discharge.     Conjunctiva/sclera: Conjunctivae normal.     Pupils: Pupils are equal, round, and reactive to light.  Pulmonary:     Effort: Pulmonary effort is normal. No respiratory distress.     Comments: Speaking in full sentences Musculoskeletal:        General: Normal range of motion.     Cervical back: Normal  range of motion.  Skin:    Coloration: Skin is not jaundiced or pale.     Findings: No bruising, erythema, lesion or rash.  Neurological:     Mental Status: He is alert and oriented to person, place, and time. Mental status is at baseline.  Psychiatric:        Mood and Affect: Mood normal.        Behavior: Behavior normal.        Thought Content: Thought content normal.        Judgment: Judgment normal.     Results for orders placed or performed in visit on 02/20/19  Compliance Drug Analysis, Ur  Result Value Ref Range   Summary Note       Assessment & Plan:   Problem List Items Addressed This Visit    None    Visit Diagnoses    Tobacco abuse    -  Primary   Doing well. Down to 1/2ppd. Continue chantix, Call with any concerns.        Follow up plan: Return if symptoms worsen or fail to improve.    . This visit was completed via MyChart due to the restrictions of the COVID-19 pandemic. All issues as above were discussed and addressed.  Physical exam was done as above through visual confirmation on MyChart. If it was felt that the patient should be evaluated in the office, they were directed there. The patient verbally consented to this visit. . Location of the patient: home . Location of the provider: work . Those involved with this call:  . Provider: Park Liter, DO . CMA: Tiffany Reel, CMA . Front Desk/Registration: Don Perking  . Time spent on call: 10 minutes with patient face to face via video conference. More than 50% of this time was spent in counseling and coordination of care. 15 minutes total spent in review of patient's record and preparation of their chart.

## 2019-04-02 NOTE — Telephone Encounter (Signed)
Patient informed there is a script for Hydrocodone at the pharmacy that can be filled 04-06-19.

## 2019-04-02 NOTE — Telephone Encounter (Signed)
Appt today at 830

## 2019-04-02 NOTE — Telephone Encounter (Signed)
Requested medication (s) are due for refill today: yes  Requested medication (s) are on the active medication list: yes  Last refill: 02/05/19  Future visit scheduled: yes  Notes to clinic:  not delegated    Requested Prescriptions  Pending Prescriptions Disp Refills   methocarbamol (ROBAXIN) 500 MG tablet [Pharmacy Med Name: METHOCARBAMOL 500 MG TABS 500 Tablet] 180 tablet 1    Sig: TAKE 1 TABLET BY MOUTH 4 TIMES DAILY.      Not Delegated - Analgesics:  Muscle Relaxants Failed - 04/02/2019  7:32 AM      Failed - This refill cannot be delegated      Passed - Valid encounter within last 6 months    Recent Outpatient Visits           4 days ago Adjustment disorder with anxious mood   [place], Megan P, DO   1 week ago Tobacco abuse   [facility], [place], DO   3 months ago Essential hypertension   [place], Cherry, DO   6 months ago Essential hypertension   [place], [place], DO   1 year ago Routine general medical examination at a health care facility   [place], Barb Merino, DO       Future Appointments             Today Valerie Roys, DO [facility], [place]   In 2 months Wynetta Emery, Barb Merino, [place], [place]

## 2019-04-09 ENCOUNTER — Other Ambulatory Visit: Payer: Self-pay | Admitting: Family Medicine

## 2019-04-09 NOTE — Telephone Encounter (Signed)
Requested Prescriptions  Pending Prescriptions Disp Refills  . ezetimibe (ZETIA) 10 MG tablet [Pharmacy Med Name: EZETIMIBE 10 MG TABS 10 Tablet] 30 tablet 3    Sig: TAKE 1 TABLET (10 MG TOTAL) BY MOUTH DAILY.     Cardiovascular:  Antilipid - Sterol Transport Inhibitors Failed - 04/09/2019  9:51 AM      Failed - Total Cholesterol in normal range and within 360 days    Cholesterol, Total  Date Value Ref Range Status  12/13/2018 236 (H) 100 - 199 mg/dL Final   Cholesterol Piccolo, Waived  Date Value Ref Range Status  06/22/2015 WILL FOLLOW  Preliminary         Failed - LDL in normal range and within 360 days    LDL Chol Calc (NIH)  Date Value Ref Range Status  12/13/2018 86 0 - 99 mg/dL Final         Failed - HDL in normal range and within 360 days    HDL  Date Value Ref Range Status  12/13/2018 27 (L) >39 mg/dL Final         Failed - Triglycerides in normal range and within 360 days    Triglycerides  Date Value Ref Range Status  12/13/2018 747 (HH) 0 - 149 mg/dL Final   Triglycerides Piccolo,Waived  Date Value Ref Range Status  06/22/2015 WILL FOLLOW  Preliminary         Passed - Valid encounter within last 12 months    Recent Outpatient Visits          1 week ago Tobacco abuse   [place], Megan P, DO   1 week ago Adjustment disorder with anxious mood   [facility] [place], [place], DO   2 weeks ago Tobacco abuse   [facility] [employer], [place], DO   3 months ago Essential hypertension   [place], Leon[place], DO   6 months ago Essential hypertension   [place], [place], DO      Future Appointments            In 2 months Wynetta Emery, Barb Merino, DO [employer], [facility]

## 2019-04-18 ENCOUNTER — Other Ambulatory Visit: Payer: Self-pay | Admitting: Neurosurgery

## 2019-04-18 DIAGNOSIS — M960 Pseudarthrosis after fusion or arthrodesis: Secondary | ICD-10-CM | POA: Diagnosis not present

## 2019-04-18 DIAGNOSIS — G959 Disease of spinal cord, unspecified: Secondary | ICD-10-CM | POA: Diagnosis not present

## 2019-04-18 DIAGNOSIS — Z981 Arthrodesis status: Secondary | ICD-10-CM | POA: Diagnosis not present

## 2019-04-18 DIAGNOSIS — R1310 Dysphagia, unspecified: Secondary | ICD-10-CM | POA: Diagnosis not present

## 2019-04-19 ENCOUNTER — Other Ambulatory Visit
Admission: RE | Admit: 2019-04-19 | Discharge: 2019-04-19 | Disposition: A | Discharge status: Home / Self Care | Payer: 59 | Source: Ambulatory Visit | Attending: Internal Medicine | Admitting: Internal Medicine

## 2019-04-19 DIAGNOSIS — M1990 Unspecified osteoarthritis, unspecified site: Secondary | ICD-10-CM | POA: Diagnosis not present

## 2019-04-19 DIAGNOSIS — D473 Essential (hemorrhagic) thrombocythemia: Secondary | ICD-10-CM | POA: Diagnosis not present

## 2019-04-19 DIAGNOSIS — Z79899 Other long term (current) drug therapy: Secondary | ICD-10-CM | POA: Insufficient documentation

## 2019-04-19 DIAGNOSIS — M4712 Other spondylosis with myelopathy, cervical region: Secondary | ICD-10-CM | POA: Diagnosis not present

## 2019-04-19 DIAGNOSIS — Q761 Klippel-Feil syndrome: Secondary | ICD-10-CM | POA: Diagnosis not present

## 2019-04-19 LAB — CBC WITH DIFFERENTIAL/PLATELET
Abs Immature Granulocytes: 0.08 10*3/uL — ABNORMAL HIGH (ref 0.00–0.07)
Basophils Absolute: 0.1 10*3/uL (ref 0.0–0.1)
Basophils Relative: 1 %
Eosinophils Absolute: 0.2 10*3/uL (ref 0.0–0.5)
Eosinophils Relative: 2 %
HCT: 40.5 % (ref 39.0–52.0)
Hemoglobin: 14.6 g/dL (ref 13.0–17.0)
Immature Granulocytes: 1 %
Lymphocytes Relative: 34 %
Lymphs Abs: 3.5 10*3/uL (ref 0.7–4.0)
MCH: 31.8 pg (ref 26.0–34.0)
MCHC: 36 g/dL (ref 30.0–36.0)
MCV: 88.2 fL (ref 80.0–100.0)
Monocytes Absolute: 0.6 10*3/uL (ref 0.1–1.0)
Monocytes Relative: 6 %
Neutro Abs: 5.7 10*3/uL (ref 1.7–7.7)
Neutrophils Relative %: 56 %
Platelets: 378 10*3/uL (ref 150–400)
RBC: 4.59 MIL/uL (ref 4.22–5.81)
RDW: 13.2 % (ref 11.5–15.5)
WBC: 10.2 10*3/uL (ref 4.0–10.5)
nRBC: 0 % (ref 0.0–0.2)

## 2019-04-19 LAB — ALT: ALT: 22 U/L (ref 0–44)

## 2019-04-19 LAB — CREATININE, SERUM
Creatinine, Ser: 0.72 mg/dL (ref 0.61–1.24)
GFR calc Af Amer: >60 mL/min (ref >60)
GFR calc non Af Amer: >60 mL/min (ref >60)

## 2019-04-19 LAB — AST: AST: 23 U/L (ref 15–41)

## 2019-04-26 ENCOUNTER — Other Ambulatory Visit: Payer: Self-pay

## 2019-04-26 ENCOUNTER — Encounter
Admission: RE | Admit: 2019-04-26 | Discharge: 2019-04-26 | Disposition: A | Discharge status: Home / Self Care | Payer: 59 | Source: Ambulatory Visit | Attending: Neurosurgery | Admitting: Neurosurgery

## 2019-04-26 HISTORY — DX: Essential (primary) hypertension: I10

## 2019-04-26 NOTE — Patient Instructions (Addendum)
Your procedure is scheduled on:  Wednesday, May 01, 2019 Report to Day Surgery on the 2nd floor of the [facility]. To find out your arrival time, please call [phone] between 1PM - 3PM on: Tuesday, April 20  REMEMBER: Instructions that are not followed completely may result in serious medical risk, up to and including death; or upon the discretion of your surgeon and anesthesiologist your surgery may need to be rescheduled.  Do not eat food after midnight the night before surgery.  No gum chewing, lozengers or hard candies.  You may however, drink water up to 2 hours before you are scheduled to arrive for your surgery. Do not drink anything within 2 hours of the start of your surgery.  TAKE THESE MEDICATIONS THE MORNING OF SURGERY WITH A SIP OF WATER:  1.  Albuterol inhaler 2.  Hydrocodone if needed for pain  Use inhalers on the day of surgery and bring to the hospital.  Stop Metformin 2 days prior to surgery.  Last day to take is Sunday, April 18; resume after surgery.  Follow recommendations from Cardiologist, Pulmonologist or PCP regarding stopping Aspirin, Coumadin, Plavix, Eliquis, Pradaxa, or Pletal.  Stop Anti-inflammatories (NSAIDS) such as Advil, Aleve, Ibuprofen, Motrin, Naproxen, Naprosyn and Aspirin based products such as Excedrin, Goodys Powder, BC Powder. (May take Tylenol or Acetaminophen if needed.)  Stop ANY OVER THE COUNTER supplements until after surgery. (May continue Vitamin D.)  No Alcohol for 24 hours before or after surgery.  No Smoking including e-cigarettes for 24 hours prior to surgery.  No chewable tobacco products for at least 6 hours prior to surgery.  No nicotine patches on the day of surgery.  On the morning of surgery brush your teeth with toothpaste and water, you may rinse your mouth with mouthwash if you wish. Do not swallow any toothpaste or mouthwash.  Do not wear jewelry.  Do not wear lotions, powders, or perfumes.   Do not  shave 48 hours prior to surgery.   Do not bring valuables to the hospital, including drivers license, insurance or credit cards.  [facility] is not responsible for any belongings or valuables.   Use CHG Soap as directed on instruction sheet.  Notify your doctor if there is any change in your medical condition (cold, fever, infection).  Wear comfortable clothing (specific to your surgery type) to the hospital.  Plan for stool softeners for home use.  If you are being admitted to the hospital overnight, leave your suitcase in the car. After surgery it may be brought to your room.  If you are being discharged the day of surgery, you will not be allowed to drive home. You will need a responsible adult to drive you home and stay with you that night.   Please call [phone] if you have any questions about these instructions.  Visitation Policy:  Patients undergoing a surgery or procedure in a hospital may have one family member or support person with them as long as that person is not COVID-19 positive or experiencing its symptoms. That person may remain in the waiting area during the procedure. Should the patient need to stay at the hospital during part of their recovery, the support person may visit during visiting hours; 7 am to 8 pm.  Inpatient Visitation Update:  Two designated support people may visit a patient during visiting hours 7 am to 8 pm. It must be the same two designated people for the duration of the patient stay. The visitors  may come and go during the day, and there is no switching out to have different visitors. A mask must be worn at all times, including in the patient room.

## 2019-04-29 ENCOUNTER — Other Ambulatory Visit: Payer: Self-pay

## 2019-04-29 ENCOUNTER — Other Ambulatory Visit: Admission: RE | Admit: 2019-04-29 | Payer: 59 | Source: Ambulatory Visit

## 2019-04-29 ENCOUNTER — Other Ambulatory Visit
Admission: RE | Admit: 2019-04-29 | Discharge: 2019-04-29 | Disposition: A | Discharge status: Home / Self Care | Payer: 59 | Source: Ambulatory Visit | Attending: Neurosurgery | Admitting: Neurosurgery

## 2019-04-29 DIAGNOSIS — E1159 Type 2 diabetes mellitus with other circulatory complications: Secondary | ICD-10-CM | POA: Diagnosis not present

## 2019-04-29 DIAGNOSIS — I10 Essential (primary) hypertension: Secondary | ICD-10-CM | POA: Diagnosis not present

## 2019-04-29 DIAGNOSIS — Z981 Arthrodesis status: Secondary | ICD-10-CM | POA: Diagnosis not present

## 2019-04-29 DIAGNOSIS — J449 Chronic obstructive pulmonary disease, unspecified: Secondary | ICD-10-CM | POA: Diagnosis not present

## 2019-04-29 DIAGNOSIS — M5412 Radiculopathy, cervical region: Secondary | ICD-10-CM | POA: Diagnosis not present

## 2019-04-29 DIAGNOSIS — Q761 Klippel-Feil syndrome: Secondary | ICD-10-CM | POA: Diagnosis not present

## 2019-04-29 DIAGNOSIS — E785 Hyperlipidemia, unspecified: Secondary | ICD-10-CM | POA: Diagnosis not present

## 2019-04-29 DIAGNOSIS — M960 Pseudarthrosis after fusion or arthrodesis: Secondary | ICD-10-CM | POA: Diagnosis not present

## 2019-04-29 DIAGNOSIS — R1310 Dysphagia, unspecified: Secondary | ICD-10-CM | POA: Diagnosis not present

## 2019-04-29 DIAGNOSIS — G894 Chronic pain syndrome: Secondary | ICD-10-CM | POA: Diagnosis not present

## 2019-04-29 DIAGNOSIS — E119 Type 2 diabetes mellitus without complications: Secondary | ICD-10-CM | POA: Diagnosis not present

## 2019-04-29 DIAGNOSIS — Z20822 Contact with and (suspected) exposure to covid-19: Secondary | ICD-10-CM | POA: Diagnosis not present

## 2019-04-29 DIAGNOSIS — K219 Gastro-esophageal reflux disease without esophagitis: Secondary | ICD-10-CM | POA: Diagnosis not present

## 2019-04-29 DIAGNOSIS — M1990 Unspecified osteoarthritis, unspecified site: Secondary | ICD-10-CM | POA: Diagnosis not present

## 2019-04-29 LAB — SARS CORONAVIRUS 2 (TAT 6-24 HRS): SARS Coronavirus 2: NEGATIVE

## 2019-04-29 LAB — BASIC METABOLIC PANEL
Anion gap: 12 (ref 5–15)
BUN: 10 mg/dL (ref 6–20)
CO2: 20 mmol/L — ABNORMAL LOW (ref 22–32)
Calcium: 8.5 mg/dL — ABNORMAL LOW (ref 8.9–10.3)
Chloride: 102 mmol/L (ref 98–111)
Creatinine, Ser: 0.63 mg/dL (ref 0.61–1.24)
GFR calc Af Amer: >60 mL/min (ref >60)
GFR calc non Af Amer: >60 mL/min (ref >60)
Glucose, Bld: 249 mg/dL — ABNORMAL HIGH (ref 70–99)
Potassium: 3.5 mmol/L (ref 3.5–5.1)
Sodium: 134 mmol/L — ABNORMAL LOW (ref 135–145)

## 2019-04-29 LAB — URINALYSIS, ROUTINE W REFLEX MICROSCOPIC
Bacteria, UA: NONE SEEN
Bilirubin Urine: NEGATIVE
Glucose, UA: >=500 mg/dL — AB
Hgb urine dipstick: NEGATIVE
Ketones, ur: NEGATIVE mg/dL
Leukocytes,Ua: NEGATIVE
Nitrite: NEGATIVE
Protein, ur: NEGATIVE mg/dL
Specific Gravity, Urine: 1.03 (ref 1.005–1.030)
Squamous Epithelial / HPF: NONE SEEN (ref 0–5)
WBC, UA: NONE SEEN WBC/hpf (ref 0–5)
pH: 5 (ref 5.0–8.0)

## 2019-04-29 LAB — CBC
HCT: 40.6 % (ref 39.0–52.0)
Hemoglobin: 14.6 g/dL (ref 13.0–17.0)
MCH: 32.2 pg (ref 26.0–34.0)
MCHC: 36 g/dL (ref 30.0–36.0)
MCV: 89.6 fL (ref 80.0–100.0)
Platelets: 401 10*3/uL — ABNORMAL HIGH (ref 150–400)
RBC: 4.53 MIL/uL (ref 4.22–5.81)
RDW: 13 % (ref 11.5–15.5)
WBC: 9.5 10*3/uL (ref 4.0–10.5)
nRBC: 0 % (ref 0.0–0.2)

## 2019-04-29 LAB — TYPE AND SCREEN
ABO/RH(D): A POS
Antibody Screen: NEGATIVE

## 2019-04-29 LAB — PROTIME-INR
INR: 1 (ref 0.8–1.2)
Prothrombin Time: 12.9 seconds (ref 11.4–15.2)

## 2019-04-29 LAB — APTT: aPTT: 40 seconds — ABNORMAL HIGH (ref 24–36)

## 2019-04-30 ENCOUNTER — Encounter: Payer: Self-pay | Admitting: Student in an Organized Health Care Education/Training Program

## 2019-04-30 ENCOUNTER — Ambulatory Visit (HOSPITAL_BASED_OUTPATIENT_CLINIC_OR_DEPARTMENT_OTHER): Payer: 59 | Admitting: Student in an Organized Health Care Education/Training Program

## 2019-04-30 VITALS — BP 125/71 | HR 90 | Temp 97.9°F | Resp 18 | Ht 71.0 in | Wt 255.0 lb

## 2019-04-30 DIAGNOSIS — G894 Chronic pain syndrome: Secondary | ICD-10-CM

## 2019-04-30 DIAGNOSIS — M5412 Radiculopathy, cervical region: Secondary | ICD-10-CM

## 2019-04-30 DIAGNOSIS — Z981 Arthrodesis status: Secondary | ICD-10-CM

## 2019-04-30 DIAGNOSIS — Q761 Klippel-Feil syndrome: Secondary | ICD-10-CM | POA: Diagnosis not present

## 2019-04-30 MED ORDER — HYDROCODONE-ACETAMINOPHEN 5-325 MG PO TABS: 1.0000 | ORAL_TABLET | Freq: Four times a day (QID) | ORAL | 0 refills | Status: DC | PRN

## 2019-04-30 MED ORDER — PREGABALIN 50 MG PO CAPS: 50.0000 mg | ORAL_CAPSULE | Freq: Every day | ORAL | 2 refills | Status: DC

## 2019-04-30 MED ORDER — HYDROCODONE-ACETAMINOPHEN 5-325 MG PO TABS: 1.0000 | ORAL_TABLET | Freq: Four times a day (QID) | ORAL | 0 refills | Status: AC | PRN

## 2019-04-30 NOTE — Progress Notes (Signed)
PROVIDER NOTE: Information contained herein reflects review and annotations entered in association with encounter. Interpretation of such information and data should be left to medically-trained personnel. Information provided to patient can be located elsewhere in the medical record under "Patient Instructions". Document created using STT-dictation technology, any transcriptional errors that may result from process are unintentional.    Patient: Tony Cardenas  Service Category: E/M  Provider: Gillis Santa, MD  DOB: 07-25-1970  DOS: 04/30/2019  Referring Provider: Valerie Roys DO  MRN: [id number]  Setting: Ambulatory outpatient  PCP: Valerie Roys, DO  Type: Established Patient  Specialty: Interventional Pain Management    Location: Office  Delivery: Face-to-face     Primary Reason(s) for Visit: Encounter for prescription drug management. (Level of risk: moderate)  CC: Neck Pain  HPI  Tony Cardenas is a 49 y.o. year old, male patient, who comes today for a medication management evaluation. He has Calculus of kidney; GERD (gastroesophageal reflux disease); COPD (chronic obstructive pulmonary disease) ([place]); Chronic inflammatory arthritis; Type 2 diabetes mellitus with hyperglycemia ([place]); Hyperlipidemia; Diastasis recti; HTN (hypertension); Abnormal TSH; Encounter for long-term (current) use of high-risk medication; Polyarthralgia; Thrombocytosis ([place]); Vitamin D deficiency; Weight loss; S/P cervical spinal fusion; Cervical radicular pain; Cervical fusion syndrome; Chronic pain syndrome; Cervical spondylosis with myelopathy and radiculopathy; Evaluation by psychiatric service required; and Tobacco use disorder on their problem list. His primarily concern today is the Neck Pain  Pain Assessment: Location:   Neck Radiating: both arms with numbness in fingers Onset: More than a month ago Duration: Chronic pain Quality: Dull, Burning, Sharp, Nagging Severity: 8 /10 (subjective, self-reported  pain score)  Note: Reported level is compatible with observation. Effect on ADL:   Timing: Constant Modifying factors: Hydrocodone BP: 125/71  HR: 90  Tony Cardenas was last scheduled for an appointment on 03/07/2019 for medication management. During today's appointment we reviewed Tony Cardenas's chronic pain status, as well as his outpatient medication regimen.  Patient is scheduled for an anterior cervical corpectomy at C4 tomorrow with Dr. Cari Caraway.  He is here for medication management.  I will increase the patient's baseline hydrocodone from 5 mg twice daily to 5 mg every 6 hours as needed.  I have instructed the patient to take whenever Dr. Cari Caraway surgical team prescribes for acute postoperative pain on top of what I have prescribed for his chronic pain.  Patient states that he has stopped smoking for his surgery.  I commended him on this.  He feels that he has more energy and also more money as a result of not smoking.  He is utilizing Robaxin and Lyrica as prescribed.  I wished him luck with his surgery.  He will follow up with me in 2 months.  The patient  reports no history of drug use. His body mass index is 35.57 kg/m.  Further details on both, my assessment(s), as well as the proposed treatment plan, please see below.  Controlled Substance Pharmacotherapy Assessment REMS (Risk Evaluation and Mitigation Strategy)  Analgesic: Norco 5 mg twice daily as needed, will increase to 5 mg 4 times daily as needed given upcoming surgery.  Tony Martins, RN  04/30/2019  9:31 AM  Sign when Signing Visit Nursing Pain Medication Assessment:  Safety precautions to be maintained throughout the outpatient stay will include: orient to surroundings, keep bed in low position, maintain call bell within reach at all times, provide assistance with transfer out of bed and ambulation.  Medication Inspection Compliance: Tony Cardenas did not  comply with our request to bring his pills to be counted. He  was reminded that bringing the medication bottles, even when empty, is a requirement.  Medication: None brought in. Pill/Patch Count: None available to be counted. Bottle Appearance: No container available. Did not bring bottle(s) to appointment. Filled Date: N/A Last Medication intake:  Today   Pharmacokinetics: Liberation and absorption (onset of action): WNL Distribution (time to peak effect): WNL Metabolism and excretion (duration of action): WNL         Pharmacodynamics: Desired effects: Analgesia: Tony Cardenas reports >50% benefit. Functional ability: Patient reports that medication allows him to accomplish basic ADLs Clinically meaningful improvement in function (CMIF): Sustained CMIF goals met Perceived effectiveness: Described as relatively effective, allowing for increase in activities of daily living (ADL) Undesirable effects: Side-effects or Adverse reactions: None reported Monitoring: [place] PMP: PDMP not reviewed this encounter. Online review of the past 59-monthperiod conducted. Compliant with practice rules and regulations Last UDS on record: Summary  Date Value Ref Range Status  02/20/2019 Note  Final    Comment:    ==================================================================== Compliance Drug Analysis, Ur ==================================================================== Test                             Result       Flag       Units Drug Present and Declared for Prescription Verification   Hydrocodone                    761          EXPECTED   ng/mg creat   Hydromorphone                  480          EXPECTED   ng/mg creat   Dihydrocodeine                 100          EXPECTED   ng/mg creat   Norhydrocodone                 402          EXPECTED   ng/mg creat    Sources of hydrocodone include scheduled prescription medications.    Hydromorphone, dihydrocodeine and norhydrocodone are expected    metabolites of hydrocodone. Hydromorphone and dihydrocodeine  are    also available as scheduled prescription medications.   Methocarbamol                  PRESENT      EXPECTED   Acetaminophen                  PRESENT      EXPECTED Drug Absent but Declared for Prescription Verification   Pregabalin                     Not Detected UNEXPECTED ==================================================================== Test                      Result    Flag   Units      Ref Range   Creatinine              61               mg/dL      >=20 ==================================================================== Declared Medications:  The flagging and interpretation on this report are based on  the  following declared medications.  Unexpected results may arise from  inaccuracies in the declared medications.  **Note: The testing scope of this panel includes these medications:  Hydrocodone (Norco)  Methocarbamol (Robaxin)  Pregabalin (Lyrica)  **Note: The testing scope of this panel does not include small to  moderate amounts of these reported medications:  Acetaminophen (Norco)  **Note: The testing scope of this panel does not include the  following reported medications:  Albuterol  Ezetimibe (Zetia)  Fluticasone (Flonase)  Folic Acid  Lisinopril (Zestril)  Metformin (Glucophage)  Methotrexate  Vitamin D2 (Drisdol) ==================================================================== For clinical consultation, please call [phone]. ====================================================================    UDS interpretation: Compliant          Medication Assessment Form: Reviewed. Patient indicates being compliant with therapy Treatment compliance: Compliant Risk Assessment Profile: Aberrant behavior: See initial evaluations. None observed or detected today Comorbid factors increasing risk of overdose: See initial evaluation. No additional risks detected today Opioid risk tool (ORT):  Opioid Risk  02/20/2019  Alcohol 0  Illegal Drugs 0  Rx Drugs  0  Alcohol 0  Illegal Drugs 0  Rx Drugs 0  Age between 16-45 years  0  History of Preadolescent Sexual Abuse 0  Psychological Disease 0  Depression 0  Opioid Risk Tool Scoring 0  Opioid Risk Interpretation Low Risk    ORT Scoring interpretation table:  Score <3 = Low Risk for SUD  Score between 4-7 = Moderate Risk for SUD  Score >8 = High Risk for Opioid Abuse   Risk of substance use disorder (SUD): Low  Risk Mitigation Strategies:  Patient Counseling: Covered Patient-Prescriber Agreement (PPA): Present and active  Notification to other healthcare providers: Done  Pharmacologic Plan: Increase to 5 mg 4 times daily as needed given C4 corpectomy scheduled for tomorrow.  We will likely wean back down to 5 mg twice daily at next follow-up visit.             Laboratory Chemistry Profile   Renal Lab Results  Component Value Date   BUN 10 04/29/2019   CREATININE 0.63 04/29/2019   BCR 14 12/13/2018   GFRAA >60 04/29/2019   GFRNONAA >60 04/29/2019   SPECGRAV 1.020 12/13/2018   PHUR 5.0 12/13/2018   PROTEINUR NEGATIVE 04/29/2019     Electrolytes Lab Results  Component Value Date   NA 134 (L) 04/29/2019   K 3.5 04/29/2019   CL 102 04/29/2019   CALCIUM 8.5 (L) 04/29/2019     Hepatic Lab Results  Component Value Date   AST 23 04/19/2019   ALT 22 04/19/2019   ALBUMIN 4.6 12/13/2018   ALKPHOS 78 12/13/2018     ID Lab Results  Component Value Date   LYMEIGGIGMAB <0.91 04/07/2017   HIV Non Reactive 06/22/2016   SARSCOV2NAA NEGATIVE 04/29/2019   STAPHAUREUS NEGATIVE 10/05/2018   MRSAPCR NEGATIVE 10/05/2018   RMSFIGG Negative 04/07/2017     Bone Lab Results  Component Value Date   VD25OH 29.0 (L) 12/13/2018     Endocrine Lab Results  Component Value Date   GLUCOSE 249 (H) 04/29/2019   GLUCOSEU >=500 (A) 04/29/2019   HGBA1C 6.5 12/13/2018   TSH 0.752 12/13/2018     Neuropathy Lab Results  Component Value Date   HGBA1C 6.5 12/13/2018   HIV Non Reactive  06/22/2016     CNS No results found for: COLORCSF, APPEARCSF, RBCCOUNTCSF, WBCCSF, POLYSCSF, LYMPHSCSF, EOSCSF, PROTEINCSF, GLUCCSF, JCVIRUS, CSFOLI, IGGCSF, LABACHR, ACETBL, LABACHR, ACETBL   Inflammation (CRP: Acute  ESR:  Chronic) Lab Results  Component Value Date   CRP 2.9 (H) 03/15/2018   ESRSEDRATE 8 11/15/2017     Rheumatology Lab Results  Component Value Date   RF 11.6 04/07/2017   ANA Negative 04/24/2017   LABURIC 3.5 (L) 12/20/2016   LYMEIGGIGMAB <0.91 04/07/2017   LYMEABIGMQN <0.80 04/07/2017     Coagulation Lab Results  Component Value Date   INR 1.0 04/29/2019   LABPROT 12.9 04/29/2019   APTT 40 (H) 04/29/2019   PLT 401 (H) 04/29/2019     Cardiovascular Lab Results  Component Value Date   CKTOTAL 33 04/07/2017   CKMB 2.2 06/01/2017   HGB 14.6 04/29/2019   HCT 40.6 04/29/2019     Screening Lab Results  Component Value Date   SARSCOV2NAA NEGATIVE 04/29/2019   STAPHAUREUS NEGATIVE 10/05/2018   MRSAPCR NEGATIVE 10/05/2018   HIV Non Reactive 06/22/2016     Cancer No results found for: CEA, CA125, LABCA2   Allergens No results found for: ALMOND, APPLE, ASPARAGUS, AVOCADO, BANANA, BARLEY, BASIL, BAYLEAF, GREENBEAN, LIMABEAN, WHITEBEAN, BEEFIGE, REDBEET, BLUEBERRY, BROCCOLI, CABBAGE, MELON, CARROT, CASEIN, CASHEWNUT, CAULIFLOWER, CELERY     Note: Lab results reviewed.   Recent Diagnostic Imaging Results  CT CERVICAL SPINE WO CONTRAST CLINICAL DATA:  History of multiple cervical fusions, latest 6 months ago. Trouble swallowing. Evaluate for hardware loosening.  EXAM: CT CERVICAL SPINE WITHOUT CONTRAST  TECHNIQUE: Multidetector CT imaging of the cervical spine was performed without intravenous contrast. Multiplanar CT image reconstructions were also generated.  COMPARISON:  12/24/2006.  Intraoperative fluoro, 10/10/2018.  FINDINGS: Alignment: No subluxation/spondylolisthesis.  Skull base and vertebrae: No fracture or bone lesion.  Anterior  fusion plate spans I7-P8. Interbody fusion device extends from the lower endplate of C3 to the upper endplate of C5. The anterior fusion plate lies 5 mm anterior to the anterior margin of the C3 vertebra, closely approximated to the anterior margin of the C5 vertebra. This appearance is stable from the operative exam is. No lucency noted adjacent to screws to suggest loosening.  Previous mature bony fusion at C5-C6.  Wire encircles the posterior elements C1 and C2. There is additional bony fragment between the spinous processes C1 and C2, with these findings stable from the prior CT.  Minor loss of disc height and endplate spurring at E4-M3. No significant disc bulging and no disc herniation.  Mild facet degenerative change on the right at C2-C3 and on the left at C4-C5.  Soft tissues and spinal canal: No prevertebral fluid or swelling. No evidence of a spinal canal mass or hematoma.  Upper chest: No acute findings.  Lung apices are clear.  Other: None.  IMPRESSION: 1. No acute findings. No fracture. No evidence of loosening of the orthopedic hardware fusing C3-C5 or encircling the posterior elements at C1-C2. 2. No spondylolisthesis/subluxation.  Electronically Signed   By: Lajean Manes M.D.   On: 03/28/2019 16:42  Complexity Note: Imaging results reviewed. Results shared with Mr. Crite, using Layman's terms.                               Meds   Current Outpatient Medications:  .  albuterol (VENTOLIN HFA) 108 (90 Base) MCG/ACT inhaler, Inhale 2 puffs into the lungs every 6 (six) hours as needed for wheezing or shortness of breath., Disp: 3 g, Rfl: 3 .  blood glucose meter kit and supplies KIT, Dispense based on patient and insurance preference. Use up to four  times daily as directed. (FOR ICD-9 250.00, 250.01)., Disp: 1 each, Rfl: 0 .  ezetimibe (ZETIA) 10 MG tablet, TAKE 1 TABLET (10 MG TOTAL) BY MOUTH DAILY., Disp: 30 tablet, Rfl: 3 .  fluticasone (FLONASE) 50  MCG/ACT nasal spray, PLACE 2 SPRAYS INTO BOTH NOSTRILS DAILY IN THE EVENING, Disp: 16 g, Rfl: 12 .  folic acid (FOLVITE) 1 MG tablet, Take 1 mg by mouth daily. , Disp: , Rfl:  .  HYDROcodone-acetaminophen (NORCO/VICODIN) 5-325 MG tablet, Take 1 tablet by mouth every 6 (six) hours as needed for severe pain. Must last 30 days., Disp: 120 tablet, Rfl: 0 .  [START ON 05/30/2019] HYDROcodone-acetaminophen (NORCO/VICODIN) 5-325 MG tablet, Take 1 tablet by mouth every 6 (six) hours as needed for severe pain. Must last 30 days., Disp: 120 tablet, Rfl: 0 .  hydrOXYzine (ATARAX/VISTARIL) 25 MG tablet, Take 1 pill every 8 hours as needed for anxiety, can take 2 at bedtime to help sleep, don't take more than 5 a day, Disp: 120 tablet, Rfl: 3 .  lisinopril (ZESTRIL) 5 MG tablet, Take 1 tablet (5 mg total) by mouth daily., Disp: 90 tablet, Rfl: 1 .  metFORMIN (GLUCOPHAGE-XR) 500 MG 24 hr tablet, Take 2 tablets (1,000 mg total) by mouth 2 (two) times daily., Disp: 360 tablet, Rfl: 1 .  methocarbamol (ROBAXIN) 500 MG tablet, TAKE 1 TABLET BY MOUTH 4 TIMES DAILY. (Patient taking differently: Take 500 mg by mouth every 8 (eight) hours as needed for muscle spasms. ), Disp: 180 tablet, Rfl: 1 .  methotrexate (RHEUMATREX) 2.5 MG tablet, Take 5 mg by mouth once a week. Thursday., Disp: , Rfl:  .  pregabalin (LYRICA) 50 MG capsule, Take 1-2 capsules (50-100 mg total) by mouth at bedtime., Disp: 60 capsule, Rfl: 2 .  Vitamin D, Ergocalciferol, (DRISDOL) 50000 units CAPS capsule, Take 50,000 Units by mouth every 7 (seven) days. Mondays, Disp: , Rfl:   ROS  Constitutional: Denies any fever or chills Gastrointestinal: No reported hemesis, hematochezia, vomiting, or acute GI distress Musculoskeletal: Denies any acute onset joint swelling, redness, loss of ROM, or weakness Neurological: No reported episodes of acute onset apraxia, aphasia, dysarthria, agnosia, amnesia, paralysis, loss of coordination, or loss of  consciousness  Allergies  Mr. Shew is allergic to atorvastatin and hydromorphone hcl.  PFSH  Drug: Mr. Caseres  reports no history of drug use. Alcohol:  reports no history of alcohol use. Tobacco:  reports that he quit smoking about 6 weeks ago. His smoking use included cigarettes. He has a 10.00 pack-year smoking history. He quit smokeless tobacco use about 3 years ago.  His smokeless tobacco use included chew. Medical:  has a past medical history of Arthritis, COPD (chronic obstructive pulmonary disease) ([place]), Diabetes mellitus without complication ([place]), GERD (gastroesophageal reflux disease), Hyperlipidemia, Hypertension, and Neck pain (1989). Surgical: Mr. Lovejoy  has a past surgical history that includes Neck surgery (1989); Knee surgery (Right); Colonoscopy (N/A, 06/02/2014); Polypectomy (06/02/2014); Appendectomy; Esophagogastroduodenoscopy (egd) with propofol (N/A, 07/29/2016); Back surgery; Anterior cervical corpectomy (N/A, 10/10/2018); and Hernia repair (over 10 years ago). Family: family history includes Alcohol abuse in his father; COPD in his father; Cancer in his mother; Diabetes in his brother, father, and paternal grandmother.  Constitutional Exam  General appearance: Well nourished, well developed, and well hydrated. In no apparent acute distress Vitals:   04/30/19 0926  BP: 125/71  Pulse: 90  Resp: 18  Temp: 97.9 F (36.6 C)  TempSrc: Temporal  SpO2: 98%  Weight: 255 lb (115.7  kg)  Height: 5' 11"  (1.803 m)   BMI Assessment: Estimated body mass index is 35.57 kg/m as calculated from the following:   Height as of this encounter: 5' 11"  (1.803 m).   Weight as of this encounter: 255 lb (115.7 kg).  BMI interpretation table: BMI level Category Range association with higher incidence of chronic pain  <18 kg/m2 Underweight   18.5-24.9 kg/m2 Ideal body weight   25-29.9 kg/m2 Overweight Increased incidence by 20%  30-34.9 kg/m2 Obese (Class I) Increased incidence  by 68%  35-39.9 kg/m2 Severe obesity (Class II) Increased incidence by 136%  >40 kg/m2 Extreme obesity (Class III) Increased incidence by 254%   Patient's current BMI Ideal Body weight  Body mass index is 35.57 kg/m. Ideal body weight: 75.3 kg (166 lb 0.1 oz) Adjusted ideal body weight: 91.4 kg (201 lb 9.7 oz)   BMI Readings from Last 4 Encounters:  04/30/19 35.57 kg/m  04/26/19 34.17 kg/m  03/29/19 35.15 kg/m  02/20/19 33.19 kg/m   Wt Readings from Last 4 Encounters:  04/30/19 255 lb (115.7 kg)  04/26/19 245 lb (111.1 kg)  03/29/19 245 lb (111.1 kg)  02/20/19 238 lb (108 kg)    Psych/Mental status: Alert, oriented x 3 (person, place, & time)       Eyes: PERLA Respiratory: No evidence of acute respiratory distress  Cervical Spine Exam  Skin & Axial Inspection: Well healed scar from previous spine surgery detected Alignment: Symmetrical Functional ROM: Decreased ROM, bilaterally Stability: No instability detected Muscle Tone/Strength: Functionally intact. No obvious neuro-muscular anomalies detected. Sensory (Neurological): Dermatomal pain pattern Palpation: No palpable anomalies              Upper Extremity (UE) Exam    Side: Right upper extremity  Side: Left upper extremity  Skin & Extremity Inspection: Skin color, temperature, and hair growth are WNL. No peripheral edema or cyanosis. No masses, redness, swelling, asymmetry, or associated skin lesions. No contractures.  Skin & Extremity Inspection: Skin color, temperature, and hair growth are WNL. No peripheral edema or cyanosis. No masses, redness, swelling, asymmetry, or associated skin lesions. No contractures.  Functional ROM: Unrestricted ROM          Functional ROM: Unrestricted ROM          Muscle Tone/Strength: Functionally intact. No obvious neuro-muscular anomalies detected.  Muscle Tone/Strength: Functionally intact. No obvious neuro-muscular anomalies detected.  Sensory (Neurological): Dermatomal pain pattern  Bicep, brachioradialis  Sensory (Neurological): Dermatomal pain pattern          Palpation: No palpable anomalies              Palpation: No palpable anomalies              Provocative Test(s):  Phalen's test: deferred Tinel's test: deferred Apley's scratch test (touch opposite shoulder):  Action 1 (Across chest): Decreased ROM Action 2 (Overhead): Decreased ROM Action 3 (LB reach): Decreased ROM   Provocative Test(s):  Phalen's test: deferred Tinel's test: deferred Apley's scratch test (touch opposite shoulder):  Action 1 (Across chest): Decreased ROM Action 2 (Overhead): Decreased ROM Action 3 (LB reach): Decreased ROM    Thoracic Spine Area Exam  Skin & Axial Inspection: No masses, redness, or swelling Alignment: Symmetrical Functional ROM: Unrestricted ROM Stability: No instability detected Muscle Tone/Strength: Functionally intact. No obvious neuro-muscular anomalies detected. Sensory (Neurological): Unimpaired Muscle strength & Tone: No palpable anomalies  Lumbar Exam  Skin & Axial Inspection: No masses, redness, or swelling Alignment: Symmetrical Functional ROM: Unrestricted  ROM       Stability: No instability detected Muscle Tone/Strength: Functionally intact. No obvious neuro-muscular anomalies detected. Sensory (Neurological): Unimpaired Palpation: No palpable anomalies       Provocative Tests: Hyperextension/rotation test: deferred today       Lumbar quadrant test (Kemp's test): deferred today       Lateral bending test: deferred today       Patrick's Maneuver: deferred today                   FABER* test: deferred today                   S-I anterior distraction/compression test: deferred today         S-I lateral compression test: deferred today         S-I Thigh-thrust test: deferred today         S-I Gaenslen's test: deferred today         *(Flexion, ABduction and External Rotation)  Gait & Posture Assessment  Ambulation: Unassisted Gait: Relatively  normal for age and body habitus Posture: WNL   Lower Extremity Exam    Side: Right lower extremity  Side: Left lower extremity  Stability: No instability observed          Stability: No instability observed          Skin & Extremity Inspection: Skin color, temperature, and hair growth are WNL. No peripheral edema or cyanosis. No masses, redness, swelling, asymmetry, or associated skin lesions. No contractures.  Skin & Extremity Inspection: Skin color, temperature, and hair growth are WNL. No peripheral edema or cyanosis. No masses, redness, swelling, asymmetry, or associated skin lesions. No contractures.  Functional ROM: Unrestricted ROM                  Functional ROM: Unrestricted ROM                  Muscle Tone/Strength: Functionally intact. No obvious neuro-muscular anomalies detected.  Muscle Tone/Strength: Functionally intact. No obvious neuro-muscular anomalies detected.  Sensory (Neurological): Unimpaired        Sensory (Neurological): Unimpaired        DTR: Patellar: deferred today Achilles: deferred today Plantar: deferred today  DTR: Patellar: deferred today Achilles: deferred today Plantar: deferred today  Palpation: No palpable anomalies  Palpation: No palpable anomalies   Assessment   Status Diagnosis  Persistent Persistent Controlled 1. Cervical fusion syndrome   2. Cervical radicular pain   3. S/P cervical spinal fusion   4. Chronic pain syndrome      1.  Increase hydrocodone from 5 mg twice daily as needed to 4 times daily as needed.  Okay to take what ever Dr. Nelly Laurence team prescribes for acute postoperative pain on top of his chronic pain regimen. 2.  Refill Lyrica as below. 3.  Continue Robaxin as prescribed.  No refills needed.  Plan of Care  Pharmacotherapy (Medications Ordered): Meds ordered this encounter  Medications  . pregabalin (LYRICA) 50 MG capsule    Sig: Take 1-2 capsules (50-100 mg total) by mouth at bedtime.    Dispense:  60 capsule     Refill:  2  . HYDROcodone-acetaminophen (NORCO/VICODIN) 5-325 MG tablet    Sig: Take 1 tablet by mouth every 6 (six) hours as needed for severe pain. Must last 30 days.    Dispense:  120 tablet    Refill:  0    Chronic Pain. (STOP Act - Not applicable).  Fill one day early if closed on scheduled refill date.  Marland Kitchen HYDROcodone-acetaminophen (NORCO/VICODIN) 5-325 MG tablet    Sig: Take 1 tablet by mouth every 6 (six) hours as needed for severe pain. Must last 30 days.    Dispense:  120 tablet    Refill:  0    Chronic Pain. (STOP Act - Not applicable). Fill one day early if closed on scheduled refill date.   Planned follow-up:   Return in about 8 weeks (around 06/25/2019) for Medication Management, in person.     Recent Visits Date Type Provider Dept  03/07/19 Office Visit Gillis Santa, MD [facility]  02/20/19 Office Visit Gillis Santa, MD [facility]  Showing recent visits within past 90 days and meeting all other requirements   Today's Visits Date Type Provider Dept  04/30/19 Office Visit Gillis Santa, MD [facility] [facility]  Showing today's visits and meeting all other requirements   Future Appointments Date Type Provider Dept  06/25/19 Appointment Gillis Santa, MD [facility]  Showing future appointments within next 90 days and meeting all other requirements   Primary Care Physician: Valerie Roys, DO Location: [facility] Note by: Gillis Santa, MD Date: 04/30/2019; Time: 10:47 AM  Note: This dictation was prepared with Dragon dictation. Any transcriptional errors that may result from this process are unintentional.

## 2019-04-30 NOTE — Progress Notes (Signed)
Nursing Pain Medication Assessment:  Safety precautions to be maintained throughout the outpatient stay will include: orient to surroundings, keep bed in low position, maintain call bell within reach at all times, provide assistance with transfer out of bed and ambulation.  Medication Inspection Compliance: Tony Cardenas did not comply with our request to bring his pills to be counted. He was reminded that bringing the medication bottles, even when empty, is a requirement.  Medication: None brought in. Pill/Patch Count: None available to be counted. Bottle Appearance: No container available. Did not bring bottle(s) to appointment. Filled Date: N/A Last Medication intake:  Today

## 2019-04-30 NOTE — Pre-Procedure Instructions (Signed)
Pre-Admit Testing Provider Notification Note  Provider Notified: Dr. Izora Ribas  Notification Mode: Fax  Reason: Abnormal lab result.  Response: Fax confirmation received.   Additional Information: Placed on chart. Noted on Pre-Admit Worksheet.  Signed: Beulah Gandy, RN

## 2019-05-01 ENCOUNTER — Encounter: Payer: Self-pay | Admitting: Neurosurgery

## 2019-05-01 ENCOUNTER — Inpatient Hospital Stay: Payer: 59 | Admitting: Anesthesiology

## 2019-05-01 ENCOUNTER — Encounter
Admission: RE | Disposition: A | Discharge status: Home / Self Care | Payer: Self-pay | Source: Home / Self Care | Attending: Neurosurgery

## 2019-05-01 ENCOUNTER — Inpatient Hospital Stay: Payer: 59

## 2019-05-01 ENCOUNTER — Inpatient Hospital Stay
Admission: RE | Admit: 2019-05-01 | Discharge: 2019-05-02 | DRG: 473 | Disposition: A | Discharge status: Home / Self Care | Payer: 59 | Attending: Neurosurgery | Admitting: Neurosurgery

## 2019-05-01 ENCOUNTER — Other Ambulatory Visit: Payer: Self-pay

## 2019-05-01 DIAGNOSIS — Z79899 Other long term (current) drug therapy: Secondary | ICD-10-CM

## 2019-05-01 DIAGNOSIS — I10 Essential (primary) hypertension: Secondary | ICD-10-CM | POA: Diagnosis present

## 2019-05-01 DIAGNOSIS — Z20822 Contact with and (suspected) exposure to covid-19: Secondary | ICD-10-CM | POA: Diagnosis present

## 2019-05-01 DIAGNOSIS — Z7984 Long term (current) use of oral hypoglycemic drugs: Secondary | ICD-10-CM | POA: Diagnosis not present

## 2019-05-01 DIAGNOSIS — G894 Chronic pain syndrome: Secondary | ICD-10-CM | POA: Diagnosis present

## 2019-05-01 DIAGNOSIS — J449 Chronic obstructive pulmonary disease, unspecified: Secondary | ICD-10-CM | POA: Diagnosis present

## 2019-05-01 DIAGNOSIS — Z981 Arthrodesis status: Secondary | ICD-10-CM | POA: Diagnosis not present

## 2019-05-01 DIAGNOSIS — R1310 Dysphagia, unspecified: Secondary | ICD-10-CM | POA: Diagnosis present

## 2019-05-01 DIAGNOSIS — E1159 Type 2 diabetes mellitus with other circulatory complications: Secondary | ICD-10-CM | POA: Diagnosis present

## 2019-05-01 DIAGNOSIS — E785 Hyperlipidemia, unspecified: Secondary | ICD-10-CM | POA: Diagnosis not present

## 2019-05-01 DIAGNOSIS — M1990 Unspecified osteoarthritis, unspecified site: Secondary | ICD-10-CM | POA: Diagnosis present

## 2019-05-01 DIAGNOSIS — M960 Pseudarthrosis after fusion or arthrodesis: Principal | ICD-10-CM | POA: Diagnosis present

## 2019-05-01 DIAGNOSIS — E669 Obesity, unspecified: Secondary | ICD-10-CM | POA: Diagnosis not present

## 2019-05-01 DIAGNOSIS — Z87891 Personal history of nicotine dependence: Secondary | ICD-10-CM | POA: Diagnosis not present

## 2019-05-01 DIAGNOSIS — M4322 Fusion of spine, cervical region: Secondary | ICD-10-CM | POA: Diagnosis not present

## 2019-05-01 DIAGNOSIS — K219 Gastro-esophageal reflux disease without esophagitis: Secondary | ICD-10-CM | POA: Diagnosis present

## 2019-05-01 DIAGNOSIS — Z419 Encounter for procedure for purposes other than remedying health state, unspecified: Principal | ICD-10-CM

## 2019-05-01 DIAGNOSIS — Y838 Other surgical procedures as the cause of abnormal reaction of the patient, or of later complication, without mention of misadventure at the time of the procedure: Secondary | ICD-10-CM | POA: Diagnosis present

## 2019-05-01 HISTORY — PX: POSTERIOR CERVICAL FUSION/FORAMINOTOMY: SHX5038

## 2019-05-01 HISTORY — PX: ANTERIOR CERVICAL CORPECTOMY: SHX1159

## 2019-05-01 LAB — GLUCOSE, CAPILLARY
Glucose-Capillary: 202 mg/dL — ABNORMAL HIGH (ref 70–99)
Glucose-Capillary: 241 mg/dL — ABNORMAL HIGH (ref 70–99)
Glucose-Capillary: 410 mg/dL — ABNORMAL HIGH (ref 70–99)

## 2019-05-01 SURGERY — ANTERIOR CERVICAL CORPECTOMY
Anesthesia: General

## 2019-05-01 MED ORDER — OXYCODONE HCL 5 MG PO TABS
ORAL_TABLET | ORAL | Status: AC
  Administered 2019-05-02: 10 mg via ORAL
  Filled 2019-05-01: qty 2

## 2019-05-01 MED ORDER — PROPOFOL 500 MG/50ML IV EMUL
INTRAVENOUS | Status: AC
  Filled 2019-05-01: qty 50

## 2019-05-01 MED ORDER — FENTANYL CITRATE (PF) 100 MCG/2ML IJ SOLN
INTRAMUSCULAR | Status: DC | PRN
  Administered 2019-05-01 (×2): 50 ug via INTRAVENOUS

## 2019-05-01 MED ORDER — PROPOFOL 10 MG/ML IV BOLUS
INTRAVENOUS | Status: DC | PRN
  Administered 2019-05-01: 200 mg via INTRAVENOUS
  Administered 2019-05-01: 70 mg via INTRAVENOUS

## 2019-05-01 MED ORDER — ACETAMINOPHEN 10 MG/ML IV SOLN
INTRAVENOUS | Status: DC | PRN
  Administered 2019-05-01: 1000 mg via INTRAVENOUS

## 2019-05-01 MED ORDER — DEXMEDETOMIDINE HCL IN NACL 80 MCG/20ML IV SOLN
INTRAVENOUS | Status: AC
  Filled 2019-05-01: qty 20

## 2019-05-01 MED ORDER — REMIFENTANIL HCL 1 MG IV SOLR
INTRAVENOUS | Status: AC
  Filled 2019-05-01: qty 2000

## 2019-05-01 MED ORDER — SODIUM CHLORIDE 0.9% FLUSH: 3.0000 mL | INTRAVENOUS | Status: DC | PRN

## 2019-05-01 MED ORDER — CEFAZOLIN SODIUM 1 G IJ SOLR
INTRAMUSCULAR | Status: AC
  Filled 2019-05-01: qty 10

## 2019-05-01 MED ORDER — FAMOTIDINE 20 MG PO TABS
ORAL_TABLET | ORAL | Status: AC
  Administered 2019-05-01: 20 mg via ORAL
  Filled 2019-05-01: qty 1

## 2019-05-01 MED ORDER — ALBUTEROL SULFATE (2.5 MG/3ML) 0.083% IN NEBU: 2.5000 mg | INHALATION_SOLUTION | Freq: Four times a day (QID) | RESPIRATORY_TRACT | Status: DC | PRN

## 2019-05-01 MED ORDER — SODIUM CHLORIDE 0.9 % IV SOLN
INTRAVENOUS | Status: DC | PRN
  Administered 2019-05-01: 13:00:00 50 ug/min via INTRAVENOUS

## 2019-05-01 MED ORDER — DEXAMETHASONE SODIUM PHOSPHATE 10 MG/ML IJ SOLN: 8.0000 mg | Freq: Four times a day (QID) | INTRAMUSCULAR | Status: DC

## 2019-05-01 MED ORDER — PHENYLEPHRINE HCL (PRESSORS) 10 MG/ML IV SOLN
INTRAVENOUS | Status: AC
  Filled 2019-05-01: qty 1

## 2019-05-01 MED ORDER — PREGABALIN 75 MG PO CAPS
ORAL_CAPSULE | ORAL | Status: AC
  Administered 2019-05-01: 75 mg via ORAL
  Filled 2019-05-01: qty 1

## 2019-05-01 MED ORDER — CEFAZOLIN SODIUM-DEXTROSE 2-4 GM/100ML-% IV SOLN
INTRAVENOUS | Status: AC
  Filled 2019-05-01: qty 100

## 2019-05-01 MED ORDER — OXYCODONE HCL 5 MG PO TABS
5.0000 mg | ORAL_TABLET | ORAL | Status: DC | PRN
  Administered 2019-05-02: 5 mg via ORAL

## 2019-05-01 MED ORDER — SODIUM CHLORIDE FLUSH 0.9 % IV SOLN
INTRAVENOUS | Status: AC
  Administered 2019-05-01: 3 mL via INTRAVENOUS
  Filled 2019-05-01: qty 10

## 2019-05-01 MED ORDER — HYDROMORPHONE HCL 1 MG/ML IJ SOLN
0.5000 mg | INTRAMUSCULAR | Status: DC | PRN
  Administered 2019-05-02 (×3): 0.5 mg via INTRAVENOUS

## 2019-05-01 MED ORDER — LISINOPRIL 5 MG PO TABS
5.0000 mg | ORAL_TABLET | Freq: Every day | ORAL | Status: DC
  Administered 2019-05-02: 5 mg via ORAL
  Filled 2019-05-01: qty 1

## 2019-05-01 MED ORDER — LIDOCAINE HCL (CARDIAC) PF 100 MG/5ML IV SOSY
PREFILLED_SYRINGE | INTRAVENOUS | Status: DC | PRN
  Administered 2019-05-01 (×2): 100 mg via INTRAVENOUS

## 2019-05-01 MED ORDER — MEPERIDINE HCL 50 MG/ML IJ SOLN: 6.2500 mg | INTRAMUSCULAR | Status: DC | PRN

## 2019-05-01 MED ORDER — SODIUM CHLORIDE 0.9 % IV SOLN
INTRAVENOUS | Status: DC | PRN
  Administered 2019-05-01: 40 mL

## 2019-05-01 MED ORDER — FENTANYL CITRATE (PF) 100 MCG/2ML IJ SOLN
25.0000 ug | INTRAMUSCULAR | Status: AC | PRN
  Administered 2019-05-01 (×6): 25 ug via INTRAVENOUS

## 2019-05-01 MED ORDER — VANCOMYCIN HCL 1000 MG IV SOLR
INTRAVENOUS | Status: DC | PRN
  Administered 2019-05-01: 1000 mg via TOPICAL

## 2019-05-01 MED ORDER — DEXMEDETOMIDINE HCL 200 MCG/2ML IV SOLN
INTRAVENOUS | Status: DC | PRN
  Administered 2019-05-01: 20 ug via INTRAVENOUS
  Administered 2019-05-01: 8 ug via INTRAVENOUS

## 2019-05-01 MED ORDER — PHENOL 1.4 % MT LIQD
1.0000 | OROMUCOSAL | Status: DC | PRN
  Filled 2019-05-01: qty 177

## 2019-05-01 MED ORDER — PROPOFOL 10 MG/ML IV BOLUS
INTRAVENOUS | Status: AC
  Filled 2019-05-01: qty 20

## 2019-05-01 MED ORDER — HYDROMORPHONE HCL 1 MG/ML IJ SOLN
INTRAMUSCULAR | Status: AC
  Administered 2019-05-01: 0.5 mg via INTRAVENOUS
  Filled 2019-05-01: qty 0.5

## 2019-05-01 MED ORDER — SODIUM CHLORIDE 0.9 % IV SOLN: INTRAVENOUS | Status: DC

## 2019-05-01 MED ORDER — ACETAMINOPHEN 500 MG PO TABS
ORAL_TABLET | ORAL | Status: AC
  Administered 2019-05-01: 1000 mg via ORAL
  Filled 2019-05-01: qty 2

## 2019-05-01 MED ORDER — METHOCARBAMOL 1000 MG/10ML IJ SOLN
500.0000 mg | Freq: Four times a day (QID) | INTRAVENOUS | Status: DC
  Administered 2019-05-01: 500 mg via INTRAVENOUS
  Filled 2019-05-01 (×9): qty 5

## 2019-05-01 MED ORDER — OXYCODONE HCL 5 MG PO TABS: 5.0000 mg | ORAL_TABLET | Freq: Once | ORAL | Status: DC | PRN

## 2019-05-01 MED ORDER — SODIUM CHLORIDE 0.9 % IV SOLN: INTRAVENOUS | Status: DC | PRN

## 2019-05-01 MED ORDER — FENTANYL CITRATE (PF) 100 MCG/2ML IJ SOLN
INTRAMUSCULAR | Status: AC
  Filled 2019-05-01: qty 2

## 2019-05-01 MED ORDER — ONDANSETRON HCL 4 MG/2ML IJ SOLN
INTRAMUSCULAR | Status: DC | PRN
  Administered 2019-05-01: 4 mg via INTRAVENOUS

## 2019-05-01 MED ORDER — MIDAZOLAM HCL 2 MG/2ML IJ SOLN
INTRAMUSCULAR | Status: AC
  Filled 2019-05-01: qty 2

## 2019-05-01 MED ORDER — SUCCINYLCHOLINE CHLORIDE 20 MG/ML IJ SOLN
INTRAMUSCULAR | Status: DC | PRN
  Administered 2019-05-01: 140 mg via INTRAVENOUS

## 2019-05-01 MED ORDER — MENTHOL 3 MG MT LOZG
1.0000 | LOZENGE | OROMUCOSAL | Status: DC | PRN
  Filled 2019-05-01: qty 9

## 2019-05-01 MED ORDER — OXYCODONE HCL 5 MG PO TABS
10.0000 mg | ORAL_TABLET | ORAL | Status: DC | PRN
  Administered 2019-05-01: 10 mg via ORAL

## 2019-05-01 MED ORDER — HYDROXYZINE HCL 25 MG PO TABS
25.0000 mg | ORAL_TABLET | Freq: Three times a day (TID) | ORAL | Status: DC | PRN
  Filled 2019-05-01: qty 1

## 2019-05-01 MED ORDER — FAMOTIDINE 20 MG PO TABS: 20.0000 mg | ORAL_TABLET | Freq: Once | ORAL | Status: AC

## 2019-05-01 MED ORDER — REMIFENTANIL HCL 2 MG IV SOLR
INTRAVENOUS | Status: DC | PRN
  Administered 2019-05-01: .1 ug/kg/min via INTRAVENOUS

## 2019-05-01 MED ORDER — DEXAMETHASONE SODIUM PHOSPHATE 10 MG/ML IJ SOLN
INTRAMUSCULAR | Status: AC
  Administered 2019-05-01: 8 mg via INTRAVENOUS
  Filled 2019-05-01: qty 1

## 2019-05-01 MED ORDER — POLYETHYLENE GLYCOL 3350 17 G PO PACK
17.0000 g | PACK | Freq: Every day | ORAL | Status: DC | PRN
  Filled 2019-05-01: qty 1

## 2019-05-01 MED ORDER — PROPOFOL 500 MG/50ML IV EMUL
INTRAVENOUS | Status: DC | PRN
  Administered 2019-05-01: 150 ug/kg/min via INTRAVENOUS

## 2019-05-01 MED ORDER — SODIUM CHLORIDE 0.9 % IR SOLN
Status: DC | PRN
  Administered 2019-05-01: 1000 mL

## 2019-05-01 MED ORDER — METFORMIN HCL ER 500 MG PO TB24
1000.0000 mg | ORAL_TABLET | Freq: Two times a day (BID) | ORAL | Status: DC
  Administered 2019-05-01 – 2019-05-02 (×3): 1000 mg via ORAL
  Filled 2019-05-01 (×4): qty 2

## 2019-05-01 MED ORDER — ONDANSETRON HCL 4 MG PO TABS: 4.0000 mg | ORAL_TABLET | Freq: Four times a day (QID) | ORAL | Status: DC | PRN

## 2019-05-01 MED ORDER — PREGABALIN 25 MG PO CAPS
75.0000 mg | ORAL_CAPSULE | Freq: Every day | ORAL | Status: DC
  Filled 2019-05-01: qty 3

## 2019-05-01 MED ORDER — METHOCARBAMOL 500 MG PO TABS
1000.0000 mg | ORAL_TABLET | Freq: Four times a day (QID) | ORAL | Status: DC
  Administered 2019-05-02: 1000 mg via ORAL

## 2019-05-01 MED ORDER — INSULIN ASPART 100 UNIT/ML ~~LOC~~ SOLN
SUBCUTANEOUS | Status: AC
  Filled 2019-05-01: qty 1

## 2019-05-01 MED ORDER — ACETAMINOPHEN 325 MG PO TABS: 650.0000 mg | ORAL_TABLET | ORAL | Status: DC | PRN

## 2019-05-01 MED ORDER — SODIUM CHLORIDE 0.9% FLUSH
3.0000 mL | Freq: Two times a day (BID) | INTRAVENOUS | Status: DC
  Administered 2019-05-02: 3 mL via INTRAVENOUS

## 2019-05-01 MED ORDER — FLUTICASONE PROPIONATE 50 MCG/ACT NA SUSP
1.0000 | Freq: Every day | NASAL | Status: DC
  Administered 2019-05-02: 1 via NASAL
  Filled 2019-05-01: qty 16

## 2019-05-01 MED ORDER — DEXAMETHASONE SODIUM PHOSPHATE 10 MG/ML IJ SOLN
INTRAMUSCULAR | Status: DC | PRN
  Administered 2019-05-01: 5 mg via INTRAVENOUS

## 2019-05-01 MED ORDER — BISACODYL 5 MG PO TBEC
5.0000 mg | DELAYED_RELEASE_TABLET | Freq: Every day | ORAL | Status: DC | PRN
  Filled 2019-05-01: qty 1

## 2019-05-01 MED ORDER — INSULIN ASPART 100 UNIT/ML ~~LOC~~ SOLN
0.0000 [IU] | Freq: Three times a day (TID) | SUBCUTANEOUS | Status: DC
  Administered 2019-05-02: 11 [IU] via SUBCUTANEOUS
  Administered 2019-05-02 (×2): 15 [IU] via SUBCUTANEOUS

## 2019-05-01 MED ORDER — ACETAMINOPHEN 500 MG PO TABS: 1000.0000 mg | ORAL_TABLET | Freq: Four times a day (QID) | ORAL | Status: AC

## 2019-05-01 MED ORDER — BUPIVACAINE HCL (PF) 0.5 % IJ SOLN
INTRAMUSCULAR | Status: DC | PRN
  Administered 2019-05-01: 20 mL

## 2019-05-01 MED ORDER — MAGNESIUM CITRATE PO SOLN
1.0000 | Freq: Once | ORAL | Status: DC | PRN
  Filled 2019-05-01: qty 296

## 2019-05-01 MED ORDER — SENNA 8.6 MG PO TABS
1.0000 | ORAL_TABLET | Freq: Two times a day (BID) | ORAL | Status: DC
  Administered 2019-05-01 – 2019-05-02 (×2): 8.6 mg via ORAL
  Filled 2019-05-01 (×3): qty 1

## 2019-05-01 MED ORDER — PROMETHAZINE HCL 25 MG/ML IJ SOLN: 6.2500 mg | INTRAMUSCULAR | Status: DC | PRN

## 2019-05-01 MED ORDER — ONDANSETRON HCL 4 MG/2ML IJ SOLN: 4.0000 mg | Freq: Four times a day (QID) | INTRAMUSCULAR | Status: DC | PRN

## 2019-05-01 MED ORDER — BUPIVACAINE-EPINEPHRINE (PF) 0.5% -1:200000 IJ SOLN
INTRAMUSCULAR | Status: DC | PRN
  Administered 2019-05-01: 12 mL via PERINEURAL

## 2019-05-01 MED ORDER — MIDAZOLAM HCL 2 MG/2ML IJ SOLN
INTRAMUSCULAR | Status: DC | PRN
  Administered 2019-05-01 (×2): 2 mg via INTRAVENOUS

## 2019-05-01 MED ORDER — STERILE WATER FOR INJECTION IJ SOLN
INTRAMUSCULAR | Status: AC
  Filled 2019-05-01: qty 10

## 2019-05-01 MED ORDER — CEFAZOLIN SODIUM-DEXTROSE 2-4 GM/100ML-% IV SOLN
2.0000 g | Freq: Once | INTRAVENOUS | Status: AC
  Administered 2019-05-01: 1 g via INTRAVENOUS
  Administered 2019-05-01: 2 g via INTRAVENOUS

## 2019-05-01 MED ORDER — ACETAMINOPHEN 10 MG/ML IV SOLN
INTRAVENOUS | Status: AC
  Filled 2019-05-01: qty 100

## 2019-05-01 MED ORDER — INSULIN ASPART 100 UNIT/ML ~~LOC~~ SOLN
5.0000 [IU] | Freq: Once | SUBCUTANEOUS | Status: AC
  Administered 2019-05-01: 5 [IU] via SUBCUTANEOUS

## 2019-05-01 MED ORDER — OXYCODONE HCL 5 MG/5ML PO SOLN: 5.0000 mg | Freq: Once | ORAL | Status: DC | PRN

## 2019-05-01 MED ORDER — ACETAMINOPHEN 650 MG RE SUPP: 650.0000 mg | RECTAL | Status: DC | PRN

## 2019-05-01 MED ORDER — SODIUM CHLORIDE 0.9 % IV SOLN
250.0000 mL | INTRAVENOUS | Status: DC
  Administered 2019-05-01: 250 mL via INTRAVENOUS

## 2019-05-01 MED ORDER — KETAMINE HCL 10 MG/ML IJ SOLN
INTRAMUSCULAR | Status: DC | PRN
  Administered 2019-05-01: 20 mg via INTRAVENOUS
  Administered 2019-05-01: 10 mg via INTRAVENOUS
  Administered 2019-05-01: 50 mg via INTRAVENOUS
  Administered 2019-05-01: 20 mg via INTRAVENOUS

## 2019-05-01 MED ORDER — EZETIMIBE 10 MG PO TABS
10.0000 mg | ORAL_TABLET | Freq: Every day | ORAL | Status: DC
  Administered 2019-05-02: 10 mg via ORAL
  Filled 2019-05-01: qty 1

## 2019-05-01 MED ORDER — ONDANSETRON HCL 4 MG/2ML IJ SOLN
INTRAMUSCULAR | Status: AC
  Filled 2019-05-01: qty 2

## 2019-05-01 MED ORDER — DEXAMETHASONE SODIUM PHOSPHATE 10 MG/ML IJ SOLN
INTRAMUSCULAR | Status: AC
  Filled 2019-05-01: qty 1

## 2019-05-01 MED ORDER — REMIFENTANIL HCL 1 MG IV SOLR
INTRAVENOUS | Status: AC
  Filled 2019-05-01: qty 1000

## 2019-05-01 MED ORDER — SODIUM CHLORIDE 0.9 % IV SOLN
INTRAVENOUS | Status: DC
  Administered 2019-05-01: 75 mL/h via INTRAVENOUS

## 2019-05-01 MED ORDER — REMIFENTANIL HCL 1 MG IV SOLR
INTRAVENOUS | Status: DC | PRN
  Administered 2019-05-01: 100 ug via INTRAVENOUS

## 2019-05-01 MED ORDER — PHENYLEPHRINE HCL (PRESSORS) 10 MG/ML IV SOLN
INTRAVENOUS | Status: DC | PRN
  Administered 2019-05-01 (×5): 100 ug via INTRAVENOUS
  Administered 2019-05-01: 200 ug via INTRAVENOUS
  Administered 2019-05-01: 100 ug via INTRAVENOUS
  Administered 2019-05-01: 200 ug via INTRAVENOUS

## 2019-05-01 MED ORDER — THROMBIN 5000 UNITS EX SOLR
CUTANEOUS | Status: DC | PRN
  Administered 2019-05-01: 5000 [IU] via TOPICAL

## 2019-05-01 SURGICAL SUPPLY — 103 items
ADH SKN CLS APL DERMABOND .7 (GAUZE/BANDAGES/DRESSINGS) ×1
AGENT HMST MTR 8 SURGIFLO (HEMOSTASIS) ×1
APL PRP STRL LF DISP 70% ISPRP (MISCELLANEOUS) ×2
BASKET BONE COLLECTION (BASKET) IMPLANT
BIT DRILL 3.5 SHORT (BIT) ×1 IMPLANT
BIT DRILL 3.5MM SHORT (BIT) ×1
BLADE BOVIE TIP EXT 4 (BLADE) ×3 IMPLANT
BLADE SURG 15 STRL LF DISP TIS (BLADE) ×1 IMPLANT
BLADE SURG 15 STRL SS (BLADE) ×3
BULB RESERV EVAC DRAIN JP 100C (MISCELLANEOUS) ×3 IMPLANT
BUR NEURO DRILL SOFT 3.0X3.8M (BURR) ×3 IMPLANT
CANISTER SUCT 1200ML W/VALVE (MISCELLANEOUS) ×6 IMPLANT
CAP LOCKING (Cap) IMPLANT
CHLORAPREP W/TINT 26 (MISCELLANEOUS) ×6 IMPLANT
CNTNR SPEC 2.5X3XGRAD LEK (MISCELLANEOUS) ×1
CONT SPEC 4OZ STER OR WHT (MISCELLANEOUS) ×2
CONT SPEC 4OZ STRL OR WHT (MISCELLANEOUS) ×1
CONTAINER SPEC 2.5X3XGRAD LEK (MISCELLANEOUS) IMPLANT
COUNTER NEEDLE 20/40 LG (NEEDLE) ×5 IMPLANT
COVER BACK TABLE REUSABLE LG (DRAPES) ×3 IMPLANT
COVER LIGHT HANDLE STERIS (MISCELLANEOUS) ×6 IMPLANT
COVER WAND RF STERILE (DRAPES) ×3 IMPLANT
CRADLE LAMINECT ARM (MISCELLANEOUS) ×3 IMPLANT
CUP MEDICINE 2OZ PLAST GRAD ST (MISCELLANEOUS) ×6 IMPLANT
DERMABOND ADVANCED (GAUZE/BANDAGES/DRESSINGS) ×2
DERMABOND ADVANCED .7 DNX12 (GAUZE/BANDAGES/DRESSINGS) ×1 IMPLANT
DRAIN CHANNEL JP 10F RND 20C F (MISCELLANEOUS) ×3 IMPLANT
DRAPE 3/4 80X56 (DRAPES) ×3 IMPLANT
DRAPE C-ARM 42X72 X-RAY (DRAPES) ×10 IMPLANT
DRAPE INCISE IOBAN 66X45 STRL (DRAPES) IMPLANT
DRAPE LAPAROTOMY 100X77 ABD (DRAPES) ×3 IMPLANT
DRAPE LAPAROTOMY 77X122 PED (DRAPES) ×3 IMPLANT
DRAPE MICROSCOPE SPINE 48X150 (DRAPES) ×3 IMPLANT
DRAPE POUCH INSTRU U-SHP 10X18 (DRAPES) ×1 IMPLANT
DRAPE SURG 17X11 SM STRL (DRAPES) ×20 IMPLANT
DRSG TEGADERM 2-3/8X2-3/4 SM (GAUZE/BANDAGES/DRESSINGS) ×2 IMPLANT
DRSG TEGADERM 6X8 (GAUZE/BANDAGES/DRESSINGS) ×2 IMPLANT
DRSG TELFA 3X8 NADH (GAUZE/BANDAGES/DRESSINGS) ×3 IMPLANT
DRSG TELFA 4X3 1S NADH ST (GAUZE/BANDAGES/DRESSINGS) ×2 IMPLANT
ELECT CAUTERY BLADE TIP 2.5 (TIP) ×3
ELECT REM PT RETURN 9FT ADLT (ELECTROSURGICAL) ×6
ELECTRODE CAUTERY BLDE TIP 2.5 (TIP) ×1 IMPLANT
ELECTRODE REM PT RTRN 9FT ADLT (ELECTROSURGICAL) ×1 IMPLANT
FEE INTRAOP MONITOR IMPULS NCS (MISCELLANEOUS) IMPLANT
FRAME EYE SHIELD (PROTECTIVE WEAR) ×6 IMPLANT
GAUZE SPONGE 4X4 12PLY STRL (GAUZE/BANDAGES/DRESSINGS) ×3 IMPLANT
GLOVE BIOGEL PI IND STRL 7.0 (GLOVE) ×1 IMPLANT
GLOVE BIOGEL PI INDICATOR 7.0 (GLOVE) ×2
GLOVE SURG SYN 7.0 (GLOVE) ×6 IMPLANT
GLOVE SURG SYN 7.0 PF PI (GLOVE) ×2 IMPLANT
GLOVE SURG SYN 8.5  E (GLOVE) ×9
GLOVE SURG SYN 8.5 E (GLOVE) ×3 IMPLANT
GLOVE SURG SYN 8.5 PF PI (GLOVE) ×3 IMPLANT
GOWN SRG XL LVL 3 NONREINFORCE (GOWNS) ×1 IMPLANT
GOWN STRL NON-REIN TWL XL LVL3 (GOWNS) ×3
GOWN STRL REUS W/ TWL LRG LVL3 (GOWN DISPOSABLE) ×1 IMPLANT
GOWN STRL REUS W/TWL LRG LVL3 (GOWN DISPOSABLE) ×3
GOWN STRL REUS W/TWL MED LVL3 (GOWN DISPOSABLE) ×3 IMPLANT
GRADUATE 1200CC STRL 31836 (MISCELLANEOUS) ×3 IMPLANT
HEMOVAC 400CC 10FR (MISCELLANEOUS) ×2 IMPLANT
IMPL QUARTEX 3.5X14MM (Neuro Prosthesis/Implant) IMPLANT
IMPLANT QUARTEX 3.5X14MM (Neuro Prosthesis/Implant) ×24 IMPLANT
INTRAOP MONITOR FEE IMPULS NCS (MISCELLANEOUS)
INTRAOP MONITOR FEE IMPULSE (MISCELLANEOUS)
KIT INFUSE MEDIUM (Orthopedic Implant) ×2 IMPLANT
KIT TURNOVER KIT A (KITS) ×3 IMPLANT
LOCKING CAP (Cap) ×24 IMPLANT
MARKER SKIN DUAL TIP RULER LAB (MISCELLANEOUS) ×8 IMPLANT
NDL SAFETY ECLIPSE 18X1.5 (NEEDLE) ×1 IMPLANT
NDL SPNL 18GX3.5 QUINCKE PK (NEEDLE) ×3 IMPLANT
NEEDLE HYPO 18GX1.5 SHARP (NEEDLE) ×3
NEEDLE HYPO 22GX1.5 SAFETY (NEEDLE) ×5 IMPLANT
NEEDLE SPNL 18GX3.5 QUINCKE PK (NEEDLE) ×9 IMPLANT
NS IRRIG 1000ML POUR BTL (IV SOLUTION) ×3 IMPLANT
PACK LAMINECTOMY NEURO (CUSTOM PROCEDURE TRAY) ×3 IMPLANT
PAD ARMBOARD 7.5X6 YLW CONV (MISCELLANEOUS) ×3 IMPLANT
PAD DRESSING TELFA 3X8 NADH (GAUZE/BANDAGES/DRESSINGS) IMPLANT
PIN CASPAR 14 (PIN) ×1 IMPLANT
PIN CASPAR 14MM (PIN) ×3
PIN MAYFIELD SKULL DISP (PIN) ×3 IMPLANT
PUTTY DBX 10CC (Bone Implant) ×4 IMPLANT
PUTTY DBX 1CC (Putty) ×3 IMPLANT
PUTTY DBX 1CC DEPUY (Putty) IMPLANT
ROD SPINE CVD 3.5X60MM (Rod) ×4 IMPLANT
SPOGE SURGIFLO 8M (HEMOSTASIS) ×3
SPONGE KITTNER 5P (MISCELLANEOUS) ×3 IMPLANT
SPONGE SURGIFLO 8M (HEMOSTASIS) ×1 IMPLANT
STAPLER SKIN PROX 35W (STAPLE) ×8 IMPLANT
SUT ETHILON 3-0 FS-10 30 BLK (SUTURE)
SUT V-LOC 90 ABS DVC 3-0 CL (SUTURE) ×3 IMPLANT
SUT VIC AB 0 CT1 18XCR BRD 8 (SUTURE) IMPLANT
SUT VIC AB 0 CT1 27 (SUTURE) ×9
SUT VIC AB 0 CT1 27XCR 8 STRN (SUTURE) ×3 IMPLANT
SUT VIC AB 0 CT1 8-18 (SUTURE) ×3
SUT VIC AB 2-0 CT1 18 (SUTURE) ×6 IMPLANT
SUT VIC AB 3-0 SH 8-18 (SUTURE) ×3 IMPLANT
SUTURE EHLN 3-0 FS-10 30 BLK (SUTURE) IMPLANT
SYR 30ML LL (SYRINGE) ×9 IMPLANT
TAPE CLOTH 3X10 WHT NS LF (GAUZE/BANDAGES/DRESSINGS) ×8 IMPLANT
TOWEL OR 17X26 4PK STRL BLUE (TOWEL DISPOSABLE) ×12 IMPLANT
TRAY FOLEY MTR SLVR 16FR STAT (SET/KITS/TRAYS/PACK) ×2 IMPLANT
TUBING CONNECTING 10 (TUBING) ×2 IMPLANT
TUBING CONNECTING 10' (TUBING) ×1

## 2019-05-01 NOTE — Progress Notes (Signed)
Procedure: Anterior cervical plate removal, 624THL posterior fusion Procedure date: 05/01/2019 Diagnosis: Pseudoarthrosis   History: Tony Cardenas is s/p anterior cervical plate removal and 624THL posterior cervical fusion  POD: Tolerated procedure well. Evaluated in post op recovery still disoriented from anesthesia but able to answer questions and obey commands.   Physical Exam: Vitals:   05/01/19 1119 05/01/19 1706  BP: 132/71   Pulse: 91   Resp: 17   Temp: 97.8 F (36.6 C) (!) (P) 97.2 F (36.2 C)  SpO2: 97%    Strength:5/5 throughout upper and lower extremities Sensation: intact and symmetric throughout upper and lower extremities Skin: dressing intact posterior incision site. Glue intact anterior incision site.   Data:  Recent Labs  Lab 04/29/19 0803  NA 134*  K 3.5  CL 102  CO2 20*  BUN 10  CREATININE 0.63  GLUCOSE 249*  CALCIUM 8.5*   No results for input(s): AST, ALT, ALKPHOS in the last 168 hours.  Invalid input(s): TBILI   Recent Labs  Lab 04/29/19 0803  WBC 9.5  HGB 14.6  HCT 40.6  PLT 401*   Recent Labs  Lab 04/29/19 0803  APTT 40*  INR 1.0         Other tests/results: Cervical x-rays pending  Assessment/Plan:  Tony Cardenas is POD0 s/p anterior cervical plate removal with 624THL posterior cervical fusion. Will continue to monitor  - monitor drain output -2 drains -JP anterior, Hemovac posterior - mobilize - pain control - DVT prophylaxis - PTOT - Brace -patient supplied - Imaging -pending   Marin Olp PA-C Department of Neurosurgery

## 2019-05-01 NOTE — Anesthesia Procedure Notes (Signed)
Procedure Name: Intubation Date/Time: 05/01/2019 12:47 PM Performed by: Jonna Clark, CRNA Pre-anesthesia Checklist: Patient identified, Patient being monitored, Timeout performed, Emergency Drugs available and Suction available Patient Re-evaluated:Patient Re-evaluated prior to induction Oxygen Delivery Method: Circle system utilized Preoxygenation: Pre-oxygenation with 100% oxygen Induction Type: IV induction Ventilation: Mask ventilation without difficulty Laryngoscope Size: McGraph and 4 Grade View: Grade II Tube type: Oral Tube size: 7.5 mm Number of attempts: 1 Airway Equipment and Method: Stylet Placement Confirmation: ETT inserted through vocal cords under direct vision,  positive ETCO2 and breath sounds checked- equal and bilateral Secured at: 23 cm Tube secured with: Tape Dental Injury: Teeth and Oropharynx as per pre-operative assessment  Difficulty Due To: Difficult Airway- due to large tongue and Difficult Airway- due to reduced neck mobility Future Recommendations: Recommend- induction with short-acting agent, and alternative techniques readily available

## 2019-05-01 NOTE — Op Note (Signed)
Indications: 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  After showing worsening findings, he opted for surgical intervention.  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   Preoperative Note:   Risks of surgery discussed include: infection, bleeding, stroke, coma, death, paralysis, CSF leak, nerve/spinal cord injury, numbness, tingling, weakness, complex regional pain syndrome, recurrent stenosis and/or disc herniation, vascular injury, development of instability, neck/back pain, need for further surgery, persistent symptoms, development of deformity, and the risks of anesthesia. The patient understood these risks and agreed to proceed.  Operative Note:  PROCEDURES: 1. C3-5 anterior arthrodesis with prior corpectomy and removal of plate 2. Posterior C3-6 instrumentation and fusion using Globus Quartex  PROCEDURE IN DETAIL: After obtaining informed consent, the patient taken to the operating room, placed in supine position, general anesthesia induced.  The patient had a small shoulder roll placed behind their shoulders.  After a timeout, the patient received preop antibiotics and 10 mg of IV Decadron.  The patient had the prior neck incision outlined, was prepped and draped in usual sterile fashion. The incision was injected with local anesthetic.    An incision was opened, dissection taken down medial to the carotid artery and jugular vein, lateral to the trachea and esophagus.  The prevertebral fascia identified and the prior plate palpated.  The dissection was very difficult due to extensive scar tissue.  After safely  dissecting to the plate, the plate was fully exposed using blunt dissection.  The plate was removed, and the corpectomy device identified.   After inspection of the corpectomy device, it was felt that it was safer for the patient to leave the corpectomy device in place and place addition allograft for anterior arthrodesis.  The exposed bone edges were gently decorticated, then allograft was placed to aid in arthrodesis.    With everything in good position, the wound was irrigated copiously with bacitracin-containing solution and meticulous hemostasis obtained.  Wound was closed in 2 layers using interrupted inverted 3-0 Vicryl sutures.  A drain was placed. The wound was dressed with dermabond.   We then moved to the posterior portion of the procedure.   The Mayfield headholder was placed. The patient was then positioned in the prone position, and secured to the bed. A midline incision was then planned using fluoroscopy.  A second timeout was performed.  Next, the posterior cervical region was prepped and draped in the usual sterile fashion. The incision was injected with local anesthetic, the opened sharply. A subperiosteal dissection was then carried out to expose the remaining posterior elements from C2 and C6, with careful attention paid to maintaining the C2/3 facet capsule.  After satisfactory exposure had been obtained, our attention was turned to placement of lateral mass screws.  On each side, the high speed drill was used to remove the soft tissue of the facet from C3/4 to C5/6. Lateral mass screws were then placed at each level using a modified Magerl technique. Briefly, a pilot hole was drilled in the lateral mass using the high-speed drill on each side.  Next, a drill was used to drill a tract in each lateral mass to 89mm. A balltip probe was used to confirm lack of breach.  We then placed 3.5x 14 mm screws at C3, 3.5x 14 mm screws at C4, 3.5x 14 mm screws at C5, and 3.5x 14 mm screws at C6.   Rods were measured and shaped, then secured to the screws according to manufacturer's specifications.  Final AP and lateral radiographs were taken.  The wound was copiously irrigated with bacitracin-containing solution and hemostasis was achieved.  Using high-speed drill, the lateral margin of the lateral masses and laminae were gently decorticated.  BMP and allograft were placed for posterolateral fusion from C3 to C6.  A Hemovac drain was then placed in the wound deep to the fascia.   The wound was closed in a multilayer fashion using interrupted 0 and 2-0 Vicryl sutures.   Staples were placed on the skin.  After closure, the patient was flipped supine and the Mayfield tongs removed.  Patient was then handed back over to anesthesia. All counts were correct at the conclusion of the procedure.  Neurological monitoring was used throughout, and there were no changes.  Tony Olp PA acted as an Pensions consultant throughout the case.   Tony Maw MD
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
## 2019-05-01 NOTE — H&P (Signed)
I have reviewed and confirmed my history and physical from 04/18/2019 with no additions or changes. Plan for Revision C4 corpectomy, C3-6 posterior fusion.  Risks and benefits reviewed.  Heart sounds normal no MRG. Chest Clear to Auscultation Bilaterally.

## 2019-05-01 NOTE — Transfer of Care (Signed)
Immediate Anesthesia Transfer of Care Note  Patient: Tony Cardenas  Procedure(s) Performed: ANTERIOR CERVICAL CORPECTOMY C4 (N/A ) C3-6 POSTERIOR FUSION (N/A )  Patient Location: PACU  Anesthesia Type:General  Level of Consciousness: sedated and responds to stimulation  Airway & Oxygen Therapy: Patient Spontanous Breathing and Patient connected to face mask oxygen  Post-op Assessment: Report given to RN and Post -op Vital signs reviewed and stable  Post vital signs: Reviewed and stable  Last Vitals:  Vitals Value Taken Time  BP 121/68 05/01/19 1706  Temp 36.2 C 05/01/19 1706  Pulse 89 05/01/19 1713  Resp 13 05/01/19 1713  SpO2 95 % 05/01/19 1713  Vitals shown include unvalidated device data.  Last Pain:  Vitals:   05/01/19 1706  TempSrc:   PainSc: 0-No pain         Complications: No apparent anesthesia complications

## 2019-05-02 ENCOUNTER — Inpatient Hospital Stay: Payer: 59

## 2019-05-02 LAB — GLUCOSE, CAPILLARY
Glucose-Capillary: 317 mg/dL — ABNORMAL HIGH (ref 70–99)
Glucose-Capillary: 377 mg/dL — ABNORMAL HIGH (ref 70–99)
Glucose-Capillary: 375 mg/dL — ABNORMAL HIGH (ref 70–99)
Glucose-Capillary: 395 mg/dL — ABNORMAL HIGH (ref 70–99)

## 2019-05-02 LAB — HEMOGLOBIN A1C
Hgb A1c MFr Bld: 8.3 % — ABNORMAL HIGH (ref 4.8–5.6)
Mean Plasma Glucose: 191.51 mg/dL

## 2019-05-02 MED ORDER — HYDROMORPHONE HCL 1 MG/ML IJ SOLN
INTRAMUSCULAR | Status: AC
  Filled 2019-05-02: qty 1

## 2019-05-02 MED ORDER — OXYCODONE HCL 5 MG PO TABS: 5.0000 mg | ORAL_TABLET | ORAL | 0 refills | Status: AC | PRN

## 2019-05-02 MED ORDER — INSULIN ASPART 100 UNIT/ML ~~LOC~~ SOLN
SUBCUTANEOUS | Status: AC
  Administered 2019-05-02: 15 [IU] via SUBCUTANEOUS
  Filled 2019-05-02: qty 1

## 2019-05-02 MED ORDER — METHYLPREDNISOLONE 4 MG PO TBPK: ORAL_TABLET | ORAL | 0 refills | Status: DC

## 2019-05-02 MED ORDER — METHOCARBAMOL 500 MG PO TABS
ORAL_TABLET | ORAL | Status: AC
  Administered 2019-05-02: 1000 mg via ORAL
  Filled 2019-05-02: qty 2

## 2019-05-02 MED ORDER — ACETAMINOPHEN 500 MG PO TABS
ORAL_TABLET | ORAL | Status: AC
  Administered 2019-05-02: 1000 mg via ORAL
  Filled 2019-05-02: qty 2

## 2019-05-02 MED ORDER — INSULIN ASPART 100 UNIT/ML ~~LOC~~ SOLN
SUBCUTANEOUS | Status: AC
  Filled 2019-05-02: qty 1

## 2019-05-02 MED ORDER — HYDROMORPHONE HCL 1 MG/ML IJ SOLN
INTRAMUSCULAR | Status: AC
  Administered 2019-05-02: 0.5 mg via INTRAVENOUS
  Filled 2019-05-02: qty 1

## 2019-05-02 MED ORDER — OXYCODONE HCL 5 MG PO TABS
ORAL_TABLET | ORAL | Status: AC
  Filled 2019-05-02: qty 1

## 2019-05-02 MED ORDER — OXYCODONE HCL 5 MG PO TABS
ORAL_TABLET | ORAL | Status: AC
  Administered 2019-05-02: 10 mg via ORAL
  Filled 2019-05-02: qty 2

## 2019-05-02 MED ORDER — ACETAMINOPHEN 500 MG PO TABS
ORAL_TABLET | ORAL | Status: AC
  Filled 2019-05-02: qty 2

## 2019-05-02 MED ORDER — METHOCARBAMOL 500 MG PO TABS: 500.0000 mg | ORAL_TABLET | Freq: Four times a day (QID) | ORAL | 0 refills | Status: DC | PRN

## 2019-05-02 MED ORDER — DEXAMETHASONE SODIUM PHOSPHATE 10 MG/ML IJ SOLN
INTRAMUSCULAR | Status: AC
  Administered 2019-05-02: 8 mg via INTRAVENOUS
  Filled 2019-05-02: qty 1

## 2019-05-02 MED ORDER — METHOCARBAMOL 500 MG PO TABS
ORAL_TABLET | ORAL | Status: AC
  Filled 2019-05-02: qty 2

## 2019-05-02 MED ORDER — CELECOXIB 100 MG PO CAPS: 100.0000 mg | ORAL_CAPSULE | Freq: Two times a day (BID) | ORAL | 0 refills | Status: AC

## 2019-05-02 NOTE — Plan of Care (Signed)
Reinforced previous goals and expectations.

## 2019-05-02 NOTE — Progress Notes (Signed)
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  PMH includes: HTN, COPD, DM, arthritis, HLD, C4 corpectomy, and C3-C5 discectomy.    PT Comments    Pt pleasant and motivated to participate during the session.  Pt presented with grossly improved gait quality this session including improved cadence and B step length with pt reported subjectively that he felt steadier than during the AM session.  Pt presented with good stability during below static and dynamic balance/gait training with no instances of LOB noted.  Pt to follow surgeon's recommendations for follow up therapies.      Follow Up Recommendations  Follow surgeon's recommendation for DC plan and follow-up therapies     Equipment Recommendations  None recommended by PT    Recommendations for Other Services       Precautions / Restrictions Precautions Precautions: Cervical Precaution Booklet Issued: Yes (comment) Precaution Comments: No bending, arching, twisting, or lifting over 10lbs Required Braces or Orthoses: Cervical Brace Restrictions Weight Bearing Restrictions: No Other Position/Activity Restrictions: Cervical brace to be donned/doffed in sitting; brace may be off in bed, with amb to the bathroom, and with showering    Mobility  Bed Mobility Overal bed mobility: Modified Independent             General bed mobility comments: Extra time and effort but no physical assistance needed  Transfers Overall transfer level: Independent Equipment used: None             General transfer comment: Good eccentric and concentric control  Ambulation/Gait Ambulation/Gait assistance: Supervision Gait Distance (Feet): 300 Feet Assistive device:  None Gait Pattern/deviations: Step-through pattern;Decreased step length - right;Decreased step length - left Gait velocity: decreased   General Gait Details: Improved cadence and steady without LOB or buckling   Stairs             Wheelchair Mobility    Modified Rankin (Stroke Patients Only)       Balance Overall balance assessment: Independent                                          Cognition Arousal/Alertness: Awake/alert Behavior During Therapy: WFL for tasks assessed/performed Overall Cognitive Status: Within Functional Limits for tasks assessed                                        Exercises Other Exercises Other Exercises: Static and dynamic standing balance training with various foot positions and reaching outside BOS Other Exercises: Dynamic gait training with side stepping and backwards ambulation Other Exercises: Pt/spouse instructed in cervical precautions with emphasis on maintaining during ADL and ADL mobility, car transfers, and meal prep, handout provided to support recall and carryover Other Exercises: Pt/spouse instructed in compression stocking mgt    General Comments        Pertinent Vitals/Pain Pain Assessment: 0-10 Pain Score: 7  Pain Location: Neck Pain Descriptors / Indicators: Aching;Sore Pain Intervention(s): Premedicated before session;Monitored during session    [place] expects to be discharged to:: Private residence Living Arrangements: Spouse/significant other;Children Available Help  at Discharge: Family;Available 24 hours/day Type of Home: House Home Access: Level entry   Home Layout: Able to live on main level with bedroom/bathroom;Multi-level Home Equipment: Shower seat - built in;Walker - 2 wheels;Cane - single point Additional Comments: Comfort height toilet    Prior Function        Comments: Pt Ind with amb community distances without an AD, no fall history, Ind  with ADLs   PT Goals (current goals can now be found in the care plan section) Acute Rehab PT Goals Patient Stated Goal: return home to recover to independent PLOF with less neck pain Progress towards PT goals: Progressing toward goals    Frequency    BID      PT Plan Current plan remains appropriate    Co-evaluation              AM-PAC PT "6 Clicks" Mobility   Outcome Measure  Help needed turning from your back to your side while in a flat bed without using bedrails?: None Help needed moving from lying on your back to sitting on the side of a flat bed without using bedrails?: None Help needed moving to and from a bed to a chair (including a wheelchair)?: None Help needed standing up from a chair using your arms (e.g., wheelchair or bedside chair)?: None Help needed to walk in hospital room?: A Little Help needed climbing 3-5 steps with a railing? : A Little 6 Click Score: 22    End of Session Equipment Utilized During Treatment: Gait belt Activity Tolerance: Patient tolerated treatment well;No increased pain Patient left: in bed;with family/visitor present;with call bell/phone within reach Nurse Communication: Mobility status PT Visit Diagnosis: Pain;Difficulty in walking, not elsewhere classified (R26.2)     Time: [id number] PT Time Calculation (min) (ACUTE ONLY): 19 min  Charges:  $Therapeutic Exercise: 8-22 mins                    D. Royetta Asal PT, DPT 05/02/19, 4:44 PM

## 2019-05-02 NOTE — Discharge Summary (Signed)
Procedure: Anterior cervical plate removal, 624THL posterior fusion Procedure date: 05/01/2019 Diagnosis: Pseudoarthrosis   History: TAVIS VANKLEECK is s/p anterior cervical plate removal and 624THL posterior cervical fusion  POD1:He is recovering very well. Complains of posterior neck pain and mild difficulty swallowing but has improved since last night. Able to tolerate liquids and soft food without issue. Catheter was removed this AM and he was able to void without issue. Ambulated with nurse last night and this morning. Overall pain currently 8/10 but feels pain is adequately controlled on current regimen.  Anterior drain output overnight: 35 Posterior drain output overnight: 30  Evening  Drains:  Anterior 25  Posterior : minimal   Continues to do well. Eating, voiding, and ambulating without issue. Pain 7/10, adequately controlled on current pain regimen   POD0: Tolerated procedure well. Evaluated in post op recovery still disoriented from anesthesia but able to answer questions and obey commands.   Physical Exam: Vitals:   05/02/19 0600 05/02/19 1257  BP:  125/76  Pulse: (!) 102 (!) 101  Resp: 19   Temp: 98.6 F (37 C)   SpO2: 92% 93%   General: sitting on side of hospital bed, eating dinner. Collar present.  Strength:5/5 throughout upper and lower extremities Sensation: intact and symmetric throughout upper and lower extremities Skin: glue intact at anterior incision site. Dressing intact posterior cervical site. No active bleeding at drain insertion sites.  Data:  Recent Labs  Lab 04/29/19 0803  NA 134*  K 3.5  CL 102  CO2 20*  BUN 10  CREATININE 0.63  GLUCOSE 249*  CALCIUM 8.5*   No results for input(s): AST, ALT, ALKPHOS in the last 168 hours.  Invalid input(s): TBILI   Recent Labs  Lab 04/29/19 0803  WBC 9.5  HGB 14.6  HCT 40.6  PLT 401*   Recent Labs  Lab 04/29/19 0803  APTT 40*  INR 1.0         Other tests/results: EXAM: CERVICAL  SPINE - 2-3 VIEW 05/02/2019  FINDINGS: Remote anterior and interbody fusion changes from C3-C5 and interbody fusion at C5-6. Remote posterior wire fusion at C1-2.  Interval removal of the anterior plate and screws with subsequent posterior fusion hardware placement from C3-C6. Good position and alignment without complicating features.  IMPRESSION: Posterior fusion hardware from C3-C6 in good position without complicating features.  Assessment/Plan:  JOHARI SCHERFF is POD1 s/p anterior cervical plate removal with 624THL posterior cervical fusion. Patient is recovering well. Drains removed without issue. He has ambulated, voided, and eaten without issue. Pain adequately controlled on current pain regimen. Will continue post op pain management with tylenol, muscle relaxer, celebrex, and pain medication as needed. Steroid provided for any potential swallowing issues. Discussed and wound care. He is scheduled to follow up in clinic in approximately 2 weeks. Advised to contact office if any questions or concerns arise before then.    Marin Olp PA-C Department of Neurosurgery

## 2019-05-02 NOTE — Progress Notes (Signed)
Nsg Discharge Note  Admit Date:  05/01/2019 Discharge date: 05/02/2019   KHAMRON GELLERT to be D/C'd Home per MD order.  AVS completed.  Copy for chart, and copy for patient signed, and dated. Patient/caregiver able to verbalize understanding.  Discharge Medication: Allergies as of 05/02/2019      Reactions   Atorvastatin Other (See Comments)   A lot of cramping and joint pain   Hydromorphone Hcl Nausea And Vomiting      Medication List    TAKE these medications   albuterol 108 (90 Base) MCG/ACT inhaler Commonly known as: VENTOLIN HFA Inhale 2 puffs into the lungs every 6 (six) hours as needed for wheezing or shortness of breath.   blood glucose meter kit and supplies Kit Dispense based on patient and insurance preference. Use up to four times daily as directed. (FOR ICD-9 250.00, 250.01).   celecoxib 100 MG capsule Commonly known as: CeleBREX Take 1 capsule (100 mg total) by mouth 2 (two) times daily.   ezetimibe 10 MG tablet Commonly known as: ZETIA TAKE 1 TABLET (10 MG TOTAL) BY MOUTH DAILY.   fluticasone 50 MCG/ACT nasal spray Commonly known as: FLONASE PLACE 2 SPRAYS INTO BOTH NOSTRILS DAILY IN THE EVENING   folic acid 1 MG tablet Commonly known as: FOLVITE Take 1 mg by mouth daily.   HYDROcodone-acetaminophen 5-325 MG tablet Commonly known as: NORCO/VICODIN Take 1 tablet by mouth every 6 (six) hours as needed for severe pain. Must last 30 days.   HYDROcodone-acetaminophen 5-325 MG tablet Commonly known as: NORCO/VICODIN Take 1 tablet by mouth every 6 (six) hours as needed for severe pain. Must last 30 days. Start taking on: May 30, 2019   hydrOXYzine 25 MG tablet Commonly known as: ATARAX/VISTARIL Take 1 pill every 8 hours as needed for anxiety, can take 2 at bedtime to help sleep, don't take more than 5 a day   lisinopril 5 MG tablet Commonly known as: ZESTRIL Take 1 tablet (5 mg total) by mouth daily.   metFORMIN 500 MG 24 hr tablet Commonly known  as: GLUCOPHAGE-XR Take 2 tablets (1,000 mg total) by mouth 2 (two) times daily.   methocarbamol 500 MG tablet Commonly known as: ROBAXIN Take 1-2 tablets (500-1,000 mg total) by mouth every 6 (six) hours as needed for muscle spasms. What changed: See the new instructions.   methotrexate 2.5 MG tablet Commonly known as: RHEUMATREX Take 5 mg by mouth once a week. Thursday.   methylPREDNISolone 4 MG Tbpk tablet Commonly known as: MEDROL DOSEPAK Follow package directions   oxyCODONE 5 MG immediate release tablet Commonly known as: Oxy IR/ROXICODONE Take 1-2 tablets (5-10 mg total) by mouth every 4 (four) hours as needed for up to 7 days for severe pain or breakthrough pain.   pregabalin 50 MG capsule Commonly known as: Lyrica Take 1-2 capsules (50-100 mg total) by mouth at bedtime.   Vitamin D (Ergocalciferol) 1.25 MG (50000 UNIT) Caps capsule Commonly known as: DRISDOL Take 50,000 Units by mouth every 7 (seven) days. Mondays       Discharge Assessment: Vitals:   05/02/19 0600 05/02/19 1257  BP:  125/76  Pulse: (!) 102 (!) 101  Resp: 19   Temp: 98.6 F (37 C)   SpO2: 92% 93%   Skin clean, dry and intact without evidence of skin break down, no evidence of skin tears noted. IV catheter discontinued intact. Site without signs and symptoms of complications - no redness or edema noted at insertion site, patient denies  c/o pain - only slight tenderness at site.  Dressing with slight pressure applied.  D/c Instructions-Education: Discharge instructions given to patient/family with verbalized understanding. D/c education completed with patient/family including follow up instructions, medication list, d/c activities limitations if indicated, with other d/c instructions as indicated by MD - patient able to verbalize understanding, all questions fully answered. Patient instructed to return to ED, call 911, or call MD for any changes in condition.  Patient escorted via [place], and D/C home  via private auto.  Tresa Endo, RN 05/02/2019 6:41 PM

## 2019-05-02 NOTE — Discharge Instructions (Signed)
NEUROSURGERY DISCHARGE INSTRUCTIONS  The following are instructions to help in your recovery once you have been discharged from the hospital. Even if you feel well, it is important that you follow these activity guidelines.  What to do after you leave the hospital:  Recommended diet:  Increase protein intake to promote wound healing. You may return to your usual diet. However, you may experience discomfort when swallowing in the first month after your surgery. This is normal. You may find that softer foods are more comfortable for you to swallow. Be sure to stay hydrated.   Recommended activity: No bending, lifting, or twisting ("BLT"). Avoid lifting objects heavier than 10 pounds (gallon milk jug). Where possible, avoid household activities that involve lifting, bending, reaching, pushing, or pulling such as laundry, vacuuming, grocery shopping, and childcare. Try to arrange for help from friends and family for these activities while you heal.   Increase physical activity slowly as tolerated. Taking short walks is encouraged, but avoid strenuous exercise. Do not jog, run, bicycle, lift weights, or participate in any other exercises unless specifically allowed by your doctor.   You should not drive until cleared by your doctor.   Until released by your doctor, you should not return to work or school. You should rest at home and let your body heal.   You may shower the day after your surgery. After showering, lightly dab your incision dry. Do not take a tub bath or go swimming until approved by your doctor at your follow-up appointment.   If you smoke, we strongly recommend that you quit. Smoking has been proven to interfere with normal bone healing and will dramatically reduce the success rate of your surgery. Please contact [facility] (800-QUIT-NOW) and use the resources at www.[facility].com for assistance in stopping smoking.   Medications  Do not restart Aspirin until seven days after  surgery  * Do not take anti-inflammatory medications for 3 days after surgery (naproxen [Aleve], ibuprofen [Advil, Motrin] You may restart home medications.   Wound Care Instructions  If you have a dressing on your incision, remove it three days after your surgery. Keep your incision area clean and dry.   If you have staples or stitches on your incision, you should have a follow up scheduled for removal. If you do not have staples or stitches, you will have steri-strips (small pieces of surgical tape) or Dermabond glue. The steri-strips/glue should begin to peel away within about a week (it is fine if the steri-strips fall off before then). If the strips are still in place one week after your surgery, you may gently remove them.    Please Report any of the following: Should you experience any of the following, contact us immediately:   New numbness or weakness   Pain that is progressively getting worse, and is not relieved by your pain medication, muscle relaxers, rest, and warm compresses   Bleeding, redness, swelling, pain, or drainage from surgical incision   Chills or flu-like symptoms   Fever greater than 101.0 F (38.3 C)   Inability to eat, drink fluids, or take medications   Problems with bowel or bladder functions   Difficulty breathing or shortness of breath   Warmth, tenderness, or swelling in your calf    Additional Follow up appointments During office hours (Monday-Friday 9 am to 5 pm), please call your physician at [phone] and ask for Berdine Addison.   After hours and weekends, please call [phone] and an answering service will put you in  touch with either Dr. Lacinda Axon or Dr. Izora Ribas.   For a life-threatening emergency, call 911

## 2019-05-02 NOTE — Progress Notes (Signed)
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  PMH includes: HTN, COPD, DM, arthritis, HLD, C4 corpectomy, and C3-C5 discectomy.   Clinical Impression   Pt seen for OT evaluation this date, POD#1 from above cervical surgery. Prior to hospital admission, pt was independent with mobility, ADL, and IADL. Pt lives with spouse in a single family home with adequate supports for safe return home. Currently pt is modified independent with bed mobility, supervision for ADL mobility, and PRN verbal cues to CGA for sit to stand ADL tasks with cues for adhering to cervical precautions. Cervical brace noted to be slightly loose, adjusted with improved positioning and support. Pt/spouse educated in cervical precautions, self care skills, AE, and home/routines modifications to maximize safety and functional independence while minimizing falls risk and maintaining precautions. Handout provided to support recall and carryover. Pt/spouse verbalized understanding of all education/training provided. Pt would benefit from additional skilled OT while hospitalized to maximize understanding and adherence to precautions during ADL. Do not anticipate skilled OT needs following hospitalization.     Follow Up Recommendations  No OT follow up    Equipment Recommendations  None recommended by OT    Recommendations for Other Services       Precautions / Restrictions Precautions Precautions: Cervical Precaution Booklet Issued: Yes (comment) Precaution Comments: No bending, arching, twisting, or lifting over 10lbs Required Braces or Orthoses: Cervical Brace Restrictions Weight Bearing Restrictions: No Other Position/Activity Restrictions:  Cervical brace to be donned/doffed in sitting; brace may be off in bed, with amb to the bathroom, and with showering      Mobility Bed Mobility Overal bed mobility: Modified Independent             General bed mobility comments: able to indeoendently use learned log roll technique  Transfers Overall transfer level: Independent               General transfer comment: Good eccentric and concentric control    Balance Overall balance assessment: Independent                                         ADL either performed or assessed with clinical judgement   ADL Overall ADL's : Modified independent                                       General ADL Comments: Supervision for safety for STS ADL tasks, no significant balance deficits appreciated, cues for body mechanics to minimize strain on cervical spine and maintain cervical precautions     Vision Patient Visual Report: No change from baseline       Perception     Praxis      Pertinent Vitals/Pain Pain Assessment: 0-10 Pain Score: 8  Pain Location: Neck Pain Descriptors / Indicators: Aching;Sore Pain Intervention(s): Limited activity within patient's tolerance;Monitored during session;Repositioned;Premedicated before session     Hand Dominance Right   Extremity/Trunk Assessment Upper Extremity Assessment Upper Extremity Assessment: Overall WFL for tasks assessed(no strength, coordination, or sensory deficits appreciated) RUE Deficits / Details: RUE elbow and wrist strength WNL  RUE Sensation: WNL RUE Coordination: WNL LUE Deficits / Details: LUE elbow and wrist strength WNL LUE Sensation: WNL LUE Coordination: WNL   Lower Extremity Assessment Lower Extremity Assessment: Overall WFL for tasks assessed RLE Deficits / Details: Hip, knee, and ankle strength 5/5 RLE Sensation: WNL RLE Coordination: WNL LLE Deficits / Details: Hip, knee, and ankle strength 5/5 LLE Sensation:  WNL LLE Coordination: WNL       Communication Communication Communication: No difficulties   Cognition Arousal/Alertness: Awake/alert Behavior During Therapy: WFL for tasks assessed/performed Overall Cognitive Status: Within Functional Limits for tasks assessed                                     General Comments  Pt steady with side stepping, feet apart eyes closed, and feet together eyes closed all without UE support    Exercises Other Exercises Other Exercises: Pt education on cervical precautions including during functional activity Other Exercises: Static and dynamic balance training Other Exercises: Dynamic gait training with start/stops and 90 deg turns Other Exercises: Pt/spouse instructed in cervical precautions with emphasis on maintaining during ADL and ADL mobility, car transfers, and meal prep, handout provided to support recall and carryover Other Exercises: Pt/spouse instructed in compression stocking mgt   Shoulder Instructions      Home Living Family/patient expects to be discharged to:: Private residence Living Arrangements: Spouse/significant other;Children;Other relatives Available Help at Discharge: Family;Available 24 hours/day Type of Home: House Home Access: Level entry     Home Layout: Able to live on main level with bedroom/bathroom;Multi-level     Bathroom Shower/Tub: Occupational psychologist: Standard     Home Equipment: Shower seat - built in;Walker - 2 wheels;Cane - single point   Additional Comments: Comfort height toilet      Prior Functioning/Environment          Comments: Pt Ind with amb community distances without an AD, no fall history, Ind with ADLs        OT Problem List: Pain;Decreased range of motion;Decreased knowledge of precautions      OT Treatment/Interventions: Self-care/ADL training;Therapeutic exercise;Therapeutic activities;Patient/family education    OT Goals(Current goals can be  found in the care plan section) Acute Rehab OT Goals Patient Stated Goal: return home to recover to independent PLOF with less neck pain OT Goal Formulation: With patient/family Time For Goal Achievement: 05/16/19 Potential to Achieve Goals: Good ADL Goals Pt Will Perform Lower Body Dressing: with modified independence;sit to/from stand(maintaining cervical precautions) Pt Will Transfer to Toilet: with modified independence;ambulating(comfort height toilet, maintaining cervical precautions) Additional ADL Goal #1: Pt will independently instruct family in compression stocking mgt Additional ADL Goal #2: Pt will independently instruct spouse in cervical brace mgt including positioning, wear schedule, and donning/doffing.  OT Frequency: Min 1X/week   Barriers to D/C:            Co-evaluation              AM-PAC OT "6 Clicks" Daily Activity     Outcome Measure Help from another person eating meals?: None Help from another person taking care of personal grooming?: None Help from another person toileting, which includes using toliet, bedpan, or urinal?: A Little Help from another person bathing (including washing, rinsing, drying)?: A Little Help from another person to put on and taking off regular upper body clothing?: None Help from another person to put on and taking  off regular lower body clothing?: A Little 6 Click Score: 21   End of Session    Activity Tolerance: Patient tolerated treatment well Patient left: in bed;with call bell/phone within reach;with family/visitor present  OT Visit Diagnosis: Other abnormalities of gait and mobility (R26.89)                Time: [id number] OT Time Calculation (min): 20 min Charges:  OT General Charges $OT Visit: 1 Visit OT Evaluation $OT Eval Low Complexity: 1 Low OT Treatments $Self Care/Home Management : 8-22 mins  Jeni Salles, MPH, MS, OTR/L ascom [phone] 05/02/19, 1:37 PM

## 2019-05-02 NOTE — OR Nursing (Signed)
Rechecked BS- 317.  Patient agreeable to receiving 11 units of Novolog, given.

## 2019-05-02 NOTE — OR Nursing (Signed)
Blood glucose checked after patient asked if was not going to get his evening dose of Metformin.  BS-[id number].  Metformin given per patient request instead of his sliding scale insulin.  Will recheck BS later on in night.

## 2019-05-02 NOTE — Evaluation (Signed)
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  PMH includes: HTN, COPD, DM, arthritis, HLD, C4 corpectomy, and C3-C5 discectomy.    Clinical Impression  Pt pleasant and motivated to participate during the session.  Pt demonstrated WNL strength throughout with no BUE/BLE deficits in sensation to light touch or proprioception noted.  Pt reported feeling "a little unsteady" upon initially coming to standing and occasionally during amb but did not present with any instances of LOB during the session.  Pt/spouse educated on cervical precautions as well as proper positioning for donning/doffing cervical brace.  Pt reported that he did not require HHPT upon discharge but that he would follow future recommendations for rehab from his surgeon.  Will keep patient on PT caseload while in acute care to prevent functional decline and to continue to progress towards acute care goals.        Follow Up Recommendations Follow surgeon's recommendation for DC plan and follow-up therapies    Equipment Recommendations  None recommended by PT    Recommendations for Other Services       Precautions / Restrictions Precautions Precautions: Cervical Precaution Comments: No bending, arching, twisting Required Braces or Orthoses: Cervical Brace Restrictions Weight Bearing Restrictions: No Other Position/Activity Restrictions: Cervical brace to be donned/doffed in sitting; brace may be off in bed, with amb to the bathroom, and with showering      Mobility  Bed Mobility Overal bed mobility: Independent                Transfers Overall transfer level: Independent               General transfer comment: Good eccentric and  concentric control  Ambulation/Gait Ambulation/Gait assistance: Supervision Gait Distance (Feet): 200 Feet Assistive device: None Gait Pattern/deviations: Step-through pattern;Decreased step length - right;Decreased step length - left Gait velocity: decreased   General Gait Details: Decreased cadence but steady without LOB including during 90 deg turns and with side stepping  Stairs            Wheelchair Mobility    Modified Rankin (Stroke Patients Only)       Balance Overall balance assessment: Independent                                           Pertinent Vitals/Pain Pain Assessment: 0-10 Pain Score: 7  Pain Location: Neck Pain Descriptors / Indicators: Aching;Sore Pain Intervention(s): Premedicated before session;Monitored during session    [place] expects to be discharged to:: Private residence Living Arrangements: Spouse/significant other;Children;Other relatives Available Help at Discharge: Family;Available 24 hours/day Type of Home: House Home Access: Level entry     Home Layout: Able to live on main level with bedroom/bathroom;Multi-level Home Equipment: Shower seat - built in;Walker - 2 wheels;Cane - single point Additional Comments: Comfort height toilet    Prior Function           Comments: Pt Ind with amb community distances without an AD, no fall history, Ind with ADLs     Hand Dominance   Dominant Hand: Right    Extremity/Trunk Assessment   Upper Extremity Assessment Upper Extremity Assessment: RUE deficits/detail;LUE  deficits/detail;Defer to OT evaluation;Overall WFL for tasks assessed RUE Deficits / Details: RUE elbow and wrist strength WNL RUE Sensation: WNL RUE Coordination: WNL LUE Deficits / Details: LUE elbow and wrist strength WNL LUE Sensation: WNL LUE Coordination: WNL    Lower Extremity Assessment Lower Extremity Assessment: Overall WFL for tasks assessed;LLE deficits/detail;RLE  deficits/detail RLE Deficits / Details: Hip, knee, and ankle strength 5/5 RLE Sensation: WNL RLE Coordination: WNL LLE Deficits / Details: Hip, knee, and ankle strength 5/5 LLE Sensation: WNL LLE Coordination: WNL       Communication   Communication: No difficulties  Cognition Arousal/Alertness: Awake/alert Behavior During Therapy: WFL for tasks assessed/performed Overall Cognitive Status: Within Functional Limits for tasks assessed                                        General Comments General comments (skin integrity, edema, etc.): Pt steady with side stepping, feet apart eyes closed, and feet together eyes closed all without UE support    Exercises Other Exercises Other Exercises: Pt education on cervical precautions including during functional activity Other Exercises: Static and dynamic balance training Other Exercises: Dynamic gait training with start/stops and 90 deg turns   Assessment/Plan    PT Assessment Patient needs continued PT services  PT Problem List Decreased balance;Decreased activity tolerance;Pain;Decreased knowledge of precautions       PT Treatment Interventions DME instruction;Gait training;Functional mobility training;Therapeutic activities;Therapeutic exercise;Balance training;Patient/family education    PT Goals (Current goals can be found in the Care Plan section)  Acute Rehab PT Goals Patient Stated Goal: To get home into my own environment PT Goal Formulation: With patient Time For Goal Achievement: 05/15/19 Potential to Achieve Goals: Good    Frequency BID   Barriers to discharge        Co-evaluation               AM-PAC PT "6 Clicks" Mobility  Outcome Measure Help needed turning from your back to your side while in a flat bed without using bedrails?: None Help needed moving from lying on your back to sitting on the side of a flat bed without using bedrails?: None Help needed moving to and from a bed to a  chair (including a wheelchair)?: None Help needed standing up from a chair using your arms (e.g., wheelchair or bedside chair)?: None Help needed to walk in hospital room?: A Little Help needed climbing 3-5 steps with a railing? : A Little 6 Click Score: 22    End of Session Equipment Utilized During Treatment: Gait belt Activity Tolerance: Patient tolerated treatment well;No increased pain Patient left: in bed;Other (comment)(Pt left with OT for OT evaluation) Nurse Communication: Mobility status PT Visit Diagnosis: Pain;Difficulty in walking, not elsewhere classified (R26.2) Pain - part of body: (neck)    Time: [id number] PT Time Calculation (min) (ACUTE ONLY): 23 min   Charges:   PT Evaluation $PT Eval Moderate Complexity: 1 Mod PT Treatments $Therapeutic Activity: 8-22 mins        D. Royetta Asal PT, DPT 05/02/19, 12:06 PM

## 2019-05-02 NOTE — Progress Notes (Signed)
Inpatient Diabetes Program Recommendations  AACE/ADA: New Consensus Statement on Inpatient Glycemic Control   Target Ranges:  Prepandial:   less than 140 mg/dL      Peak postprandial:   less than 180 mg/dL (1-2 hours)      Critically ill patients:  140 - 180 mg/dL  Results for [place], Vikash S "JEFF" (MRN [id number]) as of 05/02/2019 12:48  Ref. Range 05/01/2019 11:18 05/01/2019 17:06 05/01/2019 23:23 05/02/2019 01:37 05/02/2019 08:22  Glucose-Capillary Latest Ref Range: 70 - 99 mg/dL 202 (H) 241 (H) 410 (H) 317 (H) 377 (H)    Review of Glycemic Control  Diabetes history: DM2 Outpatient Diabetes medications: Metformin XR 1000 mg BID Current orders for Inpatient glycemic control: Metformin XR 1000 mg BID, Novolog 0-15 units TID with meals; Decadron 8 mg Q6H  Inpatient Diabetes Program Recommendations:   Insulin - Basal: If steroids are continued, please consider ordering Lantus 15 units Q24H (to start now).  Correction (SSI): Please consider adding Novolog 0-5 unis QHS for bedtime correction.  Insulin - Meal Coverage: If steroids are continued, please consider ordering Novolog 4 units TID with meals for meal coverage if patient eats at least 50% of meals.  Thanks, Barnie Alderman, RN, MSN, CDE Diabetes Coordinator Inpatient Diabetes Program [phone] (Team Pager from 8am to 5pm)

## 2019-05-02 NOTE — Progress Notes (Signed)
Procedure: Anterior cervical plate removal, 624THL posterior fusion Procedure date: 05/01/2019 Diagnosis: Pseudoarthrosis   History: RAYSHON GRUDZINSKI is s/p anterior cervical plate removal and 624THL posterior cervical fusion  POD1:He is recovering very well. Complains of posterior neck pain and mild difficulty swallowing but has improved since last night. Able to tolerate liquids and soft food without issue. Catheter was removed this AM and he was able to void without issue. Ambulated with nurse last night and this morning. Overall pain currently 8/10 but feels pain is adequately controlled on current regimen.  Anterior drain output overnight: 35 Posterior drain output overnight: 30   POD0: Tolerated procedure well. Evaluated in post op recovery still disoriented from anesthesia but able to answer questions and obey commands.   Physical Exam: Vitals:   05/02/19 0400 05/02/19 0600  BP: (!) 143/81   Pulse: (!) 101 (!) 102  Resp: 20 19  Temp: 98 F (36.7 C) 98.6 F (37 C)  SpO2: 94% 92%   General: sitting on side of hospital bed, eating breakfast. Collar present.  Strength:5/5 throughout upper and lower extremities Sensation: intact and symmetric throughout upper and lower extremities  Data:  Recent Labs  Lab 04/29/19 0803  NA 134*  K 3.5  CL 102  CO2 20*  BUN 10  CREATININE 0.63  GLUCOSE 249*  CALCIUM 8.5*   No results for input(s): AST, ALT, ALKPHOS in the last 168 hours.  Invalid input(s): TBILI   Recent Labs  Lab 04/29/19 0803  WBC 9.5  HGB 14.6  HCT 40.6  PLT 401*   Recent Labs  Lab 04/29/19 0803  APTT 40*  INR 1.0         Other tests/results: Cervical xrays pending   Assessment/Plan:  ROUDY BONNETTE is POD0 s/p anterior cervical plate removal with 624THL posterior cervical fusion. Will continue to monitor  - monitor drain output -2 drains -JP anterior, Hemovac posterior - mobilize - pain control - DVT prophylaxis - PTOT - Brace -patient  supplied - Imaging -pending   Marin Olp PA-C Department of Neurosurgery

## 2019-05-03 ENCOUNTER — Other Ambulatory Visit: Payer: 59

## 2019-05-03 NOTE — Anesthesia Postprocedure Evaluation (Signed)
Anesthesia Post Note  Patient: Tony Cardenas  Procedure(s) Performed: ANTERIOR CERVICAL CORPECTOMY C4, CERVICALHARDWARE REMOVAL (N/A ) C3-6 POSTERIOR FUSION (N/A )  Patient location during evaluation: PACU Anesthesia Type: General Level of consciousness: awake and alert Pain management: pain level controlled Vital Signs Assessment: post-procedure vital signs reviewed and stable Respiratory status: spontaneous breathing, nonlabored ventilation, respiratory function stable and patient connected to nasal cannula oxygen Cardiovascular status: blood pressure returned to baseline and stable Postop Assessment: no apparent nausea or vomiting Anesthetic complications: no     Last Vitals:  Vitals:   05/02/19 1257 05/02/19 1858  BP: 125/76 (!) 146/95  Pulse: (!) 101 98  Resp:  20  Temp:    SpO2: 93% 94%    Last Pain:  Vitals:   05/02/19 1810  TempSrc:   PainSc: 4                  Molli Barrows

## 2019-05-06 ENCOUNTER — Other Ambulatory Visit: Payer: Self-pay | Admitting: *Deleted

## 2019-05-06 ENCOUNTER — Other Ambulatory Visit: Payer: 59

## 2019-05-06 ENCOUNTER — Encounter: Payer: Self-pay | Admitting: *Deleted

## 2019-05-06 NOTE — Patient Outreach (Addendum)
[place] [facility]) Care Management  05/06/2019  Tony Cardenas 16-Jul-1970 [id number]   Transition of care call/case closure   Referral received:04/30/19 Initial outreach:05/06/19 Insurance: [place] [employer]    Subjective: Initial successful telephone call to patient's preferred number in order to complete transition of care assessment; 2 HIPAA identifiers verified. Explained purpose of call and completed transition of care assessment.  States that he is doing pretty good denies post-operative problems, says surgical incisions are unremarkable, states surgical pain well managed with prescribed medications, states that his wife is helping him with managing. He reports  tolerating diet, denies bowel or bladder problems.  Spouse, son, mother in law  are assisting with his recovery. He discussed tolerating mobility in home, with activity restrictions.   He discussed ongoing health issues of diabetes, hypertension , discussed [place] chronic condition management program states that he is managing okay on metformin , but  he is agreeable to referral for additional support.  He discussed being active with [facility] wellness program in the past , noted recent elevation in Hemoglobin A1c to 8.3%, reports having a meter at home but not monitoring blood sugars currently. He reports no longer smoking cigarettes   He uses a [facility] outpatient pharmacy at [employer].     Objective:   [name]  was hospitalized at [facility] from 4/21-4/22/21  For Anterior Cervical  Plate removal , 624THL posterior fusion Comorbidities include: Type 2 DM, Hypertension , COPD, GERD, arthritis, renal calculus, hyperlipidemia, cervical discectomy  He was discharged to home on 05/02/19 without the need for home health services or DME.   Assessment:  Patient voices good understanding of all discharge instructions.  See transition of care flowsheet for assessment details.   Plan:  Reviewed  hospital discharge diagnosis of anterior cervical corpectomy, C4,    and discharge treatment plan using hospital discharge instructions, assessing medication adherence, reviewing problems requiring provider notification, and discussing the importance of follow up with surgeon, primary care provider and/or specialists as directed. Using [employer] website, after patient agreement, enrolled patient to  participate in [place] [employer] [facility] chronic disease management program.    No ongoing care management needs identified so will close case to [place] Management services and route successful outreach letter with [place] Management pamphlet and 24 Hour Nurse Line Magnet to [place] Management clinical pool to be mailed to patient's home address.   Joylene Draft, RN, BSN  [place] Management Coordinator  [phone]- Mobile [phone]- Toll Free Main Office

## 2019-06-13 ENCOUNTER — Other Ambulatory Visit: Payer: Self-pay

## 2019-06-13 ENCOUNTER — Encounter: Payer: Self-pay | Admitting: Family Medicine

## 2019-06-13 ENCOUNTER — Ambulatory Visit (INDEPENDENT_AMBULATORY_CARE_PROVIDER_SITE_OTHER): Payer: 59 | Admitting: Family Medicine

## 2019-06-13 ENCOUNTER — Other Ambulatory Visit: Payer: Self-pay | Admitting: Family Medicine

## 2019-06-13 VITALS — BP 125/72 | HR 107 | Temp 98.7°F | Ht 69.88 in | Wt 245.0 lb

## 2019-06-13 DIAGNOSIS — Z1211 Encounter for screening for malignant neoplasm of colon: Secondary | ICD-10-CM

## 2019-06-13 DIAGNOSIS — J449 Chronic obstructive pulmonary disease, unspecified: Secondary | ICD-10-CM | POA: Diagnosis not present

## 2019-06-13 DIAGNOSIS — D75839 Thrombocytosis, unspecified: Secondary | ICD-10-CM

## 2019-06-13 DIAGNOSIS — I10 Essential (primary) hypertension: Secondary | ICD-10-CM | POA: Diagnosis not present

## 2019-06-13 DIAGNOSIS — Z0000 Encounter for general adult medical examination without abnormal findings: Secondary | ICD-10-CM

## 2019-06-13 DIAGNOSIS — R7989 Other specified abnormal findings of blood chemistry: Secondary | ICD-10-CM

## 2019-06-13 DIAGNOSIS — Z125 Encounter for screening for malignant neoplasm of prostate: Secondary | ICD-10-CM

## 2019-06-13 DIAGNOSIS — E782 Mixed hyperlipidemia: Secondary | ICD-10-CM | POA: Diagnosis not present

## 2019-06-13 DIAGNOSIS — Z981 Arthrodesis status: Secondary | ICD-10-CM | POA: Diagnosis not present

## 2019-06-13 DIAGNOSIS — E1165 Type 2 diabetes mellitus with hyperglycemia: Secondary | ICD-10-CM

## 2019-06-13 DIAGNOSIS — K219 Gastro-esophageal reflux disease without esophagitis: Secondary | ICD-10-CM | POA: Diagnosis not present

## 2019-06-13 DIAGNOSIS — M960 Pseudarthrosis after fusion or arthrodesis: Secondary | ICD-10-CM | POA: Diagnosis not present

## 2019-06-13 DIAGNOSIS — D473 Essential (hemorrhagic) thrombocythemia: Secondary | ICD-10-CM

## 2019-06-13 DIAGNOSIS — E559 Vitamin D deficiency, unspecified: Secondary | ICD-10-CM | POA: Diagnosis not present

## 2019-06-13 DIAGNOSIS — M4322 Fusion of spine, cervical region: Secondary | ICD-10-CM | POA: Diagnosis not present

## 2019-06-13 LAB — URINALYSIS, ROUTINE W REFLEX MICROSCOPIC
Bilirubin, UA: NEGATIVE
Leukocytes,UA: NEGATIVE
Nitrite, UA: NEGATIVE
Protein,UA: NEGATIVE
RBC, UA: NEGATIVE
Specific Gravity, UA: 1.02 (ref 1.005–1.030)
Urobilinogen, Ur: 0.2 mg/dL (ref 0.2–1.0)
pH, UA: 5 (ref 5.0–7.5)

## 2019-06-13 LAB — MICROALBUMIN, URINE WAIVED
Creatinine, Urine Waived: 100 mg/dL (ref 10–300)
Microalb, Ur Waived: 30 mg/L — ABNORMAL HIGH (ref 0–19)
Microalb/Creat Ratio: <30 mg/g (ref <30)

## 2019-06-13 LAB — BAYER DCA HB A1C WAIVED: HB A1C (BAYER DCA - WAIVED): 7.6 % — ABNORMAL HIGH (ref <7.0)

## 2019-06-13 MED ORDER — LISINOPRIL 5 MG PO TABS: 5.0000 mg | ORAL_TABLET | Freq: Every day | ORAL | 1 refills | Status: DC

## 2019-06-13 MED ORDER — METHOCARBAMOL 500 MG PO TABS: 500.0000 mg | ORAL_TABLET | Freq: Four times a day (QID) | ORAL | 1 refills | Status: DC | PRN

## 2019-06-13 MED ORDER — EZETIMIBE 10 MG PO TABS: 10.0000 mg | ORAL_TABLET | Freq: Every day | ORAL | 1 refills | Status: DC

## 2019-06-13 MED ORDER — FLUTICASONE PROPIONATE 50 MCG/ACT NA SUSP: NASAL | 12 refills | Status: DC

## 2019-06-13 MED ORDER — METFORMIN HCL ER 500 MG PO TB24: 1000.0000 mg | ORAL_TABLET | Freq: Two times a day (BID) | ORAL | 1 refills | Status: DC

## 2019-06-13 MED ORDER — ALBUTEROL SULFATE HFA 108 (90 BASE) MCG/ACT IN AERS: 2.0000 | INHALATION_SPRAY | Freq: Four times a day (QID) | RESPIRATORY_TRACT | 3 refills | Status: DC | PRN

## 2019-06-13 NOTE — Patient Instructions (Signed)

## 2019-06-13 NOTE — Assessment & Plan Note (Signed)
Rechecking labs today. Await results. Call with any concerns.  

## 2019-06-13 NOTE — Assessment & Plan Note (Signed)
Rechecking labs today. Await results. Treat as needed.  °

## 2019-06-13 NOTE — Assessment & Plan Note (Signed)
Under good control on current regimen. Continue current regimen. Continue to monitor. Call with any concerns. Refills given. Labs drawn today.   

## 2019-06-13 NOTE — Assessment & Plan Note (Signed)
Still running a little high with A1c of 7.6. Had been on steroids. Will stay on current regimen and continue to monitor. Recheck 3 months. If still >7 will add jardiance. Continue to monitor.
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
## 2019-06-13 NOTE — Assessment & Plan Note (Signed)
Labs drawn today. Await results. Call with any concerns.  

## 2019-06-14 LAB — COMPREHENSIVE METABOLIC PANEL
ALT: 22 IU/L (ref 0–44)
AST: 20 IU/L (ref 0–40)
Albumin/Globulin Ratio: 2 (ref 1.2–2.2)
Albumin: 4.5 g/dL (ref 4.0–5.0)
Alkaline Phosphatase: 92 IU/L (ref 48–121)
BUN/Creatinine Ratio: 15 (ref 9–20)
BUN: 11 mg/dL (ref 6–24)
Bilirubin Total: 0.2 mg/dL (ref 0.0–1.2)
CO2: 18 mmol/L — ABNORMAL LOW (ref 20–29)
Calcium: 9.4 mg/dL (ref 8.7–10.2)
Chloride: 98 mmol/L (ref 96–106)
Creatinine, Ser: 0.72 mg/dL — ABNORMAL LOW (ref 0.76–1.27)
GFR calc Af Amer: 126 mL/min/{1.73_m2} (ref >59)
GFR calc non Af Amer: 109 mL/min/{1.73_m2} (ref >59)
Globulin, Total: 2.3 g/dL (ref 1.5–4.5)
Glucose: 235 mg/dL — ABNORMAL HIGH (ref 65–99)
Potassium: 4.3 mmol/L (ref 3.5–5.2)
Sodium: 137 mmol/L (ref 134–144)
Total Protein: 6.8 g/dL (ref 6.0–8.5)

## 2019-06-14 LAB — PSA: Prostate Specific Ag, Serum: 0.3 ng/mL (ref 0.0–4.0)

## 2019-06-14 LAB — CBC WITH DIFFERENTIAL/PLATELET
Basophils Absolute: 0.1 10*3/uL (ref 0.0–0.2)
Basos: 1 %
EOS (ABSOLUTE): 0.2 10*3/uL (ref 0.0–0.4)
Eos: 1 %
Hematocrit: 45.4 % (ref 37.5–51.0)
Hemoglobin: 15.4 g/dL (ref 13.0–17.7)
Immature Grans (Abs): 0.1 10*3/uL (ref 0.0–0.1)
Immature Granulocytes: 1 %
Lymphocytes Absolute: 3.2 10*3/uL — ABNORMAL HIGH (ref 0.7–3.1)
Lymphs: 27 %
MCH: 31.8 pg (ref 26.6–33.0)
MCHC: 33.9 g/dL (ref 31.5–35.7)
MCV: 94 fL (ref 79–97)
Monocytes Absolute: 0.5 10*3/uL (ref 0.1–0.9)
Monocytes: 5 %
Neutrophils Absolute: 7.5 10*3/uL — ABNORMAL HIGH (ref 1.4–7.0)
Neutrophils: 65 %
Platelets: 435 10*3/uL (ref 150–450)
RBC: 4.85 x10E6/uL (ref 4.14–5.80)
RDW: 13.4 % (ref 11.6–15.4)
WBC: 11.6 10*3/uL — ABNORMAL HIGH (ref 3.4–10.8)

## 2019-06-14 LAB — LIPID PANEL W/O CHOL/HDL RATIO
Cholesterol, Total: 225 mg/dL — ABNORMAL HIGH (ref 100–199)
HDL: 25 mg/dL — ABNORMAL LOW (ref >39)
Triglycerides: 1342 mg/dL (ref 0–149)

## 2019-06-14 LAB — TSH: TSH: 0.513 u[IU]/mL (ref 0.450–4.500)

## 2019-06-14 LAB — VITAMIN D 25 HYDROXY (VIT D DEFICIENCY, FRACTURES)

## 2019-06-18 ENCOUNTER — Telehealth: Payer: 59

## 2019-06-20 ENCOUNTER — Encounter: Payer: Self-pay | Admitting: Family Medicine

## 2019-06-25 ENCOUNTER — Ambulatory Visit
Payer: 59 | Attending: Student in an Organized Health Care Education/Training Program | Admitting: Student in an Organized Health Care Education/Training Program

## 2019-06-25 ENCOUNTER — Encounter: Payer: Self-pay | Admitting: Student in an Organized Health Care Education/Training Program

## 2019-06-25 ENCOUNTER — Other Ambulatory Visit: Payer: Self-pay

## 2019-06-25 DIAGNOSIS — G894 Chronic pain syndrome: Secondary | ICD-10-CM | POA: Diagnosis not present

## 2019-06-25 DIAGNOSIS — Q761 Klippel-Feil syndrome: Secondary | ICD-10-CM | POA: Diagnosis not present

## 2019-06-25 DIAGNOSIS — M5412 Radiculopathy, cervical region: Secondary | ICD-10-CM | POA: Diagnosis not present

## 2019-06-25 MED ORDER — HYDROCODONE-ACETAMINOPHEN 5-325 MG PO TABS: 1.0000 | ORAL_TABLET | Freq: Four times a day (QID) | ORAL | 0 refills | Status: DC | PRN

## 2019-06-25 MED ORDER — HYDROCODONE-ACETAMINOPHEN 5-325 MG PO TABS: 1.0000 | ORAL_TABLET | Freq: Four times a day (QID) | ORAL | 0 refills | Status: AC | PRN

## 2019-06-25 NOTE — Progress Notes (Signed)
Nursing Pain Medication Assessment:  Safety precautions to be maintained throughout the outpatient stay will include: orient to surroundings, keep bed in low position, maintain call bell within reach at all times, provide assistance with transfer out of bed and ambulation.  Medication Inspection Compliance: Pill count conducted under aseptic conditions, in front of the patient. Neither the pills nor the bottle was removed from the patient's sight at any time. Once count was completed pills were immediately returned to the patient in their original bottle.  Medication: Hydrocodone/APAP Pill/Patch Count: 45 of 120 pills remain Pill/Patch Appearance: Markings consistent with prescribed medication Bottle Appearance: Standard pharmacy container. Clearly labeled. Filled Date: 05 / 24 / 2021 Last Medication intake:  Has not run out    Safety precautions to be maintained throughout the outpatient stay will include: orient to surroundings, keep bed in low position, maintain call bell within reach at all times, provide assistance with transfer out of bed and ambulation.

## 2019-06-25 NOTE — Progress Notes (Signed)
PROVIDER NOTE: Information contained herein reflects review and annotations entered in association with encounter. Interpretation of such information and data should be left to medically-trained personnel. Information provided to patient can be located elsewhere in the medical record under "Patient Instructions". Document created using STT-dictation technology, any transcriptional errors that may result from process are unintentional.    Patient: Tony Cardenas  Service Category: E/M  Provider: Gillis Santa, MD  DOB: [birth year]  DOS: 06/25/2019  Specialty: Interventional Pain Management  MRN: [id number]  Setting: Ambulatory outpatient  PCP: Valerie Roys, DO  Type: Established Patient    Referring Provider: Valerie Roys, DO  Location: Office  Delivery: Face-to-face     HPI  Reason for encounter: Tony Cardenas, a 49 y.o. year old male, is here today for evaluation and management of his No primary diagnosis found.. Tony Cardenas primary complain today is Medication Refill Last encounter: Practice (04/30/2019). My last encounter with him was on 04/30/2019. Pertinent problems: Tony Cardenas has S/P cervical spinal fusion; Cervical radicular pain; Cervical fusion syndrome; Chronic pain syndrome; and Cervical spondylosis with myelopathy and radiculopathy on their pertinent problem list. Pain Assessment: Severity of Chronic pain is reported as a 6 /10. Location: Neck Mid, Posterior/Denies. Onset: More than a month ago. Quality: Sharp, Constant ("healing pain"). Timing: Constant. Modifying factor(s): Hydrocodone, laying flat. Vitals:  height is _0  (1.778 m) and weight is 241 lb (109.3 kg). His temperature is 98 F (36.7 C). His blood pressure is 127/72 and his pulse is 79. His respiration is 18 and oxygen saturation is 97%.   Larkin Ina follows up today after his cervical spine surgery, C3-C6 posterior cervical fusion along with anterior cervical corpectomy. He states that his pain that was  going down his right arm has improved along with the weakness. He continues to have intermittent paresthesias of his right arm but those to have improved. He did utilize oxycodone prescribed by Dr. Cari Caraway for post surgical pain. He has transition back to his hydrocodone for his pain management which he states is effective. He has not started any physical therapy yet. He has effectively weaned down on his medication. He presents today for medication refill as below.   Pharmacotherapy Assessment   Analgesic: 06/03/2019  1   04/30/2019  Hydrocodone-Acetamin 5-325 MG  120.00  30 Bi Lat   [id number]   Arm (6112)   0  20.00 MME  Comm Ins   [place]     Monitoring: [place] PMP: PDMP reviewed during this encounter.       Pharmacotherapy: No side-effects or adverse reactions reported. Compliance: No problems identified. Effectiveness: Clinically acceptable.  UDS:  Summary  Date Value Ref Range Status  02/20/2019 Note  Final    Comment:    ==================================================================== Compliance Drug Analysis, Ur ==================================================================== Test                             Result       Flag       Units Drug Present and Declared for Prescription Verification   Hydrocodone                    761          EXPECTED   ng/mg creat   Hydromorphone                  480          EXPECTED  ng/mg creat   Dihydrocodeine                 100          EXPECTED   ng/mg creat   Norhydrocodone                 402          EXPECTED   ng/mg creat    Sources of hydrocodone include scheduled prescription medications.    Hydromorphone, dihydrocodeine and norhydrocodone are expected    metabolites of hydrocodone. Hydromorphone and dihydrocodeine are    also available as scheduled prescription medications.   Methocarbamol                  PRESENT      EXPECTED   Acetaminophen                  PRESENT      EXPECTED Drug Absent but Declared for Prescription  Verification   Pregabalin                     Not Detected UNEXPECTED ==================================================================== Test                      Result    Flag   Units      Ref Range   Creatinine              61               mg/dL      >=20 ==================================================================== Declared Medications:  The flagging and interpretation on this report are based on the  following declared medications.  Unexpected results may arise from  inaccuracies in the declared medications.  **Note: The testing scope of this panel includes these medications:  Hydrocodone (Norco)  Methocarbamol (Robaxin)  Pregabalin (Lyrica)  **Note: The testing scope of this panel does not include small to  moderate amounts of these reported medications:  Acetaminophen (Norco)  **Note: The testing scope of this panel does not include the  following reported medications:  Albuterol  Ezetimibe (Zetia)  Fluticasone (Flonase)  Folic Acid  Lisinopril (Zestril)  Metformin (Glucophage)  Methotrexate  Vitamin D2 (Drisdol) ==================================================================== For clinical consultation, please call [phone]. ====================================================================       ROS  Constitutional: Denies any fever or chills Gastrointestinal: No reported hemesis, hematochezia, vomiting, or acute GI distress Musculoskeletal: Denies any acute onset joint swelling, redness, loss of ROM, or weakness Neurological: No reported episodes of acute onset apraxia, aphasia, dysarthria, agnosia, amnesia, paralysis, loss of coordination, or loss of consciousness  Medication Review  HYDROcodone-acetaminophen, Vitamin D (Ergocalciferol), albuterol, blood glucose meter kit and supplies, ezetimibe, fluticasone, folic acid, lisinopril, metFORMIN, methocarbamol, methotrexate, methylPREDNISolone, and pregabalin  History Review  Allergy: Mr.  Cardenas is allergic to atorvastatin and hydromorphone hcl. Drug: Tony Cardenas  reports no history of drug use. Alcohol:  reports no history of alcohol use. Tobacco:  reports that he quit smoking about 3 months ago. His smoking use included cigarettes. He has a 10.00 pack-year smoking history. He quit smokeless tobacco use about 3 years ago.  His smokeless tobacco use included chew. Social: Tony Cardenas  reports that he quit smoking about 3 months ago. His smoking use included cigarettes. He has a 10.00 pack-year smoking history. He quit smokeless tobacco use about 3 years ago.  His smokeless tobacco use included chew. He reports that he does not drink alcohol and does  not use drugs. Medical:  has a past medical history of Arthritis, COPD (chronic obstructive pulmonary disease) ([place]), Diabetes mellitus without complication ([place]), GERD (gastroesophageal reflux disease), Hyperlipidemia, Hypertension, and Neck pain (1989). Surgical: Tony Cardenas  has a past surgical history that includes Neck surgery (1989); Knee surgery (Right); Colonoscopy (N/A, 06/02/2014); Polypectomy (06/02/2014); Appendectomy; Esophagogastroduodenoscopy (egd) with propofol (N/A, 07/29/2016); Back surgery; Anterior cervical corpectomy (N/A, 10/10/2018); Hernia repair (over 10 years ago); Anterior cervical corpectomy (N/A, 05/01/2019); and Posterior cervical fusion/foraminotomy (N/A, 05/01/2019). Family: family history includes Alcohol abuse in his father; COPD in his father; Cancer in his mother; Diabetes in his brother, father, and paternal grandmother.  Laboratory Chemistry Profile   Renal Lab Results  Component Value Date   BUN 11 06/13/2019   CREATININE 0.72 (L) 06/13/2019   BCR 15 06/13/2019   GFRAA 126 06/13/2019   GFRNONAA 109 06/13/2019     Hepatic Lab Results  Component Value Date   AST 20 06/13/2019   ALT 22 06/13/2019   ALBUMIN 4.5 06/13/2019   ALKPHOS 92 06/13/2019     Electrolytes Lab Results  Component Value  Date   NA 137 06/13/2019   K 4.3 06/13/2019   CL 98 06/13/2019   CALCIUM 9.4 06/13/2019     Bone Lab Results  Component Value Date   VD25OH CANCELED 06/13/2019     Inflammation (CRP: Acute Phase) (ESR: Chronic Phase) Lab Results  Component Value Date   CRP 2.9 (H) 03/15/2018   ESRSEDRATE 8 11/15/2017       Note: Above Lab results reviewed.  Recent Imaging Review  DG C-Arm 1-60 Min-No Report CLINICAL DATA:  Fluoroscopy provided for cervical spine fusion.  EXAM: CERVICAL SPINE - 2-3 VIEW; DG C-ARM 1-60 MIN; DG C-ARM 1-60 MIN-NO REPORT  COMPARISON:  None.  FINDINGS: Five portable images were submitted. Images show placement posterior fusion hardware from C3 to C6. The orthopedic hardware appears well positioned.  Previous interbody fusions spans C3-C5.  IMPRESSION: Operative imaging provided for posterior cervical spine fusion.  Electronically Signed   By: Lajean Manes M.D.   On: 05/01/2019 16:49 Note: Reviewed        Physical Exam  General appearance: Well nourished, well developed, and well hydrated. In no apparent acute distress Mental status: Alert, oriented x 3 (person, place, & time)       Respiratory: No evidence of acute respiratory distress Eyes: PERLA Vitals: BP 127/72   Pulse 79   Temp 98 F (36.7 C)   Resp 18   Ht _0  (1.778 m)   Wt 241 lb (109.3 kg)   SpO2 97%   BMI 34.58 kg/m  BMI: Estimated body mass index is 34.58 kg/m as calculated from the following:   Height as of this encounter: _1  (1.778 m).   Weight as of this encounter: 241 lb (109.3 kg). Ideal: Ideal body weight: 73 kg (160 lb 15 oz) Adjusted ideal body weight: 87.5 kg (192 lb 15.4 oz)  Cervical Spine Area Exam  Skin & Axial Inspection: Well healed scar from previous spine surgery detected Alignment: Symmetrical Functional ROM: Mechanically restricted ROM, bilaterally and pain restricted Stability: No instability detected Muscle Tone/Strength: Functionally intact.  No obvious neuro-muscular anomalies detected. Sensory (Neurological): Neuropathic pain pattern Palpation: No palpable anomalies               5 out of 5 strength bilateral upper extremity: Shoulder abduction, elbow flexion, elbow extension, thumb extension.  Upper Extremity (UE) Exam    Side:  Right upper extremity  Side: Left upper extremity  Skin & Extremity Inspection: Skin color, temperature, and hair growth are WNL. No peripheral edema or cyanosis. No masses, redness, swelling, asymmetry, or associated skin lesions. No contractures.  Skin & Extremity Inspection: Skin color, temperature, and hair growth are WNL. No peripheral edema or cyanosis. No masses, redness, swelling, asymmetry, or associated skin lesions. No contractures.  Functional ROM: Pain restricted ROM for shoulder  Functional ROM: Pain restricted ROM for shoulder  Muscle Tone/Strength: Functionally intact. No obvious neuro-muscular anomalies detected.  Muscle Tone/Strength: Functionally intact. No obvious neuro-muscular anomalies detected.  Sensory (Neurological): Unimpaired          Sensory (Neurological): Unimpaired          Palpation: No palpable anomalies              Palpation: No palpable anomalies              Provocative Test(s):  Phalen's test: deferred Tinel's test: deferred Apley's scratch test (touch opposite shoulder):  Action 1 (Across chest): deferred Action 2 (Overhead): deferred Action 3 (LB reach): deferred   Provocative Test(s):  Phalen's test: deferred Tinel's test: deferred Apley's scratch test (touch opposite shoulder):  Action 1 (Across chest): deferred Action 2 (Overhead): deferred Action 3 (LB reach): deferred     Assessment   Status Diagnosis  Controlled Controlled Controlled 1. Cervical fusion syndrome   2. Cervical radicular pain   3. Chronic pain syndrome      Updated Problems: Problem  Cervical Fusion Syndrome  Chronic Pain Syndrome  Cervical Spondylosis With Myelopathy and  Radiculopathy  S/P Cervical Spinal Fusion  Cervical Radicular Pain    Plan of Care  Tony Cardenas has a current medication list which includes the following long-term medication(s): albuterol, ezetimibe, fluticasone, lisinopril, metformin, and pregabalin.  Pharmacotherapy (Medications Ordered): Meds ordered this encounter  Medications  . HYDROcodone-acetaminophen (NORCO/VICODIN) 5-325 MG tablet    Sig: Take 1 tablet by mouth every 6 (six) hours as needed for severe pain. Must last 30 days.    Dispense:  120 tablet    Refill:  0    Chronic Pain. (STOP Act - Not applicable). Fill one day early if closed on scheduled refill date.  Marland Kitchen HYDROcodone-acetaminophen (NORCO/VICODIN) 5-325 MG tablet    Sig: Take 1 tablet by mouth every 6 (six) hours as needed for severe pain. Must last 30 days.    Dispense:  120 tablet    Refill:  0    Chronic Pain. (STOP Act - Not applicable). Fill one day early if closed on scheduled refill date.    Follow-up plan:   Return in about 8 weeks (around 08/20/2019) for Medication Management, in person.   Recent Visits Date Type Provider Dept  04/30/19 Office Visit Gillis Santa, MD [facility]  Showing recent visits within past 90 days and meeting all other requirements Today's Visits Date Type Provider Dept  06/25/19 Office Visit Gillis Santa, MD [facility]  Showing today's visits and meeting all other requirements Future Appointments Date Type Provider Dept  08/20/19 Appointment Gillis Santa, MD [facility]  Showing future appointments within next 90 days and meeting all other requirements  I discussed the assessment and treatment plan with the patient. The patient was provided an opportunity to ask questions and all were answered. The patient agreed with the plan and demonstrated an understanding of the instructions.  Patient advised to call back or seek an in-person evaluation if  the symptoms or condition  worsens.  Duration of encounter: 30 minutes.  Note by: Gillis Santa, MD Date: 06/25/2019; Time: 9:07 AM

## 2019-07-05 ENCOUNTER — Encounter: Payer: Self-pay | Admitting: Family Medicine

## 2019-08-01 ENCOUNTER — Encounter: Payer: Self-pay | Admitting: Family Medicine

## 2019-08-01 DIAGNOSIS — M960 Pseudarthrosis after fusion or arthrodesis: Secondary | ICD-10-CM | POA: Diagnosis not present

## 2019-08-01 DIAGNOSIS — Z9889 Other specified postprocedural states: Secondary | ICD-10-CM | POA: Diagnosis not present

## 2019-08-01 DIAGNOSIS — M4322 Fusion of spine, cervical region: Secondary | ICD-10-CM | POA: Diagnosis not present

## 2019-08-01 DIAGNOSIS — Z981 Arthrodesis status: Secondary | ICD-10-CM | POA: Diagnosis not present

## 2019-08-02 ENCOUNTER — Other Ambulatory Visit: Payer: Self-pay | Admitting: Family Medicine

## 2019-08-02 NOTE — Telephone Encounter (Signed)
Requested medication (s) are due for refill today: no  Requested medication (s) are on the active medication list: no  Last refill:  416/19, last ordered by historical provider  Future visit scheduled: yes  Notes to clinic:  Please review for refill. Refill not delegated per protocol    Requested Prescriptions  Pending Prescriptions Disp Refills   Vitamin D, Ergocalciferol, (DRISDOL) 1.25 MG (50000 UNIT) CAPS capsule [Pharmacy Med Name: VIT D2 1.25 MG (50,000 UNIT 1.25 MG Capsule] 12 capsule 0    Sig: TAKE 1 CAPSULE BY MOUTH ONCE A WEEK FOR 12 WEEKS      Endocrinology:  Vitamins - Vitamin D Supplementation Failed - 08/02/2019 11:59 AM      Failed - 50,000 IU strengths are not delegated      Failed - Phosphate in normal range and within 360 days    No results found for: PHOS        Passed - Ca in normal range and within 360 days    Calcium  Date Value Ref Range Status  06/13/2019 9.4 8.7 - 10.2 mg/dL Final   Calcium, Total  Date Value Ref Range Status  02/11/2013 8.1 (L) 8.5 - 10.1 mg/dL Final          Passed - Vitamin D in normal range and within 360 days    Vit D, 25-Hydroxy  Date Value Ref Range Status  06/13/2019 CANCELED ng/mL     Comment:    Test not performed. Specimen is grossly lipemic. Vitamin D deficiency has been defined by the [place] practice guideline as a level of serum 25-OH vitamin D less than 20 ng/mL (1,2). The Endocrine Society went on to further define vitamin D insufficiency as a level between 21 and 29 ng/mL (2). 1. IOM (Institute of Medicine). 2010. Dietary reference    intakes for calcium and D. [place]: The    [employer]. 2. Holick MF, Binkley [place], Bischoff-Ferrari HA, et al.    Evaluation, treatment, and prevention of vitamin D    deficiency: an Endocrine Society clinical practice    guideline. JCEM. 2011 Jul; 96(7):1911-30.  Result canceled by the ancillary.            Passed - Valid encounter within last 12 months    Recent Outpatient Visits           1 month ago Routine general medical examination at a health care facility   [facility], [place] P, DO   4 months ago Tobacco abuse   [place], [place], DO   4 months ago Adjustment disorder with anxious mood   [place], [place], DO   4 months ago Tobacco abuse   [employer], [place], DO   7 months ago Essential hypertension   [place], [place], DO       Future Appointments             In 1 month Johnson, Barb Merino, DO [facility], [facility]

## 2019-08-06 ENCOUNTER — Other Ambulatory Visit: Payer: 59

## 2019-08-06 ENCOUNTER — Other Ambulatory Visit: Payer: Self-pay

## 2019-08-06 DIAGNOSIS — Z1159 Encounter for screening for other viral diseases: Secondary | ICD-10-CM | POA: Diagnosis not present

## 2019-08-06 DIAGNOSIS — E559 Vitamin D deficiency, unspecified: Secondary | ICD-10-CM

## 2019-08-07 LAB — HEPATITIS C ANTIBODY: Hep C Virus Ab: <0.1 s/co ratio (ref 0.0–0.9)

## 2019-08-07 LAB — VITAMIN D 25 HYDROXY (VIT D DEFICIENCY, FRACTURES): Vit D, 25-Hydroxy: 22 ng/mL — ABNORMAL LOW (ref 30.0–100.0)

## 2019-08-08 ENCOUNTER — Other Ambulatory Visit: Payer: Self-pay | Admitting: Family Medicine

## 2019-08-08 MED ORDER — VITAMIN D (ERGOCALCIFEROL) 1.25 MG (50000 UNIT) PO CAPS: ORAL_CAPSULE | ORAL | 1 refills | Status: DC

## 2019-08-20 ENCOUNTER — Ambulatory Visit
Payer: 59 | Attending: Student in an Organized Health Care Education/Training Program | Admitting: Student in an Organized Health Care Education/Training Program

## 2019-08-20 ENCOUNTER — Other Ambulatory Visit: Payer: Self-pay

## 2019-08-20 ENCOUNTER — Encounter: Payer: Self-pay | Admitting: Student in an Organized Health Care Education/Training Program

## 2019-08-20 DIAGNOSIS — G894 Chronic pain syndrome: Secondary | ICD-10-CM | POA: Diagnosis not present

## 2019-08-20 DIAGNOSIS — Q761 Klippel-Feil syndrome: Secondary | ICD-10-CM | POA: Insufficient documentation

## 2019-08-20 DIAGNOSIS — M5412 Radiculopathy, cervical region: Secondary | ICD-10-CM | POA: Diagnosis not present

## 2019-08-20 MED ORDER — HYDROCODONE-ACETAMINOPHEN 5-325 MG PO TABS: 1.0000 | ORAL_TABLET | Freq: Four times a day (QID) | ORAL | 0 refills | Status: AC | PRN

## 2019-08-20 MED ORDER — HYDROCODONE-ACETAMINOPHEN 5-325 MG PO TABS: 1.0000 | ORAL_TABLET | Freq: Four times a day (QID) | ORAL | 0 refills | Status: DC | PRN

## 2019-08-20 NOTE — Progress Notes (Signed)
PROVIDER NOTE: Information contained herein reflects review and annotations entered in association with encounter. Interpretation of such information and data should be left to medically-trained personnel. Information provided to patient can be located elsewhere in the medical record under "Patient Instructions". Document created using STT-dictation technology, any transcriptional errors that may result from process are unintentional.    Patient: Tony Cardenas  Service Category: E/M  Provider: Gillis Santa, MD  DOB: 1970/10/26  DOS: 08/20/2019  Specialty: Interventional Pain Management  MRN: [id number]  Setting: Ambulatory outpatient  PCP: Valerie Roys, DO  Type: Established Patient    Referring Provider: Valerie Roys, DO  Location: Office  Delivery: Face-to-face     HPI  Reason for encounter: Tony Cardenas, a 49 y.o. year old male, is here today for evaluation and management of his No primary diagnosis found.. [name] primary complain today is Joint Pain (primarily hands ) and Neck Pain Last encounter: Practice (06/25/2019). My last encounter with him was on 06/25/2019. Pertinent problems: [name] has S/P cervical spinal fusion; Cervical radicular pain; Cervical fusion syndrome; Chronic pain syndrome; and Cervical spondylosis with myelopathy and radiculopathy on their pertinent problem list. Pain Assessment: Severity of Chronic pain is reported as a 9 /10. Location: Other (Comment) (generalized joint pain)  / . Onset: More than a month ago. Quality: Aching. Timing: Constant. Modifying factor(s): meds. Vitals:  height is _0  (1.778 m) and weight is 245 lb (111.1 kg). His oral temperature is 98.6 F (37 C). His blood pressure is 135/78 and his pulse is 93. His respiration is 16 and oxygen saturation is 97%.   Increased hand and neck pain, still recovering from surgery. Has difficulty performing tasks due to hand and neck pain. Denies numbness in hands Here for  refill below of hydrocodone.  Continue HC 6 hours as needed, quantity 120/month.  Also encourage patient to continue Lyrica and Robaxin.  Follow-up in 3 months.  Pharmacotherapy Assessment   08/02/2019  1   06/25/2019  Hydrocodone-Acetamin 5-325 MG  120.00  30 Bi Lat   [id number]   Arm (6112)   0/0  20.00 MME  Comm Ins   [place]     Monitoring: [place] PMP: PDMP reviewed during this encounter.       Pharmacotherapy: No side-effects or adverse reactions reported. Compliance: No problems identified. Effectiveness: Clinically acceptable.  Rise Patience, RN  08/20/2019  8:03 AM  Sign when Signing Visit Nursing Pain Medication Assessment:  Safety precautions to be maintained throughout the outpatient stay will include: orient to surroundings, keep bed in low position, maintain call bell within reach at all times, provide assistance with transfer out of bed and ambulation.  Medication Inspection Compliance: Pill count conducted under aseptic conditions, in front of the patient. Neither the pills nor the bottle was removed from the patient's sight at any time. Once count was completed pills were immediately returned to the patient in their original bottle.  Medication: Hydrocodone/APAP Pill/Patch Count: 51 of 120 pills remain Pill/Patch Appearance: Markings consistent with prescribed medication Bottle Appearance: Standard pharmacy container. Clearly labeled. Filled Date: 07 / 23 / 2021 Last Medication intake:  Yesterday    UDS:  Summary  Date Value Ref Range Status  02/20/2019 Note  Final    Comment:    ==================================================================== Compliance Drug Analysis, Ur ==================================================================== Test  Result       Flag       Units Drug Present and Declared for Prescription Verification   Hydrocodone                    761          EXPECTED   ng/mg creat   Hydromorphone                  480           EXPECTED   ng/mg creat   Dihydrocodeine                 100          EXPECTED   ng/mg creat   Norhydrocodone                 402          EXPECTED   ng/mg creat    Sources of hydrocodone include scheduled prescription medications.    Hydromorphone, dihydrocodeine and norhydrocodone are expected    metabolites of hydrocodone. Hydromorphone and dihydrocodeine are    also available as scheduled prescription medications.   Methocarbamol                  PRESENT      EXPECTED   Acetaminophen                  PRESENT      EXPECTED Drug Absent but Declared for Prescription Verification   Pregabalin                     Not Detected UNEXPECTED ==================================================================== Test                      Result    Flag   Units      Ref Range   Creatinine              61               mg/dL      >=20 ==================================================================== Declared Medications:  The flagging and interpretation on this report are based on the  following declared medications.  Unexpected results may arise from  inaccuracies in the declared medications.  **Note: The testing scope of this panel includes these medications:  Hydrocodone (Norco)  Methocarbamol (Robaxin)  Pregabalin (Lyrica)  **Note: The testing scope of this panel does not include small to  moderate amounts of these reported medications:  Acetaminophen (Norco)  **Note: The testing scope of this panel does not include the  following reported medications:  Albuterol  Ezetimibe (Zetia)  Fluticasone (Flonase)  Folic Acid  Lisinopril (Zestril)  Metformin (Glucophage)  Methotrexate  Vitamin D2 (Drisdol) ==================================================================== For clinical consultation, please call [phone]. ====================================================================      ROS  Constitutional: Denies any fever or chills Gastrointestinal: No reported hemesis,  hematochezia, vomiting, or acute GI distress Musculoskeletal: Neck pain, bilateral hand pain Neurological: No reported episodes of acute onset apraxia, aphasia, dysarthria, agnosia, amnesia, paralysis, loss of coordination, or loss of consciousness  Medication Review  HYDROcodone-acetaminophen, Vitamin D (Ergocalciferol), albuterol, blood glucose meter kit and supplies, ezetimibe, fluticasone, folic acid, lisinopril, metFORMIN, methocarbamol, methotrexate, methylPREDNISolone, and pregabalin  History Review  Allergy: Mr. Swire is allergic to atorvastatin and hydromorphone hcl. Drug: Mr. Eisenberg  reports no history of drug use. Alcohol:  reports no history of alcohol use. Tobacco:  reports that he quit  smoking about 5 months ago. His smoking use included cigarettes. He has a 10.00 pack-year smoking history. He quit smokeless tobacco use about 3 years ago.  His smokeless tobacco use included chew. Social: Mr. Hritz  reports that he quit smoking about 5 months ago. His smoking use included cigarettes. He has a 10.00 pack-year smoking history. He quit smokeless tobacco use about 3 years ago.  His smokeless tobacco use included chew. He reports that he does not drink alcohol and does not use drugs. Medical:  has a past medical history of Arthritis, COPD (chronic obstructive pulmonary disease) ([place]), Diabetes mellitus without complication ([place]), GERD (gastroesophageal reflux disease), Hyperlipidemia, Hypertension, and Neck pain (1989). Surgical: Mr. Bishop  has a past surgical history that includes Neck surgery (1989); Knee surgery (Right); Colonoscopy (N/A, 06/02/2014); Polypectomy (06/02/2014); Appendectomy; Esophagogastroduodenoscopy (egd) with propofol (N/A, 07/29/2016); Back surgery; Anterior cervical corpectomy (N/A, 10/10/2018); Hernia repair (over 10 years ago); Anterior cervical corpectomy (N/A, 05/01/2019); and Posterior cervical fusion/foraminotomy (N/A, 05/01/2019). Family: family history  includes Alcohol abuse in his father; COPD in his father; Cancer in his mother; Diabetes in his brother, father, and paternal grandmother.  Laboratory Chemistry Profile   Renal Lab Results  Component Value Date   BUN 11 06/13/2019   CREATININE 0.72 (L) 06/13/2019   BCR 15 06/13/2019   GFRAA 126 06/13/2019   GFRNONAA 109 06/13/2019     Hepatic Lab Results  Component Value Date   AST 20 06/13/2019   ALT 22 06/13/2019   ALBUMIN 4.5 06/13/2019   ALKPHOS 92 06/13/2019     Electrolytes Lab Results  Component Value Date   NA 137 06/13/2019   K 4.3 06/13/2019   CL 98 06/13/2019   CALCIUM 9.4 06/13/2019     Bone Lab Results  Component Value Date   VD25OH 22.0 (L) 08/06/2019     Inflammation (CRP: Acute Phase) (ESR: Chronic Phase) Lab Results  Component Value Date   CRP 2.9 (H) 03/15/2018   ESRSEDRATE 8 11/15/2017       Note: Above Lab results reviewed.  Recent Imaging Review  DG C-Arm 1-60 Min-No Report CLINICAL DATA:  Fluoroscopy provided for cervical spine fusion.  EXAM: CERVICAL SPINE - 2-3 VIEW; DG C-ARM 1-60 MIN; DG C-ARM 1-60 MIN-NO REPORT  COMPARISON:  None.  FINDINGS: Five portable images were submitted. Images show placement posterior fusion hardware from C3 to C6. The orthopedic hardware appears well positioned.  Previous interbody fusions spans C3-C5.  IMPRESSION: Operative imaging provided for posterior cervical spine fusion.  Electronically Signed   By: Lajean Manes M.D.   On: 05/01/2019 16:49 Note: Reviewed        Physical Exam  General appearance: Well nourished, well developed, and well hydrated. In no apparent acute distress Mental status: Alert, oriented x 3 (person, place, & time)       Respiratory: No evidence of acute respiratory distress Eyes: PERLA Vitals: BP 135/78   Pulse 93   Temp 98.6 F (37 C) (Oral)   Resp 16   Ht _0  (1.778 m)   Wt 245 lb (111.1 kg)   SpO2 97%   BMI 35.15 kg/m  BMI: Estimated body mass index  is 35.15 kg/m as calculated from the following:   Height as of this encounter: _1  (1.778 m).   Weight as of this encounter: 245 lb (111.1 kg). Ideal: Ideal body weight: 73 kg (160 lb 15 oz) Adjusted ideal body weight: 88.3 kg (194 lb 9 oz)  Cervical Spine Area  Exam  Skin & Axial Inspection: Well healed scar from previous spine surgery detected Alignment: Symmetrical Functional ROM: Mechanically restricted ROM, bilaterally and pain restricted Stability: No instability detected Muscle Tone/Strength: Functionally intact. No obvious neuro-muscular anomalies detected. Sensory (Neurological): Neuropathic pain pattern Palpation: No palpable anomalies               5 out of 5 strength bilateral upper extremity: Shoulder abduction, elbow flexion, elbow extension, thumb extension.  Upper Extremity (UE) Exam    Side: Right upper extremity  Side: Left upper extremity  Skin & Extremity Inspection: Skin color, temperature, and hair growth are WNL. No peripheral edema or cyanosis. No masses, redness, swelling, asymmetry, or associated skin lesions. No contractures.  Skin & Extremity Inspection: Skin color, temperature, and hair growth are WNL. No peripheral edema or cyanosis. No masses, redness, swelling, asymmetry, or associated skin lesions. No contractures.  Functional ROM: Pain restricted ROM for shoulder  Functional ROM: Pain restricted ROM for shoulder  Muscle Tone/Strength: Functionally intact. No obvious neuro-muscular anomalies detected.  Muscle Tone/Strength: Functionally intact. No obvious neuro-muscular anomalies detected.  Sensory (Neurological): Unimpaired          Sensory (Neurological): Unimpaired          Palpation: No palpable anomalies              Palpation: No palpable anomalies              Provocative Test(s):  Phalen's test: deferred Tinel's test: deferred Apley's scratch test (touch opposite shoulder):  Action 1 (Across chest): deferred Action 2 (Overhead):  deferred Action 3 (LB reach): deferred   Provocative Test(s):  Phalen's test: deferred Tinel's test: deferred Apley's scratch test (touch opposite shoulder):  Action 1 (Across chest): deferred Action 2 (Overhead): deferred Action 3 (LB reach): deferred     Assessment & Plan  Primary Diagnosis & Pertinent Problem List: Diagnoses of Cervical fusion syndrome, Cervical radicular pain, and Chronic pain syndrome were pertinent to this visit.  Visit Diagnosis: 1. Cervical fusion syndrome   2. Cervical radicular pain   3. Chronic pain syndrome    Follows up for medication management.  Is still recovering from his cervical spine surgery: Corpectomy and posterior spinal fusion.  Patient feels that he still needs to continue his hydrocodone every 4-6 hours as needed given his postoperative pain.  This is reasonable.  We will refill as below and encourage patient to continue Lyrica and Robaxin as prescribed.  No refills needed.  Plan of Care  Pharmacotherapy (Medications Ordered): Meds ordered this encounter  Medications  . HYDROcodone-acetaminophen (NORCO/VICODIN) 5-325 MG tablet    Sig: Take 1 tablet by mouth every 6 (six) hours as needed for severe pain. Must last 30 days.    Dispense:  120 tablet    Refill:  0    Chronic Pain. (STOP Act - Not applicable). Fill one day early if closed on scheduled refill date.  Marland Kitchen HYDROcodone-acetaminophen (NORCO/VICODIN) 5-325 MG tablet    Sig: Take 1 tablet by mouth every 6 (six) hours as needed for severe pain. Must last 30 days.    Dispense:  120 tablet    Refill:  0    Chronic Pain. (STOP Act - Not applicable). Fill one day early if closed on scheduled refill date.  Marland Kitchen HYDROcodone-acetaminophen (NORCO/VICODIN) 5-325 MG tablet    Sig: Take 1 tablet by mouth every 6 (six) hours as needed for severe pain. Must last 30 days.    Dispense:  120 tablet  Refill:  0    Chronic Pain. (STOP Act - Not applicable). Fill one day early if closed on scheduled  refill date.   Follow-up plan:   Return in about 3 months (around 11/20/2019) for Medication Management, in person.   Recent Visits Date Type Provider Dept  06/25/19 Office Visit Gillis Santa, MD [facility]  Showing recent visits within past 90 days and meeting all other requirements Today's Visits Date Type Provider Dept  08/20/19 Office Visit Gillis Santa, MD [facility]  Showing today's visits and meeting all other requirements Future Appointments No visits were found meeting these conditions. Showing future appointments within next 90 days and meeting all other requirements  I discussed the assessment and treatment plan with the patient. The patient was provided an opportunity to ask questions and all were answered. The patient agreed with the plan and demonstrated an understanding of the instructions.  Patient advised to call back or seek an in-person evaluation if the symptoms or condition worsens.  Duration of encounter: 30 minutes.  Note by: Gillis Santa, MD Date: 08/20/2019; Time: 8:28 AM

## 2019-08-20 NOTE — Patient Instructions (Signed)
Rx hydrocodone/APAP to last until 11/30/2019  escribed to pharmacy.

## 2019-08-20 NOTE — Progress Notes (Signed)
Nursing Pain Medication Assessment:  Safety precautions to be maintained throughout the outpatient stay will include: orient to surroundings, keep bed in low position, maintain call bell within reach at all times, provide assistance with transfer out of bed and ambulation.  Medication Inspection Compliance: Pill count conducted under aseptic conditions, in front of the patient. Neither the pills nor the bottle was removed from the patient's sight at any time. Once count was completed pills were immediately returned to the patient in their original bottle.  Medication: Hydrocodone/APAP Pill/Patch Count: 51 of 120 pills remain Pill/Patch Appearance: Markings consistent with prescribed medication Bottle Appearance: Standard pharmacy container. Clearly labeled. Filled Date: 07 / 23 / 2021 Last Medication intake:  Yesterday

## 2019-09-10 ENCOUNTER — Other Ambulatory Visit: Payer: Self-pay | Admitting: Student

## 2019-09-13 ENCOUNTER — Other Ambulatory Visit: Payer: Self-pay | Admitting: Family Medicine

## 2019-09-13 ENCOUNTER — Other Ambulatory Visit: Payer: Self-pay

## 2019-09-13 ENCOUNTER — Ambulatory Visit (INDEPENDENT_AMBULATORY_CARE_PROVIDER_SITE_OTHER): Payer: 59 | Admitting: Family Medicine

## 2019-09-13 ENCOUNTER — Encounter: Payer: Self-pay | Admitting: Family Medicine

## 2019-09-13 VITALS — BP 133/82 | HR 77 | Temp 98.7°F | Wt 248.0 lb

## 2019-09-13 DIAGNOSIS — E1165 Type 2 diabetes mellitus with hyperglycemia: Secondary | ICD-10-CM

## 2019-09-13 DIAGNOSIS — Z23 Encounter for immunization: Secondary | ICD-10-CM | POA: Diagnosis not present

## 2019-09-13 LAB — BAYER DCA HB A1C WAIVED: HB A1C (BAYER DCA - WAIVED): 7.8 % — ABNORMAL HIGH (ref <7.0)

## 2019-09-13 MED ORDER — EMPAGLIFLOZIN 25 MG PO TABS
25.0000 mg | ORAL_TABLET | Freq: Every day | ORAL | 3 refills | Status: DC
Start: 2019-09-13 — End: 2019-12-16

## 2019-09-13 NOTE — Progress Notes (Signed)
BP 133/82 (BP Location: Right Arm, Patient Position: Sitting, Cuff Size: Normal)   Pulse 77   Temp 98.7 F (37.1 C) (Oral)   Wt 248 lb (112.5 kg)   SpO2 94%   BMI 35.58 kg/m    Subjective:    Patient ID: Tony Cardenas, male    DOB: 14-Jul-1970, 49 y.o.   MRN: [id number]  HPI: Tony Cardenas is a 49 y.o. male  Chief Complaint  Patient presents with  . Diabetes   DIABETES Hypoglycemic episodes:no Polydipsia/polyuria: no Visual disturbance: no Chest pain: no Paresthesias: no Glucose Monitoring: yes Taking Insulin?: no Blood Pressure Monitoring: not checking Retinal Examination: Up to Date Foot Exam: Up to Date Diabetic Education: Completed Pneumovax: Up to Date Influenza: Up to Date Aspirin: yes   Relevant past medical, surgical, family and social history reviewed and updated as indicated. Interim medical history since our last visit reviewed. Allergies and medications reviewed and updated.  Review of Systems  Constitutional: Negative.   Respiratory: Negative.   Cardiovascular: Negative.   Gastrointestinal: Negative.   Musculoskeletal: Positive for myalgias, neck pain and neck stiffness. Negative for arthralgias, back pain, gait problem and joint swelling.  Neurological: Negative.   Psychiatric/Behavioral: Negative.     Per HPI unless specifically indicated above     Objective:    BP 133/82 (BP Location: Right Arm, Patient Position: Sitting, Cuff Size: Normal)   Pulse 77   Temp 98.7 F (37.1 C) (Oral)   Wt 248 lb (112.5 kg)   SpO2 94%   BMI 35.58 kg/m   Wt Readings from Last 3 Encounters:  09/13/19 248 lb (112.5 kg)  08/20/19 245 lb (111.1 kg)  06/25/19 241 lb (109.3 kg)    Physical Exam Vitals and nursing note reviewed.  Constitutional:      General: He is not in acute distress.    Appearance: Normal appearance. He is not ill-appearing, toxic-appearing or diaphoretic.  HENT:     Head: Normocephalic and atraumatic.     Right Ear:  External ear normal.     Left Ear: External ear normal.     Nose: Nose normal.     Mouth/Throat:     Mouth: Mucous membranes are moist.     Pharynx: Oropharynx is clear.  Eyes:     General: No scleral icterus.       Right eye: No discharge.        Left eye: No discharge.     Extraocular Movements: Extraocular movements intact.     Conjunctiva/sclera: Conjunctivae normal.     Pupils: Pupils are equal, round, and reactive to light.  Cardiovascular:     Rate and Rhythm: Normal rate and regular rhythm.     Pulses: Normal pulses.     Heart sounds: Normal heart sounds. No murmur heard.  No friction rub. No gallop.   Pulmonary:     Effort: Pulmonary effort is normal. No respiratory distress.     Breath sounds: Normal breath sounds. No stridor. No wheezing, rhonchi or rales.  Chest:     Chest wall: No tenderness.  Musculoskeletal:        General: Normal range of motion.     Cervical back: Normal range of motion and neck supple.  Skin:    General: Skin is warm and dry.     Capillary Refill: Capillary refill takes less than 2 seconds.     Coloration: Skin is not jaundiced or pale.     Findings: No bruising, erythema, lesion  or rash.  Neurological:     General: No focal deficit present.     Mental Status: He is alert and oriented to person, place, and time. Mental status is at baseline.  Psychiatric:        Mood and Affect: Mood normal.        Behavior: Behavior normal.        Thought Content: Thought content normal.        Judgment: Judgment normal.     Results for orders placed or performed in visit on 08/06/19  VITAMIN D 25 Hydroxy (Vit-D Deficiency, Fractures)  Result Value Ref Range   Vit D, 25-Hydroxy 22.0 (L) 30.0 - 100.0 ng/mL  Hepatitis C antibody  Result Value Ref Range   Hep C Virus Ab <0.1 0.0 - 0.9 s/co ratio      Assessment & Plan:   Problem List Items Addressed This Visit      Endocrine   Type 2 diabetes mellitus with hyperglycemia ([place]) - Primary     Running high with A1c of 7.8. Will add jardiance and continue metformin. Continue to monitor. Call with any concerns.       Relevant Medications   empagliflozin (JARDIANCE) 25 MG TABS tablet   Other Relevant Orders   Bayer DCA Hb A1c Waived    Other Visit Diagnoses    Need for influenza vaccination       Flu shot given today.   Relevant Orders   Flu Vaccine QUAD 6+ mos PF IM (Fluarix Quad PF) (Completed)       Follow up plan: Return in about 3 months (around 12/13/2019).

## 2019-09-13 NOTE — Assessment & Plan Note (Signed)
Running high with A1c of 7.8. Will add jardiance and continue metformin. Continue to monitor. Call with any concerns.

## 2019-10-07 ENCOUNTER — Other Ambulatory Visit: Payer: Self-pay | Admitting: Family Medicine

## 2019-10-07 NOTE — Telephone Encounter (Signed)
Requested medication (s) are due for refill today- yes  Requested medication (s) are on the active medication list -yes  Future visit scheduled -yes  Last refill: 08/02/19  Notes to clinic: Request for RF non delegated Rx  Requested Prescriptions  Pending Prescriptions Disp Refills   methocarbamol (ROBAXIN) 500 MG tablet [Pharmacy Med Name: METHOCARBAMOL 500 MG TABS 500 Tablet] 180 tablet 1    Sig: TAKE 1 TABLET BY MOUTH 4 TIMES DAILY.      Not Delegated - Analgesics:  Muscle Relaxants Failed - 10/07/2019  7:43 AM      Failed - This refill cannot be delegated      Passed - Valid encounter within last 6 months    Recent Outpatient Visits           3 weeks ago Type 2 diabetes mellitus with hyperglycemia, without long-term current use of insulin ([place])   [place], Megan P, DO   3 months ago Routine general medical examination at a health care facility   [facility], [place] P, DO   6 months ago Tobacco abuse   [place], [place], DO   6 months ago Adjustment disorder with anxious mood   [place], [place], DO   6 months ago Tobacco abuse   [employer], [place], DO       Future Appointments             In 2 months [place], Megan P, DO [employer]                Requested Prescriptions  Pending Prescriptions Disp Refills   methocarbamol (ROBAXIN) 500 MG tablet [Pharmacy Med Name: METHOCARBAMOL 500 MG TABS 500 Tablet] 180 tablet 1    Sig: TAKE 1 TABLET BY MOUTH 4 TIMES DAILY.      Not Delegated - Analgesics:  Muscle Relaxants Failed - 10/07/2019  7:43 AM      Failed - This refill cannot be delegated      Passed - Valid encounter within last 6 months    Recent Outpatient Visits           3 weeks ago Type 2 diabetes mellitus with hyperglycemia, without long-term current use of insulin ([place])   [place], Megan P, DO    3 months ago Routine general medical examination at a health care facility   [facility], [place] P, DO   6 months ago Tobacco abuse   [place], [place], DO   6 months ago Adjustment disorder with anxious mood   [place], DO   6 months ago Tobacco abuse   [employer], [place], DO       Future Appointments             In 2 months Wynetta Emery, Megan P, DO [employer]

## 2019-10-07 NOTE — Telephone Encounter (Signed)
Patient last seen 09/13/19 and has appointment 12/16/19.

## 2019-10-29 DIAGNOSIS — H5213 Myopia, bilateral: Secondary | ICD-10-CM | POA: Diagnosis not present

## 2019-10-29 LAB — HM DIABETES EYE EXAM

## 2019-11-18 DIAGNOSIS — B351 Tinea unguium: Secondary | ICD-10-CM | POA: Diagnosis not present

## 2019-11-18 DIAGNOSIS — M79675 Pain in left toe(s): Secondary | ICD-10-CM | POA: Diagnosis not present

## 2019-11-18 DIAGNOSIS — L601 Onycholysis: Secondary | ICD-10-CM | POA: Diagnosis not present

## 2019-11-18 DIAGNOSIS — E1142 Type 2 diabetes mellitus with diabetic polyneuropathy: Secondary | ICD-10-CM | POA: Diagnosis not present

## 2019-11-18 DIAGNOSIS — M79674 Pain in right toe(s): Secondary | ICD-10-CM | POA: Diagnosis not present

## 2019-11-19 ENCOUNTER — Encounter: Payer: Self-pay | Admitting: Student in an Organized Health Care Education/Training Program

## 2019-11-19 ENCOUNTER — Other Ambulatory Visit: Payer: Self-pay

## 2019-11-19 ENCOUNTER — Other Ambulatory Visit: Payer: Self-pay | Admitting: Student in an Organized Health Care Education/Training Program

## 2019-11-19 ENCOUNTER — Ambulatory Visit
Payer: 59 | Attending: Student in an Organized Health Care Education/Training Program | Admitting: Student in an Organized Health Care Education/Training Program

## 2019-11-19 VITALS — BP 151/88 | HR 98 | Temp 97.9°F | Resp 18 | Ht 70.0 in | Wt 235.0 lb

## 2019-11-19 DIAGNOSIS — G894 Chronic pain syndrome: Secondary | ICD-10-CM | POA: Diagnosis not present

## 2019-11-19 DIAGNOSIS — Z981 Arthrodesis status: Secondary | ICD-10-CM | POA: Insufficient documentation

## 2019-11-19 DIAGNOSIS — Q761 Klippel-Feil syndrome: Secondary | ICD-10-CM | POA: Diagnosis not present

## 2019-11-19 DIAGNOSIS — M5412 Radiculopathy, cervical region: Secondary | ICD-10-CM | POA: Insufficient documentation

## 2019-11-19 MED ORDER — HYDROCODONE-ACETAMINOPHEN 7.5-325 MG PO TABS: 1.0000 | ORAL_TABLET | Freq: Four times a day (QID) | ORAL | 0 refills | Status: DC | PRN

## 2019-11-19 MED ORDER — PREGABALIN 50 MG PO CAPS: 50.0000 mg | ORAL_CAPSULE | Freq: Every day | ORAL | 2 refills | Status: DC

## 2019-11-19 MED ORDER — HYDROCODONE-ACETAMINOPHEN 7.5-325 MG PO TABS: 1.0000 | ORAL_TABLET | Freq: Four times a day (QID) | ORAL | 0 refills | Status: AC | PRN

## 2019-11-19 NOTE — Progress Notes (Signed)
Nursing Pain Medication Assessment:  Safety precautions to be maintained throughout the outpatient stay will include: orient to surroundings, keep bed in low position, maintain call bell within reach at all times, provide assistance with transfer out of bed and ambulation.  Medication Inspection Compliance: Pill count conducted under aseptic conditions, in front of the patient. Neither the pills nor the bottle was removed from the patient's sight at any time. Once count was completed pills were immediately returned to the patient in their original bottle.  Medication: Hydrocodone/APAP Pill/Patch Count: 48 of 120 pills remain Pill/Patch Appearance: Markings consistent with prescribed medication Bottle Appearance: Standard pharmacy container. Clearly labeled. Filled Date:10 /21 / 2021 Last Medication intake:  Tony Cardenas

## 2019-11-19 NOTE — Progress Notes (Signed)
PROVIDER NOTE: Information contained herein reflects review and annotations entered in association with encounter. Interpretation of such information and data should be left to medically-trained personnel. Information provided to patient can be located elsewhere in the medical record under "Patient Instructions". Document created using STT-dictation technology, any transcriptional errors that may result from process are unintentional.    Patient: Tony Cardenas  Service Category: E/M  Provider: Gillis Santa, MD  DOB: 1970-11-29  DOS: 11/19/2019  Specialty: Interventional Pain Management  MRN: [id number]  Setting: Ambulatory outpatient  PCP: Valerie Roys, DO  Type: Established Patient    Referring Provider: Valerie Roys, DO  Location: Office  Delivery: Face-to-face     HPI  Tony Cardenas, a 49 y.o. 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 Tony Cardenas primary complain today is Neck Pain Last encounter: My last encounter with him was on 08/20/2019. Pertinent problems: Tony Cardenas has S/P cervical spinal fusion; Cervical radicular pain; Cervical fusion syndrome; Chronic pain syndrome; and Cervical spondylosis with myelopathy and radiculopathy on their pertinent problem list. Pain Assessment: Severity of Chronic pain is reported as a 9 /10. Location: Neck  /radiates down both shoulders. Onset: More than a month ago. Quality: Dull, Constant. Timing: Constant. Modifying factor(s): lying down. Vitals:  height is 5' 10"  (1.778 m) and weight is 235 lb (106.6 kg). His temperature is 97.9 F (36.6 C). His blood pressure is 151/88 (abnormal) and his pulse is 98. His respiration is 18 and oxygen saturation is 97%.   Reason for encounter: medication management.   No change in medical history since last visit. Has been having increased neck pain but feels that is related to weather change. Is not waking up at night any more (as he was doing prior to  surgery). Patient continues multimodal pain regimen as prescribed.  States that it provides pain relief and improvement in functional status.   Pharmacotherapy Assessment   10/31/2019  08/20/2019   1  Hydrocodone-Acetamin 5-325 Mg 120.00  30  Bi Lat  [id number]  Arm (6112)  0/0  20.00 MME  Comm Ins  [place]      Analgesic: Hydrocodone 5 mg QID PRN will increase to 7.5 mg QID PRN for the winter    Monitoring: [place] PMP: PDMP reviewed during this encounter.       Pharmacotherapy: No side-effects or adverse reactions reported. Compliance: No problems identified. Effectiveness: Clinically acceptable.  Dewayne Shorter, RN  11/19/2019  8:13 AM  Signed Nursing Pain Medication Assessment:  Safety precautions to be maintained throughout the outpatient stay will include: orient to surroundings, keep bed in low position, maintain call bell within reach at all times, provide assistance with transfer out of bed and ambulation.  Medication Inspection Compliance: Pill count conducted under aseptic conditions, in front of the patient. Neither the pills nor the bottle was removed from the patient's sight at any time. Once count was completed pills were immediately returned to the patient in their original bottle.  Medication: Hydrocodone/APAP Pill/Patch Count: 48 of 120 pills remain Pill/Patch Appearance: Markings consistent with prescribed medication Bottle Appearance: Standard pharmacy container. Clearly labeled. Filled Date:10 /21 / 2021 Last Medication intake:  Yesterday    UDS:  Summary  Date Value Ref Range Status  02/20/2019 Note  Final    Comment:    ==================================================================== Compliance Drug Analysis, Ur ==================================================================== Test  Result       Flag       Units Drug Present and Declared for Prescription Verification   Hydrocodone                    761          EXPECTED   ng/mg creat    Hydromorphone                  480          EXPECTED   ng/mg creat   Dihydrocodeine                 100          EXPECTED   ng/mg creat   Norhydrocodone                 402          EXPECTED   ng/mg creat    Sources of hydrocodone include scheduled prescription medications.    Hydromorphone, dihydrocodeine and norhydrocodone are expected    metabolites of hydrocodone. Hydromorphone and dihydrocodeine are    also available as scheduled prescription medications.   Methocarbamol                  PRESENT      EXPECTED   Acetaminophen                  PRESENT      EXPECTED Drug Absent but Declared for Prescription Verification   Pregabalin                     Not Detected UNEXPECTED ==================================================================== Test                      Result    Flag   Units      Ref Range   Creatinine              61               mg/dL      >=20 ==================================================================== Declared Medications:  The flagging and interpretation on this report are based on the  following declared medications.  Unexpected results may arise from  inaccuracies in the declared medications.  **Note: The testing scope of this panel includes these medications:  Hydrocodone (Norco)  Methocarbamol (Robaxin)  Pregabalin (Lyrica)  **Note: The testing scope of this panel does not include small to  moderate amounts of these reported medications:  Acetaminophen (Norco)  **Note: The testing scope of this panel does not include the  following reported medications:  Albuterol  Ezetimibe (Zetia)  Fluticasone (Flonase)  Folic Acid  Lisinopril (Zestril)  Metformin (Glucophage)  Methotrexate  Vitamin D2 (Drisdol) ==================================================================== For clinical consultation, please call [phone]. ====================================================================      ROS  Constitutional: Denies any fever or  chills Gastrointestinal: No reported hemesis, hematochezia, vomiting, or acute GI distress Musculoskeletal: Denies any acute onset joint swelling, redness, loss of ROM, or weakness Neurological: No reported episodes of acute onset apraxia, aphasia, dysarthria, agnosia, amnesia, paralysis, loss of coordination, or loss of consciousness  Medication Review  HYDROcodone-acetaminophen, Sodium Fluoride, Vitamin D (Ergocalciferol), albuterol, blood glucose meter kit and supplies, empagliflozin, ezetimibe, fluticasone, folic acid, lisinopril, metFORMIN, methocarbamol, methotrexate, and pregabalin  History Review  Allergy: Tony Cardenas is allergic to atorvastatin and hydromorphone hcl. Drug: Tony Cardenas  reports no history of drug use. Alcohol:  reports no history  of alcohol use. Tobacco:  reports that he quit smoking about 8 months ago. His smoking use included cigarettes. He has a 10.00 pack-year smoking history. He quit smokeless tobacco use about 3 years ago.  His smokeless tobacco use included chew. Social: Tony Cardenas  reports that he quit smoking about 8 months ago. His smoking use included cigarettes. He has a 10.00 pack-year smoking history. He quit smokeless tobacco use about 3 years ago.  His smokeless tobacco use included chew. He reports that he does not drink alcohol and does not use drugs. Medical:  has a past medical history of Arthritis, COPD (chronic obstructive pulmonary disease) ([place]), Diabetes mellitus without complication ([place]), GERD (gastroesophageal reflux disease), Hyperlipidemia, Hypertension, and Neck pain (1989). Surgical: Mr. Debois  has a past surgical history that includes Neck surgery (1989); Knee surgery (Right); Colonoscopy (N/A, 06/02/2014); Polypectomy (06/02/2014); Appendectomy; Esophagogastroduodenoscopy (egd) with propofol (N/A, 07/29/2016); Back surgery; Anterior cervical corpectomy (N/A, 10/10/2018); Hernia repair (over 10 years ago); Anterior cervical corpectomy (N/A,  05/01/2019); and Posterior cervical fusion/foraminotomy (N/A, 05/01/2019). Family: family history includes Alcohol abuse in his father; COPD in his father; Cancer in his mother; Diabetes in his brother, father, and paternal grandmother.  Laboratory Chemistry Profile   Renal Lab Results  Component Value Date   BUN 11 06/13/2019   CREATININE 0.72 (L) 06/13/2019   BCR 15 06/13/2019   GFRAA 126 06/13/2019   GFRNONAA 109 06/13/2019     Hepatic Lab Results  Component Value Date   AST 20 06/13/2019   ALT 22 06/13/2019   ALBUMIN 4.5 06/13/2019   ALKPHOS 92 06/13/2019     Electrolytes Lab Results  Component Value Date   NA 137 06/13/2019   K 4.3 06/13/2019   CL 98 06/13/2019   CALCIUM 9.4 06/13/2019     Bone Lab Results  Component Value Date   VD25OH 22.0 (L) 08/06/2019     Inflammation (CRP: Acute Phase) (ESR: Chronic Phase) Lab Results  Component Value Date   CRP 2.9 (H) 03/15/2018   ESRSEDRATE 8 11/15/2017       Note: Above Lab results reviewed.  Recent Imaging Review  DG C-Arm 1-60 Min-No Report CLINICAL DATA:  Fluoroscopy provided for cervical spine fusion.  EXAM: CERVICAL SPINE - 2-3 VIEW; DG C-ARM 1-60 MIN; DG C-ARM 1-60 MIN-NO REPORT  COMPARISON:  None.  FINDINGS: Five portable images were submitted. Images show placement posterior fusion hardware from C3 to C6. The orthopedic hardware appears well positioned.  Previous interbody fusions spans C3-C5.  IMPRESSION: Operative imaging provided for posterior cervical spine fusion.  Electronically Signed   By: Lajean Manes M.D.   On: 05/01/2019 16:49 Note: Reviewed        Physical Exam  General appearance: Well nourished, well developed, and well hydrated. In no apparent acute distress Mental status: Alert, oriented x 3 (person, place, & time)       Respiratory: No evidence of acute respiratory distress Eyes: PERLA Vitals: BP (!) 151/88   Pulse 98   Temp 97.9 F (36.6 C)   Resp 18   Ht 5' 10"   (1.778 m)   Wt 235 lb (106.6 kg)   SpO2 97%   BMI 33.72 kg/m  BMI: Estimated body mass index is 33.72 kg/m as calculated from the following:   Height as of this encounter: 5' 10"  (1.778 m).   Weight as of this encounter: 235 lb (106.6 kg). Ideal: Ideal body weight: 73 kg (160 lb 15 oz) Adjusted ideal body weight: 86.4  kg (190 lb 9 oz)  Cervical Spine Area Exam  Skin & Axial Inspection:Well healed scar from previous spine surgery detected Alignment:Symmetrical Functional PHX:[id number] restricted ROM, bilaterallyand pain restricted Stability:No instability detected Muscle Tone/Strength:Functionally intact. No obvious neuro-muscular anomalies detected. Sensory (Neurological):Neuropathic pain pattern Palpation:No palpable anomalies  5 out of 5 strength bilateral upper extremity: Shoulder abduction, elbow flexion, elbow extension, thumb extension.  Upper Extremity (UE) Exam    Side:Right upper extremity  Side:Left upper extremity  Skin & Extremity Inspection:Skin color, temperature, and hair growth are WNL. No peripheral edema or cyanosis. No masses, redness, swelling, asymmetry, or associated skin lesions. No contractures.  Skin & Extremity Inspection:Skin color, temperature, and hair growth are WNL. No peripheral edema or cyanosis. No masses, redness, swelling, asymmetry, or associated skin lesions. No contractures.  Functional PVV:ZSMO restricted ROMfor shoulder  Functional LMB:EMLJ restricted ROMfor shoulder  Muscle Tone/Strength:Functionally intact. No obvious neuro-muscular anomalies detected.  Muscle Tone/Strength:Functionally intact. No obvious neuro-muscular anomalies detected.  Sensory (Neurological):Unimpaired  Sensory (Neurological):Unimpaired  Palpation:No palpable anomalies  Palpation:No palpable anomalies  Provocative Test(s): Phalen's test:deferred Tinel's test:deferred Apley's  scratch test (touch opposite shoulder): Action 1 (Across chest):deferred Action 2 (Overhead):deferred Action 3 (LB reach):deferred   Provocative Test(s): Phalen's test:deferred Tinel's test:deferred Apley's scratch test (touch opposite shoulder): Action 1 (Across chest):deferred Action 2 (Overhead):deferred Action 3 (LB reach):deferred      Assessment   Status Diagnosis  Controlled Controlled Controlled 1. Cervical fusion syndrome   2. Cervical radicular pain   3. Chronic pain syndrome   4. S/P cervical spinal fusion      Plan of Care   Tony Cardenas has a current medication list which includes the following long-term medication(s): albuterol, ezetimibe, fluticasone, lisinopril, metformin, and pregabalin.  1.  Temporary increase in hydrocodone from 5 mg 4 times daily as needed to 7.5 mg 4 times daily as needed given increased pain in the context of the winter months.  Also recommend heat application via heating pad to his neck in the morning for at least 15 to 20 minutes and performing range of motion exercises prior to moving for the day.  We will reduce dose back down to 5 mg 4 times daily as needed in the spring. 2.  Refill of Lyrica as below. 3.  Continue Robaxin as prescribed. 4.  Follow-up in 3 months  Pharmacotherapy (Medications Ordered): Meds ordered this encounter  Medications  . HYDROcodone-acetaminophen (NORCO) 7.5-325 MG tablet    Sig: Take 1 tablet by mouth every 6 (six) hours as needed for severe pain. Must last 30 days.    Dispense:  120 tablet    Refill:  0    Chronic Pain. (STOP Act - Not applicable). Fill one day early if closed on scheduled refill date.  Marland Kitchen HYDROcodone-acetaminophen (NORCO) 7.5-325 MG tablet    Sig: Take 1 tablet by mouth every 6 (six) hours as needed for severe pain. Must last 30 days.    Dispense:  120 tablet    Refill:  0    Chronic Pain. (STOP Act - Not applicable). Fill one day early if closed on scheduled  refill date.  Marland Kitchen HYDROcodone-acetaminophen (NORCO) 7.5-325 MG tablet    Sig: Take 1 tablet by mouth every 6 (six) hours as needed for severe pain. Must last 30 days.    Dispense:  120 tablet    Refill:  0    Chronic Pain. (STOP Act - Not applicable). Fill one day early if closed on scheduled refill date.  Marland Kitchen  pregabalin (LYRICA) 50 MG capsule    Sig: Take 1-2 capsules (50-100 mg total) by mouth at bedtime.    Dispense:  60 capsule    Refill:  2   Follow-up plan:   Return in about 3 months (around 02/19/2020) for Medication Management, in person.   Recent Visits No visits were found meeting these conditions. Showing recent visits within past 90 days and meeting all other requirements Today's Visits Date Type Provider Dept  11/19/19 Office Visit Gillis Santa, MD [facility]  Showing today's visits and meeting all other requirements Future Appointments No visits were found meeting these conditions. Showing future appointments within next 90 days and meeting all other requirements  I discussed the assessment and treatment plan with the patient. The patient was provided an opportunity to ask questions and all were answered. The patient agreed with the plan and demonstrated an understanding of the instructions.  Patient advised to call back or seek an in-person evaluation if the symptoms or condition worsens.  Duration of encounter: 27mnutes.  Note by: BGillis Santa MD Date: 11/19/2019; Time: 8:33 AM

## 2019-11-26 DIAGNOSIS — S8391XA Sprain of unspecified site of right knee, initial encounter: Secondary | ICD-10-CM | POA: Diagnosis not present

## 2019-11-26 DIAGNOSIS — M1711 Unilateral primary osteoarthritis, right knee: Secondary | ICD-10-CM | POA: Diagnosis not present

## 2019-11-28 ENCOUNTER — Other Ambulatory Visit
Admission: RE | Admit: 2019-11-28 | Discharge: 2019-11-28 | Disposition: A | Discharge status: Home / Self Care | Payer: 59 | Attending: Podiatry | Admitting: Podiatry

## 2019-11-28 DIAGNOSIS — B351 Tinea unguium: Secondary | ICD-10-CM | POA: Diagnosis not present

## 2019-11-28 LAB — AST: AST: 26 U/L (ref 15–41)

## 2019-11-28 LAB — ALT: ALT: 20 U/L (ref 0–44)

## 2019-11-29 ENCOUNTER — Other Ambulatory Visit: Payer: Self-pay | Admitting: Podiatry

## 2019-12-14 DIAGNOSIS — S8391XA Sprain of unspecified site of right knee, initial encounter: Secondary | ICD-10-CM | POA: Diagnosis not present

## 2019-12-14 DIAGNOSIS — M25561 Pain in right knee: Secondary | ICD-10-CM | POA: Diagnosis not present

## 2019-12-16 ENCOUNTER — Encounter: Payer: Self-pay | Admitting: Family Medicine

## 2019-12-16 ENCOUNTER — Other Ambulatory Visit: Payer: Self-pay | Admitting: Family Medicine

## 2019-12-16 ENCOUNTER — Other Ambulatory Visit: Payer: Self-pay

## 2019-12-16 ENCOUNTER — Ambulatory Visit (INDEPENDENT_AMBULATORY_CARE_PROVIDER_SITE_OTHER): Payer: 59 | Admitting: Family Medicine

## 2019-12-16 VITALS — BP 121/70 | HR 82 | Temp 97.9°F | Ht 70.12 in | Wt 246.6 lb

## 2019-12-16 DIAGNOSIS — Z1211 Encounter for screening for malignant neoplasm of colon: Secondary | ICD-10-CM | POA: Diagnosis not present

## 2019-12-16 DIAGNOSIS — I10 Essential (primary) hypertension: Secondary | ICD-10-CM

## 2019-12-16 DIAGNOSIS — E1165 Type 2 diabetes mellitus with hyperglycemia: Secondary | ICD-10-CM | POA: Diagnosis not present

## 2019-12-16 DIAGNOSIS — D75839 Thrombocytosis, unspecified: Secondary | ICD-10-CM

## 2019-12-16 DIAGNOSIS — E782 Mixed hyperlipidemia: Secondary | ICD-10-CM | POA: Diagnosis not present

## 2019-12-16 DIAGNOSIS — E559 Vitamin D deficiency, unspecified: Secondary | ICD-10-CM | POA: Diagnosis not present

## 2019-12-16 LAB — BAYER DCA HB A1C WAIVED: HB A1C (BAYER DCA - WAIVED): 7.8 % — ABNORMAL HIGH (ref <7.0)

## 2019-12-16 MED ORDER — LISINOPRIL 5 MG PO TABS
5.0000 mg | ORAL_TABLET | Freq: Every day | ORAL | 1 refills | Status: DC
Start: 2019-12-16 — End: 2020-06-30

## 2019-12-16 MED ORDER — EZETIMIBE 10 MG PO TABS
10.0000 mg | ORAL_TABLET | Freq: Every day | ORAL | 1 refills | Status: DC
Start: 2019-12-16 — End: 2020-02-17

## 2019-12-16 MED ORDER — VITAMIN D (ERGOCALCIFEROL) 1.25 MG (50000 UNIT) PO CAPS
ORAL_CAPSULE | ORAL | 1 refills | Status: DC
Start: 2019-12-16 — End: 2020-06-30

## 2019-12-16 MED ORDER — METFORMIN HCL ER 500 MG PO TB24
1000.0000 mg | ORAL_TABLET | Freq: Two times a day (BID) | ORAL | 1 refills | Status: DC
Start: 2019-12-16 — End: 2020-06-30

## 2019-12-16 MED ORDER — METHOCARBAMOL 500 MG PO TABS
ORAL_TABLET | ORAL | 1 refills | Status: DC
Start: 2019-12-16 — End: 2020-06-30

## 2019-12-16 MED ORDER — ALBUTEROL SULFATE HFA 108 (90 BASE) MCG/ACT IN AERS: 2.0000 | INHALATION_SPRAY | Freq: Four times a day (QID) | RESPIRATORY_TRACT | 3 refills | Status: DC | PRN

## 2019-12-16 MED ORDER — EMPAGLIFLOZIN 25 MG PO TABS
25.0000 mg | ORAL_TABLET | Freq: Every day | ORAL | 1 refills | Status: DC
Start: 2019-12-16 — End: 2020-06-30

## 2019-12-16 NOTE — Assessment & Plan Note (Signed)
Stable with A1c of 7.8. Will continue to work on diet and exercise and recheck 3 months. Call with any concerns.

## 2019-12-16 NOTE — Assessment & Plan Note (Signed)
Rechecking labs today. Await results. Treat as needed.  °

## 2019-12-16 NOTE — Assessment & Plan Note (Signed)
Under good control on current regimen. Continue current regimen. Continue to monitor. Call with any concerns. Refills given. Labs drawn today.   

## 2019-12-16 NOTE — Progress Notes (Signed)
BP 121/70   Pulse 82   Temp 97.9 F (36.6 C) (Oral)   Ht 5' 10.12" (1.781 m)   Wt 246 lb 9.6 oz (111.9 kg)   SpO2 98%   BMI 35.26 kg/m    Subjective:    Patient ID: Tony Cardenas, male    DOB: May 22, 1970, 49 y.o.   MRN: [id number]  HPI: Tony Cardenas is a 49 y.o. male  Chief Complaint  Patient presents with  . Diabetes   DIABETES Hypoglycemic episodes:no Polydipsia/polyuria: no Visual disturbance: no Chest pain: no Paresthesias: yes Glucose Monitoring: yes  Accucheck frequency: Daily, 160s Taking Insulin?: no Blood Pressure Monitoring: not checking Retinal Examination: Up to Date Foot Exam: Up to Date Diabetic Education: Completed Pneumovax: Up to Date Influenza: Up to Date Aspirin: no  HYPERTENSION / [place] Satisfied with current treatment? yes Duration of hypertension: chronic BP monitoring frequency: not checking BP medication side effects: no Past BP meds: lisinopril Duration of hyperlipidemia: chronic Cholesterol medication side effects: no Cholesterol supplements: none Past cholesterol medications: zetia Medication compliance: excellent compliance Aspirin: yes Recent stressors: no Recurrent headaches: no Visual changes: no Palpitations: no Dyspnea: no Chest pain: no Lower extremity edema: no Dizzy/lightheaded: no   Relevant past medical, surgical, family and social history reviewed and updated as indicated. Interim medical history since our last visit reviewed. Allergies and medications reviewed and updated.  Review of Systems  Constitutional: Negative.   Respiratory: Negative.   Cardiovascular: Negative.   Gastrointestinal: Negative.   Musculoskeletal: Negative.   Neurological: Negative.   Psychiatric/Behavioral: Negative.     Per HPI unless specifically indicated above     Objective:    BP 121/70   Pulse 82   Temp 97.9 F (36.6 C) (Oral)   Ht 5' 10.12" (1.781 m)   Wt 246 lb 9.6 oz (111.9 kg)   SpO2 98%    BMI 35.26 kg/m   Wt Readings from Last 3 Encounters:  12/16/19 246 lb 9.6 oz (111.9 kg)  11/19/19 235 lb (106.6 kg)  09/13/19 248 lb (112.5 kg)    Physical Exam Vitals and nursing note reviewed.  Constitutional:      General: He is not in acute distress.    Appearance: Normal appearance. He is not ill-appearing, toxic-appearing or diaphoretic.  HENT:     Head: Normocephalic and atraumatic.     Right Ear: External ear normal.     Left Ear: External ear normal.     Nose: Nose normal.     Mouth/Throat:     Mouth: Mucous membranes are moist.     Pharynx: Oropharynx is clear.  Eyes:     General: No scleral icterus.       Right eye: No discharge.        Left eye: No discharge.     Extraocular Movements: Extraocular movements intact.     Conjunctiva/sclera: Conjunctivae normal.     Pupils: Pupils are equal, round, and reactive to light.  Cardiovascular:     Rate and Rhythm: Normal rate and regular rhythm.     Pulses: Normal pulses.     Heart sounds: Normal heart sounds. No murmur heard.  No friction rub. No gallop.   Pulmonary:     Effort: Pulmonary effort is normal. No respiratory distress.     Breath sounds: Normal breath sounds. No stridor. No wheezing, rhonchi or rales.  Chest:     Chest wall: No tenderness.  Musculoskeletal:        General: Normal  range of motion.     Cervical back: Normal range of motion and neck supple.  Skin:    General: Skin is warm and dry.     Capillary Refill: Capillary refill takes less than 2 seconds.     Coloration: Skin is not jaundiced or pale.     Findings: No bruising, erythema, lesion or rash.  Neurological:     General: No focal deficit present.     Mental Status: He is alert and oriented to person, place, and time. Mental status is at baseline.  Psychiatric:        Mood and Affect: Mood normal.        Behavior: Behavior normal.        Thought Content: Thought content normal.        Judgment: Judgment normal.     Results for  orders placed or performed in visit on 12/16/19  Bayer DCA Hb A1c Waived  Result Value Ref Range   HB A1C (BAYER DCA - WAIVED) 7.8 (H) <7.0 %  Comprehensive metabolic panel  Result Value Ref Range   Glucose 244 (H) 65 - 99 mg/dL   BUN 10 6 - 24 mg/dL   Creatinine, Ser 0.90 0.76 - 1.27 mg/dL   GFR calc non Af Amer 99 >59 mL/min/1.73   GFR calc Af Amer 114 >59 mL/min/1.73   BUN/Creatinine Ratio 11 9 - 20   Sodium 137 134 - 144 mmol/L   Potassium 3.9 3.5 - 5.2 mmol/L   Chloride 100 96 - 106 mmol/L   CO2 15 (L) 20 - 29 mmol/L   Calcium 9.3 8.7 - 10.2 mg/dL   Total Protein 6.8 6.0 - 8.5 g/dL   Albumin 4.6 3.8 - 4.9 g/dL   Globulin, Total 2.2 1.5 - 4.5 g/dL   Albumin/Globulin Ratio 2.1 1.2 - 2.2   Bilirubin Total 0.2 0.0 - 1.2 mg/dL   Alkaline Phosphatase 79 44 - 121 IU/L   AST 13 0 - 40 IU/L   ALT 13 0 - 44 IU/L  Lipid Panel w/o Chol/HDL Ratio  Result Value Ref Range   Cholesterol, Total 244 (H) 100 - 199 mg/dL   Triglycerides 1,078 (HH) 0 - 149 mg/dL   HDL 29 (L) >39 mg/dL   VLDL Cholesterol Cal Comment (A) 5 - 40 mg/dL   LDL Chol Calc (NIH) Comment (A) 0 - 99 mg/dL  CBC with Differential/Platelet  Result Value Ref Range   WBC 8.3 3.4 - 10.8 x10E3/uL   RBC 4.89 4.14 - 5.80 x10E6/uL   Hemoglobin 15.4 13.0 - 17.7 g/dL   Hematocrit 44.2 37.5 - 51.0 %   MCV 90 79 - 97 fL   MCH 31.5 26.6 - 33.0 pg   MCHC 34.8 31 - 35 g/dL   RDW 13.1 11.6 - 15.4 %   Platelets 380 150 - 450 x10E3/uL   Neutrophils 62 Not Estab. %   Lymphs 30 Not Estab. %   Monocytes 5 Not Estab. %   Eos 1 Not Estab. %   Basos 1 Not Estab. %   Neutrophils Absolute 5.2 1.40 - 7.00 x10E3/uL   Lymphocytes Absolute 2.5 0 - 3 x10E3/uL   Monocytes Absolute 0.4 0 - 0 x10E3/uL   EOS (ABSOLUTE) 0.1 0.0 - 0.4 x10E3/uL   Basophils Absolute 0.1 0 - 0 x10E3/uL   Immature Granulocytes 1 Not Estab. %   Immature Grans (Abs) 0.1 0.0 - 0.1 x10E3/uL  VITAMIN D 25 Hydroxy (Vit-D Deficiency, Fractures)  Result Value Ref  Range     Vit D, 25-Hydroxy 29.7 (L) 30.0 - 100.0 ng/mL      Assessment & Plan:   Problem List Items Addressed This Visit      Cardiovascular and Mediastinum   HTN (hypertension) - Primary    Under good control on current regimen. Continue current regimen. Continue to monitor. Call with any concerns. Refills given. Labs drawn today.       Relevant Medications   lisinopril (ZESTRIL) 5 MG tablet   ezetimibe (ZETIA) 10 MG tablet   Other Relevant Orders   Comprehensive metabolic panel (Completed)     Endocrine   Type 2 diabetes mellitus with hyperglycemia (HCC)    Stable with A1c of 7.8. Will continue to work on diet and exercise and recheck 3 months. Call with any concerns.       Relevant Medications   metFORMIN (GLUCOPHAGE-XR) 500 MG 24 hr tablet   lisinopril (ZESTRIL) 5 MG tablet   empagliflozin (JARDIANCE) 25 MG TABS tablet   Other Relevant Orders   Bayer DCA Hb A1c Waived (Completed)   Comprehensive metabolic panel (Completed)     Hematopoietic and Hemostatic   Thrombocytosis    Rechecking labs today. Await results. Treat as needed.       Relevant Orders   Comprehensive metabolic panel (Completed)   CBC with Differential/Platelet (Completed)     Other   Hyperlipidemia    Under good control on current regimen. Continue current regimen. Continue to monitor. Call with any concerns. Refills given. Labs drawn today.        Relevant Medications   lisinopril (ZESTRIL) 5 MG tablet   ezetimibe (ZETIA) 10 MG tablet   Other Relevant Orders   Comprehensive metabolic panel (Completed)   Lipid Panel w/o Chol/HDL Ratio (Completed)   VITAMIN D 25 Hydroxy (Vit-D Deficiency, Fractures) (Completed)   Vitamin D deficiency    Rechecking labs today. Await results. Treat as needed.       Relevant Orders   Comprehensive metabolic panel (Completed)    Other Visit Diagnoses    Screening for colon cancer       Referal to GI placed today.   Relevant Orders   Ambulatory referral to  Gastroenterology       Follow up plan: Return in about 3 months (around 03/15/2020).

## 2019-12-17 LAB — COMPREHENSIVE METABOLIC PANEL
ALT: 13 IU/L (ref 0–44)
AST: 13 IU/L (ref 0–40)
Albumin/Globulin Ratio: 2.1 (ref 1.2–2.2)
Albumin: 4.6 g/dL (ref 3.8–4.9)
Alkaline Phosphatase: 79 IU/L (ref 44–121)
BUN/Creatinine Ratio: 11 (ref 9–20)
BUN: 10 mg/dL (ref 6–24)
Bilirubin Total: 0.2 mg/dL (ref 0.0–1.2)
CO2: 15 mmol/L — ABNORMAL LOW (ref 20–29)
Calcium: 9.3 mg/dL (ref 8.7–10.2)
Chloride: 100 mmol/L (ref 96–106)
Creatinine, Ser: 0.9 mg/dL (ref 0.76–1.27)
GFR calc Af Amer: 114 mL/min/{1.73_m2} (ref >59)
GFR calc non Af Amer: 99 mL/min/{1.73_m2} (ref >59)
Globulin, Total: 2.2 g/dL (ref 1.5–4.5)
Glucose: 244 mg/dL — ABNORMAL HIGH (ref 65–99)
Potassium: 3.9 mmol/L (ref 3.5–5.2)
Sodium: 137 mmol/L (ref 134–144)
Total Protein: 6.8 g/dL (ref 6.0–8.5)

## 2019-12-17 LAB — CBC WITH DIFFERENTIAL/PLATELET
Basophils Absolute: 0.1 10*3/uL (ref 0.0–0.2)
Basos: 1 %
EOS (ABSOLUTE): 0.1 10*3/uL (ref 0.0–0.4)
Eos: 1 %
Hematocrit: 44.2 % (ref 37.5–51.0)
Hemoglobin: 15.4 g/dL (ref 13.0–17.7)
Immature Grans (Abs): 0.1 10*3/uL (ref 0.0–0.1)
Immature Granulocytes: 1 %
Lymphocytes Absolute: 2.5 10*3/uL (ref 0.7–3.1)
Lymphs: 30 %
MCH: 31.5 pg (ref 26.6–33.0)
MCHC: 34.8 g/dL (ref 31.5–35.7)
MCV: 90 fL (ref 79–97)
Monocytes Absolute: 0.4 10*3/uL (ref 0.1–0.9)
Monocytes: 5 %
Neutrophils Absolute: 5.2 10*3/uL (ref 1.4–7.0)
Neutrophils: 62 %
Platelets: 380 10*3/uL (ref 150–450)
RBC: 4.89 x10E6/uL (ref 4.14–5.80)
RDW: 13.1 % (ref 11.6–15.4)
WBC: 8.3 10*3/uL (ref 3.4–10.8)

## 2019-12-17 LAB — LIPID PANEL W/O CHOL/HDL RATIO
Cholesterol, Total: 244 mg/dL — ABNORMAL HIGH (ref 100–199)
HDL: 29 mg/dL — ABNORMAL LOW (ref >39)
Triglycerides: 1078 mg/dL (ref 0–149)

## 2019-12-17 LAB — VITAMIN D 25 HYDROXY (VIT D DEFICIENCY, FRACTURES): Vit D, 25-Hydroxy: 29.7 ng/mL — ABNORMAL LOW (ref 30.0–100.0)

## 2019-12-18 ENCOUNTER — Telehealth: Payer: Self-pay

## 2019-12-18 ENCOUNTER — Telehealth: Payer: 59

## 2019-12-18 NOTE — Telephone Encounter (Signed)
Contacted patient this morning to complete triage and schedule colonoscopy.  I was unable to reach him.  LVM for him to please call the office back to reschedule his triage call.  Thanks,  [place], [place]

## 2019-12-22 ENCOUNTER — Other Ambulatory Visit: Payer: Self-pay | Admitting: Family Medicine

## 2019-12-22 MED ORDER — OMEGA-3-ACID ETHYL ESTERS 1 G PO CAPS: 1.0000 g | ORAL_CAPSULE | Freq: Two times a day (BID) | ORAL | 3 refills | Status: DC

## 2019-12-23 ENCOUNTER — Other Ambulatory Visit: Payer: Self-pay | Admitting: Family Medicine

## 2019-12-24 ENCOUNTER — Encounter: Payer: Self-pay | Admitting: Family Medicine

## 2019-12-24 NOTE — Telephone Encounter (Signed)
Please advise 

## 2020-01-02 ENCOUNTER — Encounter: Payer: Self-pay | Admitting: Family Medicine

## 2020-01-06 ENCOUNTER — Telehealth: Payer: Self-pay

## 2020-01-06 ENCOUNTER — Other Ambulatory Visit: Payer: 59

## 2020-01-06 DIAGNOSIS — Z20822 Contact with and (suspected) exposure to covid-19: Secondary | ICD-10-CM | POA: Diagnosis not present

## 2020-01-06 NOTE — Telephone Encounter (Signed)
Fyi.

## 2020-01-06 NOTE — Telephone Encounter (Signed)
Copied from CRM [id number]. Topic: General - Other >> Jan 06, 2020  9:09 AM Jaquita Rector A wrote: Reason for CRM: Patient wife Claris Che called in to let Dr Laural Benes know they are both going for Covid testing today due to symptoms.

## 2020-01-07 LAB — NOVEL CORONAVIRUS, NAA: SARS-CoV-2, NAA: DETECTED — AB

## 2020-01-07 LAB — SARS-COV-2, NAA 2 DAY TAT

## 2020-01-08 ENCOUNTER — Telehealth (HOSPITAL_COMMUNITY): Payer: Self-pay | Admitting: Family

## 2020-01-08 DIAGNOSIS — U071 COVID-19: Secondary | ICD-10-CM

## 2020-01-08 NOTE — Telephone Encounter (Signed)
Called to discuss with Tony Cardenas about Covid symptoms and potential candidacy for the use of sotrovimab, a combination monoclonal antibody infusion for those with mild to moderate Covid symptoms and at a high risk of hospitalization.     Pt is qualified for this infusion at the infusion center due to co-morbid conditions and/or a member of an at-risk group, however unable to reach patient. VM left.   Tony Louthan,NP

## 2020-01-13 ENCOUNTER — Other Ambulatory Visit: Payer: Self-pay

## 2020-01-13 ENCOUNTER — Other Ambulatory Visit: Payer: Self-pay | Admitting: Family Medicine

## 2020-01-13 ENCOUNTER — Ambulatory Visit
Admission: RE | Admit: 2020-01-13 | Discharge: 2020-01-13 | Disposition: A | Discharge status: Home / Self Care | Payer: 59 | Source: Ambulatory Visit | Attending: Family Medicine | Admitting: Family Medicine

## 2020-01-13 ENCOUNTER — Encounter: Payer: Self-pay | Admitting: Family Medicine

## 2020-01-13 ENCOUNTER — Ambulatory Visit (INDEPENDENT_AMBULATORY_CARE_PROVIDER_SITE_OTHER): Payer: 59 | Admitting: Family Medicine

## 2020-01-13 VITALS — BP 119/72 | HR 77 | Temp 98.1°F | Wt 233.4 lb

## 2020-01-13 DIAGNOSIS — U071 COVID-19: Secondary | ICD-10-CM | POA: Insufficient documentation

## 2020-01-13 DIAGNOSIS — R918 Other nonspecific abnormal finding of lung field: Secondary | ICD-10-CM | POA: Diagnosis not present

## 2020-01-13 DIAGNOSIS — Z9889 Other specified postprocedural states: Secondary | ICD-10-CM | POA: Diagnosis not present

## 2020-01-13 MED ORDER — ALBUTEROL SULFATE HFA 108 (90 BASE) MCG/ACT IN AERS: 2.0000 | INHALATION_SPRAY | Freq: Four times a day (QID) | RESPIRATORY_TRACT | 3 refills | Status: DC | PRN

## 2020-01-13 NOTE — Progress Notes (Signed)
BP 119/72   Pulse 77   Temp 98.1 F (36.7 C)   Wt 233 lb 6.4 oz (105.9 kg)   SpO2 94%   BMI 33.38 kg/m    Subjective:    Patient ID: Tony Cardenas, male    DOB: Dec 04, 1970, 50 y.o.   MRN: [id number]  HPI: Tony Cardenas is a 50 y.o. male  Chief Complaint  Patient presents with  . post COVID    Pt tested positive for covid on 12/27.    He notes that he is on day 14 of his symptoms, but he only tested about a week after he developed symptoms. He got it from his kids and his ex-wife. He notes that he was really sick last week but has been feeling better since then. He notes that he continues to "not feel right." He notes that his breathing has been doing OK. He continues to have some cough and some wheezing. He denies any SOB. He notes that he does not have any fevers. No more body aches. He had been having issues with his memory and being confused last week, but that is resolved from last week. He would like a CXR because he is concerned, not that he is having any symptoms.   Relevant past medical, surgical, family and social history reviewed and updated as indicated. Interim medical history since our last visit reviewed. Allergies and medications reviewed and updated.  Review of Systems  Constitutional: Positive for activity change and fatigue. Negative for appetite change, chills, diaphoresis, fever and unexpected weight change.  HENT: Negative.   Respiratory: Positive for cough and wheezing. Negative for apnea, choking, chest tightness, shortness of breath and stridor.   Cardiovascular: Negative.   Gastrointestinal: Negative.   Musculoskeletal: Negative.   Psychiatric/Behavioral: Negative.     Per HPI unless specifically indicated above     Objective:    BP 119/72   Pulse 77   Temp 98.1 F (36.7 C)   Wt 233 lb 6.4 oz (105.9 kg)   SpO2 94%   BMI 33.38 kg/m   Wt Readings from Last 3 Encounters:  01/13/20 233 lb 6.4 oz (105.9 kg)  12/16/19 246 lb 9.6 oz  (111.9 kg)  11/19/19 235 lb (106.6 kg)    Physical Exam Vitals and nursing note reviewed.  Constitutional:      General: He is not in acute distress.    Appearance: Normal appearance. He is not ill-appearing, toxic-appearing or diaphoretic.  HENT:     Head: Normocephalic and atraumatic.     Right Ear: External ear normal.     Left Ear: External ear normal.     Nose: Nose normal.     Mouth/Throat:     Mouth: Mucous membranes are moist.     Pharynx: Oropharynx is clear.  Eyes:     General: No scleral icterus.       Right eye: No discharge.        Left eye: No discharge.     Extraocular Movements: Extraocular movements intact.     Conjunctiva/sclera: Conjunctivae normal.     Pupils: Pupils are equal, round, and reactive to light.  Cardiovascular:     Rate and Rhythm: Normal rate and regular rhythm.     Pulses: Normal pulses.     Heart sounds: Normal heart sounds. No murmur heard. No friction rub. No gallop.   Pulmonary:     Effort: Pulmonary effort is normal. No respiratory distress.     Breath sounds:  Normal breath sounds. No stridor. No wheezing, rhonchi or rales.  Chest:     Chest wall: No tenderness.  Musculoskeletal:        General: Normal range of motion.     Cervical back: Normal range of motion and neck supple.  Skin:    General: Skin is warm and dry.     Capillary Refill: Capillary refill takes less than 2 seconds.     Coloration: Skin is not jaundiced or pale.     Findings: No bruising, erythema, lesion or rash.  Neurological:     General: No focal deficit present.     Mental Status: He is alert and oriented to person, place, and time. Mental status is at baseline.  Psychiatric:        Mood and Affect: Mood normal.        Behavior: Behavior normal.        Thought Content: Thought content normal.        Judgment: Judgment normal.     Results for orders placed or performed in visit on 01/06/20  Novel Coronavirus, NAA ([facility])   Specimen: Nasopharyngeal(NP)  swabs in vial transport medium   Nasopharynge  Result Value Ref Range   SARS-CoV-2, NAA Detected (A) Not Detected  SARS-COV-2, NAA 2 DAY TAT   Nasopharynge  Result Value Ref Range   SARS-CoV-2, NAA 2 DAY TAT Performed       Assessment & Plan:   Problem List Items Addressed This Visit   None   Visit Diagnoses    COVID-19    -  Primary   Feeling much better. Would like a CXR- so ordered today. Inhaler to help with cough. Call with any concerns or if getting worse. Continue to monitor   Relevant Orders   DG Chest 2 View       Follow up plan: Return As scheduled.

## 2020-01-14 ENCOUNTER — Encounter: Payer: Self-pay | Admitting: Family Medicine

## 2020-01-15 NOTE — Telephone Encounter (Signed)
Please advise 

## 2020-01-30 ENCOUNTER — Encounter: Payer: Self-pay | Admitting: Family Medicine

## 2020-02-03 ENCOUNTER — Other Ambulatory Visit
Admission: RE | Admit: 2020-02-03 | Discharge: 2020-02-03 | Disposition: A | Discharge status: Home / Self Care | Payer: 59 | Source: Ambulatory Visit | Attending: Podiatry | Admitting: Podiatry

## 2020-02-03 ENCOUNTER — Other Ambulatory Visit: Payer: Self-pay | Admitting: Podiatry

## 2020-02-03 DIAGNOSIS — E1142 Type 2 diabetes mellitus with diabetic polyneuropathy: Secondary | ICD-10-CM | POA: Insufficient documentation

## 2020-02-03 DIAGNOSIS — M79674 Pain in right toe(s): Secondary | ICD-10-CM | POA: Diagnosis not present

## 2020-02-03 DIAGNOSIS — M79675 Pain in left toe(s): Secondary | ICD-10-CM | POA: Insufficient documentation

## 2020-02-03 DIAGNOSIS — B351 Tinea unguium: Secondary | ICD-10-CM | POA: Diagnosis not present

## 2020-02-03 LAB — ALT: ALT: 19 U/L (ref 0–44)

## 2020-02-03 LAB — AST: AST: 19 U/L (ref 15–41)

## 2020-02-17 ENCOUNTER — Other Ambulatory Visit: Payer: Self-pay | Admitting: Family Medicine

## 2020-02-20 ENCOUNTER — Ambulatory Visit
Payer: 59 | Attending: Student in an Organized Health Care Education/Training Program | Admitting: Student in an Organized Health Care Education/Training Program

## 2020-02-20 ENCOUNTER — Other Ambulatory Visit: Payer: Self-pay | Admitting: Student in an Organized Health Care Education/Training Program

## 2020-02-20 ENCOUNTER — Other Ambulatory Visit: Payer: Self-pay

## 2020-02-20 ENCOUNTER — Encounter: Payer: Self-pay | Admitting: Student in an Organized Health Care Education/Training Program

## 2020-02-20 VITALS — BP 127/83 | HR 101 | Temp 97.9°F | Resp 18 | Ht 71.0 in | Wt 240.0 lb

## 2020-02-20 DIAGNOSIS — Q761 Klippel-Feil syndrome: Secondary | ICD-10-CM | POA: Diagnosis not present

## 2020-02-20 DIAGNOSIS — M5412 Radiculopathy, cervical region: Secondary | ICD-10-CM | POA: Insufficient documentation

## 2020-02-20 DIAGNOSIS — G894 Chronic pain syndrome: Secondary | ICD-10-CM | POA: Insufficient documentation

## 2020-02-20 DIAGNOSIS — Z981 Arthrodesis status: Secondary | ICD-10-CM | POA: Diagnosis not present

## 2020-02-20 MED ORDER — PREGABALIN 50 MG PO CAPS: 50.0000 mg | ORAL_CAPSULE | Freq: Every day | ORAL | 2 refills | Status: DC

## 2020-02-20 MED ORDER — HYDROCODONE-ACETAMINOPHEN 7.5-325 MG PO TABS: 1.0000 | ORAL_TABLET | Freq: Four times a day (QID) | ORAL | 0 refills | Status: DC | PRN

## 2020-02-20 MED ORDER — HYDROCODONE-ACETAMINOPHEN 7.5-325 MG PO TABS: 1.0000 | ORAL_TABLET | Freq: Four times a day (QID) | ORAL | 0 refills | Status: AC | PRN

## 2020-02-20 NOTE — Progress Notes (Signed)
PROVIDER NOTE: Information contained herein reflects review and annotations entered in association with encounter. Interpretation of such information and data should be left to medically-trained personnel. Information provided to patient can be located elsewhere in the medical record under "Patient Instructions". Document created using STT-dictation technology, any transcriptional errors that may result from process are unintentional.    Patient: Tony Cardenas  Service Category: E/M  Provider: Gillis Santa, MD  DOB: November 05, 1970  DOS: 02/20/2020  Specialty: Interventional Pain Management  MRN: [id number]  Setting: Ambulatory outpatient  PCP: Valerie Roys, DO  Type: Established Patient    Referring Provider: Valerie Roys, DO  Location: Office  Delivery: Face-to-face     HPI  [name], a 50 y.o. 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 Tony Cardenas primary complain today is Neck Pain and Joint Pain Last encounter: My last encounter with him was on 11/19/2019. Pertinent problems: Tony Cardenas Cardenas S/P cervical spinal fusion; Cervical radicular pain; Cervical fusion syndrome; Chronic pain syndrome; and Cervical spondylosis with myelopathy and radiculopathy on their pertinent problem list. Pain Assessment: Severity of Chronic pain is reported as a 8 /10. Location: Neck  / . Onset: More than a month ago. Quality: Burning,Sharp,Shooting. Timing: Constant. Modifying factor(s): meds help some, does not take pain away;  heat, ice. Vitals:  height is 5' 11"  (1.803 m) and weight is 240 lb (108.9 kg). His temporal temperature is 97.9 F (36.6 C). His blood pressure is 127/83 and his pulse is 101 (abnormal). His respiration is 18 and oxygen saturation is 98%.   Reason for encounter: medication management.    No change in medical history since last visit other than right knee sprain.  He is in the process of applying for disability.  Patient's pain is at  baseline.  Patient continues multimodal pain regimen as prescribed.  States that it provides pain relief and improvement in functional status.   Pharmacotherapy Assessment   Analgesic: Hydrocodone 7.5 mg QID PRN   Monitoring: [place] PMP: PDMP reviewed during this encounter.       Pharmacotherapy: No side-effects or adverse reactions reported. Compliance: No problems identified. Effectiveness: Clinically acceptable.  Rise Patience, RN  02/20/2020  8:17 AM  Sign when Signing Visit Nursing Pain Medication Assessment:  Safety precautions to be maintained throughout the outpatient stay will include: orient to surroundings, keep bed in low position, maintain call bell within reach at all times, provide assistance with transfer out of bed and ambulation.  Medication Inspection Compliance: Pill count conducted under aseptic conditions, in front of the patient. Neither the pills nor the bottle was removed from the patient's sight at any time. Once count was completed pills were immediately returned to the patient in their original bottle.  Medication: Hydrocodone/APAP Pill/Patch Count: 36 of 120 pills remain Pill/Patch Appearance: Markings consistent with prescribed medication Bottle Appearance: Standard pharmacy container. Clearly labeled. Filled Date: 1 / 17 / 22 Last Medication intake:  Yesterday    UDS:  Summary  Date Value Ref Range Status  02/20/2019 Note  Final    Comment:    ==================================================================== Compliance Drug Analysis, Ur ==================================================================== Test                             Result       Flag       Units Drug Present and Declared for Prescription Verification   Hydrocodone  761          EXPECTED   ng/mg creat   Hydromorphone                  480          EXPECTED   ng/mg creat   Dihydrocodeine                 100          EXPECTED   ng/mg creat   Norhydrocodone                  402          EXPECTED   ng/mg creat    Sources of hydrocodone include scheduled prescription medications.    Hydromorphone, dihydrocodeine and norhydrocodone are expected    metabolites of hydrocodone. Hydromorphone and dihydrocodeine are    also available as scheduled prescription medications.   Methocarbamol                  PRESENT      EXPECTED   Acetaminophen                  PRESENT      EXPECTED Drug Absent but Declared for Prescription Verification   Pregabalin                     Not Detected UNEXPECTED ==================================================================== Test                      Result    Flag   Units      Ref Range   Creatinine              61               mg/dL      >=20 ==================================================================== Declared Medications:  The flagging and interpretation on this report are based on the  following declared medications.  Unexpected results may arise from  inaccuracies in the declared medications.  **Note: The testing scope of this panel includes these medications:  Hydrocodone (Norco)  Methocarbamol (Robaxin)  Pregabalin (Lyrica)  **Note: The testing scope of this panel does not include small to  moderate amounts of these reported medications:  Acetaminophen (Norco)  **Note: The testing scope of this panel does not include the  following reported medications:  Albuterol  Ezetimibe (Zetia)  Fluticasone (Flonase)  Folic Acid  Lisinopril (Zestril)  Metformin (Glucophage)  Methotrexate  Vitamin D2 (Drisdol) ==================================================================== For clinical consultation, please call [phone]. ====================================================================      ROS  Constitutional: Denies any fever or chills Gastrointestinal: No reported hemesis, hematochezia, vomiting, or acute GI distress Musculoskeletal: neck pain Neurological: No reported episodes of acute  onset apraxia, aphasia, dysarthria, agnosia, amnesia, paralysis, loss of coordination, or loss of consciousness  Medication Review  HYDROcodone-acetaminophen, Sodium Fluoride, Vitamin D (Ergocalciferol), albuterol, blood glucose meter kit and supplies, empagliflozin, ezetimibe, fluticasone, folic acid, lisinopril, metFORMIN, methocarbamol, methotrexate, omega-3 acid ethyl esters, pregabalin, and terbinafine  History Review  Allergy: Tony Cardenas is allergic to atorvastatin and hydromorphone hcl. Drug: Tony Cardenas  reports no history of drug use. Alcohol:  reports no history of alcohol use. Tobacco:  reports that he quit smoking about 11 months ago. His smoking use included cigarettes. He Cardenas a 10.00 pack-year smoking history. He quit smokeless tobacco use about 3 years ago.  His smokeless tobacco use included chew. Social: Tony Cardenas  reports that he  quit smoking about 11 months ago. His smoking use included cigarettes. He Cardenas a 10.00 pack-year smoking history. He quit smokeless tobacco use about 3 years ago.  His smokeless tobacco use included chew. He reports that he does not drink alcohol and does not use drugs. Medical:  Cardenas a past medical history of Arthritis, COPD (chronic obstructive pulmonary disease) ([place]), Diabetes mellitus without complication ([place]), GERD (gastroesophageal reflux disease), Hyperlipidemia, Hypertension, and Neck pain (1989). Surgical: Tony Cardenas  Cardenas a past surgical history that includes Neck surgery (1989); Knee surgery (Right); Colonoscopy (N/A, 06/02/2014); Polypectomy (06/02/2014); Appendectomy; Esophagogastroduodenoscopy (egd) with propofol (N/A, 07/29/2016); Back surgery; Anterior cervical corpectomy (N/A, 10/10/2018); Hernia repair (over 10 years ago); Anterior cervical corpectomy (N/A, 05/01/2019); and Posterior cervical fusion/foraminotomy (N/A, 05/01/2019). Family: family history includes Alcohol abuse in his father; COPD in his father; Cancer in his mother; Diabetes  in his brother, father, and paternal grandmother.  Laboratory Chemistry Profile   Renal Lab Results  Component Value Date   BUN 10 12/16/2019   CREATININE 0.90 12/16/2019   BCR 11 12/16/2019   GFRAA 114 12/16/2019   GFRNONAA 99 12/16/2019     Hepatic Lab Results  Component Value Date   AST 19 02/03/2020   ALT 19 02/03/2020   ALBUMIN 4.6 12/16/2019   ALKPHOS 79 12/16/2019     Electrolytes Lab Results  Component Value Date   NA 137 12/16/2019   K 3.9 12/16/2019   CL 100 12/16/2019   CALCIUM 9.3 12/16/2019     Bone Lab Results  Component Value Date   VD25OH 29.7 (L) 12/16/2019     Inflammation (CRP: Acute Phase) (ESR: Chronic Phase) Lab Results  Component Value Date   CRP 2.9 (H) 03/15/2018   ESRSEDRATE 8 11/15/2017       Note: Above Lab results reviewed.  Recent Imaging Review  DG Chest 2 View CLINICAL DATA:  History of recent COVID-19 infection  EXAM: CHEST - 2 VIEW  COMPARISON:  04/24/2017  FINDINGS: Cardiac shadow is within normal limits. Patchy airspace opacities are noted in the bases bilaterally consistent with the given clinical history. No sizable effusion is seen. No acute bony abnormality is noted. Postsurgical changes of the cervical spine are noted.  IMPRESSION: Patchy airspace opacities in the bases bilaterally consistent with the given clinical history.  Electronically Signed   By: Inez Catalina M.D.   On: 01/13/2020 16:43 Note: Reviewed        Physical Exam  General appearance: Well nourished, well developed, and well hydrated. In no apparent acute distress Mental status: Alert, oriented x 3 (person, place, & time)       Respiratory: No evidence of acute respiratory distress Eyes: PERLA Vitals: BP 127/83   Pulse (!) 101   Temp 97.9 F (36.6 C) (Temporal)   Resp 18   Ht 5' 11"  (1.803 m)   Wt 240 lb (108.9 kg)   SpO2 98%   BMI 33.47 kg/m  BMI: Estimated body mass index is 33.47 kg/m as calculated from the following:    Height as of this encounter: 5' 11"  (1.803 m).   Weight as of this encounter: 240 lb (108.9 kg). Ideal: Ideal body weight: 75.3 kg (166 lb 0.1 oz) Adjusted ideal body weight: 88.7 kg (195 lb 9.7 oz)   Cervical Spine Area Exam  Skin & Axial Inspection:Well healed scar from previous spine surgery detected Alignment:Symmetrical Functional GGY:[id number] restricted ROM, bilaterallyand pain restricted Stability:No instability detected Muscle Tone/Strength:Functionally intact. No obvious neuro-muscular anomalies detected. Sensory (  Neurological):Neuropathic pain pattern Palpation:No palpable anomalies  5 out of 5 strength bilateral upper extremity: Shoulder abduction, elbow flexion, elbow extension, thumb extension.  Upper Extremity (UE) Exam    Side:Right upper extremity  Side:Left upper extremity  Skin & Extremity Inspection:Skin color, temperature, and hair growth are WNL. No peripheral edema or cyanosis. No masses, redness, swelling, asymmetry, or associated skin lesions. No contractures.  Skin & Extremity Inspection:Skin color, temperature, and hair growth are WNL. No peripheral edema or cyanosis. No masses, redness, swelling, asymmetry, or associated skin lesions. No contractures.  Functional QTM:AUQJ restricted ROMfor shoulder  Functional FHL:KTGY restricted ROMfor shoulder  Muscle Tone/Strength:Functionally intact. No obvious neuro-muscular anomalies detected.  Muscle Tone/Strength:Functionally intact. No obvious neuro-muscular anomalies detected.  Sensory (Neurological):Neuropathic  Sensory (Neurological):Neuropathic      Assessment   Status Diagnosis  Controlled Controlled Controlled 1. Cervical fusion syndrome   2. Cervical radicular pain   3. S/P cervical spinal fusion   4. Chronic pain syndrome        Plan of Care  Tony Cardenas a current medication list which includes the following long-term  medication(s): albuterol, ezetimibe, fluticasone, lisinopril, metformin, omega-3 acid ethyl esters, and pregabalin.  Pharmacotherapy (Medications Ordered): Meds ordered this encounter  Medications  . HYDROcodone-acetaminophen (NORCO) 7.5-325 MG tablet    Sig: Take 1 tablet by mouth every 6 (six) hours as needed for severe pain. Must last 30 days.    Dispense:  120 tablet    Refill:  0    Chronic Pain. (STOP Act - Not applicable). Fill one day early if closed on scheduled refill date.  Marland Kitchen HYDROcodone-acetaminophen (NORCO) 7.5-325 MG tablet    Sig: Take 1 tablet by mouth every 6 (six) hours as needed for severe pain. Must last 30 days.    Dispense:  120 tablet    Refill:  0    Chronic Pain. (STOP Act - Not applicable). Fill one day early if closed on scheduled refill date.  Marland Kitchen HYDROcodone-acetaminophen (NORCO) 7.5-325 MG tablet    Sig: Take 1 tablet by mouth every 6 (six) hours as needed for severe pain. Must last 30 days.    Dispense:  120 tablet    Refill:  0    Chronic Pain. (STOP Act - Not applicable). Fill one day early if closed on scheduled refill date.  . pregabalin (LYRICA) 50 MG capsule    Sig: Take 1-2 capsules (50-100 mg total) by mouth at bedtime.    Dispense:  60 capsule    Refill:  2   Orders:  Orders Placed This Encounter  Procedures  . ToxASSURE Select 13 (MW), Urine    Volume: 30 ml(s). Minimum 3 ml of urine is needed. Document temperature of fresh sample. Indications: Long term (current) use of opiate analgesic [phone])    Order Specific Question:   Release to patient    Answer:   Immediate   Follow-up plan:   Return in about 3 months (around 05/19/2020) for Medication Management, in person.   Recent Visits No visits were found meeting these conditions. Showing recent visits within past 90 days and meeting all other requirements Today's Visits Date Type Provider Dept  02/20/20 Office Visit Gillis Santa, MD [facility]  Showing today's visits and  meeting all other requirements Future Appointments No visits were found meeting these conditions. Showing future appointments within next 90 days and meeting all other requirements  I discussed the assessment and treatment plan with the patient. The patient was provided  an opportunity to ask questions and all were answered. The patient agreed with the plan and demonstrated an understanding of the instructions.  Patient advised to call back or seek an in-person evaluation if the symptoms or condition worsens.  Duration of encounter: 71mnutes.  Note by: BGillis Santa MD Date: 02/20/2020; Time: 8:39 AM

## 2020-02-20 NOTE — Progress Notes (Signed)
Nursing Pain Medication Assessment:  Safety precautions to be maintained throughout the outpatient stay will include: orient to surroundings, keep bed in low position, maintain call bell within reach at all times, provide assistance with transfer out of bed and ambulation.  Medication Inspection Compliance: Pill count conducted under aseptic conditions, in front of the patient. Neither the pills nor the bottle was removed from the patient's sight at any time. Once count was completed pills were immediately returned to the patient in their original bottle.  Medication: Hydrocodone/APAP Pill/Patch Count: 36 of 120 pills remain Pill/Patch Appearance: Markings consistent with prescribed medication Bottle Appearance: Standard pharmacy container. Clearly labeled. Filled Date: 1 / 49 / 22 Last Medication intake:  Yesterday

## 2020-02-24 ENCOUNTER — Telehealth: Payer: Self-pay

## 2020-02-24 NOTE — Telephone Encounter (Signed)
Pt states he need his prescription filled a day early he will be leaving to go out of town on a Thursday. Pharmacy states they need authorization for that Hydrocodone Med.

## 2020-02-24 NOTE — Telephone Encounter (Signed)
Pharmacy contacted, ok to fill Hydrocodone one day early. Patient advised that it must last 30 days from the date it states it is to be filled.

## 2020-02-28 LAB — TOXASSURE SELECT 13 (MW), URINE

## 2020-03-16 ENCOUNTER — Encounter: Payer: Self-pay | Admitting: Family Medicine

## 2020-03-16 ENCOUNTER — Ambulatory Visit: Payer: 59 | Admitting: Family Medicine

## 2020-03-16 ENCOUNTER — Other Ambulatory Visit: Payer: Self-pay

## 2020-03-16 VITALS — BP 117/79 | HR 97 | Temp 98.2°F | Wt 236.2 lb

## 2020-03-16 DIAGNOSIS — E1165 Type 2 diabetes mellitus with hyperglycemia: Secondary | ICD-10-CM | POA: Diagnosis not present

## 2020-03-16 LAB — BAYER DCA HB A1C WAIVED: HB A1C (BAYER DCA - WAIVED): 7.6 % — ABNORMAL HIGH (ref <7.0)

## 2020-03-16 NOTE — Progress Notes (Signed)
BP 117/79   Pulse 97   Temp 98.2 F (36.8 C)   Wt 236 lb 3.2 oz (107.1 kg)   SpO2 96%   BMI 32.94 kg/m    Subjective:    Patient ID: Tony Cardenas, male    DOB: November 27, 1970, 50 y.o.   MRN: [id number]  HPI: Tony Cardenas is a 50 y.o. male  Chief Complaint  Patient presents with  . Diabetes   DIABETES Hypoglycemic episodes:no Polydipsia/polyuria: no Visual disturbance: no Chest pain: no Paresthesias: no Glucose Monitoring: yes  Accucheck frequency: occasionally Taking Insulin?: no Blood Pressure Monitoring: not checking Retinal Examination: Up to Date Foot Exam: Up to Date Diabetic Education: Completed Pneumovax: Up to Date Influenza: Up to Date Aspirin: no  Relevant past medical, surgical, family and social history reviewed and updated as indicated. Interim medical history since our last visit reviewed. Allergies and medications reviewed and updated.  Review of Systems  Constitutional: Negative.   Respiratory: Negative.   Cardiovascular: Negative.   Gastrointestinal: Negative.   Psychiatric/Behavioral: Negative.     Per HPI unless specifically indicated above     Objective:    BP 117/79   Pulse 97   Temp 98.2 F (36.8 C)   Wt 236 lb 3.2 oz (107.1 kg)   SpO2 96%   BMI 32.94 kg/m   Wt Readings from Last 3 Encounters:  03/16/20 236 lb 3.2 oz (107.1 kg)  02/20/20 240 lb (108.9 kg)  01/13/20 233 lb 6.4 oz (105.9 kg)    Physical Exam Vitals and nursing note reviewed.  Constitutional:      General: He is not in acute distress.    Appearance: Normal appearance. He is not ill-appearing, toxic-appearing or diaphoretic.  HENT:     Head: Normocephalic and atraumatic.     Right Ear: External ear normal.     Left Ear: External ear normal.     Nose: Nose normal.     Mouth/Throat:     Mouth: Mucous membranes are moist.     Pharynx: Oropharynx is clear.  Eyes:     General: No scleral icterus.       Right eye: No discharge.        Left eye:  No discharge.     Extraocular Movements: Extraocular movements intact.     Conjunctiva/sclera: Conjunctivae normal.     Pupils: Pupils are equal, round, and reactive to light.  Cardiovascular:     Rate and Rhythm: Normal rate and regular rhythm.     Pulses: Normal pulses.     Heart sounds: Normal heart sounds. No murmur heard. No friction rub. No gallop.   Pulmonary:     Effort: Pulmonary effort is normal. No respiratory distress.     Breath sounds: Normal breath sounds. No stridor. No wheezing, rhonchi or rales.  Chest:     Chest wall: No tenderness.  Musculoskeletal:        General: Normal range of motion.     Cervical back: Normal range of motion and neck supple.  Skin:    General: Skin is warm and dry.     Capillary Refill: Capillary refill takes less than 2 seconds.     Coloration: Skin is not jaundiced or pale.     Findings: No bruising, erythema, lesion or rash.  Neurological:     General: No focal deficit present.     Mental Status: He is alert and oriented to person, place, and time. Mental status is at baseline.  Psychiatric:  Mood and Affect: Mood normal.        Behavior: Behavior normal.        Thought Content: Thought content normal.        Judgment: Judgment normal.     Results for orders placed or performed in visit on 02/20/20  ToxASSURE Select 59 (MW), Urine  Result Value Ref Range   Summary Note       Assessment & Plan:   Problem List Items Addressed This Visit      Endocrine   Type 2 diabetes mellitus with hyperglycemia ([place]) - Primary    Down slighty at a1c of 7.6. Likely due to his COVID in January. Really work on diet and exercise and we'll recheck in 3 months. Call with any concerns.       Relevant Orders   Bayer DCA Hb A1c Waived       Follow up plan: Return in about 3 months (around 06/16/2020) for physical.

## 2020-03-16 NOTE — Assessment & Plan Note (Signed)
Down slighty at a1c of 7.6. Likely due to his COVID in January. Really work on diet and exercise and we'll recheck in 3 months. Call with any concerns.

## 2020-03-24 ENCOUNTER — Other Ambulatory Visit
Admission: RE | Admit: 2020-03-24 | Discharge: 2020-03-24 | Disposition: A | Discharge status: Home / Self Care | Payer: 59 | Source: Ambulatory Visit | Attending: Rheumatology | Admitting: Rheumatology

## 2020-03-24 ENCOUNTER — Other Ambulatory Visit: Payer: Self-pay | Admitting: Rheumatology

## 2020-03-24 DIAGNOSIS — Z79899 Other long term (current) drug therapy: Secondary | ICD-10-CM | POA: Insufficient documentation

## 2020-03-24 DIAGNOSIS — M4712 Other spondylosis with myelopathy, cervical region: Secondary | ICD-10-CM | POA: Diagnosis not present

## 2020-03-24 DIAGNOSIS — M4722 Other spondylosis with radiculopathy, cervical region: Secondary | ICD-10-CM | POA: Diagnosis not present

## 2020-03-24 DIAGNOSIS — M0609 Rheumatoid arthritis without rheumatoid factor, multiple sites: Secondary | ICD-10-CM | POA: Diagnosis not present

## 2020-03-24 LAB — HEPATITIS C ANTIBODY: HCV Ab: NONREACTIVE

## 2020-03-25 LAB — MISC LABCORP TEST (SEND OUT): Labcorp test code: 12708

## 2020-03-25 LAB — HEPATITIS B SURFACE ANTIBODY, QUANTITATIVE: Hep B S AB Quant (Post): <3.1 m[IU]/mL — ABNORMAL LOW (ref >9.9)

## 2020-03-25 LAB — RHEUMATOID FACTOR: Rheumatoid fact SerPl-aCnc: 10.3 IU/mL (ref <14.0)

## 2020-03-25 LAB — CYCLIC CITRUL PEPTIDE ANTIBODY, IGG/IGA: CCP Antibodies IgG/IgA: 7 units (ref 0–19)

## 2020-03-26 LAB — C-REACTIVE PROTEIN

## 2020-03-26 LAB — ANTINUCLEAR ANTIBODIES, IFA: ANA Ab, IFA: NEGATIVE

## 2020-04-10 LAB — MISC LABCORP TEST (SEND OUT): Labcorp test code: 504550

## 2020-04-13 ENCOUNTER — Other Ambulatory Visit: Payer: Self-pay | Admitting: Family Medicine

## 2020-04-13 ENCOUNTER — Other Ambulatory Visit: Payer: Self-pay

## 2020-04-13 MED ORDER — VITAMIN D (ERGOCALCIFEROL) 1.25 MG (50000 UNIT) PO CAPS
ORAL_CAPSULE | ORAL | 1 refills | Status: DC
  Filled 2020-04-13: qty 12, 84d supply, fill #0
  Filled 2020-07-05: qty 12, 84d supply, fill #1

## 2020-04-13 MED FILL — Lisinopril Tab 5 MG: ORAL | 90 days supply | Qty: 90 | Fill #0 | Status: AC

## 2020-04-13 MED FILL — Omega-3-acid Ethyl Esters Cap 1 GM: ORAL | 30 days supply | Qty: 60 | Fill #0 | Status: AC

## 2020-04-13 MED FILL — Ezetimibe Tab 10 MG: ORAL | 90 days supply | Qty: 90 | Fill #0 | Status: CN

## 2020-04-13 NOTE — Telephone Encounter (Signed)
Patient has not been seen here in our office and has no upcoming appointment. Please advise.

## 2020-04-13 NOTE — Telephone Encounter (Signed)
Requested medication (s) are due for refill today yes  Requested medication (s) are on the active medication list: yes  Last refill: 01/27/2020  Future visit scheduled: no  Notes to clinic:  this refill cannot be delegated    Requested Prescriptions  Pending Prescriptions Disp Refills   Vitamin D, Ergocalciferol, (DRISDOL) 1.25 MG (50000 UNIT) CAPS capsule 12 capsule 1    Sig: TAKE 1 CAPSULE BY MOUTH ONCE A WEEK FOR 12 WEEKS      Endocrinology:  Vitamins - Vitamin D Supplementation Failed - 04/13/2020  7:40 AM      Failed - 50,000 IU strengths are not delegated      Failed - Ca in normal range and within 360 days    No results found for: CALCIUM, CORRECTEDCA, CAWHOLEBLD, POCCA, CALION, CAION, POCICA        Failed - Phosphate in normal range and within 360 days    No results found for: PHOS        Failed - Vitamin D in normal range and within 360 days    No results found for: PY1950DT2, IZ1245YK9, [id number], [place], [place], [place], [place], [place], [place], 25OHVITD3, VD25OH        Failed - Valid encounter within last 12 months    Recent Outpatient Visits   None

## 2020-04-14 ENCOUNTER — Other Ambulatory Visit: Payer: Self-pay

## 2020-04-15 DIAGNOSIS — E1142 Type 2 diabetes mellitus with diabetic polyneuropathy: Secondary | ICD-10-CM | POA: Diagnosis not present

## 2020-04-15 DIAGNOSIS — B351 Tinea unguium: Secondary | ICD-10-CM | POA: Diagnosis not present

## 2020-04-15 DIAGNOSIS — M79674 Pain in right toe(s): Secondary | ICD-10-CM | POA: Diagnosis not present

## 2020-04-15 DIAGNOSIS — L601 Onycholysis: Secondary | ICD-10-CM | POA: Diagnosis not present

## 2020-04-28 ENCOUNTER — Other Ambulatory Visit: Payer: Self-pay

## 2020-04-28 MED FILL — Hydrocodone-Acetaminophen Tab 7.5-325 MG: ORAL | 30 days supply | Qty: 120 | Fill #0 | Status: AC

## 2020-05-04 ENCOUNTER — Other Ambulatory Visit: Payer: Self-pay

## 2020-05-04 MED FILL — Empagliflozin Tab 25 MG: ORAL | 90 days supply | Qty: 90 | Fill #0 | Status: CN

## 2020-05-05 ENCOUNTER — Other Ambulatory Visit: Payer: Self-pay

## 2020-05-05 MED FILL — Empagliflozin Tab 25 MG: ORAL | 90 days supply | Qty: 90 | Fill #0 | Status: AC

## 2020-05-11 ENCOUNTER — Other Ambulatory Visit: Payer: Self-pay

## 2020-05-11 MED FILL — Ezetimibe Tab 10 MG: ORAL | 90 days supply | Qty: 90 | Fill #0 | Status: AC

## 2020-05-18 ENCOUNTER — Other Ambulatory Visit: Payer: Self-pay

## 2020-05-18 MED FILL — Methotrexate Sodium Tab 2.5 MG (Base Equiv): ORAL | 28 days supply | Qty: 20 | Fill #0 | Status: AC

## 2020-05-18 MED FILL — Methocarbamol Tab 500 MG: ORAL | 30 days supply | Qty: 120 | Fill #0 | Status: AC

## 2020-05-19 ENCOUNTER — Other Ambulatory Visit: Payer: Self-pay

## 2020-05-19 ENCOUNTER — Ambulatory Visit
Payer: 59 | Attending: Student in an Organized Health Care Education/Training Program | Admitting: Student in an Organized Health Care Education/Training Program

## 2020-05-19 ENCOUNTER — Encounter: Payer: Self-pay | Admitting: Student in an Organized Health Care Education/Training Program

## 2020-05-19 VITALS — BP 133/82 | HR 82 | Temp 98.2°F | Resp 16 | Ht 70.0 in | Wt 240.0 lb

## 2020-05-19 DIAGNOSIS — G894 Chronic pain syndrome: Secondary | ICD-10-CM | POA: Diagnosis not present

## 2020-05-19 DIAGNOSIS — M5412 Radiculopathy, cervical region: Secondary | ICD-10-CM | POA: Diagnosis not present

## 2020-05-19 DIAGNOSIS — Q761 Klippel-Feil syndrome: Secondary | ICD-10-CM | POA: Insufficient documentation

## 2020-05-19 MED ORDER — PREGABALIN 50 MG PO CAPS
50.0000 mg | ORAL_CAPSULE | Freq: Every day | ORAL | 5 refills | Status: DC
  Filled 2020-05-19: qty 60, 30d supply, fill #0

## 2020-05-19 MED ORDER — HYDROCODONE-ACETAMINOPHEN 7.5-325 MG PO TABS: 1.0000 | ORAL_TABLET | Freq: Four times a day (QID) | ORAL | 0 refills | Status: DC | PRN

## 2020-05-19 MED ORDER — HYDROCODONE-ACETAMINOPHEN 7.5-325 MG PO TABS
1.0000 | ORAL_TABLET | Freq: Four times a day (QID) | ORAL | 0 refills | Status: DC | PRN
  Filled 2020-07-29: qty 120, 30d supply, fill #0

## 2020-05-19 NOTE — Progress Notes (Signed)
Nursing Pain Medication Assessment:  Safety precautions to be maintained throughout the outpatient stay will include: orient to surroundings, keep bed in low position, maintain call bell within reach at all times, provide assistance with transfer out of bed and ambulation.  Medication Inspection Compliance: Pill count conducted under aseptic conditions, in front of the patient. Neither the pills nor the bottle was removed from the patient's sight at any time. Once count was completed pills were immediately returned to the patient in their original bottle.  Medication: Hydrocodone/APAP Pill/Patch Count: 40 of 120 pills remain Pill/Patch Appearance: Markings consistent with prescribed medication Bottle Appearance: Standard pharmacy container. Clearly labeled. Filled Date: 4 / 42 / 2022 Last Medication intake:  Yesterday

## 2020-05-19 NOTE — Progress Notes (Signed)
PROVIDER NOTE: Information contained herein reflects review and annotations entered in association with encounter. Interpretation of such information and data should be left to medically-trained personnel. Information provided to patient can be located elsewhere in the medical record under "Patient Instructions". Document created using STT-dictation technology, any transcriptional errors that may result from process are unintentional.    Patient: Tony Cardenas  Service Category: E/M  Provider: Gillis Santa, MD  DOB: 1970-11-24  DOS: 05/19/2020  Specialty: Interventional Pain Management  MRN: [id number]  Setting: Ambulatory outpatient  PCP: Valerie Roys, DO  Type: Established Patient    Referring Provider: Valerie Roys, DO  Location: Office  Delivery: Face-to-face     HPI  Tony Cardenas, a 50 y.o. 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 Tony Cardenas primary complain today is Neck Pain Last encounter: My last encounter with him was on 02/20/2020. Pertinent problems: Tony Cardenas has S/P cervical spinal fusion; Cervical radicular pain; Cervical fusion syndrome; Chronic pain syndrome; and Cervical spondylosis with myelopathy and radiculopathy on their pertinent problem list. Pain Assessment: Severity of   is reported as a 7 /10. Location: Neck Right,Left/shoulders bilaterally. Onset: More than a month ago. Quality: Aching,Dull,Sharp. Timing: Constant. Modifying factor(s): meds. Vitals:  height is 5' 10"  (1.778 m) and weight is 240 lb (108.9 kg). His temperature is 98.2 F (36.8 C). His blood pressure is 133/82 and his pulse is 82. His respiration is 16 and oxygen saturation is 98%.   Reason for encounter: medication management.    Patient presents today for medication management.  No significant change in his medical history since his last clinic visit with me.  He is still recovering from his cervical spine C4 corpectomy.  He has a C3-C6  posterior spinal fusion.  He tries to work outside and tries to engage in activities that he has to do as well as well as that he finds enjoyable however limits them when he is having increased neck pain.  He utilizes hydrocodone every 6 to every 8 hours as needed.  He states that it does help with his pain.  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  He continues Lyrica and finds benefit with that.  Today we will refill his medications as below and he will follow-up in 3 months.  Pharmacotherapy Assessment   Analgesic: Hydrocodone 7.5 mg QID PRN   Monitoring: [place] PMP: PDMP reviewed during this encounter.       Pharmacotherapy: No side-effects or adverse reactions reported. Compliance: No problems identified. Effectiveness: Clinically acceptable.  Ignatius Specking, RN  05/19/2020  8:14 AM  Sign when Signing Visit Nursing Pain Medication Assessment:  Safety precautions to be maintained throughout the outpatient stay will include: orient to surroundings, keep bed in low position, maintain call bell within reach at all times, provide assistance with transfer out of bed and ambulation.  Medication Inspection Compliance: Pill count conducted under aseptic conditions, in front of the patient. Neither the pills nor the bottle was removed from the patient's sight at any time. Once count was completed pills were immediately returned to the patient in their original bottle.  Medication: Hydrocodone/APAP Pill/Patch Count: 40 of 120 pills remain Pill/Patch Appearance: Markings consistent with prescribed medication Bottle Appearance: Standard pharmacy container. Clearly labeled. Filled Date: 4 / 75 / 2022 Last Medication intake:  Yesterday    UDS:  Summary  Date Value Ref Range Status  02/20/2020 Note  Final  Comment:    ==================================================================== [employer] 13  (MW) ==================================================================== Test                             Result       Flag       Units  Drug Present and Declared for Prescription Verification   Hydrocodone                    1684         EXPECTED   ng/mg creat   Dihydrocodeine                 190          EXPECTED   ng/mg creat   Norhydrocodone                 835          EXPECTED   ng/mg creat    Sources of hydrocodone include scheduled prescription medications.    Dihydrocodeine and norhydrocodone are expected metabolites of    hydrocodone. Dihydrocodeine is also available as a scheduled    prescription medication.  ==================================================================== Test                      Result    Flag   Units      Ref Range   Creatinine              31               mg/dL      >=20 ==================================================================== Declared Medications:  The flagging and interpretation on this report are based on the  following declared medications.  Unexpected results may arise from  inaccuracies in the declared medications.   **Note: The testing scope of this panel includes these medications:   Hydrocodone (Norco)   **Note: The testing scope of this panel does not include the  following reported medications:   Acetaminophen (Norco)  Albuterol (Ventolin HFA)  Empagliflozin (Jardiance)  Ezetimibe (Zetia)  Fluoride (Prevident)  Fluticasone (Flonase)  Folic Acid  Lisinopril (Zestril)  Metformin (Glucophage)  Methocarbamol (Robaxin)  Methotrexate  Omega-3 Fatty Acids  Pregabalin (Lyrica)  Terbinafine (Lamisil)  Vitamin D2 (Drisdol) ==================================================================== For clinical consultation, please call [phone]. ====================================================================      ROS  Constitutional: Denies any fever or chills Gastrointestinal: No reported hemesis,  hematochezia, vomiting, or acute GI distress Musculoskeletal: Denies any acute onset joint swelling, redness, loss of ROM, or weakness Neurological: No reported episodes of acute onset apraxia, aphasia, dysarthria, agnosia, amnesia, paralysis, loss of coordination, or loss of consciousness  Medication Review  HYDROcodone-acetaminophen, Sodium Fluoride, Vitamin D (Ergocalciferol), albuterol, blood glucose meter kit and supplies, empagliflozin, ezetimibe, fluticasone, folic acid, lisinopril, metFORMIN, methocarbamol, methotrexate, omega-3 acid ethyl esters, pregabalin, and terbinafine  History Review  Allergy: Tony Cardenas is allergic to atorvastatin and hydromorphone hcl. Drug: Tony Cardenas  reports no history of drug use. Alcohol:  reports no history of alcohol use. Tobacco:  reports that he quit smoking about 14 months ago. His smoking use included cigarettes. He has a 10.00 pack-year smoking history. He quit smokeless tobacco use about 4 years ago.  His smokeless tobacco use included chew. Social: Tony Cardenas  reports that he quit smoking about 14 months ago. His smoking use included cigarettes. He has a 10.00 pack-year smoking history. He quit smokeless tobacco use about 4 years ago.  His smokeless tobacco  use included chew. He reports that he does not drink alcohol and does not use drugs. Medical:  has a past medical history of Arthritis, COPD (chronic obstructive pulmonary disease) ([place]), Diabetes mellitus without complication ([place]), GERD (gastroesophageal reflux disease), Hyperlipidemia, Hypertension, and Neck pain (1989). Surgical: Tony Cardenas  has a past surgical history that includes Neck surgery (1989); Knee surgery (Right); Colonoscopy (N/A, 06/02/2014); Polypectomy (06/02/2014); Appendectomy; Esophagogastroduodenoscopy (egd) with propofol (N/A, 07/29/2016); Back surgery; Anterior cervical corpectomy (N/A, 10/10/2018); Hernia repair (over 10 years ago); Anterior cervical corpectomy (N/A,  05/01/2019); and Posterior cervical fusion/foraminotomy (N/A, 05/01/2019). Family: family history includes Alcohol abuse in his father; COPD in his father; Cancer in his mother; Diabetes in his brother, father, and paternal grandmother.  Laboratory Chemistry Profile   Renal Lab Results  Component Value Date   BUN 10 12/16/2019   CREATININE 0.90 12/16/2019   BCR 11 12/16/2019   GFRAA 114 12/16/2019   GFRNONAA 99 12/16/2019     Hepatic Lab Results  Component Value Date   AST 19 02/03/2020   ALT 19 02/03/2020   ALBUMIN 4.6 12/16/2019   ALKPHOS 79 12/16/2019   HCVAB NON REACTIVE 03/24/2020     Electrolytes Lab Results  Component Value Date   NA 137 12/16/2019   K 3.9 12/16/2019   CL 100 12/16/2019   CALCIUM 9.3 12/16/2019     Bone Lab Results  Component Value Date   VD25OH 29.7 (L) 12/16/2019     Inflammation (CRP: Acute Phase) (ESR: Chronic Phase) Lab Results  Component Value Date   CRP See Scanned report in [place] 03/24/2020   ESRSEDRATE 8 11/15/2017       Note: Above Lab results reviewed.  Recent Imaging Review  DG Chest 2 View CLINICAL DATA:  History of recent COVID-19 infection  EXAM: CHEST - 2 VIEW  COMPARISON:  04/24/2017  FINDINGS: Cardiac shadow is within normal limits. Patchy airspace opacities are noted in the bases bilaterally consistent with the given clinical history. No sizable effusion is seen. No acute bony abnormality is noted. Postsurgical changes of the cervical spine are noted.  IMPRESSION: Patchy airspace opacities in the bases bilaterally consistent with the given clinical history.  Electronically Signed   By: Inez Catalina M.D.   On: 01/13/2020 16:43 Note: Reviewed        Physical Exam  General appearance: Well nourished, well developed, and well hydrated. In no apparent acute distress Mental status: Alert, oriented x 3 (person, place, & time)       Respiratory: No evidence of acute respiratory distress Eyes:  PERLA Vitals: BP 133/82   Pulse 82   Temp 98.2 F (36.8 C)   Resp 16   Ht 5' 10"  (1.778 m)   Wt 240 lb (108.9 kg)   SpO2 98%   BMI 34.44 kg/m  BMI: Estimated body mass index is 34.44 kg/m as calculated from the following:   Height as of this encounter: 5' 10"  (1.778 m).   Weight as of this encounter: 240 lb (108.9 kg). Ideal: Ideal body weight: 73 kg (160 lb 15 oz) Adjusted ideal body weight: 87.3 kg (192 lb 9 oz)  Limited range of motion with cervical extension. Upper extremity strength Neuropathic pain pattern of upper extremity, improving  Assessment   Status Diagnosis  Controlled Controlled Controlled 1. Cervical fusion syndrome   2. Cervical radicular pain   3. Chronic pain syndrome        Plan of Care   Tony Cardenas  has a current medication list which includes the following long-term medication(s): albuterol, albuterol, albuterol, ezetimibe, ezetimibe, ezetimibe, fluticasone, fluticasone, lisinopril, lisinopril, metformin, metformin, omega-3 acid ethyl esters, omega-3 acid ethyl esters, and pregabalin.  Pharmacotherapy (Medications Ordered): Meds ordered this encounter  Medications  . HYDROcodone-acetaminophen (NORCO) 7.5-325 MG tablet    Sig: Take 1 tablet by mouth every 6 (six) hours as needed for moderate pain.    Dispense:  120 tablet    Refill:  0  . HYDROcodone-acetaminophen (NORCO) 7.5-325 MG tablet    Sig: Take 1 tablet by mouth every 6 (six) hours as needed for moderate pain.    Dispense:  120 tablet    Refill:  0  . pregabalin (LYRICA) 50 MG capsule    Sig: Take 1-2 capsules (50-100 mg total) by mouth at bedtime.    Dispense:  60 capsule    Refill:  5  Follow-up plan:   Return in about 3 months (around 08/19/2020) for Medication Management, in person.   Recent Visits Date Type Provider Dept  02/20/20 Office Visit Gillis Santa, MD [facility]  Showing recent visits within past 90 days and meeting all other  requirements Today's Visits Date Type Provider Dept  05/19/20 Office Visit Gillis Santa, MD [facility]  Showing today's visits and meeting all other requirements Future Appointments No visits were found meeting these conditions. Showing future appointments within next 90 days and meeting all other requirements  I discussed the assessment and treatment plan with the patient. The patient was provided an opportunity to ask questions and all were answered. The patient agreed with the plan and demonstrated an understanding of the instructions.  Patient advised to call back or seek an in-person evaluation if the symptoms or condition worsens.  Duration of encounter: 12mnutes.  Note by: BGillis Santa MD Date: 05/19/2020; Time: 8:57 AM

## 2020-05-26 ENCOUNTER — Other Ambulatory Visit
Admission: RE | Admit: 2020-05-26 | Discharge: 2020-05-26 | Disposition: A | Discharge status: Home / Self Care | Payer: 59 | Attending: Rheumatology | Admitting: Rheumatology

## 2020-05-26 ENCOUNTER — Other Ambulatory Visit: Payer: Self-pay

## 2020-05-26 DIAGNOSIS — Z79899 Other long term (current) drug therapy: Secondary | ICD-10-CM | POA: Insufficient documentation

## 2020-05-26 DIAGNOSIS — M4712 Other spondylosis with myelopathy, cervical region: Secondary | ICD-10-CM | POA: Diagnosis not present

## 2020-05-26 DIAGNOSIS — M0609 Rheumatoid arthritis without rheumatoid factor, multiple sites: Secondary | ICD-10-CM | POA: Insufficient documentation

## 2020-05-26 DIAGNOSIS — M4722 Other spondylosis with radiculopathy, cervical region: Secondary | ICD-10-CM | POA: Diagnosis not present

## 2020-05-26 LAB — CBC WITH DIFFERENTIAL/PLATELET
Abs Immature Granulocytes: 0.07 10*3/uL (ref 0.00–0.07)
Basophils Absolute: 0.1 10*3/uL (ref 0.0–0.1)
Basophils Relative: 1 %
Eosinophils Absolute: 0.1 10*3/uL (ref 0.0–0.5)
Eosinophils Relative: 1 %
HCT: 41.8 % (ref 39.0–52.0)
Hemoglobin: 15 g/dL (ref 13.0–17.0)
Immature Granulocytes: 1 %
Lymphocytes Relative: 36 %
Lymphs Abs: 3.3 10*3/uL (ref 0.7–4.0)
MCH: 31.6 pg (ref 26.0–34.0)
MCHC: 35.9 g/dL (ref 30.0–36.0)
MCV: 88.2 fL (ref 80.0–100.0)
Monocytes Absolute: 0.5 10*3/uL (ref 0.1–1.0)
Monocytes Relative: 5 %
Neutro Abs: 5.1 10*3/uL (ref 1.7–7.7)
Neutrophils Relative %: 56 %
Platelets: 375 10*3/uL (ref 150–400)
RBC: 4.74 MIL/uL (ref 4.22–5.81)
RDW: 13.4 % (ref 11.5–15.5)
WBC: 9.2 10*3/uL (ref 4.0–10.5)
nRBC: 0 % (ref 0.0–0.2)

## 2020-05-26 LAB — C-REACTIVE PROTEIN: CRP: 0.7 mg/dL (ref <1.0)

## 2020-05-26 LAB — ALT: ALT: 28 U/L (ref 0–44)

## 2020-05-26 LAB — AST: AST: 27 U/L (ref 15–41)

## 2020-05-26 MED ORDER — METHOTREXATE 2.5 MG PO TABS
ORAL_TABLET | ORAL | 3 refills | Status: DC
  Filled 2020-05-26 – 2020-07-07 (×2): qty 20, 28d supply, fill #0
  Filled 2020-07-29: qty 20, 28d supply, fill #1
  Filled 2020-09-06: qty 20, 28d supply, fill #2

## 2020-05-28 ENCOUNTER — Telehealth: Payer: Self-pay | Admitting: Student in an Organized Health Care Education/Training Program

## 2020-05-28 ENCOUNTER — Other Ambulatory Visit: Payer: Self-pay

## 2020-05-28 ENCOUNTER — Other Ambulatory Visit: Payer: Self-pay | Admitting: Student in an Organized Health Care Education/Training Program

## 2020-05-28 DIAGNOSIS — Z981 Arthrodesis status: Secondary | ICD-10-CM | POA: Diagnosis not present

## 2020-05-28 DIAGNOSIS — M4322 Fusion of spine, cervical region: Secondary | ICD-10-CM | POA: Diagnosis not present

## 2020-05-28 DIAGNOSIS — M4692 Unspecified inflammatory spondylopathy, cervical region: Secondary | ICD-10-CM | POA: Diagnosis not present

## 2020-05-28 DIAGNOSIS — M47812 Spondylosis without myelopathy or radiculopathy, cervical region: Secondary | ICD-10-CM | POA: Diagnosis not present

## 2020-05-28 DIAGNOSIS — M47813 Spondylosis without myelopathy or radiculopathy, cervicothoracic region: Secondary | ICD-10-CM | POA: Diagnosis not present

## 2020-05-28 MED ORDER — HYDROCODONE-ACETAMINOPHEN 7.5-325 MG PO TABS
1.0000 | ORAL_TABLET | Freq: Four times a day (QID) | ORAL | 0 refills | Status: AC | PRN
  Filled 2020-06-29: qty 120, 30d supply, fill #0

## 2020-05-28 NOTE — Telephone Encounter (Signed)
I confirmed with pharmacy, there is no script for Hydrocodone for May, 2022.

## 2020-05-29 ENCOUNTER — Telehealth: Payer: Self-pay

## 2020-05-29 ENCOUNTER — Other Ambulatory Visit: Payer: Self-pay | Admitting: Student in an Organized Health Care Education/Training Program

## 2020-05-29 ENCOUNTER — Other Ambulatory Visit: Payer: Self-pay

## 2020-05-29 DIAGNOSIS — G894 Chronic pain syndrome: Secondary | ICD-10-CM

## 2020-05-29 DIAGNOSIS — Q761 Klippel-Feil syndrome: Secondary | ICD-10-CM

## 2020-05-29 DIAGNOSIS — M5412 Radiculopathy, cervical region: Secondary | ICD-10-CM

## 2020-05-29 MED ORDER — HYDROCODONE-ACETAMINOPHEN 7.5-325 MG PO TABS
1.0000 | ORAL_TABLET | Freq: Four times a day (QID) | ORAL | 0 refills | Status: AC | PRN
  Filled 2020-05-29: qty 120, 30d supply, fill #0

## 2020-05-29 NOTE — Telephone Encounter (Signed)
Pharmacy had a question about when to refill pts meds

## 2020-05-29 NOTE — Telephone Encounter (Signed)
Pt is requesting a nurse call./ States that his Rx is wrong and that he does not have any pain meds for the month of May

## 2020-05-29 NOTE — Telephone Encounter (Signed)
Dr Holley Raring resent in script and this has been taken care of.

## 2020-06-14 MED FILL — Methotrexate Sodium Tab 2.5 MG (Base Equiv): ORAL | 28 days supply | Qty: 20 | Fill #1 | Status: AC

## 2020-06-14 MED FILL — Folic Acid Tab 1 MG: ORAL | 90 days supply | Qty: 90 | Fill #0 | Status: AC

## 2020-06-14 MED FILL — Omega-3-acid Ethyl Esters Cap 1 GM: ORAL | 30 days supply | Qty: 60 | Fill #1 | Status: AC

## 2020-06-15 ENCOUNTER — Other Ambulatory Visit: Payer: Self-pay

## 2020-06-23 ENCOUNTER — Ambulatory Visit: Payer: Self-pay

## 2020-06-23 NOTE — Telephone Encounter (Signed)
Pt with 2 week h/o intermittent chest pain. Pain located to left chest. Denies radiating pain. Pt stated when the pain starts he rates it 7/10 and described it  as pressure.  Pt stated his pain was a 2/10 and that was during the call. Pt denies dizziness, nausea, vomiting, sweating, fever, difficulty breathing or cough.  Pt stated the last couple of days he used his inhaler and he stated the pressure seemed to ease up a little. The underlying pressure is constant but is 2/10.  Pt has h/o COPD and is a former smoker and a diabetic.  Care advice given and pt given understanding. Called [facility] and spoke with [place] who asked a provider and PCP triage agreed to send to ED ASAP. Pt stated his wife will be home in 45 minutes and she will take him to [facility] ED. Routing to office high priority.

## 2020-06-23 NOTE — Telephone Encounter (Signed)
Spoke with Dr Neomia Dear advised to go to ER

## 2020-06-23 NOTE — Telephone Encounter (Signed)
    Reason for Disposition  [1] Chest pain (or "angina") comes and goes AND [2] is happening more often (increasing in frequency) or getting worse (increasing in severity) (Exception: chest pains that last only a few seconds)  Answer Assessment - Initial Assessment Questions 1. LOCATION: "Where does it hurt?"       Mid to left chest 2. RADIATION: "Does the pain go anywhere else?" (e.g., into neck, jaw, arms, back)     no 3. ONSET: "When did the chest pain begin?" (Minutes, hours or days)      2-3 weeks 4. PATTERN "Does the pain come and go, or has it been constant since it started?"  "Does it get worse with exertion?"      Comes and goes 5. DURATION: "How long does it last" (e.g., seconds, minutes, hours)     All day 6. SEVERITY: "How bad is the pain?"  (e.g., Scale 1-10; mild, moderate, or severe)    - MILD (1-3): doesn't interfere with normal activities     - MODERATE (4-7): interferes with normal activities or awakens from sleep    - SEVERE (8-10): excruciating pain, unable to do any normal activities       No pain at  [place]. Goes 7-/10 - feels like pressure 7. CARDIAC RISK FACTORS: "Do you have any history of heart problems or risk factors for heart disease?" (e.g., angina, prior heart attack; diabetes, high blood pressure, high cholesterol, smoker, or strong family history of heart disease)     Diabetic, high cholesterol, former smoker 8. PULMONARY RISK FACTORS: "Do you have any history of lung disease?"  (e.g., blood clots in lung, asthma, emphysema, birth control pills)     COPD 9. CAUSE: "What do you think is causing the chest pain?"     Thinking tightness due to breathing- Used inhaler and pain eased off an hour or so- still there but 2/10 10. OTHER SYMPTOMS: "Do you have any other symptoms?" (e.g., dizziness, nausea, vomiting, sweating, fever, difficulty breathing, cough)       no  Protocols used: Chest Pain-A-AH

## 2020-06-29 ENCOUNTER — Other Ambulatory Visit: Payer: Self-pay

## 2020-06-30 ENCOUNTER — Other Ambulatory Visit: Payer: Self-pay

## 2020-06-30 ENCOUNTER — Ambulatory Visit (INDEPENDENT_AMBULATORY_CARE_PROVIDER_SITE_OTHER): Payer: 59 | Admitting: Family Medicine

## 2020-06-30 ENCOUNTER — Encounter: Payer: Self-pay | Admitting: Family Medicine

## 2020-06-30 VITALS — BP 124/70 | HR 80 | Temp 98.2°F | Ht 70.25 in | Wt 245.6 lb

## 2020-06-30 DIAGNOSIS — K219 Gastro-esophageal reflux disease without esophagitis: Secondary | ICD-10-CM

## 2020-06-30 DIAGNOSIS — I10 Essential (primary) hypertension: Secondary | ICD-10-CM

## 2020-06-30 DIAGNOSIS — Z1211 Encounter for screening for malignant neoplasm of colon: Secondary | ICD-10-CM | POA: Diagnosis not present

## 2020-06-30 DIAGNOSIS — E1165 Type 2 diabetes mellitus with hyperglycemia: Secondary | ICD-10-CM | POA: Diagnosis not present

## 2020-06-30 DIAGNOSIS — E559 Vitamin D deficiency, unspecified: Secondary | ICD-10-CM | POA: Diagnosis not present

## 2020-06-30 DIAGNOSIS — J449 Chronic obstructive pulmonary disease, unspecified: Secondary | ICD-10-CM

## 2020-06-30 DIAGNOSIS — Z0000 Encounter for general adult medical examination without abnormal findings: Secondary | ICD-10-CM

## 2020-06-30 DIAGNOSIS — E782 Mixed hyperlipidemia: Secondary | ICD-10-CM | POA: Diagnosis not present

## 2020-06-30 LAB — URINALYSIS, ROUTINE W REFLEX MICROSCOPIC
Bilirubin, UA: NEGATIVE
Ketones, UA: NEGATIVE
Leukocytes,UA: NEGATIVE
Nitrite, UA: NEGATIVE
Protein,UA: NEGATIVE
RBC, UA: NEGATIVE
Specific Gravity, UA: 1.015 (ref 1.005–1.030)
Urobilinogen, Ur: 0.2 mg/dL (ref 0.2–1.0)
pH, UA: 5 (ref 5.0–7.5)

## 2020-06-30 LAB — MICROALBUMIN, URINE WAIVED
Creatinine, Urine Waived: 100 mg/dL (ref 10–300)
Microalb, Ur Waived: 10 mg/L (ref 0–19)
Microalb/Creat Ratio: <30 mg/g (ref <30)

## 2020-06-30 LAB — BAYER DCA HB A1C WAIVED: HB A1C (BAYER DCA - WAIVED): 6.8 % (ref <7.0)

## 2020-06-30 MED ORDER — OMEGA-3-ACID ETHYL ESTERS 1 G PO CAPS
1.0000 g | ORAL_CAPSULE | Freq: Every day | ORAL | 1 refills | Status: DC
  Filled 2020-06-30 – 2020-08-10 (×2): qty 90, 90d supply, fill #0
  Filled 2020-11-15: qty 90, 90d supply, fill #1

## 2020-06-30 MED ORDER — EMPAGLIFLOZIN 25 MG PO TABS
ORAL_TABLET | Freq: Every day | ORAL | 1 refills | Status: DC
  Filled 2020-06-30: qty 90, 90d supply, fill #0

## 2020-06-30 MED ORDER — FLUTICASONE PROPIONATE 50 MCG/ACT NA SUSP
NASAL | 12 refills | Status: AC
  Filled 2020-06-30: qty 16, 30d supply, fill #0

## 2020-06-30 MED ORDER — METFORMIN HCL ER 500 MG PO TB24
ORAL_TABLET | Freq: Two times a day (BID) | ORAL | 1 refills | Status: DC
  Filled 2020-06-30 – 2020-08-10 (×2): qty 360, 90d supply, fill #0
  Filled 2020-12-10: qty 360, 90d supply, fill #1

## 2020-06-30 MED ORDER — ALBUTEROL SULFATE HFA 108 (90 BASE) MCG/ACT IN AERS
2.0000 | INHALATION_SPRAY | Freq: Four times a day (QID) | RESPIRATORY_TRACT | 3 refills | Status: DC | PRN
  Filled 2020-06-30: qty 18, 25d supply, fill #0

## 2020-06-30 MED ORDER — LISINOPRIL 5 MG PO TABS
ORAL_TABLET | Freq: Every day | ORAL | 1 refills | Status: DC
  Filled 2020-06-30: qty 90, 90d supply, fill #0
  Filled 2020-10-11 – 2020-10-12 (×2): qty 90, 90d supply, fill #1

## 2020-06-30 MED ORDER — EZETIMIBE 10 MG PO TABS
ORAL_TABLET | Freq: Every day | ORAL | 1 refills | Status: DC
  Filled 2020-06-30 – 2020-08-10 (×2): qty 90, 90d supply, fill #0
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
## 2020-06-30 NOTE — Assessment & Plan Note (Signed)
Rechecking labs today. Await results. Treat as needed.  °

## 2020-06-30 NOTE — Assessment & Plan Note (Signed)
Under good control on current regimen. Continue current regimen. Continue to monitor. Call with any concerns. Refills given. Labs drawn today.   

## 2020-06-30 NOTE — Assessment & Plan Note (Signed)
Doing with a1c of 6.8- continue current regimen. Continue to monitor. Recheck 6 months. Call with any concerns. Refills given today.

## 2020-07-01 LAB — COMPREHENSIVE METABOLIC PANEL
ALT: 29 IU/L (ref 0–44)
AST: 24 IU/L (ref 0–40)
Albumin/Globulin Ratio: 2.1 (ref 1.2–2.2)
Albumin: 4.5 g/dL (ref 3.8–4.9)
Alkaline Phosphatase: 65 IU/L (ref 44–121)
BUN/Creatinine Ratio: 11 (ref 9–20)
BUN: 9 mg/dL (ref 6–24)
Bilirubin Total: 0.2 mg/dL (ref 0.0–1.2)
CO2: 19 mmol/L — ABNORMAL LOW (ref 20–29)
Calcium: 9 mg/dL (ref 8.7–10.2)
Chloride: 98 mmol/L (ref 96–106)
Creatinine, Ser: 0.8 mg/dL (ref 0.76–1.27)
Globulin, Total: 2.1 g/dL (ref 1.5–4.5)
Glucose: 208 mg/dL — ABNORMAL HIGH (ref 65–99)
Potassium: 3.7 mmol/L (ref 3.5–5.2)
Sodium: 139 mmol/L (ref 134–144)
Total Protein: 6.6 g/dL (ref 6.0–8.5)
eGFR: 107 mL/min/{1.73_m2} (ref >59)

## 2020-07-01 LAB — CBC WITH DIFFERENTIAL/PLATELET
Basophils Absolute: 0.1 10*3/uL (ref 0.0–0.2)
Basos: 1 %
EOS (ABSOLUTE): 0.1 10*3/uL (ref 0.0–0.4)
Eos: 2 %
Hematocrit: 40.5 % (ref 37.5–51.0)
Hemoglobin: 14 g/dL (ref 13.0–17.7)
Immature Grans (Abs): 0.1 10*3/uL (ref 0.0–0.1)
Immature Granulocytes: 1 %
Lymphocytes Absolute: 2.6 10*3/uL (ref 0.7–3.1)
Lymphs: 35 %
MCH: 32.8 pg (ref 26.6–33.0)
MCHC: 34.6 g/dL (ref 31.5–35.7)
MCV: 95 fL (ref 79–97)
Monocytes Absolute: 0.4 10*3/uL (ref 0.1–0.9)
Monocytes: 5 %
Neutrophils Absolute: 4.1 10*3/uL (ref 1.4–7.0)
Neutrophils: 56 %
Platelets: 346 10*3/uL (ref 150–450)
RBC: 4.27 x10E6/uL (ref 4.14–5.80)
RDW: 13.6 % (ref 11.6–15.4)
WBC: 7.4 10*3/uL (ref 3.4–10.8)

## 2020-07-01 LAB — VITAMIN D 25 HYDROXY (VIT D DEFICIENCY, FRACTURES): Vit D, 25-Hydroxy: 31.4 ng/mL (ref 30.0–100.0)

## 2020-07-01 LAB — LIPID PANEL W/O CHOL/HDL RATIO
Cholesterol, Total: 202 mg/dL — ABNORMAL HIGH (ref 100–199)
HDL: 30 mg/dL — ABNORMAL LOW (ref >39)
Triglycerides: 855 mg/dL (ref 0–149)

## 2020-07-01 LAB — PSA: Prostate Specific Ag, Serum: 0.3 ng/mL (ref 0.0–4.0)

## 2020-07-01 LAB — TSH: TSH: 0.661 u[IU]/mL (ref 0.450–4.500)

## 2020-07-05 ENCOUNTER — Other Ambulatory Visit: Payer: Self-pay

## 2020-07-06 ENCOUNTER — Other Ambulatory Visit: Payer: Self-pay

## 2020-07-07 ENCOUNTER — Other Ambulatory Visit: Payer: Self-pay

## 2020-07-08 ENCOUNTER — Encounter: Payer: Self-pay | Admitting: *Deleted

## 2020-07-09 ENCOUNTER — Other Ambulatory Visit: Payer: Self-pay

## 2020-07-28 ENCOUNTER — Other Ambulatory Visit: Payer: Self-pay

## 2020-07-29 ENCOUNTER — Other Ambulatory Visit: Payer: Self-pay | Admitting: Family Medicine

## 2020-07-29 ENCOUNTER — Other Ambulatory Visit: Payer: Self-pay

## 2020-07-29 NOTE — Telephone Encounter (Signed)
  Notes to clinic:  Requested medication not on current  list    Requested Prescriptions  Pending Prescriptions Disp Refills   methocarbamol (ROBAXIN) 500 MG tablet 180 tablet 1    Sig: TAKE 1 TABLET BY MOUTH 4 TIMES DAILY.      Not Delegated - Analgesics:  Muscle Relaxants Failed - 07/29/2020  8:49 AM      Failed - This refill cannot be delegated      Passed - Valid encounter within last 6 months    Recent Outpatient Visits           4 weeks ago Routine general medical examination at a health care facility   [facility] [place], [place] P, DO   4 months ago Type 2 diabetes mellitus with hyperglycemia, without long-term current use of insulin ([place])   [facility] [place], Megan P, DO   6 months ago COVID-19   [employer], [place], DO   7 months ago Primary hypertension   [place], Megan P, DO   10 months ago Type 2 diabetes mellitus with hyperglycemia, without long-term current use of insulin [facility])   [place], [place], DO       Future Appointments             In 5 months Johnson, Barb Merino, DO [employer]

## 2020-07-30 ENCOUNTER — Other Ambulatory Visit: Payer: Self-pay

## 2020-07-30 NOTE — Telephone Encounter (Signed)
Pt is scheduled 73/57 possible duplicate

## 2020-08-03 ENCOUNTER — Other Ambulatory Visit: Payer: Self-pay

## 2020-08-03 MED FILL — Methocarbamol Tab 500 MG: ORAL | 45 days supply | Qty: 180 | Fill #0 | Status: AC

## 2020-08-05 ENCOUNTER — Other Ambulatory Visit: Payer: Self-pay

## 2020-08-10 ENCOUNTER — Other Ambulatory Visit: Payer: Self-pay

## 2020-08-11 ENCOUNTER — Other Ambulatory Visit: Payer: Self-pay

## 2020-08-17 ENCOUNTER — Other Ambulatory Visit: Payer: Self-pay

## 2020-08-18 ENCOUNTER — Other Ambulatory Visit: Payer: Self-pay

## 2020-08-18 ENCOUNTER — Ambulatory Visit
Payer: 59 | Attending: Student in an Organized Health Care Education/Training Program | Admitting: Student in an Organized Health Care Education/Training Program

## 2020-08-18 ENCOUNTER — Encounter: Payer: Self-pay | Admitting: Student in an Organized Health Care Education/Training Program

## 2020-08-18 DIAGNOSIS — G894 Chronic pain syndrome: Secondary | ICD-10-CM | POA: Insufficient documentation

## 2020-08-18 DIAGNOSIS — M5412 Radiculopathy, cervical region: Secondary | ICD-10-CM | POA: Insufficient documentation

## 2020-08-18 DIAGNOSIS — Q761 Klippel-Feil syndrome: Secondary | ICD-10-CM | POA: Diagnosis not present

## 2020-08-18 MED ORDER — HYDROCODONE-ACETAMINOPHEN 7.5-325 MG PO TABS
1.0000 | ORAL_TABLET | Freq: Four times a day (QID) | ORAL | 0 refills | Status: AC | PRN
  Filled 2020-09-28: qty 120, 30d supply, fill #0

## 2020-08-18 MED ORDER — HYDROCODONE-ACETAMINOPHEN 7.5-325 MG PO TABS
1.0000 | ORAL_TABLET | Freq: Four times a day (QID) | ORAL | 0 refills | Status: DC | PRN
  Filled 2020-10-28: qty 120, 30d supply, fill #0

## 2020-08-18 MED ORDER — HYDROCODONE-ACETAMINOPHEN 7.5-325 MG PO TABS
1.0000 | ORAL_TABLET | Freq: Four times a day (QID) | ORAL | 0 refills | Status: AC | PRN
  Filled 2020-08-28: qty 120, 30d supply, fill #0

## 2020-08-18 MED ORDER — PREGABALIN 50 MG PO CAPS
50.0000 mg | ORAL_CAPSULE | Freq: Every day | ORAL | 5 refills | Status: DC
Start: 2020-08-18 — End: 2020-11-17
  Filled 2020-08-18: qty 60, 30d supply, fill #0

## 2020-08-18 MED ORDER — METHOCARBAMOL 500 MG PO TABS
500.0000 mg | ORAL_TABLET | Freq: Three times a day (TID) | ORAL | 2 refills | Status: DC | PRN
  Filled 2020-08-18 – 2021-07-14 (×2): qty 90, 30d supply, fill #0

## 2020-08-18 NOTE — Progress Notes (Signed)
PROVIDER NOTE: Information contained herein reflects review and annotations entered in association with encounter. Interpretation of such information and data should be left to medically-trained personnel. Information provided to patient can be located elsewhere in the medical record under "Patient Instructions". Document created using STT-dictation technology, any transcriptional errors that may result from process are unintentional.    Patient: Tony Cardenas  Service Category: E/M  Provider: Gillis Santa, MD  DOB: 03-Aug-1970  DOS: 08/18/2020  Specialty: Interventional Pain Management  MRN: [id number]  Setting: Ambulatory outpatient  PCP: Valerie Roys, DO  Type: Established Patient    Referring Provider: Valerie Roys, DO  Location: Office  Delivery: Face-to-face     HPI  [name], a 50 y.o. year old male, is here today because of his No primary diagnosis found.. [name] primary complain today is Neck Pain Last encounter: My last encounter with him was on 05/19/2020. Pertinent problems: [name] has S/P cervical spinal fusion; Cervical radicular pain; Cervical fusion syndrome; Chronic pain syndrome; and Cervical spondylosis with myelopathy and radiculopathy on their pertinent problem list. Pain Assessment: Severity of Chronic pain is reported as a 8 /10. Location: Neck Lower/Denies. Onset: More than a month ago. Quality: Aching, Dull, Throbbing, Discomfort, Constant. Timing: Constant. Modifying factor(s): laying and meds. Vitals:  height is 5' 11"  (1.803 m) and weight is 235 lb (106.6 kg). His temperature is 98.2 F (36.8 C). His blood pressure is 126/79 and his pulse is 79. His oxygen saturation is 98%.   Reason for encounter: medication management.    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  He has a C3-C6 posterior spinal fusion.   He utilizes hydrocodone every 6 to every 8 hours as needed.  Continues Lyrica as well.   Pharmacotherapy Assessment  Analgesic: Hydrocodone 7.5 mg QID PRN   Monitoring: [place] PMP: PDMP reviewed during this encounter.       Pharmacotherapy: No side-effects or adverse reactions reported. Compliance: No problems identified. Effectiveness: Clinically acceptable.  UDS:  Summary  Date Value Ref Range Status  02/20/2020 Note  Final    Comment:    ==================================================================== [employer] [facility] (MW) ==================================================================== Test                             Result       Flag       Units  Drug Present and Declared for Prescription Verification   Hydrocodone                    1684         EXPECTED   ng/mg creat   Dihydrocodeine                 190          EXPECTED   ng/mg creat   Norhydrocodone                 835          EXPECTED   ng/mg creat    Sources of hydrocodone include scheduled prescription medications.    Dihydrocodeine and norhydrocodone are expected metabolites of    hydrocodone. Dihydrocodeine is also available as a scheduled  prescription medication.  ==================================================================== Test                      Result    Flag   Units      Ref Range   Creatinine              31               mg/dL      >=20 ==================================================================== Declared Medications:  The flagging and interpretation on this report are based on the  following declared medications.  Unexpected results may arise from  inaccuracies in the declared medications.   **Note: The testing scope of this panel includes these medications:   Hydrocodone (Norco)   **Note: The testing scope of this panel does not include the  following reported  medications:   Acetaminophen (Norco)  Albuterol (Ventolin HFA)  Empagliflozin (Jardiance)  Ezetimibe (Zetia)  Fluoride (Prevident)  Fluticasone (Flonase)  Folic Acid  Lisinopril (Zestril)  Metformin (Glucophage)  Methocarbamol (Robaxin)  Methotrexate  Omega-3 Fatty Acids  Pregabalin (Lyrica)  Terbinafine (Lamisil)  Vitamin D2 (Drisdol) ==================================================================== For clinical consultation, please call [phone]. ====================================================================       ROS  Constitutional: Denies any fever or chills Gastrointestinal: No reported hemesis, hematochezia, vomiting, or acute GI distress Musculoskeletal:  mild neck pain Neurological: No reported episodes of acute onset apraxia, aphasia, dysarthria, agnosia, amnesia, paralysis, loss of coordination, or loss of consciousness  Medication Review  HYDROcodone-acetaminophen, Sodium Fluoride, Vitamin D (Ergocalciferol), albuterol, blood glucose meter kit and supplies, empagliflozin, ezetimibe, fluticasone, folic acid, lisinopril, metFORMIN, methocarbamol, methotrexate, omega-3 acid ethyl esters, pregabalin, and terbinafine  History Review  Allergy: [name] is allergic to atorvastatin and hydromorphone hcl. Drug: [name]  reports no history of drug use. Alcohol:  reports no history of alcohol use. Tobacco:  reports that he quit smoking about 17 months ago. His smoking use included cigarettes. He has a 10.00 pack-year smoking history. He quit smokeless tobacco use about 4 years ago.  His smokeless tobacco use included chew. Social: [name]  reports that he quit smoking about 17 months ago. His smoking use included cigarettes. He has a 10.00 pack-year smoking history. He quit smokeless tobacco use about 4 years ago.  His smokeless tobacco use included chew. He reports that he does not drink alcohol and does not use drugs. Medical:  has a past medical  history of Arthritis, COPD (chronic obstructive pulmonary disease) ([place]), Diabetes mellitus without complication ([place]), GERD (gastroesophageal reflux disease), Hyperlipidemia, Hypertension, and Neck pain (1989). Surgical: [name]  has a past surgical history that includes Neck surgery (1989); Knee surgery (Right); Colonoscopy (N/A, 06/02/2014); Polypectomy (06/02/2014); Appendectomy; Esophagogastroduodenoscopy (egd) with propofol (N/A, 07/29/2016); Back surgery; Anterior cervical corpectomy (N/A, 10/10/2018); Hernia repair (over 10 years ago); Anterior cervical corpectomy (N/A, 05/01/2019); and Posterior cervical fusion/foraminotomy (N/A, 05/01/2019). Family: family history includes Alcohol abuse in his father; COPD in his father; Cancer in his mother; Diabetes in his brother, father, and paternal grandmother.  Laboratory Chemistry Profile   Renal Lab Results  Component Value Date   BUN 9 06/30/2020   CREATININE 0.80 06/30/2020   BCR 11 06/30/2020   GFRAA 114 12/16/2019   GFRNONAA 99 12/16/2019     Hepatic Lab Results  Component Value Date   AST 24 06/30/2020   ALT 29 06/30/2020   ALBUMIN 4.5 06/30/2020   ALKPHOS 65 06/30/2020   HCVAB NON REACTIVE 03/24/2020     Electrolytes Lab Results  Component Value Date   NA 139 06/30/2020   K 3.7 06/30/2020   CL 98 06/30/2020   CALCIUM 9.0 06/30/2020     Bone Lab Results  Component Value Date   VD25OH 31.4 06/30/2020     Inflammation (CRP: Acute Phase) (ESR: Chronic Phase) Lab Results  Component Value Date   CRP 0.7 05/26/2020   ESRSEDRATE 8 11/15/2017       Note: Above Lab results reviewed.  Recent Imaging Review  DG Chest 2 View CLINICAL DATA:  History of recent COVID-19 infection  EXAM: CHEST - 2 VIEW  COMPARISON:  04/24/2017  FINDINGS: Cardiac shadow is within normal limits. Patchy airspace opacities are noted in the bases bilaterally consistent with the given clinical history. No sizable effusion is seen. No acute  bony abnormality is noted. Postsurgical changes of the cervical spine are noted.  IMPRESSION: Patchy airspace opacities in the bases bilaterally consistent with the given clinical history.  Electronically Signed   By: Inez Catalina M.D.   On: 01/13/2020 16:43 Note: Reviewed        Physical Exam  General appearance: Well nourished, well developed, and well hydrated. In no apparent acute distress Mental status: Alert, oriented x 3 (person, place, & time)       Respiratory: No evidence of acute respiratory distress Eyes: PERLA Vitals: BP 126/79   Pulse 79   Temp 98.2 F (36.8 C)   Ht 5' 11"  (1.803 m)   Wt 235 lb (106.6 kg)   SpO2 98%   BMI 32.78 kg/m  BMI: Estimated body mass index is 32.78 kg/m as calculated from the following:   Height as of this encounter: 5' 11"  (1.803 m).   Weight as of this encounter: 235 lb (106.6 kg). Ideal: Ideal body weight: 75.3 kg (166 lb 0.1 oz) Adjusted ideal body weight: 87.8 kg (193 lb 9.7 oz)  Limited range of motion with cervical extension. Upper extremity strength Neuropathic pain pattern of upper extremity, improving  Assessment   Status Diagnosis  Controlled Controlled Controlled 1. Cervical fusion syndrome   2. Cervical radicular pain   3. Chronic pain syndrome         Plan of Care   Tony Cardenas has a current medication list which includes the following long-term medication(s): albuterol, ezetimibe, fluticasone, lisinopril, metformin, omega-3 acid ethyl esters, and pregabalin.  Pharmacotherapy (Medications Ordered): Meds ordered this encounter  Medications   HYDROcodone-acetaminophen (NORCO) 7.5-325 MG tablet    Sig: Take 1 tablet by mouth every 6 (six) hours as needed for moderate pain.    Dispense:  120 tablet    Refill:  0   HYDROcodone-acetaminophen (NORCO) 7.5-325 MG tablet    Sig: Take 1 tablet by mouth every 6 (six) hours as needed for moderate pain.    Dispense:  120 tablet    Refill:  0    HYDROcodone-acetaminophen (NORCO) 7.5-325 MG tablet    Sig: Take 1 tablet by mouth every 6 (six) hours as needed for moderate pain.    Dispense:  120 tablet    Refill:  0   pregabalin (LYRICA) 50 MG capsule    Sig: Take 1-2 capsules (50-100 mg total) by mouth at bedtime.    Dispense:  60 capsule    Refill:  5   methocarbamol (ROBAXIN) 500 MG tablet    Sig: Take 1 tablet (500 mg total) by mouth every 8 (eight) hours as needed for muscle spasms.    Dispense:  90 tablet  Refill:  2    Do not place this medication, or any other prescription from our practice, on "Automatic Refill". Patient may have prescription filled one day early if pharmacy is closed on scheduled refill date.   Commended patient on returning to work.  He states that this has helped improve his mood as well.  He is able to work at his own pace as he states he has a very understanding and flexible employer.  Follow-up plan:   Return in about 3 months (around 11/18/2020) for Medication Management, in person.   Recent Visits No visits were found meeting these conditions. Showing recent visits within past 90 days and meeting all other requirements Today's Visits Date Type Provider Dept  08/18/20 Office Visit Gillis Santa, MD [facility]  Showing today's visits and meeting all other requirements Future Appointments No visits were found meeting these conditions. Showing future appointments within next 90 days and meeting all other requirements I discussed the assessment and treatment plan with the patient. The patient was provided an opportunity to ask questions and all were answered. The patient agreed with the plan and demonstrated an understanding of the instructions.  Patient advised to call back or seek an in-person evaluation if the symptoms or condition worsens.  Duration of encounter: 66mnutes.  Note by: BGillis Santa MD Date: 08/18/2020; Time: 8:48 AM

## 2020-08-18 NOTE — Progress Notes (Signed)
Nursing Pain Medication Assessment:  Safety precautions to be maintained throughout the outpatient stay will include: orient to surroundings, keep bed in low position, maintain call bell within reach at all times, provide assistance with transfer out of bed and ambulation.  Medication Inspection Compliance: Pill count conducted under aseptic conditions, in front of the patient. Neither the pills nor the bottle was removed from the patient's sight at any time. Once count was completed pills were immediately returned to the patient in their original bottle.  Medication: Hydrocodone/APAP Pill/Patch Count:  42 of 120 pills remain Pill/Patch Appearance: Markings consistent with prescribed medication Bottle Appearance: Standard pharmacy container. Clearly labeled. Filled Date: 7 / 20 / 2022 Last Medication intake:  Today Safety precautions to be maintained throughout the outpatient stay will include: orient to surroundings, keep bed in low position, maintain call bell within reach at all times, provide assistance with transfer out of bed and ambulation.

## 2020-08-28 ENCOUNTER — Other Ambulatory Visit: Payer: Self-pay

## 2020-09-06 MED FILL — Folic Acid Tab 1 MG: ORAL | 90 days supply | Qty: 90 | Fill #1 | Status: AC

## 2020-09-07 ENCOUNTER — Other Ambulatory Visit: Payer: Self-pay

## 2020-09-21 ENCOUNTER — Other Ambulatory Visit: Payer: Self-pay

## 2020-09-21 MED ORDER — AMOXICILLIN-POT CLAVULANATE 500-125 MG PO TABS
1.0000 | ORAL_TABLET | Freq: Three times a day (TID) | ORAL | 0 refills | Status: DC
  Filled 2020-09-21: qty 21, 7d supply, fill #0

## 2020-09-22 ENCOUNTER — Other Ambulatory Visit: Payer: Self-pay

## 2020-09-28 ENCOUNTER — Other Ambulatory Visit: Payer: Self-pay | Admitting: Family Medicine

## 2020-09-28 ENCOUNTER — Other Ambulatory Visit: Payer: Self-pay

## 2020-09-28 DIAGNOSIS — Q761 Klippel-Feil syndrome: Secondary | ICD-10-CM | POA: Diagnosis not present

## 2020-09-28 DIAGNOSIS — M0609 Rheumatoid arthritis without rheumatoid factor, multiple sites: Secondary | ICD-10-CM | POA: Diagnosis not present

## 2020-09-28 DIAGNOSIS — Z79899 Other long term (current) drug therapy: Secondary | ICD-10-CM | POA: Diagnosis not present

## 2020-09-28 IMAGING — RF DG C-ARM 1-60 MIN
1 series · 5 of 5 positions shown · non-contrast
Comparison: None.

CLINICAL DATA: Fluoroscopy provided for cervical spine fusion.

EXAM:
CERVICAL SPINE - 2-3 VIEW; DG C-ARM 1-60 MIN; DG C-ARM 1-60 MIN-NO
REPORT

[Series 1: dg x-ray · 0.20mm/px · 5 of 5 slices shown]
[im 1/5]
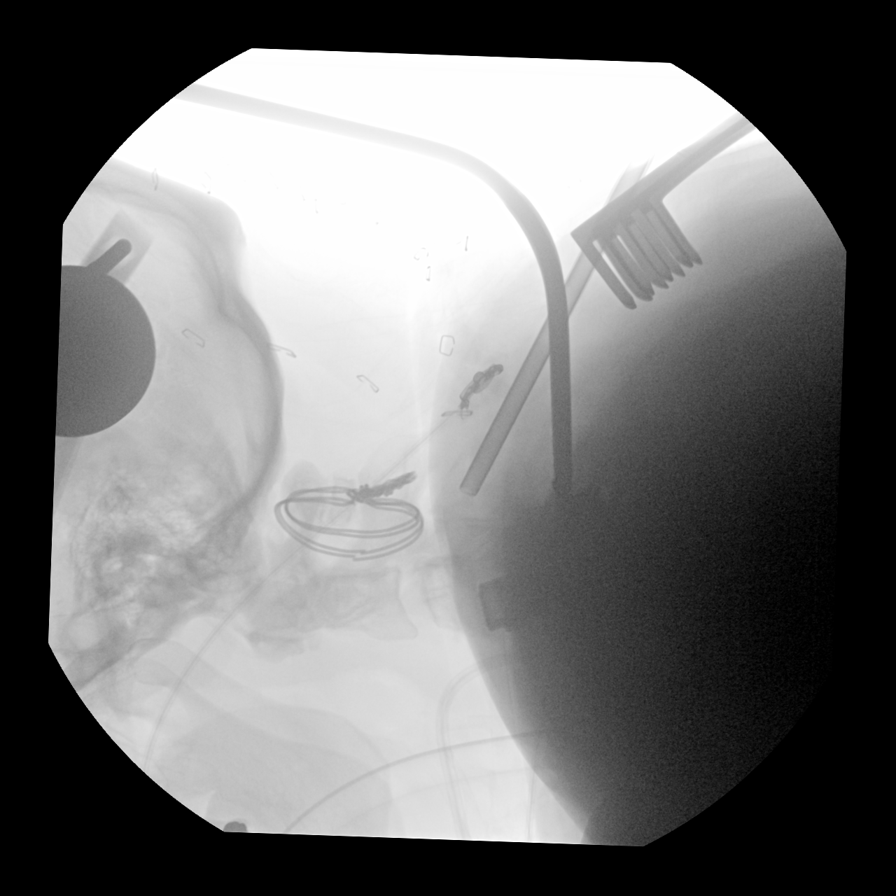
[im 2/5]
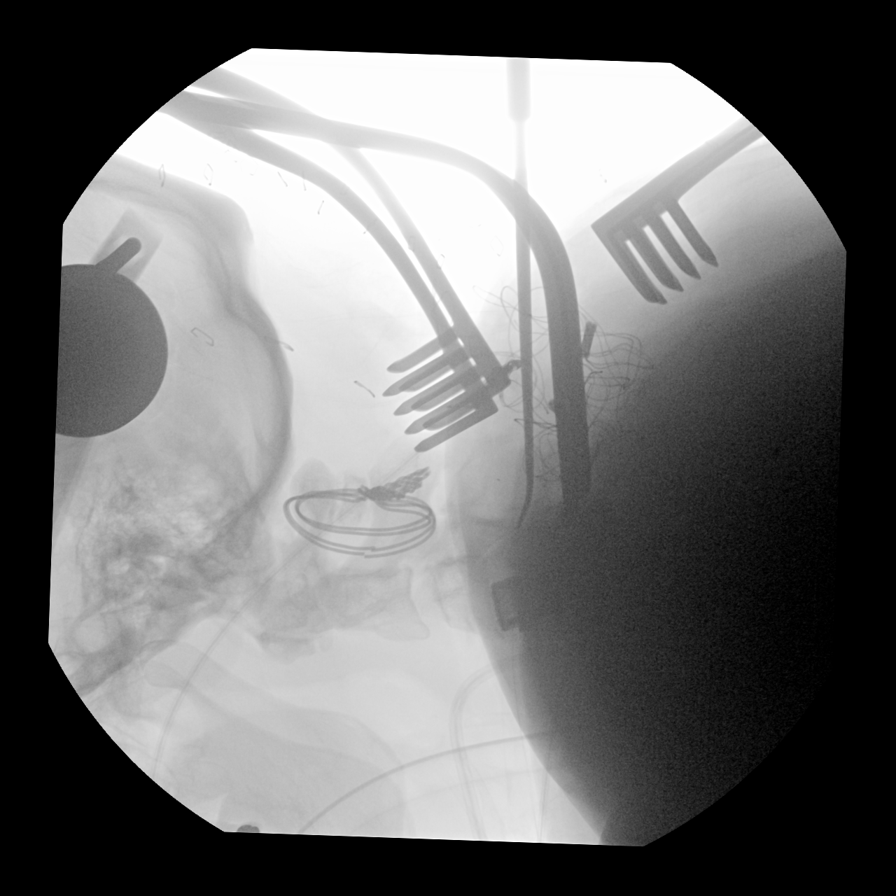
[im 3/5]
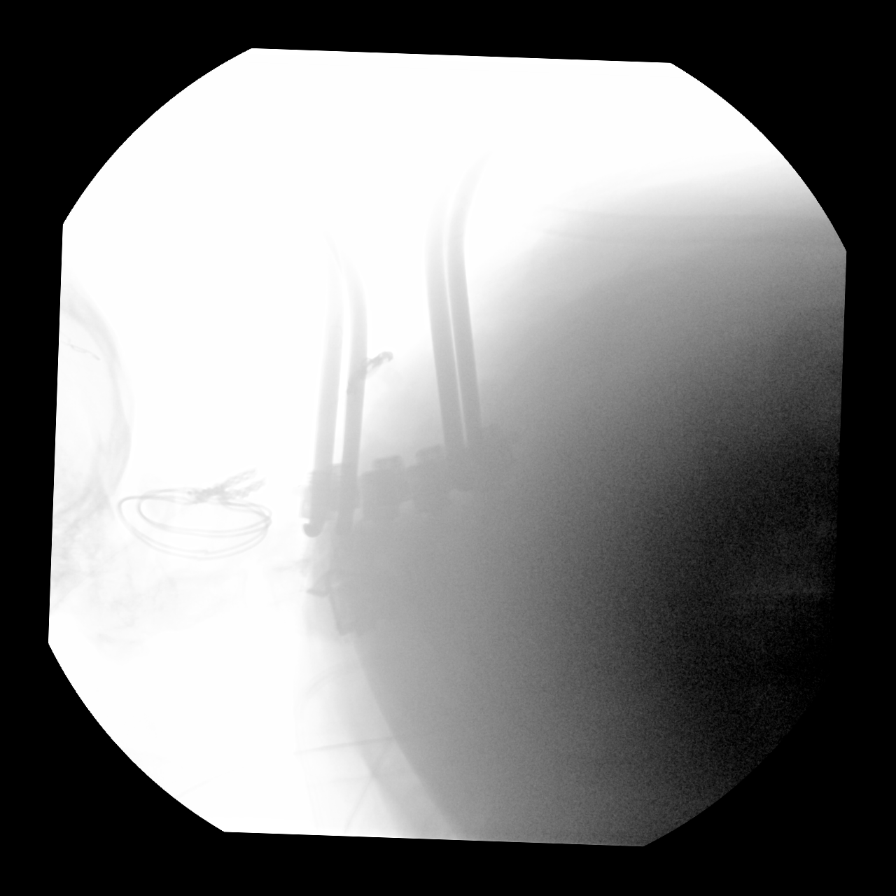
[im 4/5]
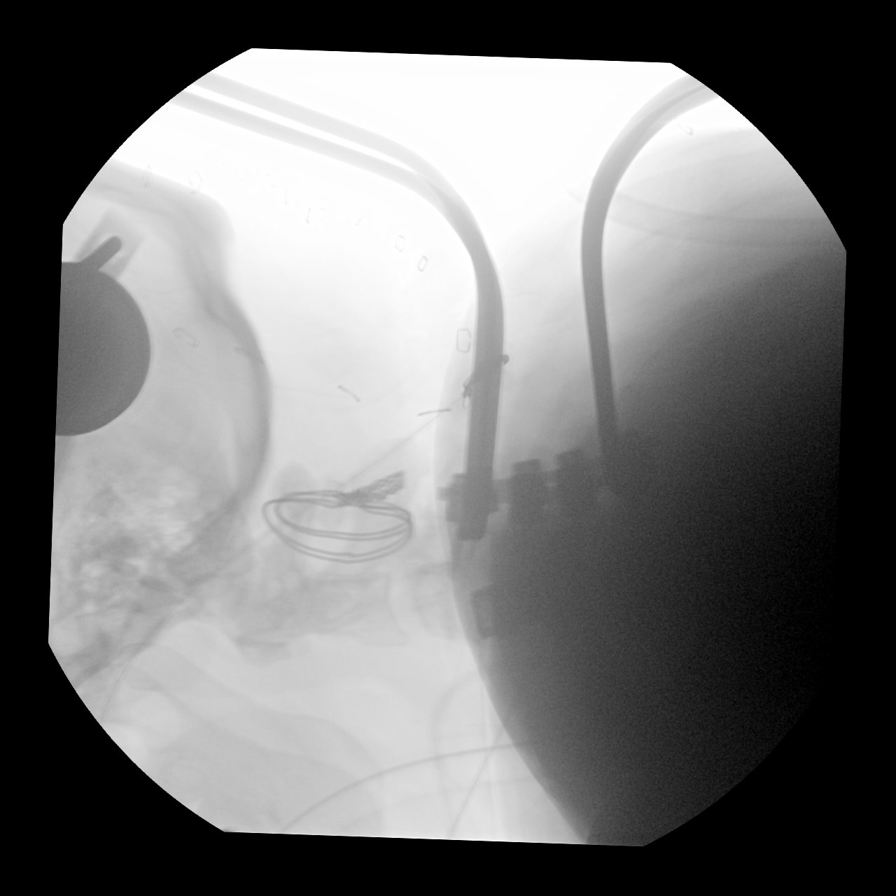
[im 5/5]
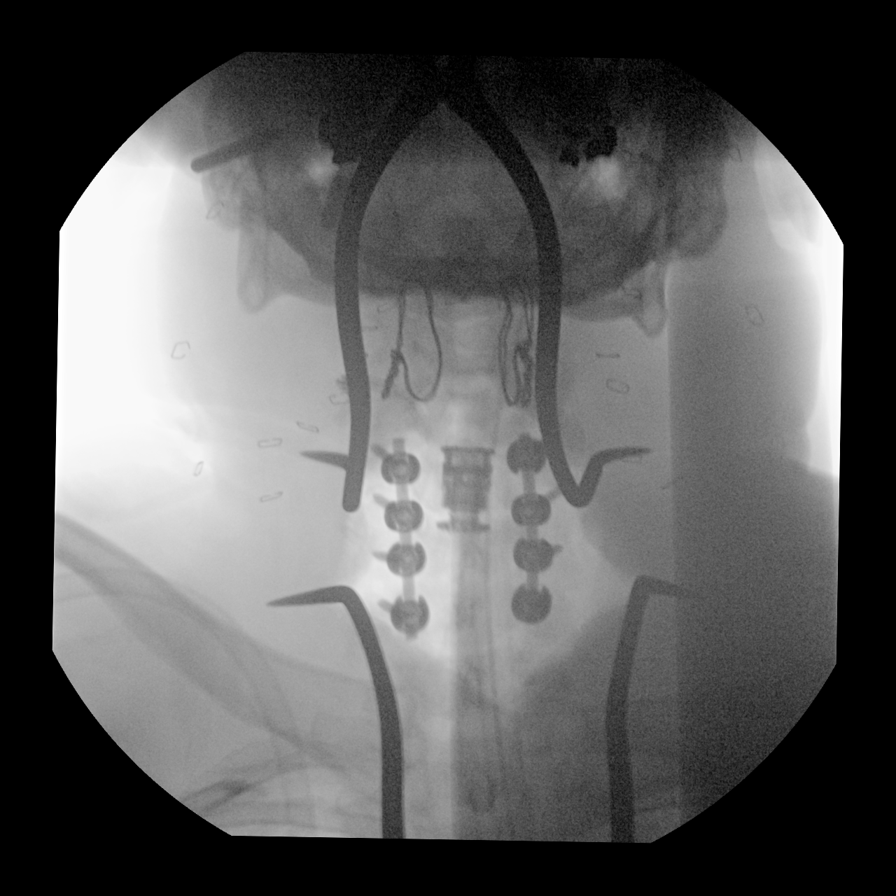

[5 of 5 positions shown; findings below may reference images not displayed]

FINDINGS: Five portable images were submitted. Images show placement posterior
fusion hardware from C3 to C6. The orthopedic hardware appears well
positioned.

Previous interbody fusions spans C3-C5.
IMPRESSION: Operative imaging provided for posterior cervical spine fusion.

## 2020-09-28 IMAGING — RF DG C-ARM 1-60 MIN
1 series · 5 of 5 positions shown · non-contrast
Comparison: None.

CLINICAL DATA: Fluoroscopy provided for cervical spine fusion.

EXAM:
CERVICAL SPINE - 2-3 VIEW; DG C-ARM 1-60 MIN; DG C-ARM 1-60 MIN-NO
REPORT

[Series 1: dg x-ray · 0.20mm/px · 5 of 5 slices shown]
[im 1/5]
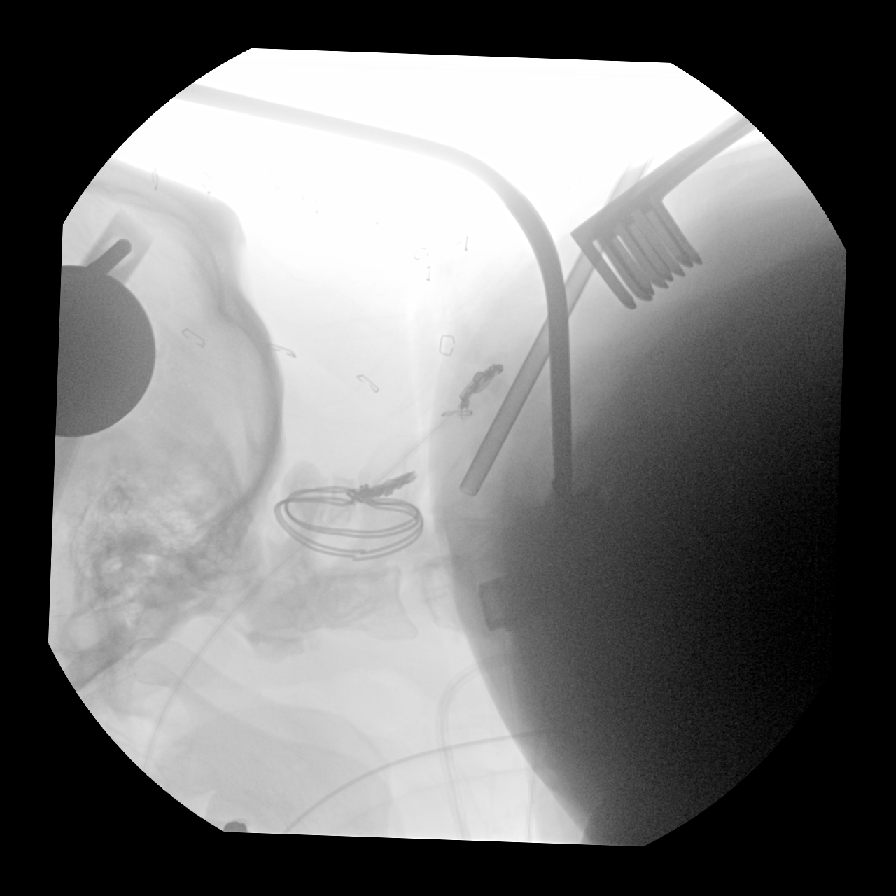
[im 2/5]
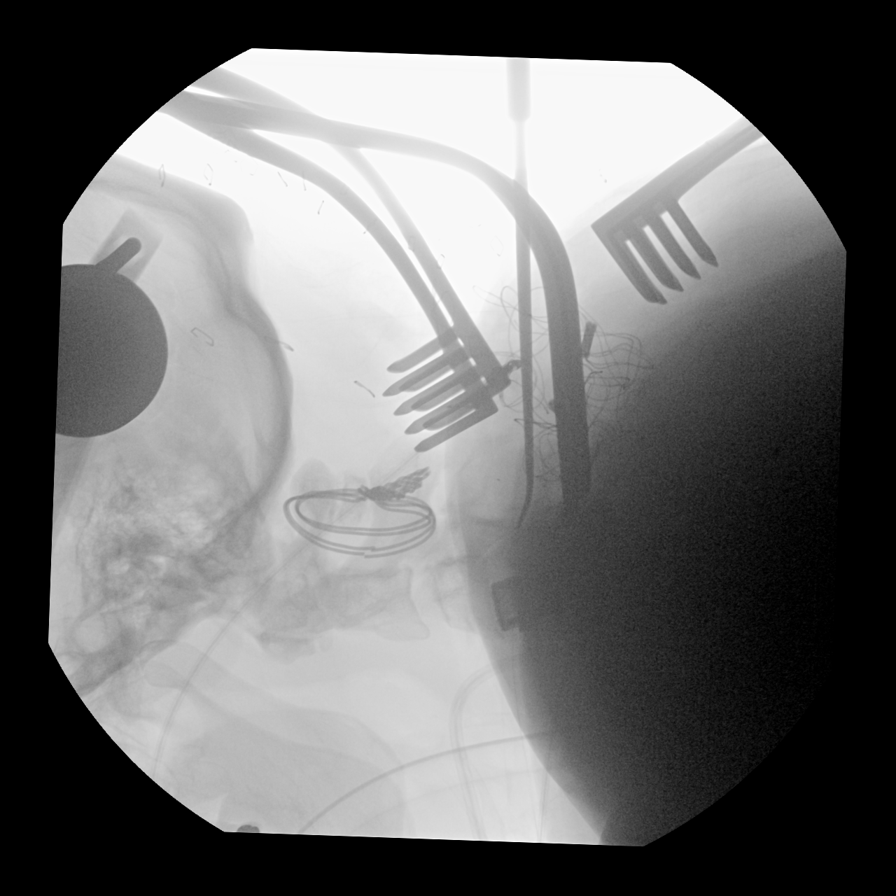
[im 3/5]
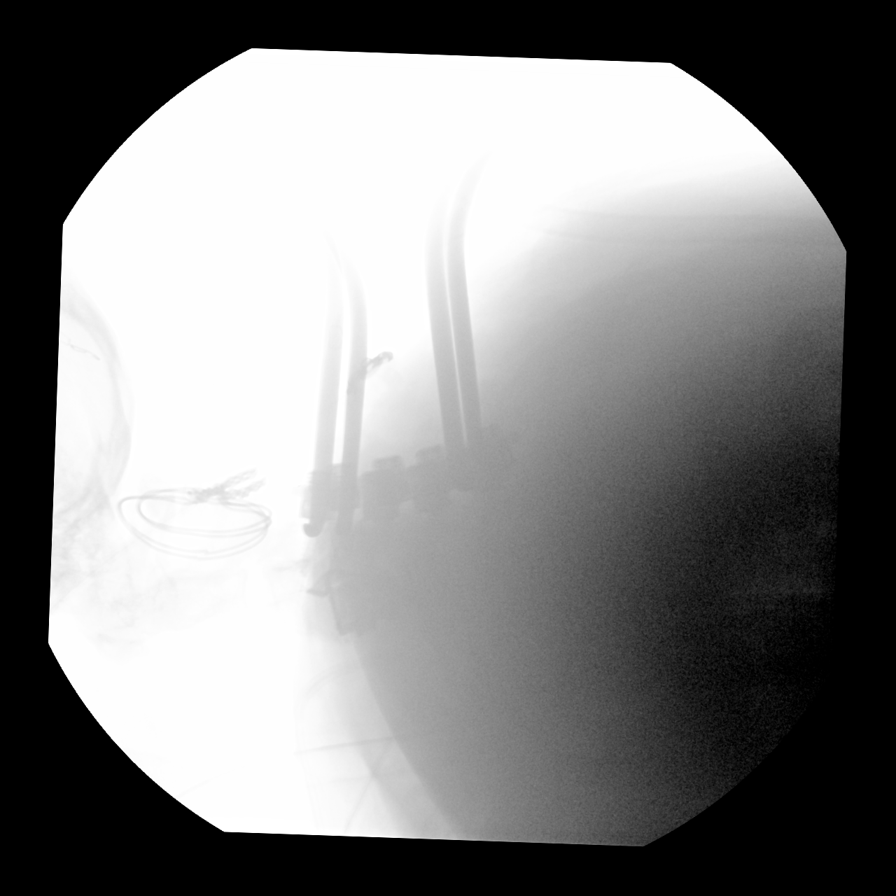
[im 4/5]
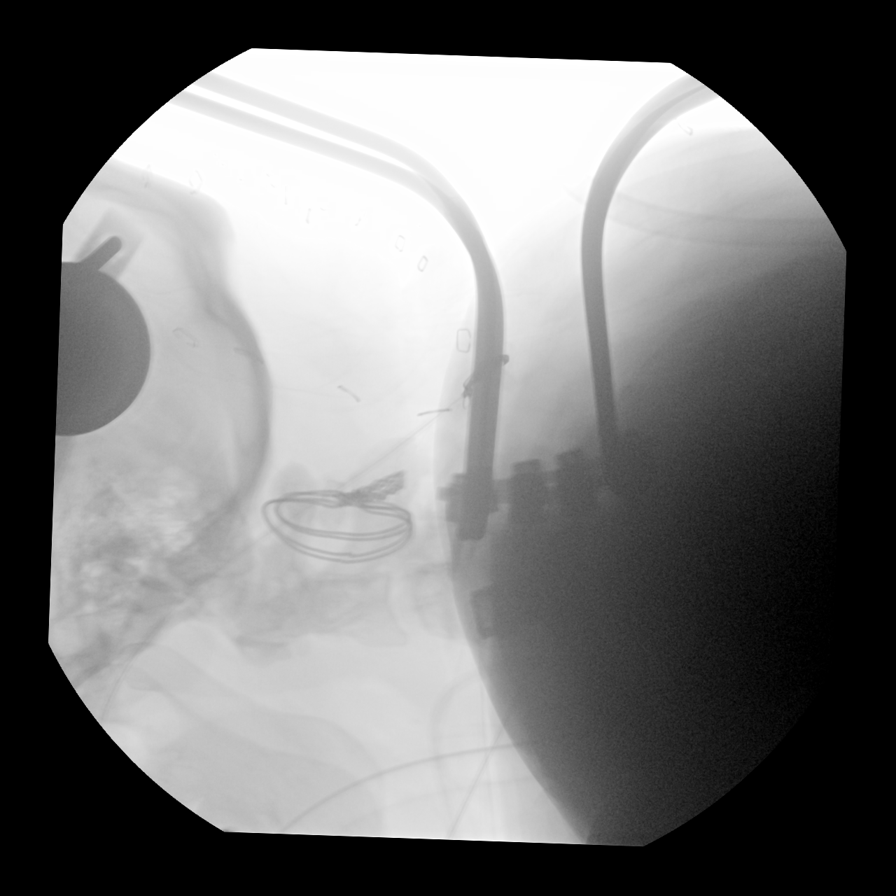
[im 5/5]
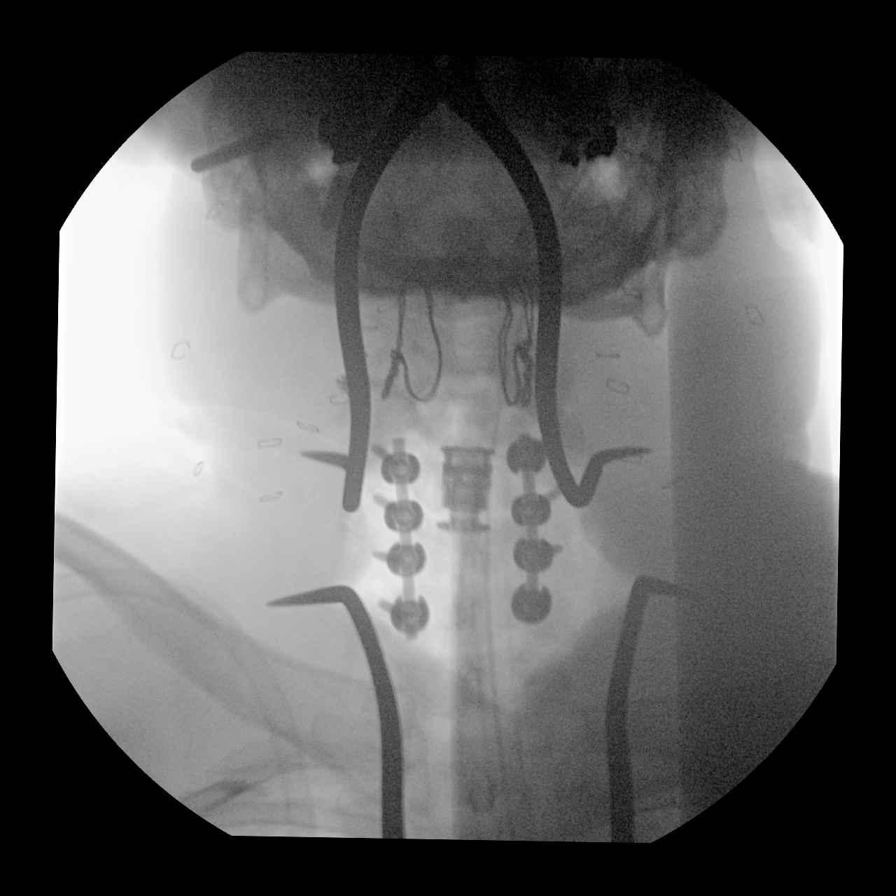

[5 of 5 positions shown; findings below may reference images not displayed]

FINDINGS: Five portable images were submitted. Images show placement posterior
fusion hardware from C3 to C6. The orthopedic hardware appears well
positioned.

Previous interbody fusions spans C3-C5.
IMPRESSION: Operative imaging provided for posterior cervical spine fusion.

## 2020-09-28 MED ORDER — METHOTREXATE 2.5 MG PO TABS
ORAL_TABLET | ORAL | 3 refills | Status: DC
  Filled 2020-09-28: qty 20, 28d supply, fill #0
  Filled 2020-11-15: qty 20, 28d supply, fill #1
  Filled 2020-12-10: qty 20, 28d supply, fill #2
  Filled 2021-01-10: qty 20, 28d supply, fill #3

## 2020-09-28 NOTE — Telephone Encounter (Signed)
Requested medications are due for refill today.  yes  Requested medications are on the active medications list.  yes  Last refill. 04/13/2020  Future visit scheduled.   yes  Notes to clinic.  Medication not delegated.

## 2020-09-29 ENCOUNTER — Other Ambulatory Visit: Payer: Self-pay

## 2020-09-29 MED ORDER — VITAMIN D (ERGOCALCIFEROL) 1.25 MG (50000 UNIT) PO CAPS
ORAL_CAPSULE | ORAL | 1 refills | Status: DC
  Filled 2020-09-29: qty 12, 84d supply, fill #0
  Filled 2020-12-20: qty 12, 84d supply, fill #1

## 2020-10-08 ENCOUNTER — Other Ambulatory Visit: Payer: Self-pay

## 2020-10-08 MED ORDER — CHLORHEXIDINE GLUCONATE 0.12 % MT SOLN
OROMUCOSAL | 1 refills | Status: DC
  Filled 2020-10-08: qty 473, 14d supply, fill #0

## 2020-10-08 MED ORDER — AMOXICILLIN-POT CLAVULANATE 500-125 MG PO TABS
1.0000 | ORAL_TABLET | Freq: Three times a day (TID) | ORAL | 0 refills | Status: DC
  Filled 2020-10-08: qty 21, 7d supply, fill #0

## 2020-10-09 ENCOUNTER — Other Ambulatory Visit
Admission: RE | Admit: 2020-10-09 | Discharge: 2020-10-09 | Disposition: A | Discharge status: Home / Self Care | Payer: 59 | Attending: Rheumatology | Admitting: Rheumatology

## 2020-10-09 DIAGNOSIS — M0609 Rheumatoid arthritis without rheumatoid factor, multiple sites: Secondary | ICD-10-CM | POA: Diagnosis not present

## 2020-10-09 DIAGNOSIS — Z79899 Other long term (current) drug therapy: Secondary | ICD-10-CM | POA: Insufficient documentation

## 2020-10-09 LAB — CBC WITH DIFFERENTIAL/PLATELET
Abs Immature Granulocytes: 0.04 10*3/uL (ref 0.00–0.07)
Basophils Absolute: 0.1 10*3/uL (ref 0.0–0.1)
Basophils Relative: 1 %
Eosinophils Absolute: 0.1 10*3/uL (ref 0.0–0.5)
Eosinophils Relative: 2 %
HCT: 38.4 % — ABNORMAL LOW (ref 39.0–52.0)
Hemoglobin: 14.3 g/dL (ref 13.0–17.0)
Immature Granulocytes: 1 %
Lymphocytes Relative: 44 %
Lymphs Abs: 3.3 10*3/uL (ref 0.7–4.0)
MCH: 32.9 pg (ref 26.0–34.0)
MCHC: 37.2 g/dL — ABNORMAL HIGH (ref 30.0–36.0)
MCV: 88.5 fL (ref 80.0–100.0)
Monocytes Absolute: 0.4 10*3/uL (ref 0.1–1.0)
Monocytes Relative: 5 %
Neutro Abs: 3.6 10*3/uL (ref 1.7–7.7)
Neutrophils Relative %: 47 %
Platelets: 339 10*3/uL (ref 150–400)
RBC: 4.34 MIL/uL (ref 4.22–5.81)
RDW: 12.5 % (ref 11.5–15.5)
WBC: 7.6 10*3/uL (ref 4.0–10.5)
nRBC: 0 % (ref 0.0–0.2)

## 2020-10-09 LAB — CREATININE, SERUM
Creatinine, Ser: 0.69 mg/dL (ref 0.61–1.24)
GFR, Estimated: >60 mL/min

## 2020-10-09 LAB — AST: AST: 29 U/L (ref 15–41)

## 2020-10-09 LAB — SEDIMENTATION RATE: Sed Rate: 9 mm/h (ref 0–20)

## 2020-10-09 LAB — ALT: ALT: 32 U/L (ref 0–44)

## 2020-10-12 ENCOUNTER — Other Ambulatory Visit: Payer: Self-pay

## 2020-10-28 ENCOUNTER — Other Ambulatory Visit: Payer: Self-pay

## 2020-11-16 ENCOUNTER — Other Ambulatory Visit: Payer: Self-pay

## 2020-11-17 ENCOUNTER — Ambulatory Visit
Payer: 59 | Attending: Student in an Organized Health Care Education/Training Program | Admitting: Student in an Organized Health Care Education/Training Program

## 2020-11-17 ENCOUNTER — Encounter: Payer: Self-pay | Admitting: Student in an Organized Health Care Education/Training Program

## 2020-11-17 ENCOUNTER — Other Ambulatory Visit: Payer: Self-pay

## 2020-11-17 VITALS — BP 152/82 | HR 92 | Temp 97.2°F | Resp 14 | Ht 70.0 in | Wt 240.0 lb

## 2020-11-17 DIAGNOSIS — Z981 Arthrodesis status: Secondary | ICD-10-CM | POA: Insufficient documentation

## 2020-11-17 DIAGNOSIS — G894 Chronic pain syndrome: Secondary | ICD-10-CM | POA: Insufficient documentation

## 2020-11-17 DIAGNOSIS — Q761 Klippel-Feil syndrome: Secondary | ICD-10-CM | POA: Diagnosis not present

## 2020-11-17 DIAGNOSIS — M5412 Radiculopathy, cervical region: Secondary | ICD-10-CM | POA: Insufficient documentation

## 2020-11-17 MED ORDER — PREGABALIN 50 MG PO CAPS
50.0000 mg | ORAL_CAPSULE | Freq: Every day | ORAL | 5 refills | Status: DC
Start: 2020-11-17 — End: 2021-05-11
  Filled 2020-11-17: qty 60, 30d supply, fill #0

## 2020-11-17 MED ORDER — HYDROCODONE-ACETAMINOPHEN 7.5-325 MG PO TABS
1.0000 | ORAL_TABLET | Freq: Four times a day (QID) | ORAL | 0 refills | Status: AC | PRN
  Filled 2020-12-28: qty 8, 2d supply, fill #0
  Filled 2020-12-28: qty 112, 28d supply, fill #0

## 2020-11-17 MED ORDER — HYDROCODONE-ACETAMINOPHEN 7.5-325 MG PO TABS
1.0000 | ORAL_TABLET | Freq: Four times a day (QID) | ORAL | 0 refills | Status: AC | PRN
  Filled 2020-11-27: qty 120, 30d supply, fill #0

## 2020-11-17 MED ORDER — HYDROCODONE-ACETAMINOPHEN 7.5-325 MG PO TABS
1.0000 | ORAL_TABLET | Freq: Four times a day (QID) | ORAL | 0 refills | Status: DC | PRN
  Filled 2021-01-28: qty 120, 30d supply, fill #0

## 2020-11-17 NOTE — Progress Notes (Signed)
PROVIDER NOTE: Information contained herein reflects review and annotations entered in association with encounter. Interpretation of such information and data should be left to medically-trained personnel. Information provided to patient can be located elsewhere in the medical record under "Patient Instructions". Document created using STT-dictation technology, any transcriptional errors that may result from process are unintentional.    Patient: Tony Cardenas  Service Category: E/M  Provider: Gillis Santa, MD  DOB: Jul 13, 1970  DOS: 08/18/2020  Specialty: Interventional Pain Management  MRN: [id number]  Setting: Ambulatory outpatient  PCP: Valerie Roys, DO  Type: Established Patient    Referring Provider: Valerie Roys, DO  Location: Office  Delivery: Face-to-face     HPI  [name], a 50 y.o. 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 Tony Cardenas primary complain today is Neck Pain Last encounter: My last encounter with him was on 08/18/2020 Pertinent problems: Tony Cardenas has S/P cervical spinal fusion; Cervical radicular pain; Cervical fusion syndrome; Chronic pain syndrome; and Cervical spondylosis with myelopathy and radiculopathy on their pertinent problem list. Pain Assessment: Severity of Chronic pain is reported as a 8 /10. Location: Neck  /denies. Onset: More than a month ago. Quality: Dull. Timing: Constant. Modifying factor(s): heat, rest, medications. Vitals:  height is 5' 10" (1.778 m) and weight is 240 lb (108.9 kg). His temporal temperature is 97.2 F (36.2 C) (abnormal). His blood pressure is 152/82 (abnormal) and his pulse is 92. His respiration is 14 and oxygen saturation is 99%.   Reason for encounter: medication management.    No change in medical history since last visit.  Patient's pain is at baseline.  Patient continues multimodal pain regimen as prescribed.  States that it provides pain relief and improvement in  functional status. Patient utilizes hydrocodone every 6 to 8 hours as needed depending upon activity level in pain severity given day   Pharmacotherapy Assessment  Analgesic: Hydrocodone 7.5 mg QID PRN   Monitoring: [place] PMP: PDMP reviewed during this encounter.       Pharmacotherapy: No side-effects or adverse reactions reported. Compliance: No problems identified. Effectiveness: Clinically acceptable.  UDS:  Summary  Date Value Ref Range Status  02/20/2020 Note  Final    Comment:    ==================================================================== [facility] (MW) ==================================================================== Test                             Result       Flag       Units  Drug Present and Declared for Prescription Verification   Hydrocodone                    1684         EXPECTED   ng/mg creat   Dihydrocodeine                 190          EXPECTED   ng/mg creat   Norhydrocodone                 835          EXPECTED   ng/mg creat    Sources of hydrocodone include scheduled prescription medications.    Dihydrocodeine and norhydrocodone are expected metabolites of    hydrocodone. Dihydrocodeine is also available as a scheduled    prescription medication.  ==================================================================== Test  Result    Flag   Units      Ref Range   Creatinine              31               mg/dL      >=20 ==================================================================== Declared Medications:  The flagging and interpretation on this report are based on the  following declared medications.  Unexpected results may arise from  inaccuracies in the declared medications.   **Note: The testing scope of this panel includes these medications:   Hydrocodone (Norco)   **Note: The testing scope of this panel does not include the  following reported medications:   Acetaminophen (Norco)  Albuterol (Ventolin  HFA)  Empagliflozin (Jardiance)  Ezetimibe (Zetia)  Fluoride (Prevident)  Fluticasone (Flonase)  Folic Acid  Lisinopril (Zestril)  Metformin (Glucophage)  Methocarbamol (Robaxin)  Methotrexate  Omega-3 Fatty Acids  Pregabalin (Lyrica)  Terbinafine (Lamisil)  Vitamin D2 (Drisdol) ==================================================================== For clinical consultation, please call [phone]. ====================================================================       ROS  Constitutional: Denies any fever or chills Gastrointestinal: No reported hemesis, hematochezia, vomiting, or acute GI distress Musculoskeletal:  mild neck pain Neurological: No reported episodes of acute onset apraxia, aphasia, dysarthria, agnosia, amnesia, paralysis, loss of coordination, or loss of consciousness  Medication Review  HYDROcodone-acetaminophen, Vitamin D (Ergocalciferol), albuterol, blood glucose meter kit and supplies, empagliflozin, ezetimibe, fluticasone, folic acid, lisinopril, metFORMIN, methocarbamol, methotrexate, omega-3 acid ethyl esters, and pregabalin  History Review  Allergy: Tony Cardenas is allergic to atorvastatin and hydromorphone hcl. Drug: Tony Cardenas  reports no history of drug use. Alcohol:  reports no history of alcohol use. Tobacco:  reports that he quit smoking about 20 months ago. His smoking use included cigarettes. He has a 10.00 pack-year smoking history. He quit smokeless tobacco use about 4 years ago.  His smokeless tobacco use included chew. Social: Tony Cardenas  reports that he quit smoking about 20 months ago. His smoking use included cigarettes. He has a 10.00 pack-year smoking history. He quit smokeless tobacco use about 4 years ago.  His smokeless tobacco use included chew. He reports that he does not drink alcohol and does not use drugs. Medical:  has a past medical history of Arthritis, COPD (chronic obstructive pulmonary disease) ([place]), Diabetes  mellitus without complication ([place]), GERD (gastroesophageal reflux disease), Hyperlipidemia, Hypertension, and Neck pain (1989). Surgical: Tony Cardenas  has a past surgical history that includes Neck surgery (1989); Knee surgery (Right); Colonoscopy (N/A, 06/02/2014); Polypectomy (06/02/2014); Appendectomy; Esophagogastroduodenoscopy (egd) with propofol (N/A, 07/29/2016); Back surgery; Anterior cervical corpectomy (N/A, 10/10/2018); Hernia repair (over 10 years ago); Anterior cervical corpectomy (N/A, 05/01/2019); and Posterior cervical fusion/foraminotomy (N/A, 05/01/2019). Family: family history includes Alcohol abuse in his father; COPD in his father; Cancer in his mother; Diabetes in his brother, father, and paternal grandmother.  Laboratory Chemistry Profile   Renal Lab Results  Component Value Date   BUN 9 06/30/2020   CREATININE 0.69 10/09/2020   BCR 11 06/30/2020   GFRAA 114 12/16/2019   GFRNONAA >60 10/09/2020     Hepatic Lab Results  Component Value Date   AST 29 10/09/2020   ALT 32 10/09/2020   ALBUMIN 4.5 06/30/2020   ALKPHOS 65 06/30/2020   HCVAB NON REACTIVE 03/24/2020     Electrolytes Lab Results  Component Value Date   NA 139 06/30/2020   K 3.7 06/30/2020   CL 98 06/30/2020   CALCIUM 9.0 06/30/2020     Bone Lab  Results  Component Value Date   VD25OH 31.4 06/30/2020     Inflammation (CRP: Acute Phase) (ESR: Chronic Phase) Lab Results  Component Value Date   CRP 0.7 05/26/2020   ESRSEDRATE 9 10/09/2020       Note: Above Lab results reviewed.  Recent Imaging Review  DG Chest 2 View CLINICAL DATA:  History of recent COVID-19 infection  EXAM: CHEST - 2 VIEW  COMPARISON:  04/24/2017  FINDINGS: Cardiac shadow is within normal limits. Patchy airspace opacities are noted in the bases bilaterally consistent with the given clinical history. No sizable effusion is seen. No acute bony abnormality is noted. Postsurgical changes of the cervical spine  are noted.  IMPRESSION: Patchy airspace opacities in the bases bilaterally consistent with the given clinical history.  Electronically Signed   By: Inez Catalina M.D.   On: 01/13/2020 16:43 Note: Reviewed        Physical Exam  General appearance: Well nourished, well developed, and well hydrated. In no apparent acute distress Mental status: Alert, oriented x 3 (person, place, & time)       Respiratory: No evidence of acute respiratory distress Eyes: PERLA Vitals: BP (!) 152/82   Pulse 92   Temp (!) 97.2 F (36.2 C) (Temporal)   Resp 14   Ht $R'5\' 10"'Az$  (1.778 m)   Wt 240 lb (108.9 kg)   SpO2 99%   BMI 34.44 kg/m  BMI: Estimated body mass index is 34.44 kg/m as calculated from the following:   Height as of this encounter: $RemoveBeforeD'5\' 10"'[id number]$  (1.778 m).   Weight as of this encounter: 240 lb (108.9 kg). Ideal: Ideal body weight: 73 kg (160 lb 15 oz) Adjusted ideal body weight: 87.3 kg (192 lb 9 oz)  Limited range of motion with cervical extension. Upper extremity strength Neuropathic pain pattern of upper extremity, improving Neuropathic pain of bilateral lower extremity   Assessment   Status Diagnosis  Controlled Controlled Controlled 1. Cervical fusion syndrome   2. Cervical radicular pain   3. S/P cervical spinal fusion   4. Chronic pain syndrome         Plan of Care   Tony Cardenas has a current medication list which includes the following long-term medication(s): albuterol, ezetimibe, fluticasone, lisinopril, metformin, omega-3 acid ethyl esters, and pregabalin.  Pharmacotherapy (Medications Ordered): Meds ordered this encounter  Medications   HYDROcodone-acetaminophen (NORCO) 7.5-325 MG tablet    Sig: Take 1 tablet by mouth every 6 (six) hours as needed for moderate pain.    Dispense:  120 tablet    Refill:  0   HYDROcodone-acetaminophen (NORCO) 7.5-325 MG tablet    Sig: Take 1 tablet by mouth every 6 (six) hours as needed for moderate pain.    Dispense:   120 tablet    Refill:  0   HYDROcodone-acetaminophen (NORCO) 7.5-325 MG tablet    Sig: Take 1 tablet by mouth every 6 (six) hours as needed for moderate pain.    Dispense:  120 tablet    Refill:  0   pregabalin (LYRICA) 50 MG capsule    Sig: Take 1-2 capsules (50-100 mg total) by mouth at bedtime.    Dispense:  60 capsule    Refill:  5    Discussed Qutenza, capsaicin 8% treatment for painful diabetic neuropathy of lower extremity.  Follow-up plan:   Return in about 14 weeks (around 02/23/2021) for Medication Management, in person.   Recent Visits No visits were found meeting these conditions. Showing recent  visits within past 90 days and meeting all other requirements Today's Visits Date Type Provider Dept  11/17/20 Office Visit Gillis Santa, MD [facility]  Showing today's visits and meeting all other requirements Future Appointments No visits were found meeting these conditions. Showing future appointments within next 90 days and meeting all other requirements I discussed the assessment and treatment plan with the patient. The patient was provided an opportunity to ask questions and all were answered. The patient agreed with the plan and demonstrated an understanding of the instructions.  Patient advised to call back or seek an in-person evaluation if the symptoms or condition worsens.  Duration of encounter: 21mnutes.  Note by: BGillis Santa MD Date: 11/17/2020; Time: 8:53 AM

## 2020-11-17 NOTE — Progress Notes (Signed)
Nursing Pain Medication Assessment:  Safety precautions to be maintained throughout the outpatient stay will include: orient to surroundings, keep bed in low position, maintain call bell within reach at all times, provide assistance with transfer out of bed and ambulation.  Medication Inspection Compliance: Pill count conducted under aseptic conditions, in front of the patient. Neither the pills nor the bottle was removed from the patient's sight at any time. Once count was completed pills were immediately returned to the patient in their original bottle.  Medication: Hydrocodone/APAP Pill/Patch Count:  44 of 120 pills remain Pill/Patch Appearance: Markings consistent with prescribed medication Bottle Appearance: Standard pharmacy container. Clearly labeled. Filled Date: 55 / 19 / 2022 Last Medication intake:  Today

## 2020-11-18 ENCOUNTER — Other Ambulatory Visit: Payer: Self-pay

## 2020-11-26 ENCOUNTER — Other Ambulatory Visit: Payer: Self-pay

## 2020-11-27 ENCOUNTER — Other Ambulatory Visit: Payer: Self-pay

## 2020-12-11 ENCOUNTER — Other Ambulatory Visit: Payer: Self-pay

## 2020-12-20 MED FILL — Folic Acid Tab 1 MG: ORAL | 90 days supply | Qty: 90 | Fill #2 | Status: AC

## 2020-12-21 ENCOUNTER — Other Ambulatory Visit: Payer: Self-pay

## 2020-12-28 ENCOUNTER — Other Ambulatory Visit: Payer: Self-pay

## 2020-12-29 ENCOUNTER — Other Ambulatory Visit: Payer: Self-pay

## 2021-01-06 ENCOUNTER — Ambulatory Visit: Payer: 59 | Admitting: Family Medicine

## 2021-01-06 DIAGNOSIS — H524 Presbyopia: Secondary | ICD-10-CM | POA: Diagnosis not present

## 2021-01-06 DIAGNOSIS — E119 Type 2 diabetes mellitus without complications: Secondary | ICD-10-CM | POA: Diagnosis not present

## 2021-01-06 LAB — HM DIABETES EYE EXAM

## 2021-01-07 ENCOUNTER — Other Ambulatory Visit: Payer: Self-pay

## 2021-01-07 ENCOUNTER — Ambulatory Visit: Payer: 59 | Admitting: Family Medicine

## 2021-01-07 ENCOUNTER — Encounter: Payer: Self-pay | Admitting: Family Medicine

## 2021-01-07 VITALS — BP 122/69 | HR 97 | Temp 97.9°F | Wt 248.4 lb

## 2021-01-07 DIAGNOSIS — Z1211 Encounter for screening for malignant neoplasm of colon: Secondary | ICD-10-CM

## 2021-01-07 DIAGNOSIS — K219 Gastro-esophageal reflux disease without esophagitis: Secondary | ICD-10-CM

## 2021-01-07 DIAGNOSIS — J449 Chronic obstructive pulmonary disease, unspecified: Secondary | ICD-10-CM | POA: Diagnosis not present

## 2021-01-07 DIAGNOSIS — I10 Essential (primary) hypertension: Secondary | ICD-10-CM

## 2021-01-07 DIAGNOSIS — Z23 Encounter for immunization: Secondary | ICD-10-CM | POA: Diagnosis not present

## 2021-01-07 DIAGNOSIS — D75839 Thrombocytosis, unspecified: Secondary | ICD-10-CM | POA: Diagnosis not present

## 2021-01-07 DIAGNOSIS — E782 Mixed hyperlipidemia: Secondary | ICD-10-CM | POA: Diagnosis not present

## 2021-01-07 DIAGNOSIS — E1165 Type 2 diabetes mellitus with hyperglycemia: Secondary | ICD-10-CM

## 2021-01-07 DIAGNOSIS — R7989 Other specified abnormal findings of blood chemistry: Secondary | ICD-10-CM

## 2021-01-07 DIAGNOSIS — E559 Vitamin D deficiency, unspecified: Secondary | ICD-10-CM

## 2021-01-07 LAB — BAYER DCA HB A1C WAIVED: HB A1C (BAYER DCA - WAIVED): 9.5 % — ABNORMAL HIGH (ref 4.8–5.6)

## 2021-01-07 MED ORDER — EMPAGLIFLOZIN 25 MG PO TABS
ORAL_TABLET | Freq: Every day | ORAL | 1 refills | Status: DC
  Filled 2021-01-07 – 2021-02-19 (×2): qty 90, 90d supply, fill #0

## 2021-01-07 MED ORDER — OMEGA-3-ACID ETHYL ESTERS 1 G PO CAPS
1.0000 g | ORAL_CAPSULE | Freq: Every day | ORAL | 1 refills | Status: DC
  Filled 2021-01-07 – 2021-02-19 (×2): qty 90, 90d supply, fill #0
  Filled 2021-05-14: qty 90, 90d supply, fill #1

## 2021-01-07 MED ORDER — METFORMIN HCL ER 500 MG PO TB24
ORAL_TABLET | Freq: Two times a day (BID) | ORAL | 1 refills | Status: DC
  Filled 2021-01-07: qty 360, 90d supply, fill #0

## 2021-01-07 MED ORDER — BLOOD GLUCOSE MONITOR KIT
PACK | 0 refills | Status: DC
Start: 2021-01-07 — End: 2021-12-13
  Filled 2021-01-07: qty 1, fill #0

## 2021-01-07 MED ORDER — LISINOPRIL 5 MG PO TABS
ORAL_TABLET | Freq: Every day | ORAL | 1 refills | Status: DC
  Filled 2021-01-07: qty 90, 90d supply, fill #0
  Filled 2021-04-18: qty 90, 90d supply, fill #1

## 2021-01-07 MED ORDER — ALBUTEROL SULFATE HFA 108 (90 BASE) MCG/ACT IN AERS
2.0000 | INHALATION_SPRAY | Freq: Four times a day (QID) | RESPIRATORY_TRACT | 3 refills | Status: DC | PRN
  Filled 2021-01-07: qty 18, 25d supply, fill #0

## 2021-01-07 MED ORDER — EZETIMIBE 10 MG PO TABS
ORAL_TABLET | Freq: Every day | ORAL | 1 refills | Status: DC
  Filled 2021-01-07 – 2021-01-31 (×2): qty 90, 90d supply, fill #0

## 2021-01-07 NOTE — Assessment & Plan Note (Signed)
Under good control on current regimen. Continue current regimen. Continue to monitor. Call with any concerns. Refills given. Labs drawn today.   

## 2021-01-07 NOTE — Assessment & Plan Note (Signed)
Not doing well with A1c of 9.5 up from 6.4! Likely due to steroids in the past couple of months. Will continue current regimen, start testing and really watch diet. Recheck 3 months. If still >7.5 will add weekly injectable. Continue to monitor.

## 2021-01-07 NOTE — Assessment & Plan Note (Signed)
Rechecking labs today. Await results. Treat as needed.  °

## 2021-01-07 NOTE — Progress Notes (Signed)
BP 122/69    Pulse 97    Temp 97.9 F (36.6 C)    Wt 248 lb 6.4 oz (112.7 kg)    SpO2 95%    BMI 35.64 kg/m    Subjective:    Patient ID: Tony Cardenas, male    DOB: [birth year], 50 y.o.   MRN: [id number]  HPI: Tony Cardenas is a 50 y.o. male  Chief Complaint  Patient presents with   Diabetes   Hypertension   Hyperlipidemia   COPD   HYPERTENSION / HYPERLIPIDEMIA Satisfied with current treatment? yes Duration of hypertension: chronic BP monitoring frequency: not checking BP medication side effects: no Past BP meds: lisinopril Duration of hyperlipidemia: chronic Cholesterol medication side effects: no Cholesterol supplements: none Past cholesterol medications: lovaza, zetia Medication compliance: excellent compliance Aspirin: no Recent stressors: no Recurrent headaches: no Visual changes: no Palpitations: no Dyspnea: no Chest pain: no Lower extremity edema: no Dizzy/lightheaded: no  DIABETES Hypoglycemic episodes:no Polydipsia/polyuria: no Visual disturbance: no Chest pain: no Paresthesias: no Glucose Monitoring: no  Accucheck frequency: Not Checking Taking Insulin?: no Blood Pressure Monitoring: not checking Retinal Examination: Up to Date Foot Exam: Up to Date Diabetic Education: Completed Pneumovax: Up to Date Influenza:  Given today Aspirin: no  GERD GERD control status: controlled Satisfied with current treatment? yes Medication side effects: no  Medication compliance: excellent Dysphagia: no Odynophagia:  no Hematemesis: no Blood in stool: no EGD: no  COPD COPD status: controlled Satisfied with current treatment?: yes Oxygen use: no Dyspnea frequency:  Cough frequency:  Rescue inhaler frequency:   Limitation of activity: no Productive cough:  Pneumovax: Up to Date Influenza: Up to Date  Relevant past medical, surgical, family and social history reviewed and updated as indicated. Interim medical history since our last visit  reviewed. Allergies and medications reviewed and updated.  Review of Systems  Constitutional: Negative.   Respiratory: Negative.    Cardiovascular: Negative.   Musculoskeletal:  Positive for arthralgias and myalgias. Negative for back pain, gait problem, joint swelling, neck pain and neck stiffness.  Skin: Negative.   Neurological:  Positive for numbness. Negative for dizziness, tremors, seizures, syncope, facial asymmetry, speech difficulty, weakness, light-headedness and headaches.  Psychiatric/Behavioral: Negative.     Per HPI unless specifically indicated above     Objective:    BP 122/69    Pulse 97    Temp 97.9 F (36.6 C)    Wt 248 lb 6.4 oz (112.7 kg)    SpO2 95%    BMI 35.64 kg/m   Wt Readings from Last 3 Encounters:  01/07/21 248 lb 6.4 oz (112.7 kg)  11/17/20 240 lb (108.9 kg)  08/18/20 235 lb (106.6 kg)    Physical Exam Vitals and nursing note reviewed.  Constitutional:      General: He is not in acute distress.    Appearance: Normal appearance. He is obese. He is not ill-appearing, toxic-appearing or diaphoretic.  HENT:     Head: Normocephalic and atraumatic.     Right Ear: External ear normal.     Left Ear: External ear normal.     Nose: Nose normal.     Mouth/Throat:     Mouth: Mucous membranes are moist.     Pharynx: Oropharynx is clear.  Eyes:     General: No scleral icterus.       Right eye: No discharge.        Left eye: No discharge.     Extraocular Movements: Extraocular movements intact.  Conjunctiva/sclera: Conjunctivae normal.     Pupils: Pupils are equal, round, and reactive to light.  Cardiovascular:     Rate and Rhythm: Normal rate and regular rhythm.     Pulses: Normal pulses.     Heart sounds: Normal heart sounds. No murmur heard.   No friction rub. No gallop.  Pulmonary:     Effort: Pulmonary effort is normal. No respiratory distress.     Breath sounds: Normal breath sounds. No stridor. No wheezing, rhonchi or rales.  Chest:      Chest wall: No tenderness.  Musculoskeletal:        General: Normal range of motion.     Cervical back: Normal range of motion and neck supple.  Skin:    General: Skin is warm and dry.     Capillary Refill: Capillary refill takes less than 2 seconds.     Coloration: Skin is not jaundiced or pale.     Findings: No bruising, erythema, lesion or rash.  Neurological:     General: No focal deficit present.     Mental Status: He is alert and oriented to person, place, and time. Mental status is at baseline.  Psychiatric:        Mood and Affect: Mood normal.        Behavior: Behavior normal.        Thought Content: Thought content normal.        Judgment: Judgment normal.    Results for orders placed or performed during the hospital encounter of 10/09/20  Sedimentation rate  Result Value Ref Range   Sed Rate 9 0 - 20 mm/hr  Creatinine, serum  Result Value Ref Range   Creatinine, Ser 0.69 0.61 - 1.24 mg/dL   GFR, Estimated >60 >60 mL/min  CBC with Differential/Platelet  Result Value Ref Range   WBC 7.6 4.0 - 10.5 K/uL   RBC 4.34 4.22 - 5.81 MIL/uL   Hemoglobin 14.3 13.0 - 17.0 g/dL   HCT 38.4 (L) 39.0 - 52.0 %   MCV 88.5 80.0 - 100.0 fL   MCH 32.9 26.0 - 34.0 pg   MCHC 37.2 (H) 30.0 - 36.0 g/dL   RDW 12.5 11.5 - 15.5 %   Platelets 339 150 - 400 K/uL   nRBC 0.0 0.0 - 0.2 %   Neutrophils Relative % 47 %   Neutro Abs 3.6 1.7 - 7.7 K/uL   Lymphocytes Relative 44 %   Lymphs Abs 3.3 0.7 - 4.0 K/uL   Monocytes Relative 5 %   Monocytes Absolute 0.4 0.1 - 1.0 K/uL   Eosinophils Relative 2 %   Eosinophils Absolute 0.1 0.0 - 0.5 K/uL   Basophils Relative 1 %   Basophils Absolute 0.1 0.0 - 0.1 K/uL   Immature Granulocytes 1 %   Abs Immature Granulocytes 0.04 0.00 - 0.07 K/uL  AST  Result Value Ref Range   AST 29 15 - 41 U/L  ALT  Result Value Ref Range   ALT 32 0 - 44 U/L      Assessment & Plan:   Problem List Items Addressed This Visit       Cardiovascular and Mediastinum    HTN (hypertension)    Under good control on current regimen. Continue current regimen. Continue to monitor. Call with any concerns. Refills given. Labs drawn today.        Relevant Medications   omega-3 acid ethyl esters (LOVAZA) 1 g capsule   ezetimibe (ZETIA) 10 MG tablet   lisinopril (  ZESTRIL) 5 MG tablet   Other Relevant Orders   CBC with Differential/Platelet   Comprehensive metabolic panel     Respiratory   COPD (chronic obstructive pulmonary disease) (H[facility])    Under good control on current regimen. Continue current regimen. Continue to monitor. Call with any concerns. Refills given. Labs drawn today.        Relevant Medications   albuterol (VENTOLIN HFA) 108 (90 Base) MCG/ACT inhaler   Other Relevant Orders   CBC with Differential/Platelet   Comprehensive metabolic panel     Digestive   GERD (gastroesophageal reflux disease)    Under good control on current regimen. Continue current regimen. Continue to monitor. Call with any concerns. Refills given. Labs drawn today.        Relevant Orders   CBC with Differential/Platelet   Comprehensive metabolic panel     Endocrine   Type 2 diabetes mellitus with hyperglycemia ([place]) - Primary    Not doing well with A1c of 9.5 up from 6.4! Likely due to steroids in the past couple of months. Will continue current regimen, start testing and really watch diet. Recheck 3 months. If still >7.5 will add weekly injectable. Continue to monitor.       Relevant Medications   empagliflozin (JARDIANCE) 25 MG TABS tablet   lisinopril (ZESTRIL) 5 MG tablet   metFORMIN (GLUCOPHAGE-XR) 500 MG 24 hr tablet   Other Relevant Orders   CBC with Differential/Platelet   Bayer DCA Hb A1c Waived   Comprehensive metabolic panel     Hematopoietic and Hemostatic   Thrombocytosis    Rechecking labs today. Await results. Treat as needed.       Relevant Orders   CBC with Differential/Platelet   Comprehensive metabolic panel     Other    Hyperlipidemia    Under good control on current regimen. Continue current regimen. Continue to monitor. Call with any concerns. Refills given. Labs drawn today.       Relevant Medications   omega-3 acid ethyl esters (LOVAZA) 1 g capsule   ezetimibe (ZETIA) 10 MG tablet   lisinopril (ZESTRIL) 5 MG tablet   Other Relevant Orders   CBC with Differential/Platelet   Comprehensive metabolic panel   Lipid Panel w/o Chol/HDL Ratio   Abnormal TSH    Rechecking labs today. Await results. Treat as needed.       Relevant Orders   CBC with Differential/Platelet   Comprehensive metabolic panel   TSH   Vitamin D deficiency    Rechecking labs today. Await results. Treat as needed.       Relevant Orders   CBC with Differential/Platelet   Comprehensive metabolic panel   VITAMIN D 25 Hydroxy (Vit-D Deficiency, Fractures)   Other Visit Diagnoses     Need for influenza vaccination       Flu shot given today.   Relevant Orders   Flu Vaccine QUAD 6+ mos PF IM (Fluarix Quad PF)   Screening for colon cancer       Referral to GI placed today.   Relevant Orders   Ambulatory referral to Gastroenterology        Follow up plan: Return in about 3 months (around 04/07/2021).

## 2021-01-08 LAB — CBC WITH DIFFERENTIAL/PLATELET
Basophils Absolute: 0.1 10*3/uL (ref 0.0–0.2)
Basos: 1 %
EOS (ABSOLUTE): 0.1 10*3/uL (ref 0.0–0.4)
Eos: 1 %
Hematocrit: 42.2 % (ref 37.5–51.0)
Hemoglobin: 14.7 g/dL (ref 13.0–17.7)
Immature Grans (Abs): 0 10*3/uL (ref 0.0–0.1)
Immature Granulocytes: 0 %
Lymphocytes Absolute: 2.7 10*3/uL (ref 0.7–3.1)
Lymphs: 40 %
MCH: 32.2 pg (ref 26.6–33.0)
MCHC: 34.8 g/dL (ref 31.5–35.7)
MCV: 92 fL (ref 79–97)
Monocytes Absolute: 0.4 10*3/uL (ref 0.1–0.9)
Monocytes: 5 %
Neutrophils Absolute: 3.5 10*3/uL (ref 1.4–7.0)
Neutrophils: 53 %
Platelets: 329 10*3/uL (ref 150–450)
RBC: 4.57 x10E6/uL (ref 4.14–5.80)
RDW: 12.9 % (ref 11.6–15.4)
WBC: 6.8 10*3/uL (ref 3.4–10.8)

## 2021-01-08 LAB — LIPID PANEL W/O CHOL/HDL RATIO
Cholesterol, Total: 244 mg/dL — ABNORMAL HIGH (ref 100–199)
HDL: 23 mg/dL — ABNORMAL LOW (ref >39)
Triglycerides: 1270 mg/dL (ref 0–149)

## 2021-01-08 LAB — COMPREHENSIVE METABOLIC PANEL
ALT: 23 IU/L (ref 0–44)
AST: 22 IU/L (ref 0–40)
Albumin/Globulin Ratio: 2.1 (ref 1.2–2.2)
Albumin: 4.5 g/dL (ref 3.8–4.9)
Alkaline Phosphatase: 69 IU/L (ref 44–121)
BUN/Creatinine Ratio: 10 (ref 9–20)
BUN: 8 mg/dL (ref 6–24)
Bilirubin Total: <0.2 mg/dL (ref 0.0–1.2)
CO2: 18 mmol/L — ABNORMAL LOW (ref 20–29)
Calcium: 9.2 mg/dL (ref 8.7–10.2)
Chloride: 97 mmol/L (ref 96–106)
Creatinine, Ser: 0.78 mg/dL (ref 0.76–1.27)
Globulin, Total: 2.1 g/dL (ref 1.5–4.5)
Glucose: 410 mg/dL — ABNORMAL HIGH (ref 70–99)
Potassium: 4.1 mmol/L (ref 3.5–5.2)
Sodium: 136 mmol/L (ref 134–144)
Total Protein: 6.6 g/dL (ref 6.0–8.5)
eGFR: 107 mL/min/{1.73_m2} (ref >59)

## 2021-01-08 LAB — VITAMIN D 25 HYDROXY (VIT D DEFICIENCY, FRACTURES): Vit D, 25-Hydroxy: 24.7 ng/mL — ABNORMAL LOW (ref 30.0–100.0)

## 2021-01-08 LAB — TSH: TSH: 0.286 u[IU]/mL — ABNORMAL LOW (ref 0.450–4.500)

## 2021-01-11 ENCOUNTER — Other Ambulatory Visit: Payer: Self-pay

## 2021-01-12 ENCOUNTER — Other Ambulatory Visit: Payer: Self-pay

## 2021-01-12 ENCOUNTER — Telehealth: Payer: Self-pay

## 2021-01-12 NOTE — Telephone Encounter (Signed)
Called patient to schedule he wants to wait and schedule with his wife at her appointment

## 2021-01-28 ENCOUNTER — Other Ambulatory Visit: Payer: Self-pay

## 2021-01-31 ENCOUNTER — Other Ambulatory Visit: Payer: Self-pay

## 2021-02-01 ENCOUNTER — Other Ambulatory Visit: Payer: Self-pay

## 2021-02-01 DIAGNOSIS — M4722 Other spondylosis with radiculopathy, cervical region: Secondary | ICD-10-CM | POA: Diagnosis not present

## 2021-02-01 DIAGNOSIS — M4712 Other spondylosis with myelopathy, cervical region: Secondary | ICD-10-CM | POA: Diagnosis not present

## 2021-02-01 DIAGNOSIS — M0609 Rheumatoid arthritis without rheumatoid factor, multiple sites: Secondary | ICD-10-CM | POA: Diagnosis not present

## 2021-02-01 DIAGNOSIS — Z79899 Other long term (current) drug therapy: Secondary | ICD-10-CM | POA: Diagnosis not present

## 2021-02-01 MED ORDER — METHOTREXATE 2.5 MG PO TABS
ORAL_TABLET | ORAL | 3 refills | Status: DC
  Filled 2021-02-01: qty 20, 28d supply, fill #0

## 2021-02-03 ENCOUNTER — Other Ambulatory Visit: Payer: Self-pay

## 2021-02-16 ENCOUNTER — Other Ambulatory Visit: Payer: Self-pay | Admitting: Gastroenterology

## 2021-02-16 ENCOUNTER — Other Ambulatory Visit: Payer: Self-pay

## 2021-02-16 ENCOUNTER — Encounter: Payer: Self-pay | Admitting: Student in an Organized Health Care Education/Training Program

## 2021-02-16 ENCOUNTER — Ambulatory Visit
Payer: 59 | Attending: Student in an Organized Health Care Education/Training Program | Admitting: Student in an Organized Health Care Education/Training Program

## 2021-02-16 VITALS — BP 139/76 | HR 81 | Temp 97.9°F | Ht 70.0 in | Wt 240.0 lb

## 2021-02-16 DIAGNOSIS — M5412 Radiculopathy, cervical region: Secondary | ICD-10-CM | POA: Insufficient documentation

## 2021-02-16 DIAGNOSIS — Q761 Klippel-Feil syndrome: Secondary | ICD-10-CM | POA: Diagnosis not present

## 2021-02-16 DIAGNOSIS — G894 Chronic pain syndrome: Secondary | ICD-10-CM | POA: Insufficient documentation

## 2021-02-16 DIAGNOSIS — M542 Cervicalgia: Secondary | ICD-10-CM | POA: Diagnosis not present

## 2021-02-16 DIAGNOSIS — Z1211 Encounter for screening for malignant neoplasm of colon: Secondary | ICD-10-CM

## 2021-02-16 DIAGNOSIS — Z981 Arthrodesis status: Secondary | ICD-10-CM | POA: Insufficient documentation

## 2021-02-16 MED ORDER — NA SULFATE-K SULFATE-MG SULF 17.5-3.13-1.6 GM/177ML PO SOLN
1.0000 | Freq: Once | ORAL | 0 refills | Status: AC
  Filled 2021-02-16: qty 354, 1d supply, fill #0

## 2021-02-16 MED ORDER — HYDROCODONE-ACETAMINOPHEN 7.5-325 MG PO TABS
1.0000 | ORAL_TABLET | Freq: Four times a day (QID) | ORAL | 0 refills | Status: AC | PRN
  Filled 2021-03-01: qty 120, 30d supply, fill #0

## 2021-02-16 MED ORDER — HYDROCODONE-ACETAMINOPHEN 7.5-325 MG PO TABS
1.0000 | ORAL_TABLET | Freq: Four times a day (QID) | ORAL | 0 refills | Status: AC | PRN
  Filled 2021-03-29: qty 120, 30d supply, fill #0

## 2021-02-16 MED ORDER — HYDROCODONE-ACETAMINOPHEN 7.5-325 MG PO TABS
1.0000 | ORAL_TABLET | Freq: Four times a day (QID) | ORAL | 0 refills | Status: DC | PRN
  Filled 2021-04-28: qty 120, 30d supply, fill #0

## 2021-02-16 NOTE — Patient Instructions (Signed)

## 2021-02-16 NOTE — Progress Notes (Signed)
PROVIDER NOTE: Information contained herein reflects review and annotations entered in association with encounter. Interpretation of such information and data should be left to medically-trained personnel. Information provided to patient can be located elsewhere in the medical record under "Patient Instructions". Document created using STT-dictation technology, any transcriptional errors that may result from process are unintentional.    Patient: Tony Cardenas  Service Category: E/M  Provider: Gillis Santa, MD  DOB: 10/07/1970  DOS: 08/18/2020  Specialty: Interventional Pain Management  MRN: [id number]  Setting: Ambulatory outpatient  PCP: Valerie Roys, DO  Type: Established Patient    Referring Provider: Valerie Roys, DO  Location: Office  Delivery: Face-to-face     HPI  [name], a 51 y.o. 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 [name] primary complain today is Neck Pain and Shoulder Pain  Last encounter: My last encounter with him was on 08/18/2020 Pertinent problems: [name] has S/P cervical spinal fusion; Cervical radicular pain; Cervical fusion syndrome; Chronic pain syndrome; and Cervical spondylosis with myelopathy and radiculopathy on their pertinent problem list. Pain Assessment: Severity of Chronic pain is reported as a 8 /10. Location: Neck Right/radiates into both shoulders down to elbows at times. Onset: More than a month ago. Quality: Aching. Timing: Constant. Modifying factor(s): medications, rest. Vitals:  height is 5' 10" (1.778 m) and weight is 240 lb (108.9 kg). His temperature is 97.9 F (36.6 C). His blood pressure is 139/76 and his pulse is 81. His oxygen saturation is 98%.   Reason for encounter: medication management.    Patient had visit with his primary care provider.  A1c was very elevated at 9.5.  Patient continues on metformin.  We are holding off on any steroid based therapies at this time  until he is able to achieve better blood sugar management and reduce his A1c. He is requesting cervical injections.  Trigger point injections with local anesthetic only in this region.  Patient would like to proceed. Otherwise we will refill his hydrocodone . patient is also aware of Qutenza capsaicin neurolysis that could be effective for lower extremity paresthesias related to neuropathic pain secondary to diabetes.    Pharmacotherapy Assessment  Analgesic: Hydrocodone 7.5 mg QID PRN   Monitoring: [place] PMP: PDMP reviewed during this encounter.       Pharmacotherapy: No side-effects or adverse reactions reported. Compliance: No problems identified. Effectiveness: Clinically acceptable.  UDS:  Summary  Date Value Ref Range Status  02/20/2020 Note  Final    Comment:    ==================================================================== [employer] Select 13 ([facility]) ==================================================================== Test                             Result       Flag       Units  Drug Present and Declared for Prescription Verification   Hydrocodone                    1684         EXPECTED   ng/mg creat   Dihydrocodeine                 190          EXPECTED   ng/mg creat   Norhydrocodone                 835  EXPECTED   ng/mg creat    Sources of hydrocodone include scheduled prescription medications.    Dihydrocodeine and norhydrocodone are expected metabolites of    hydrocodone. Dihydrocodeine is also available as a scheduled    prescription medication.  ==================================================================== Test                      Result    Flag   Units      Ref Range   Creatinine              31               mg/dL      >=20 ==================================================================== Declared Medications:  The flagging and interpretation on this report are based on the  following declared medications.  Unexpected results may arise  from  inaccuracies in the declared medications.   **Note: The testing scope of this panel includes these medications:   Hydrocodone (Norco)   **Note: The testing scope of this panel does not include the  following reported medications:   Acetaminophen (Norco)  Albuterol (Ventolin HFA)  Empagliflozin (Jardiance)  Ezetimibe (Zetia)  Fluoride (Prevident)  Fluticasone (Flonase)  Folic Acid  Lisinopril (Zestril)  Metformin (Glucophage)  Methocarbamol (Robaxin)  Methotrexate  Omega-3 Fatty Acids  Pregabalin (Lyrica)  Terbinafine (Lamisil)  Vitamin D2 (Drisdol) ==================================================================== For clinical consultation, please call [phone]. ====================================================================       Renew annual urine toxicology screen for medication compliance monitoring  ROS  Constitutional: Denies any fever or chills Gastrointestinal: No reported hemesis, hematochezia, vomiting, or acute GI distress Musculoskeletal:  mild neck pain Neurological: No reported episodes of acute onset apraxia, aphasia, dysarthria, agnosia, amnesia, paralysis, loss of coordination, or loss of consciousness  Medication Review  HYDROcodone-acetaminophen, Vitamin D (Ergocalciferol), albuterol, blood glucose meter kit and supplies, empagliflozin, ezetimibe, fluticasone, folic acid, lisinopril, metFORMIN, methocarbamol, methotrexate, omega-3 acid ethyl esters, and pregabalin  History Review  Allergy: [name] is allergic to atorvastatin and hydromorphone hcl. Drug: [name]  reports no history of drug use. Alcohol:  reports no history of alcohol use. Tobacco:  reports that he quit smoking about 23 months ago. His smoking use included cigarettes. He has a 10.00 pack-year smoking history. He quit smokeless tobacco use about 4 years ago.  His smokeless tobacco use included chew. Social: [name]  reports that he quit smoking about 23  months ago. His smoking use included cigarettes. He has a 10.00 pack-year smoking history. He quit smokeless tobacco use about 4 years ago.  His smokeless tobacco use included chew. He reports that he does not drink alcohol and does not use drugs. Medical:  has a past medical history of Arthritis, COPD (chronic obstructive pulmonary disease) ([place]), Diabetes mellitus without complication ([place]), GERD (gastroesophageal reflux disease), Hyperlipidemia, Hypertension, and Neck pain (1989). Surgical: Mr. Stave  has a past surgical history that includes Neck surgery (1989); Knee surgery (Right); Colonoscopy (N/A, 06/02/2014); Polypectomy (06/02/2014); Appendectomy; Esophagogastroduodenoscopy (egd) with propofol (N/A, 07/29/2016); Back surgery; Anterior cervical corpectomy (N/A, 10/10/2018); Hernia repair (over 10 years ago); Anterior cervical corpectomy (N/A, 05/01/2019); and Posterior cervical fusion/foraminotomy (N/A, 05/01/2019). Family: family history includes Alcohol abuse in his father; COPD in his father; Cancer in his mother; Diabetes in his brother, father, and paternal grandmother.  Laboratory Chemistry Profile   Renal Lab Results  Component Value Date   BUN 8 01/07/2021   CREATININE 0.78 01/07/2021   BCR 10 01/07/2021   GFRAA 114 12/16/2019   GFRNONAA >60  10/09/2020     Hepatic Lab Results  Component Value Date   AST 22 01/07/2021   ALT 23 01/07/2021   ALBUMIN 4.5 01/07/2021   ALKPHOS 69 01/07/2021   HCVAB NON REACTIVE 03/24/2020     Electrolytes Lab Results  Component Value Date   NA 136 01/07/2021   K 4.1 01/07/2021   CL 97 01/07/2021   CALCIUM 9.2 01/07/2021     Bone Lab Results  Component Value Date   VD25OH 24.7 (L) 01/07/2021     Inflammation (CRP: Acute Phase) (ESR: Chronic Phase) Lab Results  Component Value Date   CRP 0.7 05/26/2020   ESRSEDRATE 9 10/09/2020       Note: Above Lab results reviewed.  Recent Imaging Review  DG Chest 2 View CLINICAL DATA:   History of recent COVID-19 infection  EXAM: CHEST - 2 VIEW  COMPARISON:  04/24/2017  FINDINGS: Cardiac shadow is within normal limits. Patchy airspace opacities are noted in the bases bilaterally consistent with the given clinical history. No sizable effusion is seen. No acute bony abnormality is noted. Postsurgical changes of the cervical spine are noted.  IMPRESSION: Patchy airspace opacities in the bases bilaterally consistent with the given clinical history.  Electronically Signed   By: Inez Catalina M.D.   On: 01/13/2020 16:43  Note: Reviewed        Physical Exam  General appearance: Well nourished, well developed, and well hydrated. In no apparent acute distress Mental status: Alert, oriented x 3 (person, place, & time)       Respiratory: No evidence of acute respiratory distress Eyes: PERLA Vitals: BP 139/76 (BP Location: Right Arm, Patient Position: Sitting, Cuff Size: Large)    Pulse 81    Temp 97.9 F (36.6 C)    Ht 5' 10" (1.778 m)    Wt 240 lb (108.9 kg)    SpO2 98%    BMI 34.44 kg/m  BMI: Estimated body mass index is 34.44 kg/m as calculated from the following:   Height as of this encounter: 5' 10" (1.778 m).   Weight as of this encounter: 240 lb (108.9 kg). Ideal: Ideal body weight: 73 kg (160 lb 15 oz) Adjusted ideal body weight: 87.3 kg (192 lb 9 oz)  Limited range of motion with cervical extension.  Tenderness to palpation along trapezius and deltoid. Neuropathic pain pattern of upper extremity, persistent Neuropathic pain of bilateral lower extremity   Assessment   Status Diagnosis  Controlled Controlled Controlled 1. Cervical fusion syndrome   2. Cervical radicular pain   3. S/P cervical spinal fusion   4. Chronic pain syndrome   5. Cervicalgia         Plan of Care   Mr. OMARION MINNEHAN has a current medication list which includes the following long-term medication(s): albuterol, ezetimibe, fluticasone, lisinopril, metformin, omega-3  acid ethyl esters, and pregabalin.  Pharmacotherapy (Medications Ordered): Meds ordered this encounter  Medications   HYDROcodone-acetaminophen (NORCO) 7.5-325 MG tablet    Sig: Take 1 tablet by mouth every 6 (six) hours as needed for moderate pain.    Dispense:  120 tablet    Refill:  0   HYDROcodone-acetaminophen (NORCO) 7.5-325 MG tablet    Sig: Take 1 tablet by mouth every 6 (six) hours as needed for moderate pain.    Dispense:  120 tablet    Refill:  0   HYDROcodone-acetaminophen (NORCO) 7.5-325 MG tablet    Sig: Take 1 tablet by mouth every 6 (six) hours as needed  for moderate pain.    Dispense:  120 tablet    Refill:  0    Orders Placed This Encounter  Procedures   TRIGGER POINT INJECTION    Standing Status:   Future    Standing Expiration Date:   05/16/2021    Scheduling Instructions:     cervicalgia    Order Specific Question:   Where will this procedure be performed?    Answer:   [facility]   [employer] Select 13 (MW), Urine    Volume: 30 ml(s). Minimum 3 ml of urine is needed. Document temperature of fresh sample. Indications: Long term (current) use of opiate analgesic (Z00.923)    Order Specific Question:   Release to patient    Answer:   Immediate   Continue Lyrica as prescribed, no refills needed Emphasized importance of better blood glucose management. Discussed Qutenza, capsaicin 8% treatment for painful diabetic neuropathy of lower extremity.  Follow-up plan:   Return in about 8 days (around 02/24/2021) for TPI (cervical) .   Recent Visits No visits were found meeting these conditions. Showing recent visits within past 90 days and meeting all other requirements Today's Visits Date Type Provider Dept  02/16/21 Office Visit Gillis Santa, MD [facility]  Showing today's visits and meeting all other requirements Future Appointments No visits were found meeting these conditions. Showing future appointments within next 90 days and meeting  all other requirements  I discussed the assessment and treatment plan with the patient. The patient was provided an opportunity to ask questions and all were answered. The patient agreed with the plan and demonstrated an understanding of the instructions.  Patient advised to call back or seek an in-person evaluation if the symptoms or condition worsens.  Duration of encounter: 32mnutes.  Note by: BGillis Santa MD Date: 02/16/2021; Time: 8:27 AM

## 2021-02-16 NOTE — Progress Notes (Signed)
Nursing Pain Medication Assessment:  Safety precautions to be maintained throughout the outpatient stay will include: orient to surroundings, keep bed in low position, maintain call bell within reach at all times, provide assistance with transfer out of bed and ambulation.  Medication Inspection Compliance: Pill count conducted under aseptic conditions, in front of the patient. Neither the pills nor the bottle was removed from the patient's sight at any time. Once count was completed pills were immediately returned to the patient in their original bottle.  Medication: Hydrocodone/APAP Pill/Patch Count:  47 of 120 pills remain Pill/Patch Appearance: Markings consistent with prescribed medication Bottle Appearance: Standard pharmacy container. Clearly labeled. Filled Date: 01 / 19 / 2023 Last Medica19on intake:  Today

## 2021-02-19 ENCOUNTER — Other Ambulatory Visit: Payer: Self-pay

## 2021-02-19 LAB — TOXASSURE SELECT 13 (MW), URINE

## 2021-02-19 MED ORDER — FOLIC ACID 1 MG PO TABS
ORAL_TABLET | ORAL | 3 refills | Status: DC
  Filled 2021-02-19 – 2021-06-21 (×2): qty 90, 90d supply, fill #0
  Filled 2021-09-19: qty 90, 90d supply, fill #1
  Filled 2021-12-26: qty 90, 90d supply, fill #2

## 2021-02-24 ENCOUNTER — Other Ambulatory Visit: Payer: Self-pay

## 2021-02-24 ENCOUNTER — Encounter: Payer: Self-pay | Admitting: Student in an Organized Health Care Education/Training Program

## 2021-02-24 ENCOUNTER — Ambulatory Visit
Payer: 59 | Attending: Student in an Organized Health Care Education/Training Program | Admitting: Student in an Organized Health Care Education/Training Program

## 2021-02-24 VITALS — BP 145/88 | HR 92 | Temp 98.2°F | Resp 15 | Ht 71.0 in | Wt 240.0 lb

## 2021-02-24 DIAGNOSIS — G894 Chronic pain syndrome: Secondary | ICD-10-CM | POA: Diagnosis not present

## 2021-02-24 DIAGNOSIS — M542 Cervicalgia: Secondary | ICD-10-CM | POA: Insufficient documentation

## 2021-02-24 DIAGNOSIS — M5412 Radiculopathy, cervical region: Secondary | ICD-10-CM | POA: Insufficient documentation

## 2021-02-24 DIAGNOSIS — Q761 Klippel-Feil syndrome: Secondary | ICD-10-CM | POA: Insufficient documentation

## 2021-02-24 MED ORDER — ROPIVACAINE HCL 2 MG/ML IJ SOLN
INTRAMUSCULAR | Status: AC
  Filled 2021-02-24: qty 40

## 2021-02-24 MED ORDER — ROPIVACAINE HCL 2 MG/ML IJ SOLN
9.0000 mL | Freq: Once | INTRAMUSCULAR | Status: AC
  Administered 2021-02-24: 20 mL via PERINEURAL

## 2021-02-24 NOTE — Progress Notes (Signed)
PROVIDER NOTE: Interpretation of information contained herein should be left to medically-trained personnel. Specific patient instructions are provided elsewhere under "Patient Instructions" section of medical record. This document was created in part using STT-dictation technology, any transcriptional errors that may result from this process are unintentional.  Patient: Tony Cardenas Type: Established DOB: December 14, 1970 MRN: [id number] PCP: Valerie Roys, DO  Service: Procedure DOS: 02/24/2021 Setting: Ambulatory Location: Ambulatory outpatient facility Delivery: Face-to-face Provider: Gillis Santa, MD Specialty: Interventional Pain Management Specialty designation: 09 Location: Outpatient facility Ref. Prov.: Valerie Roys, DO    Primary Reason for Visit: Interventional Pain Management Treatment. CC: Neck Pain    Procedure:          Anesthesia, Analgesia, Anxiolysis:  Type: Trigger Point Injection (3+ muscle groups)          CPT: 20553 Primary Purpose: Diagnostic Cervical, Trapezius TPI Target Area: Trigger Point Approach: Percutaneous, ipsilateral approach. Laterality: Midline        Type: Local Anesthesia Local Anesthetic: Lidocaine 1-2% Sedation: None  Indication(s):  Analgesia Route: Infiltration ([place]/IM) IV Access: N/A      1. Cervical fusion syndrome   2. Cervical radicular pain   3. Cervicalgia   4. Chronic pain syndrome    NAS-11 Pain score:   Pre-procedure: 8 /10   Post-procedure: 8 /10     Pre-op H&P Assessment:  [name] is a 51 y.o. (year old), male patient, seen today for interventional treatment. He  has a past surgical history that includes Neck surgery (1989); Knee surgery (Right); Colonoscopy (N/A, 06/02/2014); Polypectomy (06/02/2014); Appendectomy; Esophagogastroduodenoscopy (egd) with propofol (N/A, 07/29/2016); Back surgery; Anterior cervical corpectomy (N/A, 10/10/2018); Hernia repair (over 10 years ago); Anterior cervical corpectomy (N/A,  05/01/2019); and Posterior cervical fusion/foraminotomy (N/A, 05/01/2019). [name] has a current medication list which includes the following prescription(s): albuterol, blood glucose meter kit and supplies, empagliflozin, ezetimibe, fluticasone, folic acid, folic acid, [START ON 04/18/8117] hydrocodone-acetaminophen, [START ON 03/29/2021] hydrocodone-acetaminophen, [START ON 04/28/2021] hydrocodone-acetaminophen, lisinopril, metformin, methocarbamol, methotrexate, omega-3 acid ethyl esters, pregabalin, and vitamin d (ergocalciferol). His primarily concern today is the Neck Pain  Initial Vital Signs:  Pulse/HCG Rate: 92  Temp: 98.2 F (36.8 C) Resp: 15 BP: 135/69 SpO2: 97 %  BMI: Estimated body mass index is 33.47 kg/m as calculated from the following:   Height as of this encounter: _0  (1.803 m).   Weight as of this encounter: 240 lb (108.9 kg).  Risk Assessment: Allergies: Reviewed. He is allergic to atorvastatin and hydromorphone hcl.  Allergy Precautions: None required Coagulopathies: Reviewed. None identified.  Blood-thinner therapy: None at this time Active Infection(s): Reviewed. None identified. [name] is afebrile  Site Confirmation: [name] was asked to confirm the procedure and laterality before marking the site Procedure checklist: Completed Consent: Before the procedure and under the influence of no sedative(s), amnesic(s), or anxiolytics, the patient was informed of the treatment options, risks and possible complications. To fulfill our ethical and legal obligations, as recommended by the American Medical Association's Code of Ethics, I have informed the patient of my clinical impression; the nature and purpose of the treatment or procedure; the risks, benefits, and possible complications of the intervention; the alternatives, including doing nothing; the risk(s) and benefit(s) of the alternative treatment(s) or procedure(s); and the risk(s) and benefit(s) of doing  nothing. The patient was provided information about the general risks and possible complications associated with the procedure. These may include, but are not limited to: failure to achieve desired goals, infection,  bleeding, organ or nerve damage, allergic reactions, paralysis, and death. In addition, the patient was informed of those risks and complications associated to the procedure, such as failure to decrease pain; infection; bleeding; organ or nerve damage with subsequent damage to sensory, motor, and/or autonomic systems, resulting in permanent pain, numbness, and/or weakness of one or several areas of the body; allergic reactions; (i.e.: anaphylactic reaction); and/or death. Furthermore, the patient was informed of those risks and complications associated with the medications. These include, but are not limited to: allergic reactions (i.e.: anaphylactic or anaphylactoid reaction(s)); adrenal axis suppression; blood sugar elevation that in diabetics may result in ketoacidosis or comma; water retention that in patients with history of congestive heart failure may result in shortness of breath, pulmonary edema, and decompensation with resultant heart failure; weight gain; swelling or edema; medication-induced neural toxicity; particulate matter embolism and blood vessel occlusion with resultant organ, and/or nervous system infarction; and/or aseptic necrosis of one or more joints. Finally, the patient was informed that Medicine is not an exact science; therefore, there is also the possibility of unforeseen or unpredictable risks and/or possible complications that may result in a catastrophic outcome. The patient indicated having understood very clearly. We have given the patient no guarantees and we have made no promises. Enough time was given to the patient to ask questions, all of which were answered to the patient's satisfaction. [name] has indicated that he wanted to continue with the  procedure. Attestation: I, the ordering provider, attest that I have discussed with the patient the benefits, risks, side-effects, alternatives, likelihood of achieving goals, and potential problems during recovery for the procedure that I have provided informed consent. Date   Time: 02/24/2021  1:47 PM  Pre-Procedure Preparation:  Monitoring: As per clinic protocol. Respiration, ETCO2, SpO2, BP, heart rate and rhythm monitor placed and checked for adequate function Safety Precautions: Patient was assessed for positional comfort and pressure points before starting the procedure. Time-out: I initiated and conducted the "Time-out" before starting the procedure, as per protocol. The patient was asked to participate by confirming the accuracy of the "Time Out" information. Verification of the correct person, site, and procedure were performed and confirmed by me, the nursing staff, and the patient. "Time-out" conducted as per Joint Commission's Universal Protocol (UP.01.01.01). Time: 1412  Description of Procedure:          Area Prepped: Entire             Region DuraPrep (Iodine Povacrylex [0.7% available iodine] and Isopropyl Alcohol, 74% w/w) Safety Precautions: Aspiration looking for blood return was conducted prior to all injections. At no point did we inject any substances, as a needle was being advanced. No attempts were made at seeking any paresthesias. Safe injection practices and needle disposal techniques used. Medications properly checked for expiration dates. SDV (single dose vial) medications used. Description of the Procedure: Protocol guidelines were followed. The patient was placed in position over the fluoroscopy table. The target area was identified and the area prepped in the usual manner. Skin & deeper tissues infiltrated with local anesthetic. Appropriate amount of time allowed to pass for local anesthetics to take effect. The procedure needles were then advanced to the target area.  Proper needle placement secured. Negative aspiration confirmed. Solution injected in intermittent fashion, asking for systemic symptoms every 0.5cc of injectate. The needles were then removed and the area cleansed, making sure to leave some of the prepping solution back to take advantage of its long term bactericidal properties.  Vitals:   02/24/21 1354 02/24/21 1420  BP: 135/69 (!) 145/88  Pulse: 92   Resp: 15   Temp: 98.2 F (36.8 C)   TempSrc: Temporal   SpO2: 97%   Weight: 240 lb (108.9 kg)   Height: _0  (1.803 m)     Start Time: 1413 hrs. End Time: 1417 hrs. Materials:     Approximately 10 trigger points injected on each side with dry needling performed.  Each trigger point was injected with 0.5 to 1 cc of 0.2% Ropivacaine with dry needling performed.  Post-operative Assessment:  Post-procedure Vital Signs:  Pulse/HCG Rate: 92  Temp: 98.2 F (36.8 C) Resp: 15 BP: (!) 145/88 SpO2: 97 %  EBL: None  Complications: No immediate post-treatment complications observed by team, or reported by patient.  Note: The patient tolerated the entire procedure well. A repeat set of vitals were taken after the procedure and the patient was kept under observation following institutional policy, for this type of procedure. Post-procedural neurological assessment was performed, showing return to baseline, prior to discharge. The patient was provided with post-procedure discharge instructions, including a section on how to identify potential problems. Should any problems arise concerning this procedure, the patient was given instructions to immediately contact us, at any time, without hesitation. In any case, we plan to contact the patient by telephone for a follow-up status report regarding this interventional procedure.  Comments:  No additional relevant information.  Plan of Care    Chronic Opioid Analgesic:  Hydrocodone 7.5 mg QID PRN   Medications ordered for procedure: Meds  ordered this encounter  Medications   ropivacaine (PF) 2 mg/mL (0.2%) (NAROPIN) injection 9 mL   Medications administered: We administered ropivacaine (PF) 2 mg/mL (0.2%).  See the medical record for exact dosing, route, and time of administration.  Follow-up plan:   Return in about 4 weeks (around 03/24/2021) for VIRTUAL follow up and med refill.      Recent Visits Date Type Provider Dept  02/16/21 Office Visit Gillis Santa, MD [facility]  Showing recent visits within past 90 days and meeting all other requirements Today's Visits Date Type Provider Dept  02/24/21 Procedure visit Gillis Santa, MD [facility]  Showing today's visits and meeting all other requirements Future Appointments Date Type Provider Dept  03/25/21 Appointment Gillis Santa, MD [facility]  05/11/21 Appointment Gillis Santa, MD [facility]  Showing future appointments within next 90 days and meeting all other requirements  Disposition: Discharge home  Discharge (Date   Time): 02/24/2021; 1424 hrs.   Primary Care Physician: Valerie Roys, DO Location: [facility] Note by: Gillis Santa, MD Date: 02/24/2021; Time: 2:32 PM  Disclaimer:  Medicine is not an exact science. The only guarantee in medicine is that nothing is guaranteed. It is important to note that the decision to proceed with this intervention was based on the information collected from the patient. The Data and conclusions were drawn from the patient's questionnaire, the interview, and the physical examination. Because the information was provided in large part by the patient, it cannot be guaranteed that it has not been purposely or unconsciously manipulated. Every effort has been made to obtain as much relevant data as possible for this evaluation. It is important to note that the conclusions that lead to this procedure are derived in large part from the available data. Always  take into account that the treatment will also be dependent on availability of resources and  existing treatment guidelines, considered by other Pain Management Practitioners as being common knowledge and practice, at the time of the intervention. For Medico-Legal purposes, it is also important to point out that variation in procedural techniques and pharmacological choices are the acceptable norm. The indications, contraindications, technique, and results of the above procedure should only be interpreted and judged by a Board-Certified Interventional Pain Specialist with extensive familiarity and expertise in the same exact procedure and technique.

## 2021-02-24 NOTE — Progress Notes (Signed)
Safety precautions to be maintained throughout the outpatient stay will include: orient to surroundings, keep bed in low position, maintain call bell within reach at all times, provide assistance with transfer out of bed and ambulation.  

## 2021-02-25 ENCOUNTER — Telehealth: Payer: Self-pay | Admitting: *Deleted

## 2021-02-25 NOTE — Telephone Encounter (Signed)
Post procedure call:   no  questions or concerns.  

## 2021-02-28 ENCOUNTER — Other Ambulatory Visit: Payer: Self-pay

## 2021-03-01 ENCOUNTER — Other Ambulatory Visit: Payer: Self-pay

## 2021-03-01 MED ORDER — METHOTREXATE 2.5 MG PO TABS
ORAL_TABLET | ORAL | 3 refills | Status: DC
  Filled 2021-03-01: qty 20, 28d supply, fill #0
  Filled 2021-03-28: qty 20, 28d supply, fill #1
  Filled 2021-04-25: qty 20, 28d supply, fill #2
  Filled 2021-05-23: qty 20, 28d supply, fill #3

## 2021-03-01 MED ORDER — FOLIC ACID 1 MG PO TABS
ORAL_TABLET | ORAL | 3 refills | Status: DC
  Filled 2021-03-01: qty 90, 90d supply, fill #0

## 2021-03-03 ENCOUNTER — Other Ambulatory Visit: Payer: Self-pay

## 2021-03-14 ENCOUNTER — Other Ambulatory Visit: Payer: Self-pay | Admitting: Family Medicine

## 2021-03-15 ENCOUNTER — Other Ambulatory Visit: Payer: Self-pay

## 2021-03-16 ENCOUNTER — Other Ambulatory Visit: Payer: Self-pay

## 2021-03-16 MED ORDER — VITAMIN D (ERGOCALCIFEROL) 1.25 MG (50000 UNIT) PO CAPS
ORAL_CAPSULE | ORAL | 1 refills | Status: DC
  Filled 2021-03-16: qty 12, 84d supply, fill #0
  Filled 2021-06-06: qty 12, 84d supply, fill #1

## 2021-03-16 NOTE — Telephone Encounter (Signed)
Requested medication (s) are due for refill today:   Provider to review ? ?Requested medication (s) are on the active medication list:   Yes ? ?Future visit scheduled:   Yes ? ? ?Last ordered: 09/29/2020 - 9/202023 #12, 1 refil ? ?Returned because this is a non delegated refill.  ? ?Requested Prescriptions  ?Pending Prescriptions Disp Refills  ? Vitamin D, Ergocalciferol, (DRISDOL) 1.25 MG (50000 UNIT) CAPS capsule 12 capsule 1  ?  Sig: TAKE 1 CAPSULE BY MOUTH ONCE A WEEK FOR 12 WEEKS  ?  ? Endocrinology:  Vitamins - Vitamin D Supplementation 2 Failed - 03/14/2021 10:34 AM  ?  ?  Failed - Manual Review: Route requests for 50,000 IU strength to the provider  ?  ?  Failed - Vitamin D in normal range and within 360 days  ?  Vit D, 25-Hydroxy  ?Date Value Ref Range Status  ?01/07/2021 24.7 (L) 30.0 - 100.0 ng/mL Final  ?  Comment:  ?  Vitamin D deficiency has been defined by the Institute of ?Medicine and an Endocrine Society practice guideline as a ?level of serum 25-OH vitamin D less than 20 ng/mL (1,2). ?The Endocrine Society went on to further define vitamin D ?insufficiency as a level between 21 and 29 ng/mL (2). ?1. IOM (Institute of Medicine). 2010. Dietary reference ?   intakes for calcium and D. [place]: The ?   [employer]. ?2. Holick MF, Binkley [place], Bischoff-Ferrari HA, et al. ?   Evaluation, treatment, and prevention of vitamin D ?   deficiency: an Endocrine Society clinical practice ?   guideline. JCEM. 2011 Jul; 96(7):1911-30. ?  ?  ?  ?  ?  Passed - Ca in normal range and within 360 days  ?  Calcium  ?Date Value Ref Range Status  ?01/07/2021 9.2 8.7 - 10.2 mg/dL Final  ? ?Calcium, Total  ?Date Value Ref Range Status  ?02/11/2013 8.1 (L) 8.5 - 10.1 mg/dL Final  ?  ?  ?  ?  Passed - Valid encounter within last 12 months  ?  Recent Outpatient Visits   ? ?      ? 2 months ago Type 2 diabetes mellitus with hyperglycemia, without long-term current use of insulin ([place])  ? [place], DO  ? 8 months ago Routine general medical examination at a health care facility  ? [place], [place] P, DO  ? 1 year ago Type 2 diabetes mellitus with hyperglycemia, without long-term current use of insulin ([place])  ? [place], Megan P, DO  ? 1 year ago COVID-19  ? [place], Megan P, DO  ? 1 year ago Primary hypertension  ? [place], [place] P, DO  ? ?  ?  ?Future Appointments   ? ?        ? In 3 weeks Wynetta Emery, Barb Merino, DO [place]  ? ?  ? ?  ?  ?  ? ?

## 2021-03-22 ENCOUNTER — Telehealth: Payer: Self-pay | Admitting: Gastroenterology

## 2021-03-22 NOTE — Telephone Encounter (Signed)
Pt wants to cancel colonoscopy procedure and will call to resched. Unable to take the time off work. ?

## 2021-03-24 ENCOUNTER — Encounter: Payer: Self-pay | Admitting: Student in an Organized Health Care Education/Training Program

## 2021-03-25 ENCOUNTER — Other Ambulatory Visit: Payer: Self-pay

## 2021-03-25 ENCOUNTER — Encounter: Payer: Self-pay | Admitting: Student in an Organized Health Care Education/Training Program

## 2021-03-25 ENCOUNTER — Ambulatory Visit
Payer: 59 | Attending: Student in an Organized Health Care Education/Training Program | Admitting: Student in an Organized Health Care Education/Training Program

## 2021-03-25 ENCOUNTER — Encounter: Admission: RE | Payer: Self-pay | Source: Home / Self Care

## 2021-03-25 ENCOUNTER — Ambulatory Visit: Admission: RE | Admit: 2021-03-25 | Payer: 59 | Source: Home / Self Care | Admitting: Gastroenterology

## 2021-03-25 DIAGNOSIS — G894 Chronic pain syndrome: Secondary | ICD-10-CM | POA: Diagnosis not present

## 2021-03-25 DIAGNOSIS — M542 Cervicalgia: Secondary | ICD-10-CM | POA: Diagnosis not present

## 2021-03-25 DIAGNOSIS — Q761 Klippel-Feil syndrome: Secondary | ICD-10-CM | POA: Diagnosis not present

## 2021-03-25 DIAGNOSIS — Z981 Arthrodesis status: Secondary | ICD-10-CM | POA: Diagnosis not present

## 2021-03-25 DIAGNOSIS — M5412 Radiculopathy, cervical region: Secondary | ICD-10-CM | POA: Diagnosis not present

## 2021-03-25 SURGERY — COLONOSCOPY WITH PROPOFOL
Anesthesia: General

## 2021-03-25 NOTE — Progress Notes (Signed)
Patient: Tony Cardenas  Service Category: E/M  Provider:  , MD  ?DOB: 02/19/1970  DOS: 03/25/2021  Location: Office  ?MRN: [id number]  Setting: Ambulatory outpatient  Referring Provider: [place], Megan P, DO  ?Type: Established Patient  Specialty: Interventional Pain Management  PCP: Johnson, Megan P, DO  ?Location: Remote location  Delivery: TeleHealth    ? ?Virtual Encounter - Pain Management ?PROVIDER NOTE: Information contained herein reflects review and annotations entered in association with encounter. Interpretation of such information and data should be left to medically-trained personnel. Information provided to patient can be located elsewhere in the medical record under "Patient Instructions". Document created using STT-dictation technology, any transcriptional errors that may result from process are unintentional.  ?  ?Contact & Pharmacy ?Preferred: [phone] ?Home: [phone] (home) ?Mobile: [phone] (mobile) ?E-mail: [email or web address]  ?[facility] ?[address] ?[place] [address] ?Phone: [phone] Fax: [phone] ?  ?Pre-screening  ?Tony Cardenas offered "in-person" vs "virtual" encounter. He indicated preferring virtual for this encounter.  ? ?Reason ?COVID-19*  Social distancing based on CDC and AMA recommendations.  ? ?I contacted Tony Cardenas on 03/25/2021 via telephone.      I clearly identified myself as  , MD. I verified that I was speaking with the correct person using two identifiers (Name: Tony Cardenas, and date of birth: 03/25/1970). ? ?Consent ?I sought verbal advanced consent from Tony Cardenas for virtual visit interactions. I informed Tony Cardenas of possible security and privacy concerns, risks, and limitations associated with providing "not-in-person" medical evaluation and management services. I also informed Tony Cardenas of the availability of "in-person" appointments. Finally, I  informed him that there would be a charge for the virtual visit and that he could be  personally, fully or partially, financially responsible for it. Tony Cardenas expressed understanding and agreed to proceed.  ? ?Historic Elements   ?Tony Cardenas is a 51 y.o. year old, male patient evaluated today after our last contact on 02/24/2021. Tony Cardenas  has a past medical history of Arthritis, COPD (chronic obstructive pulmonary disease) (HCC), Diabetes mellitus without complication (HCC), GERD (gastroesophageal reflux disease), Hyperlipidemia, Hypertension, and Neck pain (1989). He also  has a past surgical history that includes Neck surgery (1989); Knee surgery (Right); Colonoscopy (N/A, 06/02/2014); Polypectomy (06/02/2014); Appendectomy; Esophagogastroduodenoscopy (egd) with propofol (N/A, 07/29/2016); Back surgery; Anterior cervical corpectomy (N/A, 10/10/2018); Hernia repair (over 10 years ago); Anterior cervical corpectomy (N/A, 05/01/2019); and Posterior cervical fusion/foraminotomy (N/A, 05/01/2019). Tony Cardenas has a current medication list which includes the following prescription(s): albuterol, blood glucose meter kit and supplies, empagliflozin, ezetimibe, fluticasone, folic acid, folic acid, hydrocodone-acetaminophen, [START ON 03/29/2021] hydrocodone-acetaminophen, [START ON 04/28/2021] hydrocodone-acetaminophen, lisinopril, metformin, methocarbamol, methotrexate, omega-3 acid ethyl esters, pregabalin, and vitamin d (ergocalciferol). He  reports that he quit smoking about 2 years ago. His smoking use included cigarettes. He has a 10.00 pack-year smoking history. He quit smokeless tobacco use about 5 years ago.  His smokeless tobacco use included chew. He reports that he does not drink alcohol and does not use drugs. Tony Cardenas is allergic to atorvastatin and hydromorphone hcl.  ? ?HPI  ?Today, he is being contacted for a post-procedure assessment. ? ? ?Post-procedure evaluation  ?   ?Procedure:           Anesthesia, Analgesia, Anxiolysis:  ?Type: Trigger Point Injection (3+ muscle groups)          ?CPT: 20553 ?Primary Purpose: Diagnostic ?Cervical, Trapezius TPI ?Target Area: Trigger Point ?  Approach: Percutaneous, ipsilateral approach. ?Laterality: Midline        Type: Local Anesthesia ?Local Anesthetic: Lidocaine 1-2% ?Sedation: None  ?Indication(s):  Analgesia ?Route: Infiltration ([place]/IM) ?IV Access: N/A ? ? ?  ? ?1. Cervical fusion syndrome   ?2. Cervical radicular pain   ?3. Cervicalgia   ?4. Chronic pain syndrome   ? ?NAS-11 Pain score:  ? Pre-procedure: 8 /10  ? Post-procedure: 8 /10  ? ?   ?Effectiveness:  ?Initial hour after procedure: 50 %  ?Subsequent 4-6 hours post-procedure: 50 %  ?Analgesia past initial 6 hours: 50 %  ?Ongoing improvement:  ?Analgesic:  50%-60% ?Function: Tony Cardenas reports improvement in function ?ROM: Tony Cardenas reports improvement in ROM ? ? ?Repeat PRN ? ?Pharmacotherapy Assessment  ? ?Opioid Analgesic: Hydrocodone 7.5 mg QID PRN  ? ?Monitoring: ?[place] PMP: PDMP reviewed during this encounter.       ?Pharmacotherapy: No side-effects or adverse reactions reported. ?Compliance: No problems identified. ?Effectiveness: Clinically acceptable. ?Plan: Refer to "POC". UDS:  ?Summary  ?Date Value Ref Range Status  ?02/16/2021 Note  Final  ?  Comment:  ?  ==================================================================== ?[employer] Select 13 (MW) ?==================================================================== ?Test                             Result       Flag       Units ? ?Drug Present and Declared for Prescription Verification ?  Hydrocodone                    540          EXPECTED   ng/mg creat ?  Hydromorphone                  604          EXPECTED   ng/mg creat ?  Dihydrocodeine                 192          EXPECTED   ng/mg creat ?  Norhydrocodone                 322          EXPECTED   ng/mg creat ?   Sources of hydrocodone include scheduled prescription medications. ?    Hydromorphone, dihydrocodeine and norhydrocodone are expected ?   metabolites of hydrocodone. Hydromorphone and dihydrocodeine are ?   also available as scheduled prescription medications. ? ?==================================================================== ?Test                      Result    Flag   Units      Ref Range ?  Creatinine              77               mg/dL      >=20 ?==================================================================== ?Declared Medications: ? The flagging and interpretation on this report are based on the ? following declared medications.  Unexpected results may arise from ? inaccuracies in the declared medications. ? ? **Note: The testing scope of this panel includes these medications: ? ? Hydrocodone (Norco) ? ? **Note: The testing scope of this panel does not include the ? following reported medications: ? ? Acetaminophen (Norco) ? Albuterol (Ventolin HFA) ? Empagliflozin ([place]) ? Ezetimibe (Zetia) ? Fluticasone (Flonase) ? Folic Acid ? Lisinopril (Zestril) ? Metformin ? Methocarbamol (Robaxin) ?  Methotrexate ? Omega-3 Fatty Acids ? Pregabalin (Lyrica) ? Vitamin D2 (Drisdol) ?==================================================================== ?For clinical consultation, please call [phone]. ?==================================================================== ?  ?  ? ?Laboratory Chemistry Profile  ? ?Renal ?Lab Results  ?Component Value Date  ? BUN 8 01/07/2021  ? CREATININE 0.78 01/07/2021  ? BCR 10 01/07/2021  ? GFRAA 114 12/16/2019  ? GFRNONAA >60 10/09/2020  ?  Hepatic ?Lab Results  ?Component Value Date  ? AST 22 01/07/2021  ? ALT 23 01/07/2021  ? ALBUMIN 4.5 01/07/2021  ? ALKPHOS 69 01/07/2021  ? HCVAB NON REACTIVE 03/24/2020  ?  ?Electrolytes ?Lab Results  ?Component Value Date  ? NA 136 01/07/2021  ? K 4.1 01/07/2021  ? CL 97 01/07/2021  ? CALCIUM 9.2 01/07/2021  ?  Bone ?Lab Results  ?Component Value Date  ? VD25OH 24.7 (L) 01/07/2021  ?  ?Inflammation (CRP:  Acute Phase) (ESR: Chronic Phase) ?Lab Results  ?Component Value Date  ? CRP 0.7 05/26/2020  ? ESRSEDRATE 9 10/09/2020  ?    ?  ? ?Note: Above Lab results reviewed. ? ?Imaging  ?DG Chest 2 View ?CLINICAL DATA

## 2021-03-29 ENCOUNTER — Other Ambulatory Visit: Payer: Self-pay

## 2021-04-09 ENCOUNTER — Ambulatory Visit: Payer: 59 | Admitting: Family Medicine

## 2021-04-13 ENCOUNTER — Other Ambulatory Visit: Payer: Self-pay

## 2021-04-13 MED ORDER — AMOXICILLIN-POT CLAVULANATE 500-125 MG PO TABS
1.0000 | ORAL_TABLET | Freq: Three times a day (TID) | ORAL | 0 refills | Status: DC
  Filled 2021-04-13: qty 21, 7d supply, fill #0

## 2021-04-19 ENCOUNTER — Other Ambulatory Visit: Payer: Self-pay

## 2021-04-21 ENCOUNTER — Other Ambulatory Visit: Payer: Self-pay

## 2021-04-26 ENCOUNTER — Other Ambulatory Visit: Payer: Self-pay

## 2021-04-28 ENCOUNTER — Other Ambulatory Visit: Payer: Self-pay

## 2021-05-11 ENCOUNTER — Ambulatory Visit: Payer: 59 | Admitting: Family Medicine

## 2021-05-11 ENCOUNTER — Ambulatory Visit (HOSPITAL_BASED_OUTPATIENT_CLINIC_OR_DEPARTMENT_OTHER): Payer: 59 | Admitting: Student in an Organized Health Care Education/Training Program

## 2021-05-11 ENCOUNTER — Encounter: Payer: Self-pay | Admitting: Family Medicine

## 2021-05-11 ENCOUNTER — Encounter: Payer: Self-pay | Admitting: Student in an Organized Health Care Education/Training Program

## 2021-05-11 ENCOUNTER — Other Ambulatory Visit: Payer: Self-pay

## 2021-05-11 ENCOUNTER — Other Ambulatory Visit
Admission: RE | Admit: 2021-05-11 | Discharge: 2021-05-11 | Disposition: A | Discharge status: Home / Self Care | Payer: 59 | Attending: Rheumatology | Admitting: Rheumatology

## 2021-05-11 VITALS — BP 126/69 | HR 78 | Temp 98.0°F | Wt 245.0 lb

## 2021-05-11 VITALS — BP 122/73 | HR 81 | Temp 98.2°F | Resp 18 | Ht 70.0 in | Wt 245.0 lb

## 2021-05-11 DIAGNOSIS — Z885 Allergy status to narcotic agent status: Secondary | ICD-10-CM | POA: Diagnosis not present

## 2021-05-11 DIAGNOSIS — K219 Gastro-esophageal reflux disease without esophagitis: Secondary | ICD-10-CM

## 2021-05-11 DIAGNOSIS — J449 Chronic obstructive pulmonary disease, unspecified: Secondary | ICD-10-CM

## 2021-05-11 DIAGNOSIS — M542 Cervicalgia: Secondary | ICD-10-CM

## 2021-05-11 DIAGNOSIS — Z87891 Personal history of nicotine dependence: Secondary | ICD-10-CM | POA: Diagnosis not present

## 2021-05-11 DIAGNOSIS — R7989 Other specified abnormal findings of blood chemistry: Secondary | ICD-10-CM

## 2021-05-11 DIAGNOSIS — M4722 Other spondylosis with radiculopathy, cervical region: Secondary | ICD-10-CM | POA: Diagnosis not present

## 2021-05-11 DIAGNOSIS — E559 Vitamin D deficiency, unspecified: Secondary | ICD-10-CM

## 2021-05-11 DIAGNOSIS — E119 Type 2 diabetes mellitus without complications: Secondary | ICD-10-CM | POA: Insufficient documentation

## 2021-05-11 DIAGNOSIS — Z7984 Long term (current) use of oral hypoglycemic drugs: Secondary | ICD-10-CM | POA: Diagnosis not present

## 2021-05-11 DIAGNOSIS — M0609 Rheumatoid arthritis without rheumatoid factor, multiple sites: Secondary | ICD-10-CM | POA: Insufficient documentation

## 2021-05-11 DIAGNOSIS — E1165 Type 2 diabetes mellitus with hyperglycemia: Secondary | ICD-10-CM | POA: Diagnosis not present

## 2021-05-11 DIAGNOSIS — Z79899 Other long term (current) drug therapy: Secondary | ICD-10-CM | POA: Insufficient documentation

## 2021-05-11 DIAGNOSIS — E785 Hyperlipidemia, unspecified: Secondary | ICD-10-CM | POA: Insufficient documentation

## 2021-05-11 DIAGNOSIS — G894 Chronic pain syndrome: Secondary | ICD-10-CM

## 2021-05-11 DIAGNOSIS — Z981 Arthrodesis status: Secondary | ICD-10-CM | POA: Insufficient documentation

## 2021-05-11 DIAGNOSIS — M4712 Other spondylosis with myelopathy, cervical region: Secondary | ICD-10-CM | POA: Insufficient documentation

## 2021-05-11 DIAGNOSIS — E782 Mixed hyperlipidemia: Secondary | ICD-10-CM

## 2021-05-11 DIAGNOSIS — M5412 Radiculopathy, cervical region: Secondary | ICD-10-CM

## 2021-05-11 DIAGNOSIS — Z1211 Encounter for screening for malignant neoplasm of colon: Secondary | ICD-10-CM

## 2021-05-11 DIAGNOSIS — Q761 Klippel-Feil syndrome: Secondary | ICD-10-CM | POA: Insufficient documentation

## 2021-05-11 DIAGNOSIS — M4322 Fusion of spine, cervical region: Secondary | ICD-10-CM | POA: Insufficient documentation

## 2021-05-11 DIAGNOSIS — I10 Essential (primary) hypertension: Secondary | ICD-10-CM | POA: Diagnosis not present

## 2021-05-11 LAB — CBC WITH DIFFERENTIAL/PLATELET
Abs Immature Granulocytes: 0.06 10*3/uL (ref 0.00–0.07)
Basophils Absolute: 0.1 10*3/uL (ref 0.0–0.1)
Basophils Relative: 1 %
Eosinophils Absolute: 0.2 10*3/uL (ref 0.0–0.5)
Eosinophils Relative: 2 %
HCT: 40.7 % (ref 39.0–52.0)
Hemoglobin: 14.5 g/dL (ref 13.0–17.0)
Immature Granulocytes: 1 %
Lymphocytes Relative: 43 %
Lymphs Abs: 3.4 10*3/uL (ref 0.7–4.0)
MCH: 32.4 pg (ref 26.0–34.0)
MCHC: 35.6 g/dL (ref 30.0–36.0)
MCV: 90.8 fL (ref 80.0–100.0)
Monocytes Absolute: 0.3 10*3/uL (ref 0.1–1.0)
Monocytes Relative: 4 %
Neutro Abs: 4 10*3/uL (ref 1.7–7.7)
Neutrophils Relative %: 49 %
Platelets: 356 10*3/uL (ref 150–400)
RBC: 4.48 MIL/uL (ref 4.22–5.81)
RDW: 13.2 % (ref 11.5–15.5)
WBC: 8 10*3/uL (ref 4.0–10.5)
nRBC: 0 % (ref 0.0–0.2)

## 2021-05-11 LAB — AST: AST: 29 U/L (ref 15–41)

## 2021-05-11 LAB — SEDIMENTATION RATE: Sed Rate: 9 mm/hr (ref 0–20)

## 2021-05-11 LAB — CREATININE, SERUM
Creatinine, Ser: 0.73 mg/dL (ref 0.61–1.24)
GFR, Estimated: >60 mL/min (ref >60)

## 2021-05-11 LAB — ALT: ALT: 23 U/L (ref 0–44)

## 2021-05-11 LAB — BAYER DCA HB A1C WAIVED: HB A1C (BAYER DCA - WAIVED): 7.7 % — ABNORMAL HIGH (ref 4.8–5.6)

## 2021-05-11 MED ORDER — PREVIDENT 5000 BOOSTER PLUS 1.1 % DT PSTE
PASTE | DENTAL | 3 refills | Status: DC
  Filled 2021-05-11: qty 100, 30d supply, fill #0

## 2021-05-11 MED ORDER — LISINOPRIL 5 MG PO TABS
ORAL_TABLET | Freq: Every day | ORAL | 1 refills | Status: DC
  Filled 2021-05-11 – 2021-07-14 (×2): qty 90, 90d supply, fill #0
  Filled 2021-10-10: qty 90, 90d supply, fill #1

## 2021-05-11 MED ORDER — EZETIMIBE 10 MG PO TABS
ORAL_TABLET | Freq: Every day | ORAL | 1 refills | Status: DC
  Filled 2021-05-11: qty 90, 90d supply, fill #0
  Filled 2021-08-15: qty 90, 90d supply, fill #1

## 2021-05-11 MED ORDER — HYDROCODONE-ACETAMINOPHEN 7.5-325 MG PO TABS
1.0000 | ORAL_TABLET | Freq: Four times a day (QID) | ORAL | 0 refills | Status: DC | PRN
  Filled 2021-07-27: qty 120, 30d supply, fill #0

## 2021-05-11 MED ORDER — METHOTREXATE 2.5 MG PO TABS
ORAL_TABLET | ORAL | 3 refills | Status: DC
  Filled 2021-05-11: qty 20, 28d supply, fill #0

## 2021-05-11 MED ORDER — ALBUTEROL SULFATE HFA 108 (90 BASE) MCG/ACT IN AERS
2.0000 | INHALATION_SPRAY | Freq: Four times a day (QID) | RESPIRATORY_TRACT | 3 refills | Status: AC | PRN
  Filled 2021-05-11 – 2022-04-20 (×2): qty 6.7, 25d supply, fill #0

## 2021-05-11 MED ORDER — HYDROCODONE-ACETAMINOPHEN 7.5-325 MG PO TABS
1.0000 | ORAL_TABLET | Freq: Four times a day (QID) | ORAL | 0 refills | Status: AC | PRN
  Filled 2021-06-28: qty 120, 30d supply, fill #0

## 2021-05-11 MED ORDER — HYDROCODONE-ACETAMINOPHEN 7.5-325 MG PO TABS
1.0000 | ORAL_TABLET | Freq: Four times a day (QID) | ORAL | 0 refills | Status: AC | PRN
  Filled 2021-05-28: qty 120, 30d supply, fill #0

## 2021-05-11 MED ORDER — EMPAGLIFLOZIN 25 MG PO TABS
ORAL_TABLET | Freq: Every day | ORAL | 1 refills | Status: DC
  Filled 2021-05-11: qty 90, 90d supply, fill #0
  Filled 2021-08-15: qty 90, 90d supply, fill #1

## 2021-05-11 MED ORDER — OZEMPIC (0.25 OR 0.5 MG/DOSE) 2 MG/3ML ~~LOC~~ SOPN
PEN_INJECTOR | SUBCUTANEOUS | 1 refills | Status: DC
  Filled 2021-05-11: qty 3, 28d supply, fill #0

## 2021-05-11 MED ORDER — METFORMIN HCL ER 500 MG PO TB24
ORAL_TABLET | Freq: Two times a day (BID) | ORAL | 1 refills | Status: DC
  Filled 2021-05-11 – 2021-07-14 (×2): qty 360, 90d supply, fill #0

## 2021-05-11 MED ORDER — PREGABALIN 50 MG PO CAPS
50.0000 mg | ORAL_CAPSULE | Freq: Every day | ORAL | 5 refills | Status: DC
  Filled 2021-05-11: qty 60, 30d supply, fill #0

## 2021-05-11 NOTE — Assessment & Plan Note (Signed)
Under good control on current regimen. Continue current regimen. Continue to monitor. Call with any concerns. Refills given. Labs drawn today.   

## 2021-05-11 NOTE — Assessment & Plan Note (Signed)
Doing much better with a1c of 7.7 down from 9.3. Will start ozempic with goal of getting him below 7. Continue to monitor. Recheck 3 months. Call with any concerns. Refills given.  ?

## 2021-05-11 NOTE — Assessment & Plan Note (Signed)
Rechecking labs today. Await results. Treat as needed.  °

## 2021-05-11 NOTE — Patient Instructions (Signed)
Preparing for your procedure (without sedation) ?Instructions: ?Oral Intake: Do not eat or drink anything for at least 3 hours prior to your procedure. ?Transportation: Unless otherwise stated by your physician, you may drive yourself after the procedure. ?Blood Pressure Medicine: Take your blood pressure medicine with a sip of water the morning of the procedure. ?Insulin: Take only ? of your normal insulin dose. ?Preventing infections: Shower with an antibacterial soap the morning of your procedure. ?Build-up your immune system: Take 1000 mg of Vitamin C with every meal (3 times a day) the day prior to your procedure. ?Pregnancy: If you are pregnant, call and cancel the procedure. ?Sickness: If you have a cold, fever, or any active infections, call and cancel the procedure. ?Arrival: You must be in the facility at least 30 minutes prior to your scheduled procedure. ?Children: Do not bring any children with you. ?Dress appropriately: Bring dark clothing that you would not mind if they get stained. ?Valuables: Do not bring any jewelry or valuables. ?Procedure appointments are reserved for interventional treatments only. ?No Prescription Refills. ?No medication changes will be discussed during procedure appointments. ?No disability issues will be discussed. Preparing for your procedure (without sedation) ?Instructions: ?Oral Intake: Do not eat or drink anything for at least 3 hours prior to your procedure. ?Transportation: Unless otherwise stated by your physician, you may drive yourself after the procedure. ?Blood Pressure Medicine: Take your blood pressure medicine with a sip of water the morning of the procedure. ?Insulin: Take only ? of your normal insulin dose. ?Preventing infections: Shower with an antibacterial soap the morning of your procedure. ?Build-up your immune system: Take 1000 mg of Vitamin C with every meal (3 times a day) the day prior to your procedure. ?Pregnancy: If you are pregnant, call and  cancel the procedure. ?Sickness: If you have a cold, fever, or any active infections, call and cancel the procedure. ?Arrival: You must be in the facility at least 30 minutes prior to your scheduled procedure. ?Children: Do not bring any children with you. ?Dress appropriately: Bring dark clothing that you would not mind if they get stained. ?Valuables: Do not bring any jewelry or valuables. ?Procedure appointments are reserved for interventional treatments only. ?No Prescription Refills. ?No medication changes will be discussed during procedure appointments. ?No disability issues will be discussed. Trigger Point Injections ?Patient Information ? ?Description: Trigger points are areas of muscle sensitive to touch which cause pain with movement, sometimes felt some distance from the site of palpation.  Usually the muscle containing these trigger points if felt as a tight band or knot. ?  The area of maximum tenderness or trigger point is identified, and after antiseptic preparation of the skin, a small needle is placed into this site.  Reproduction of the pain often occurs and numbing medicine (local anesthetic) is injected into the site, sometimes along with steroid preparation.  The entire block usually lasts less than 5 minutes. ? ?Conditions which may be treated by trigger points: ? ?Muscular pain and spasm ?Nerve irritation ? ?Preparation for the injection: ? ?Do not eat any solid food or dairy products within 8 hours of your appointment. ?You may drink clear liquids up to 3 hours before appointment.  Clear liquids include water, black coffee, juice or soda.  No milk or cream please. ?You may take your regular medications, including pain medications, with a sip of water before your appointment.  Diabetics should hold regular insulin ( if take separately) and take 1/2 normal NPH dose the  morning of the procedure.  Carry some sugar containing items with you to your appointment. ?A driver must accompany you and be  prepared to drive you home after your procedure.  ?Bring all your current medications with you. ?An IV may be inserted and sedation may be given at the discretion of the physician.  ?A blood pressure cuff, EKG, and other monitors will often be applied during the procedure.  Some patients may need to have extra oxygen administered for a short period. ?You will be asked to provide medical information, including your allergies and medications, prior to the procedure.  We must know immediately if you are taking blood thinners (like Coumadin/Warfarin) or if you are allergic to IV iodine contrast (dye).  We must know if you could possibly be pregnant. ? ?Possible side-effects: ? ?Bleeding from needle site ?Infection (rare, may require surgery) ?Nerve injury (rare) ?Numbness & tingling (temporary) ?Punctured lung (if injection around chest) ?Light-headedness (temporary) ?Pain at injection site (several days) ?Decreased blood pressure (rare, temporary) ?Weakness in arm/leg (temporary) ? ?Call if you experience: ? ?Hive or difficulty breathing (go to the emergency room) ?Inflammation or drainage at the injection site(s) ? ?Please note: ? ?Although the local anesthetic injected can often make your painful muscle feel good for several hours after the injection, the pain may return.  It takes 3-7 days for steroids to work.  You may not notice any pain relief for at least one week. ? ?If effective, we will often do a series of injections spaced 3-6 weeks apart to maximally decrease your pain. ? ?If you have any questions please call [phone] ?[facility] ?

## 2021-05-11 NOTE — Progress Notes (Signed)
? ?BP 126/69   Pulse 78   Temp 98 ?F (36.7 ?C)   Wt 245 lb (111.1 kg)   SpO2 98%   BMI 35.15 kg/m?   ? ?Subjective:  ? ? Patient ID: Tony Cardenas, male    DOB: Sep 20, 1970, 51 y.o.   MRN: [id number] ? ?HPI: ?Tony Cardenas is a 51 y.o. male ? ?Chief Complaint  ?Patient presents with  ? Diabetes  ? Hypertension  ? Gastroesophageal Reflux  ? ?DIABETES ?Hypoglycemic episodes:no ?Polydipsia/polyuria: no ?Visual disturbance: no ?Chest pain: no ?Paresthesias: no ?Glucose Monitoring: no ? Accucheck frequency:  rarely ?Taking Insulin?: no ?Blood Pressure Monitoring: not checking ?Retinal Examination: Up to Date ?Foot Exam: Up to Date ?Diabetic Education: Completed ?Pneumovax: Up to Date ?Influenza: Up to Date ?Aspirin: yes ? ?HYPERTENSION / HYPERLIPIDEMIA ?Satisfied with current treatment? yes ?Duration of hypertension: chronic ?BP monitoring frequency: not checking ?BP medication side effects: no ?Past BP meds: lisinopril ?Duration of hyperlipidemia: chronic ?Cholesterol medication side effects: no ?Cholesterol supplements: none ?Past cholesterol medications: zetia ?Medication compliance: excellent compliance ?Aspirin: yes ?Recent stressors: no ?Recurrent headaches: no ?Visual changes: no ?Palpitations: no ?Dyspnea: no ?Chest pain: no ?Lower extremity edema: no ?Dizzy/lightheaded: no ? ? ?Relevant past medical, surgical, family and social history reviewed and updated as indicated. Interim medical history since our last visit reviewed. ?Allergies and medications reviewed and updated. ? ?Review of Systems  ?Constitutional: Negative.   ?Respiratory: Negative.    ?Cardiovascular: Negative.   ?Gastrointestinal: Negative.   ?Musculoskeletal: Negative.   ?Neurological: Negative.   ?Psychiatric/Behavioral: Negative.    ? ?Per HPI unless specifically indicated above ? ?   ?Objective:  ?  ?BP 126/69   Pulse 78   Temp 98 ?F (36.7 ?C)   Wt 245 lb (111.1 kg)   SpO2 98%   BMI 35.15 kg/m?   ?Wt Readings from Last 3  Encounters:  ?05/11/21 245 lb (111.1 kg)  ?05/11/21 245 lb (111.1 kg)  ?02/24/21 240 lb (108.9 kg)  ?  ?Physical Exam ?Vitals and nursing note reviewed.  ?Constitutional:   ?   General: He is not in acute distress. ?   Appearance: Normal appearance. He is obese. He is not ill-appearing, toxic-appearing or diaphoretic.  ?HENT:  ?   Head: Normocephalic and atraumatic.  ?   Right Ear: External ear normal.  ?   Left Ear: External ear normal.  ?   Nose: Nose normal.  ?   Mouth/Throat:  ?   Mouth: Mucous membranes are moist.  ?   Pharynx: Oropharynx is clear.  ?Eyes:  ?   General: No scleral icterus.    ?   Right eye: No discharge.     ?   Left eye: No discharge.  ?   Extraocular Movements: Extraocular movements intact.  ?   Conjunctiva/sclera: Conjunctivae normal.  ?   Pupils: Pupils are equal, round, and reactive to light.  ?Cardiovascular:  ?   Rate and Rhythm: Normal rate and regular rhythm.  ?   Pulses: Normal pulses.  ?   Heart sounds: Normal heart sounds. No murmur heard. ?  No friction rub. No gallop.  ?Pulmonary:  ?   Effort: Pulmonary effort is normal. No respiratory distress.  ?   Breath sounds: Normal breath sounds. No stridor. No wheezing, rhonchi or rales.  ?Chest:  ?   Chest wall: No tenderness.  ?Musculoskeletal:     ?   General: Normal range of motion.  ?   Cervical back: Normal range  of motion and neck supple.  ?Skin: ?   General: Skin is warm and dry.  ?   Capillary Refill: Capillary refill takes less than 2 seconds.  ?   Coloration: Skin is not jaundiced or pale.  ?   Findings: No bruising, erythema, lesion or rash.  ?Neurological:  ?   General: No focal deficit present.  ?   Mental Status: He is alert and oriented to person, place, and time. Mental status is at baseline.  ?Psychiatric:     ?   Mood and Affect: Mood normal.     ?   Behavior: Behavior normal.     ?   Thought Content: Thought content normal.     ?   Judgment: Judgment normal.  ? ? ?Results for orders placed or performed in visit on  05/11/21  ?Bayer DCA Hb A1c Waived  ?Result Value Ref Range  ? HB A1C (BAYER DCA - WAIVED) 7.7 (H) 4.8 - 5.6 %  ? ?   ?Assessment & Plan:  ? ?Problem List Items Addressed This Visit   ? ?  ? Cardiovascular and Mediastinum  ? HTN (hypertension)  ?  Under good control on current regimen. Continue current regimen. Continue to monitor. Call with any concerns. Refills given. Labs drawn today.  ? ? ?  ?  ? Relevant Medications  ? lisinopril (ZESTRIL) 5 MG tablet  ? ezetimibe (ZETIA) 10 MG tablet  ? Other Relevant Orders  ? Comprehensive metabolic panel  ?  ? Respiratory  ? COPD (chronic obstructive pulmonary disease) ([place])  ?  Under good control on current regimen. Continue current regimen. Continue to monitor. Call with any concerns. Refills given. Labs drawn today.  ? ? ?  ?  ? Relevant Medications  ? albuterol (VENTOLIN HFA) 108 (90 Base) MCG/ACT inhaler  ?  ? Digestive  ? GERD (gastroesophageal reflux disease)  ?  Under good control on current regimen. Continue current regimen. Continue to monitor. Call with any concerns. Refills given. Labs drawn today.  ? ? ?  ?  ? Relevant Orders  ? Comprehensive metabolic panel  ? CBC with Differential/Platelet  ?  ? Endocrine  ? Type 2 diabetes mellitus with hyperglycemia (HCC) - Primary  ?  Doing much better with a1c of 7.7 down from 9.3. Will start ozempic with goal of getting him below 7. Continue to monitor. Recheck 3 months. Call with any concerns. Refills given.  ? ?  ?  ? Relevant Medications  ? Semaglutide,0.25 or 0.'5MG'$ /DOS, (OZEMPIC, 0.25 OR 0.5 MG/DOSE,) 2 MG/3ML SOPN  ? metFORMIN (GLUCOPHAGE-XR) 500 MG 24 hr tablet  ? lisinopril (ZESTRIL) 5 MG tablet  ? empagliflozin (JARDIANCE) 25 MG TABS tablet  ? Other Relevant Orders  ? Comprehensive metabolic panel  ? Bayer DCA Hb A1c Waived (Completed)  ?  ? Other  ? Hyperlipidemia  ?  Under good control on current regimen. Continue current regimen. Continue to monitor. Call with any concerns. Refills given. Labs drawn today.   ? ? ?  ?  ? Relevant Medications  ? lisinopril (ZESTRIL) 5 MG tablet  ? ezetimibe (ZETIA) 10 MG tablet  ? Other Relevant Orders  ? Comprehensive metabolic panel  ? Lipid Panel w/o Chol/HDL Ratio  ? Abnormal TSH  ?  Rechecking labs today. Await results. Treat as needed.  ? ?  ?  ? Relevant Orders  ? Comprehensive metabolic panel  ? TSH  ? Vitamin D deficiency  ?  Rechecking labs today. Await  results. Treat as needed.  ? ?  ?  ? Relevant Orders  ? Comprehensive metabolic panel  ? VITAMIN D 25 Hydroxy (Vit-D Deficiency, Fractures)  ? ?Other Visit Diagnoses   ? ? Screening for colon cancer      ? Referral to GI placed today.  ? Relevant Orders  ? Ambulatory referral to Gastroenterology  ? ?  ?  ? ?Follow up plan: ?Return in about 3 months (around 08/11/2021) for physical. ? ? ? ? ? ?

## 2021-05-11 NOTE — Progress Notes (Signed)
Nursing Pain Medication Assessment:  ?Safety precautions to be maintained throughout the outpatient stay will include: orient to surroundings, keep bed in low position, maintain call bell within reach at all times, provide assistance with transfer out of bed and ambulation.  ?Medication Inspection Compliance: Pill count conducted under aseptic conditions, in front of the patient. Neither the pills nor the bottle was removed from the patient's sight at any time. Once count was completed pills were immediately returned to the patient in their original bottle. ? ?Medication: Hydrocodone/APAP ?Pill/Patch Count:  68 of 120 pills remain ?Pill/Patch Appearance: Markings consistent with prescribed medication ?Bottle Appearance: Standard pharmacy container. Clearly labeled. ?Filled Date: 04 / 19 / 2023 ?Last Medication intake:  Today ?

## 2021-05-11 NOTE — Progress Notes (Signed)
?PROVIDER NOTE: Information contained herein reflects review and annotations entered in association with encounter. Interpretation of such information and data should be left to medically-trained personnel. Information provided to patient can be located elsewhere in the medical record under "Patient Instructions". Document created using STT-dictation technology, any transcriptional errors that may result from process are unintentional.  ?  ?Patient: Tony Cardenas  Service Category: E/M  Provider: Gillis Santa, MD  ?DOB: November 25, 1970  DOS: 08/18/2020  Specialty: Interventional Pain Management  ?MRN: [id number]  Setting: Ambulatory outpatient  PCP: Valerie Roys, DO  ?Type: Established Patient    Referring Provider: Valerie Roys, DO  ?Location: Office  Delivery: Face-to-face    ? ?HPI  ?[name], a 51 y.o. 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 [name] primary complain today is Neck Pain and Shoulder Pain (bilateral) ? ?Last encounter: My last encounter with him was on 03/25/21 ? ?Pertinent problems: [name] has S/P cervical spinal fusion; Cervical radicular pain; Cervical fusion syndrome; Chronic pain syndrome; and Cervical spondylosis with myelopathy and radiculopathy on their pertinent problem list. ?Pain Assessment: Severity of Chronic pain is reported as a 8 /10. Location: Neck  /radiates into both shoulders. Onset: More than a month ago. Quality: Shooting, Dull, Nagging. Timing: Constant. Modifying factor(s): lying down, TPI. ?Vitals:  height is 5' 10"  (1.778 m) and weight is 245 lb (111.1 kg). His temperature is 98.2 ?F (36.8 ?C). His blood pressure is 122/73 and his pulse is 81. His respiration is 18 and oxygen saturation is 97%.  ? ?Reason for encounter: medication management.   ? ? ?Patient presents today for medication management.  No significant change in his medical history since his last visit.  He is requesting repeat cervical and  trapezius trigger point injections that he had done last February which were helpful.  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  He drives there daily.  He states that keeping himself busy helps to manage his pain. ? ?We will refill his hydrocodone as below.  No change in dose.  Compliant with therapy.  UDS up-to-date and appropriate.  We will schedule for cervical/trapezius trigger point injections. ? ?Pharmacotherapy Assessment  ?Analgesic: Hydrocodone 7.5 mg QID PRN  ? ?Monitoring: ?[place] PMP: PDMP reviewed during this encounter.       ?Pharmacotherapy: No side-effects or adverse reactions reported. ?Compliance: No problems identified. ?Effectiveness: Clinically acceptable.  UDS:  ?Summary  ?Date Value Ref Range Status  ?02/16/2021 Note  Final  ?  Comment:  ?  ==================================================================== ?[employer] Select 13 (MW) ?==================================================================== ?Test                             Result       Flag       Units ? ?Drug Present and Declared for Prescription Verification ?  Hydrocodone                    540          EXPECTED   ng/mg creat ?  Hydromorphone                  604          EXPECTED   ng/mg creat ?  Dihydrocodeine                 192  EXPECTED   ng/mg creat ?  Norhydrocodone                 322          EXPECTED   ng/mg creat ?   Sources of hydrocodone include scheduled prescription medications. ?   Hydromorphone, dihydrocodeine and norhydrocodone are expected ?   metabolites of hydrocodone. Hydromorphone and dihydrocodeine are ?   also available as scheduled prescription medications. ? ?==================================================================== ?Test                      Result    Flag   Units      Ref Range ?  Creatinine              77               mg/dL      >=20 ?==================================================================== ?Declared Medications: ? The flagging and  interpretation on this report are based on the ? following declared medications.  Unexpected results may arise from ? inaccuracies in the declared medications. ? ? **Note: The testing scope of this panel includes these medications: ? ? Hydrocodone (Norco) ? ? **Note: The testing scope of this panel does not include the ? following reported medications: ? ? Acetaminophen (Norco) ? Albuterol (Ventolin HFA) ? Empagliflozin (Jardiance) ? Ezetimibe (Zetia) ? Fluticasone (Flonase) ? Folic Acid ? Lisinopril (Zestril) ? Metformin ? Methocarbamol (Robaxin) ? Methotrexate ? Omega-3 Fatty Acids ? Pregabalin (Lyrica) ? Vitamin D2 (Drisdol) ?==================================================================== ?For clinical consultation, please call [phone]. ?==================================================================== ?  ?  ? ? ?Renew annual urine toxicology screen for medication compliance monitoring ? ?ROS  ?Constitutional: Denies any fever or chills ?Gastrointestinal: No reported hemesis, hematochezia, vomiting, or acute GI distress ?Musculoskeletal:  mild neck, trapezius, shoulder pain ?Neurological: No reported episodes of acute onset apraxia, aphasia, dysarthria, agnosia, amnesia, paralysis, loss of coordination, or loss of consciousness ? ?Medication Review  ?HYDROcodone-acetaminophen, Vitamin D (Ergocalciferol), albuterol, amoxicillin-clavulanate, blood glucose meter kit and supplies, empagliflozin, ezetimibe, fluticasone, folic acid, lisinopril, metFORMIN, methocarbamol, methotrexate, omega-3 acid ethyl esters, and pregabalin ? ?History Review  ?Allergy: [name] is allergic to atorvastatin and hydromorphone hcl. ?Drug: [name]  reports no history of drug use. ?Alcohol:  reports no history of alcohol use. ?Tobacco:  reports that he quit smoking about 2 years ago. His smoking use included cigarettes. He has a 10.00 pack-year smoking history. He quit smokeless tobacco use about 5 years ago.  His  smokeless tobacco use included chew. ?Social: [name]  reports that he quit smoking about 2 years ago. His smoking use included cigarettes. He has a 10.00 pack-year smoking history. He quit smokeless tobacco use about 5 years ago.  His smokeless tobacco use included chew. He reports that he does not drink alcohol and does not use drugs. ?Medical:  has a past medical history of Arthritis, COPD (chronic obstructive pulmonary disease) ([place]), Diabetes mellitus without complication ([place]), GERD (gastroesophageal reflux disease), Hyperlipidemia, Hypertension, and Neck pain (1989). ?Surgical: [name]  has a past surgical history that includes Neck surgery (1989); Knee surgery (Right); Colonoscopy (N/A, 06/02/2014); Polypectomy (06/02/2014); Appendectomy; Esophagogastroduodenoscopy (egd) with propofol (N/A, 07/29/2016); Back surgery; Anterior cervical corpectomy (N/A, 10/10/2018); Hernia repair (over 10 years ago); Anterior cervical corpectomy (N/A, 05/01/2019); and Posterior cervical fusion/foraminotomy (N/A, 05/01/2019). ?Family: family history includes Alcohol abuse in his father; COPD in his father; Cancer in his mother; Diabetes in his brother, father, and paternal grandmother. ? ?Laboratory Chemistry Profile  ? ?  Renal ?Lab Results  ?Component Value Date  ? BUN 8 01/07/2021  ? CREATININE 0.78 01/07/2021  ? BCR 10 01/07/2021  ? GFRAA 114 12/16/2019  ? GFRNONAA >60 10/09/2020  ? ?  Hepatic ?Lab Results  ?Component Value Date  ? AST 22 01/07/2021  ? ALT 23 01/07/2021  ? ALBUMIN 4.5 01/07/2021  ? ALKPHOS 69 01/07/2021  ? HCVAB NON REACTIVE 03/24/2020  ? ?  ?Electrolytes ?Lab Results  ?Component Value Date  ? NA 136 01/07/2021  ? K 4.1 01/07/2021  ? CL 97 01/07/2021  ? CALCIUM 9.2 01/07/2021  ? ?  Bone ?Lab Results  ?Component Value Date  ? VD25OH 24.7 (L) 01/07/2021  ? ?  ?Inflammation (CRP: Acute Phase) (ESR: Chronic Phase) ?Lab Results  ?Component Value Date  ? CRP 0.7 05/26/2020  ? ESRSEDRATE 9 10/09/2020  ? ?     ?Note: Above Lab results reviewed. ? ?Recent Imaging Review  ?DG Chest 2 View ?CLINICAL DATA:  History of recent COVID-19 infection ? ?EXAM: ?CHEST - 2 VIEW ? ?COMPARISON:  04/24/2017 ? ?FINDINGS: ?Cardiac sh

## 2021-05-12 ENCOUNTER — Encounter: Payer: Self-pay | Admitting: Family Medicine

## 2021-05-12 ENCOUNTER — Telehealth: Payer: Self-pay

## 2021-05-12 LAB — VITAMIN D 25 HYDROXY (VIT D DEFICIENCY, FRACTURES): Vit D, 25-Hydroxy: 25 ng/mL — ABNORMAL LOW (ref 30.0–100.0)

## 2021-05-12 LAB — LIPID PANEL W/O CHOL/HDL RATIO
Cholesterol, Total: 207 mg/dL — ABNORMAL HIGH (ref 100–199)
HDL: 31 mg/dL — ABNORMAL LOW (ref >39)
LDL Chol Calc (NIH): 65 mg/dL (ref 0–99)
Triglycerides: 731 mg/dL (ref 0–149)
VLDL Cholesterol Cal: 111 mg/dL — ABNORMAL HIGH (ref 5–40)

## 2021-05-12 LAB — COMPREHENSIVE METABOLIC PANEL
ALT: 25 IU/L (ref 0–44)
AST: 22 IU/L (ref 0–40)
Albumin/Globulin Ratio: 2.3 — ABNORMAL HIGH (ref 1.2–2.2)
Albumin: 4.6 g/dL (ref 3.8–4.9)
Alkaline Phosphatase: 64 IU/L (ref 44–121)
BUN/Creatinine Ratio: 16 (ref 9–20)
BUN: 10 mg/dL (ref 6–24)
Bilirubin Total: 0.3 mg/dL (ref 0.0–1.2)
CO2: 21 mmol/L (ref 20–29)
Calcium: 9.3 mg/dL (ref 8.7–10.2)
Chloride: 102 mmol/L (ref 96–106)
Creatinine, Ser: 0.64 mg/dL — ABNORMAL LOW (ref 0.76–1.27)
Globulin, Total: 2 g/dL (ref 1.5–4.5)
Glucose: 234 mg/dL — ABNORMAL HIGH (ref 70–99)
Potassium: 4.5 mmol/L (ref 3.5–5.2)
Sodium: 139 mmol/L (ref 134–144)
Total Protein: 6.6 g/dL (ref 6.0–8.5)
eGFR: 114 mL/min/{1.73_m2} (ref >59)

## 2021-05-12 LAB — CBC WITH DIFFERENTIAL/PLATELET
Basophils Absolute: 0.1 10*3/uL (ref 0.0–0.2)
Basos: 1 %
EOS (ABSOLUTE): 0.1 10*3/uL (ref 0.0–0.4)
Eos: 2 %
Hematocrit: 44.2 % (ref 37.5–51.0)
Hemoglobin: 15.3 g/dL (ref 13.0–17.7)
Immature Grans (Abs): 0.1 10*3/uL (ref 0.0–0.1)
Immature Granulocytes: 1 %
Lymphocytes Absolute: 3.2 10*3/uL — ABNORMAL HIGH (ref 0.7–3.1)
Lymphs: 40 %
MCH: 32.8 pg (ref 26.6–33.0)
MCHC: 34.6 g/dL (ref 31.5–35.7)
MCV: 95 fL (ref 79–97)
Monocytes Absolute: 0.4 10*3/uL (ref 0.1–0.9)
Monocytes: 5 %
Neutrophils Absolute: 4.3 10*3/uL (ref 1.4–7.0)
Neutrophils: 51 %
Platelets: 344 10*3/uL (ref 150–450)
RBC: 4.66 x10E6/uL (ref 4.14–5.80)
RDW: 14 % (ref 11.6–15.4)
WBC: 8.2 10*3/uL (ref 3.4–10.8)

## 2021-05-12 LAB — TSH: TSH: 0.448 u[IU]/mL — ABNORMAL LOW (ref 0.450–4.500)

## 2021-05-12 NOTE — Telephone Encounter (Signed)
CALLED PATIENT NO ANSWER LEFT VOICEMAIL FOR A CALL BACK °Letter sent °

## 2021-05-14 ENCOUNTER — Other Ambulatory Visit: Payer: Self-pay

## 2021-05-23 ENCOUNTER — Other Ambulatory Visit: Payer: Self-pay

## 2021-05-24 ENCOUNTER — Other Ambulatory Visit: Payer: Self-pay

## 2021-05-28 ENCOUNTER — Other Ambulatory Visit: Payer: Self-pay

## 2021-06-08 ENCOUNTER — Other Ambulatory Visit: Payer: Self-pay

## 2021-06-09 ENCOUNTER — Other Ambulatory Visit: Payer: Self-pay

## 2021-06-14 ENCOUNTER — Ambulatory Visit
Payer: 59 | Attending: Student in an Organized Health Care Education/Training Program | Admitting: Student in an Organized Health Care Education/Training Program

## 2021-06-14 ENCOUNTER — Encounter: Payer: Self-pay | Admitting: Student in an Organized Health Care Education/Training Program

## 2021-06-14 VITALS — BP 121/84 | HR 81 | Temp 97.3°F | Ht 71.0 in | Wt 245.0 lb

## 2021-06-14 DIAGNOSIS — G894 Chronic pain syndrome: Secondary | ICD-10-CM | POA: Diagnosis not present

## 2021-06-14 DIAGNOSIS — M542 Cervicalgia: Secondary | ICD-10-CM | POA: Diagnosis not present

## 2021-06-14 DIAGNOSIS — Q761 Klippel-Feil syndrome: Secondary | ICD-10-CM | POA: Diagnosis not present

## 2021-06-14 MED ORDER — ROPIVACAINE HCL 2 MG/ML IJ SOLN
INTRAMUSCULAR | Status: AC
  Filled 2021-06-14: qty 20

## 2021-06-14 MED ORDER — ROPIVACAINE HCL 2 MG/ML IJ SOLN
9.0000 mL | Freq: Once | INTRAMUSCULAR | Status: AC
  Administered 2021-06-14: 9 mL via PERINEURAL

## 2021-06-14 NOTE — Progress Notes (Signed)
Safety precautions to be maintained throughout the outpatient stay will include: orient to surroundings, keep bed in low position, maintain call bell within reach at all times, provide assistance with transfer out of bed and ambulation.  

## 2021-06-14 NOTE — Progress Notes (Signed)
PROVIDER NOTE: Interpretation of information contained herein should be left to medically-trained personnel. Specific patient instructions are provided elsewhere under "Patient Instructions" section of medical record. This document was created in part using STT-dictation technology, any transcriptional errors that may result from this process are unintentional.  Patient: Tony Cardenas Type: Established DOB: 16-Aug-1970 MRN: [id number] PCP: Valerie Roys, DO  Service: Procedure DOS: 06/14/2021 Setting: Ambulatory Location: Ambulatory outpatient facility Delivery: Face-to-face Provider: Gillis Santa, MD Specialty: Interventional Pain Management Specialty designation: 09 Location: Outpatient facility Ref. Prov.: Valerie Roys, DO    Primary Reason for Visit: Interventional Pain Management Treatment. CC: Neck Pain    Procedure:           Type: Trigger Point Injection (3+ muscle groups)          CPT: 20553 Primary Purpose: Diagnostic Cervical, Trapezius TPI Target Area: Trigger Point Approach: Percutaneous, ipsilateral approach. Laterality: Midline          1. Cervical fusion syndrome   2. Cervicalgia   3. Chronic pain syndrome    NAS-11 Pain score:   Pre-procedure: 9 /10   Post-procedure: 9 /10     Pre-op H&P Assessment:  Tony Cardenas is a 50 y.o. (year old), male patient, seen today for interventional treatment. He  has a past surgical history that includes Neck surgery (1989); Knee surgery (Right); Colonoscopy (N/A, 06/02/2014); Polypectomy (06/02/2014); Appendectomy; Esophagogastroduodenoscopy (egd) with propofol (N/A, 07/29/2016); Back surgery; Anterior cervical corpectomy (N/A, 10/10/2018); Hernia repair (over 10 years ago); Anterior cervical corpectomy (N/A, 05/01/2019); and Posterior cervical fusion/foraminotomy (N/A, 05/01/2019). Tony Cardenas has a current medication list which includes the following prescription(s): albuterol, blood glucose meter kit and supplies,  empagliflozin, ezetimibe, fluticasone, folic acid, hydrocodone-acetaminophen, [START ON 06/27/2021] hydrocodone-acetaminophen, [START ON 07/27/2021] hydrocodone-acetaminophen, lisinopril, metformin, methocarbamol, methotrexate, methotrexate, omega-3 acid ethyl esters, pregabalin, ozempic (0.25 or 0.5 mg/dose), prevident 5000 booster plus, and vitamin d (ergocalciferol). His primarily concern today is the Neck Pain  Initial Vital Signs:  Pulse/HCG Rate: 81  Temp: (!) 97.3 F (36.3 C) Resp:   BP: 121/84 SpO2: 98 %  BMI: Estimated body mass index is 34.17 kg/m as calculated from the following:   Height as of this encounter: 5' 11"  (1.803 m).   Weight as of this encounter: 245 lb (111.1 kg).  Risk Assessment: Allergies: Reviewed. He is allergic to atorvastatin and hydromorphone hcl.  Allergy Precautions: None required Coagulopathies: Reviewed. None identified.  Blood-thinner therapy: None at this time Active Infection(s): Reviewed. None identified. Tony Cardenas is afebrile  Site Confirmation: Tony Cardenas was asked to confirm the procedure and laterality before marking the site Procedure checklist: Completed Consent: Before the procedure and under the influence of no sedative(s), amnesic(s), or anxiolytics, the patient was informed of the treatment options, risks and possible complications. To fulfill our ethical and legal obligations, as recommended by the American Medical Association's Code of Ethics, I have informed the patient of my clinical impression; the nature and purpose of the treatment or procedure; the risks, benefits, and possible complications of the intervention; the alternatives, including doing nothing; the risk(s) and benefit(s) of the alternative treatment(s) or procedure(s); and the risk(s) and benefit(s) of doing nothing. The patient was provided information about the general risks and possible complications associated with the procedure. These may include, but are not limited  to: failure to achieve desired goals, infection, bleeding, organ or nerve damage, allergic reactions, paralysis, and death. In addition, the patient was informed of those risks and complications associated to the  procedure, such as failure to decrease pain; infection; bleeding; organ or nerve damage with subsequent damage to sensory, motor, and/or autonomic systems, resulting in permanent pain, numbness, and/or weakness of one or several areas of the body; allergic reactions; (i.e.: anaphylactic reaction); and/or death. Furthermore, the patient was informed of those risks and complications associated with the medications. These include, but are not limited to: allergic reactions (i.e.: anaphylactic or anaphylactoid reaction(s)); adrenal axis suppression; blood sugar elevation that in diabetics may result in ketoacidosis or comma; water retention that in patients with history of congestive heart failure may result in shortness of breath, pulmonary edema, and decompensation with resultant heart failure; weight gain; swelling or edema; medication-induced neural toxicity; particulate matter embolism and blood vessel occlusion with resultant organ, and/or nervous system infarction; and/or aseptic necrosis of one or more joints. Finally, the patient was informed that Medicine is not an exact science; therefore, there is also the possibility of unforeseen or unpredictable risks and/or possible complications that may result in a catastrophic outcome. The patient indicated having understood very clearly. We have given the patient no guarantees and we have made no promises. Enough time was given to the patient to ask questions, all of which were answered to the patient's satisfaction. Tony Cardenas has indicated that he wanted to continue with the procedure. Attestation: I, the ordering provider, attest that I have discussed with the patient the benefits, risks, side-effects, alternatives, likelihood of achieving goals,  and potential problems during recovery for the procedure that I have provided informed consent. Date  Time: 06/14/2021  8:12 AM  Pre-Procedure Preparation:  Monitoring: As per clinic protocol. Respiration, ETCO2, SpO2, BP, heart rate and rhythm monitor placed and checked for adequate function Safety Precautions: Patient was assessed for positional comfort and pressure points before starting the procedure. Time-out: I initiated and conducted the "Time-out" before starting the procedure, as per protocol. The patient was asked to participate by confirming the accuracy of the "Time Out" information. Verification of the correct person, site, and procedure were performed and confirmed by me, the nursing staff, and the patient. "Time-out" conducted as per Joint Commission's Universal Protocol (UP.01.01.01). Time: 0831  Description of Procedure:          Area Prepped: Entire             Region DuraPrep (Iodine Povacrylex [0.7% available iodine] and Isopropyl Alcohol, 74% w/w) Safety Precautions: Aspiration looking for blood return was conducted prior to all injections. At no point did we inject any substances, as a needle was being advanced. No attempts were made at seeking any paresthesias. Safe injection practices and needle disposal techniques used. Medications properly checked for expiration dates. SDV (single dose vial) medications used. Description of the Procedure: Protocol guidelines were followed. The patient was placed in position over the fluoroscopy table. The target area was identified and the area prepped in the usual manner. Skin & deeper tissues infiltrated with local anesthetic. Appropriate amount of time allowed to pass for local anesthetics to take effect. The procedure needles were then advanced to the target area. Proper needle placement secured. Negative aspiration confirmed. Solution injected in intermittent fashion, asking for systemic symptoms every 0.5cc of injectate. The needles were  then removed and the area cleansed, making sure to leave some of the prepping solution back to take advantage of its long term bactericidal properties.  Vitals:   06/14/21 0819  BP: 121/84  Pulse: 81  Temp: (!) 97.3 F (36.3 C)  SpO2: 98%  Weight: 245  lb (111.1 kg)  Height: 5' 11"  (1.803 m)     Start Time: 0831 hrs. End Time: 0836 hrs. Materials:     Approximately 10 trigger points injected on each side with dry needling performed.  Each trigger point was injected with 0.5 to 1 cc of 0.2% Ropivacaine with dry needling performed.  Post-operative Assessment:  Post-procedure Vital Signs:  Pulse/HCG Rate: 81  Temp: (!) 97.3 F (36.3 C) Resp:   BP: 121/84 SpO2: 98 %  EBL: None  Complications: No immediate post-treatment complications observed by team, or reported by patient.  Note: The patient tolerated the entire procedure well. A repeat set of vitals were taken after the procedure and the patient was kept under observation following institutional policy, for this type of procedure. Post-procedural neurological assessment was performed, showing return to baseline, prior to discharge. The patient was provided with post-procedure discharge instructions, including a section on how to identify potential problems. Should any problems arise concerning this procedure, the patient was given instructions to immediately contact us, at any time, without hesitation. In any case, we plan to contact the patient by telephone for a follow-up status report regarding this interventional procedure.  Comments:  No additional relevant information.  Plan of Care    Chronic Opioid Analgesic:  Hydrocodone 7.5 mg QID PRN   Medications ordered for procedure: Meds ordered this encounter  Medications   ropivacaine (PF) 2 mg/mL (0.2%) (NAROPIN) injection 9 mL   Medications administered: We administered ropivacaine (PF) 2 mg/mL (0.2%).  See the medical record for exact dosing, route, and time of  administration.  Follow-up plan:   Return for Keep sch. appt.      Recent Visits Date Type Provider Dept  05/11/21 Office Visit Gillis Santa, MD [facility]  03/25/21 Office Visit Gillis Santa, MD [facility]  Showing recent visits within past 90 days and meeting all other requirements Today's Visits Date Type Provider Dept  06/14/21 Procedure visit Gillis Santa, MD [facility]  Showing today's visits and meeting all other requirements Future Appointments Date Type Provider Dept  08/10/21 Appointment Gillis Santa, MD [facility]  Showing future appointments within next 90 days and meeting all other requirements  Disposition: Discharge home  Discharge (Date  Time): 06/14/2021; 0837 hrs.   Primary Care Physician: Valerie Roys, DO Location: [facility] Note by: Gillis Santa, MD Date: 06/14/2021; Time: 8:41 AM  Disclaimer:  Medicine is not an exact science. The only guarantee in medicine is that nothing is guaranteed. It is important to note that the decision to proceed with this intervention was based on the information collected from the patient. The Data and conclusions were drawn from the patient's questionnaire, the interview, and the physical examination. Because the information was provided in large part by the patient, it cannot be guaranteed that it has not been purposely or unconsciously manipulated. Every effort has been made to obtain as much relevant data as possible for this evaluation. It is important to note that the conclusions that lead to this procedure are derived in large part from the available data. Always take into account that the treatment will also be dependent on availability of resources and existing treatment guidelines, considered by other Pain Management Practitioners as being common knowledge and practice, at the time of the intervention. For Medico-Legal purposes, it is also important  to point out that variation in procedural techniques and pharmacological choices are the acceptable norm. The indications, contraindications, technique,  and results of the above procedure should only be interpreted and judged by a Board-Certified Interventional Pain Specialist with extensive familiarity and expertise in the same exact procedure and technique.

## 2021-06-14 NOTE — Patient Instructions (Signed)

## 2021-06-15 ENCOUNTER — Telehealth: Payer: Self-pay | Admitting: *Deleted

## 2021-06-15 NOTE — Telephone Encounter (Signed)
No problems post procedure. 

## 2021-06-16 ENCOUNTER — Ambulatory Visit: Payer: 59 | Admitting: Student in an Organized Health Care Education/Training Program

## 2021-06-20 ENCOUNTER — Other Ambulatory Visit: Payer: Self-pay

## 2021-06-21 ENCOUNTER — Other Ambulatory Visit: Payer: Self-pay

## 2021-06-21 MED ORDER — METHOTREXATE 2.5 MG PO TABS
ORAL_TABLET | ORAL | 3 refills | Status: DC
  Filled 2021-06-21: qty 20, 28d supply, fill #0
  Filled 2021-06-28: qty 20, 4d supply, fill #0
  Filled 2021-07-18: qty 20, 28d supply, fill #1
  Filled 2021-08-15: qty 20, 28d supply, fill #2
  Filled 2021-09-12: qty 20, 28d supply, fill #3

## 2021-06-22 ENCOUNTER — Other Ambulatory Visit: Payer: Self-pay

## 2021-06-23 ENCOUNTER — Other Ambulatory Visit: Payer: Self-pay

## 2021-06-28 ENCOUNTER — Other Ambulatory Visit: Payer: Self-pay

## 2021-06-29 ENCOUNTER — Other Ambulatory Visit: Payer: Self-pay

## 2021-07-02 ENCOUNTER — Other Ambulatory Visit: Payer: Self-pay

## 2021-07-14 ENCOUNTER — Other Ambulatory Visit: Payer: Self-pay

## 2021-07-18 ENCOUNTER — Other Ambulatory Visit: Payer: Self-pay

## 2021-07-19 ENCOUNTER — Other Ambulatory Visit: Payer: Self-pay

## 2021-07-22 ENCOUNTER — Other Ambulatory Visit: Payer: Self-pay

## 2021-07-27 ENCOUNTER — Other Ambulatory Visit: Payer: Self-pay

## 2021-08-10 ENCOUNTER — Other Ambulatory Visit: Payer: Self-pay

## 2021-08-10 ENCOUNTER — Encounter: Payer: Self-pay | Admitting: Student in an Organized Health Care Education/Training Program

## 2021-08-10 ENCOUNTER — Ambulatory Visit
Payer: 59 | Attending: Student in an Organized Health Care Education/Training Program | Admitting: Student in an Organized Health Care Education/Training Program

## 2021-08-10 DIAGNOSIS — G894 Chronic pain syndrome: Secondary | ICD-10-CM | POA: Diagnosis not present

## 2021-08-10 DIAGNOSIS — Q761 Klippel-Feil syndrome: Secondary | ICD-10-CM | POA: Insufficient documentation

## 2021-08-10 DIAGNOSIS — M5412 Radiculopathy, cervical region: Secondary | ICD-10-CM | POA: Diagnosis not present

## 2021-08-10 MED ORDER — HYDROCODONE-ACETAMINOPHEN 7.5-325 MG PO TABS
1.0000 | ORAL_TABLET | Freq: Four times a day (QID) | ORAL | 0 refills | Status: AC | PRN
  Filled 2021-08-27 (×2): qty 120, 30d supply, fill #0

## 2021-08-10 MED ORDER — HYDROCODONE-ACETAMINOPHEN 7.5-325 MG PO TABS
1.0000 | ORAL_TABLET | Freq: Four times a day (QID) | ORAL | 0 refills | Status: DC | PRN
  Filled 2021-10-25: qty 20, 5d supply, fill #0
  Filled 2021-10-25: qty 100, 25d supply, fill #0

## 2021-08-10 MED ORDER — HYDROCODONE-ACETAMINOPHEN 7.5-325 MG PO TABS
1.0000 | ORAL_TABLET | Freq: Four times a day (QID) | ORAL | 0 refills | Status: AC | PRN
  Filled 2021-09-24: qty 120, 30d supply, fill #0

## 2021-08-10 NOTE — Progress Notes (Signed)
PROVIDER NOTE: Information contained herein reflects review and annotations entered in association with encounter. Interpretation of such information and data should be left to medically-trained personnel. Information provided to patient can be located elsewhere in the medical record under "Patient Instructions". Document created using STT-dictation technology, any transcriptional errors that may result from process are unintentional.    Patient: Tony Cardenas  Service Category: E/M  Provider: Gillis Santa, MD  DOB: Nov 03, 1970  DOS: 08/18/2020  Specialty: Interventional Pain Management  MRN: [id number]  Setting: Ambulatory outpatient  PCP: Valerie Roys, DO  Type: Established Patient    Referring Provider: Valerie Roys, DO  Location: Office  Delivery: Face-to-face     HPI  Tony Cardenas, a 51 y.o. year old male, is here today because of his No primary diagnosis found.. Tony Cardenas primary complain today is Other (Rheumatoid arthitis ) and Neck Pain (Bilateral )  Last encounter: My last encounter with him was on 06/14/21  Pertinent problems: Tony Cardenas has S/P cervical spinal fusion; Cervical radicular pain; Cervical fusion syndrome; Chronic pain syndrome; and Cervical spondylosis with myelopathy and radiculopathy on their pertinent problem list. Pain Assessment: Severity of Chronic pain is reported as a 7 /10. Location: Neck Left, Right/neck pain into shoulder blades and down the arms into the hands.. Onset: More than a month ago. Quality: Numbness, Discomfort, Constant, Tingling. Timing: Constant. Modifying factor(s): medications, heat/cold.. Vitals:  height is _0  (1.803 m) and weight is 245 lb (111.1 kg). His temporal temperature is 98.4 F (36.9 C). His blood pressure is 139/70 and his pulse is 95. His respiration is 16 and oxygen saturation is 98%.   Reason for encounter: both, medication management and post-procedure assessment.    No change in medical history  since last visit.  Patient's pain is at baseline.  Patient continues multimodal pain regimen as prescribed.  States that it provides pain relief and improvement in functional status.  Post-procedure evaluation    Procedure:           Type: Trigger Point Injection (3+ muscle groups)          CPT: 20553 Primary Purpose: Diagnostic Cervical, Trapezius TPI Target Area: Trigger Point Approach: Percutaneous, ipsilateral approach. Laterality: Midline          1. Cervical fusion syndrome   2. Cervicalgia   3. Chronic pain syndrome    NAS-11 Pain score:   Pre-procedure: 9 /10   Post-procedure: 9 /10      Effectiveness:  Initial hour after procedure: 60 %  Subsequent 4-6 hours post-procedure: 60 %  Analgesia past initial 6 hours: 60 %  Ongoing improvement:  Analgesic:  60% Function: Somewhat improved ROM: Somewhat improved   Pharmacotherapy Assessment  Analgesic: Hydrocodone 7.5 mg QID PRN   Monitoring: [place] PMP: PDMP reviewed during this encounter.       Pharmacotherapy: No side-effects or adverse reactions reported. Compliance: No problems identified. Effectiveness: Clinically acceptable.  UDS:  Summary  Date Value Ref Range Status  02/16/2021 Note  Final    Comment:    ==================================================================== [employer] Select 13 (MW) ==================================================================== Test                             Result       Flag       Units  Drug Present and Declared for Prescription Verification   Hydrocodone  540          EXPECTED   ng/mg creat   Hydromorphone                  604          EXPECTED   ng/mg creat   Dihydrocodeine                 192          EXPECTED   ng/mg creat   Norhydrocodone                 322          EXPECTED   ng/mg creat    Sources of hydrocodone include scheduled prescription medications.    Hydromorphone, dihydrocodeine and norhydrocodone are expected    metabolites of  hydrocodone. Hydromorphone and dihydrocodeine are    also available as scheduled prescription medications.  ==================================================================== Test                      Result    Flag   Units      Ref Range   Creatinine              77               mg/dL      >=20 ==================================================================== Declared Medications:  The flagging and interpretation on this report are based on the  following declared medications.  Unexpected results may arise from  inaccuracies in the declared medications.   **Note: The testing scope of this panel includes these medications:   Hydrocodone (Norco)   **Note: The testing scope of this panel does not include the  following reported medications:   Acetaminophen (Norco)  Albuterol (Ventolin HFA)  Empagliflozin (Jardiance)  Ezetimibe (Zetia)  Fluticasone (Flonase)  Folic Acid  Lisinopril (Zestril)  Metformin  Methocarbamol (Robaxin)  Methotrexate  Omega-3 Fatty Acids  Pregabalin (Lyrica)  Vitamin D2 (Drisdol) ==================================================================== For clinical consultation, please call [phone]. ====================================================================        ROS  Constitutional: Denies any fever or chills Gastrointestinal: No reported hemesis, hematochezia, vomiting, or acute GI distress Musculoskeletal:  mild neck, trapezius, shoulder pain Neurological: No reported episodes of acute onset apraxia, aphasia, dysarthria, agnosia, amnesia, paralysis, loss of coordination, or loss of consciousness  Medication Review  HYDROcodone-acetaminophen, Semaglutide(0.25 or 0.5MG/DOS), Sodium Fluoride, Vitamin D (Ergocalciferol), albuterol, blood glucose meter kit and supplies, empagliflozin, ezetimibe, fluticasone, folic acid, lisinopril, metFORMIN, methocarbamol, methotrexate, omega-3 acid ethyl esters, and pregabalin  History Review   Allergy: Tony Cardenas is allergic to atorvastatin and hydromorphone hcl. Drug: Tony Cardenas  reports no history of drug use. Alcohol:  reports no history of alcohol use. Tobacco:  reports that he quit smoking about 2 years ago. His smoking use included cigarettes. He has a 10.00 pack-year smoking history. He quit smokeless tobacco use about 5 years ago.  His smokeless tobacco use included chew. Social: Tony Cardenas  reports that he quit smoking about 2 years ago. His smoking use included cigarettes. He has a 10.00 pack-year smoking history. He quit smokeless tobacco use about 5 years ago.  His smokeless tobacco use included chew. He reports that he does not drink alcohol and does not use drugs. Medical:  has a past medical history of Arthritis, COPD (chronic obstructive pulmonary disease) ([place]), Diabetes mellitus without complication ([place]), GERD (gastroesophageal reflux disease), Hyperlipidemia, Hypertension, and Neck pain (1989). Surgical: Tony Cardenas  has a past surgical history  that includes Neck surgery (1989); Knee surgery (Right); Colonoscopy (N/A, 06/02/2014); Polypectomy (06/02/2014); Appendectomy; Esophagogastroduodenoscopy (egd) with propofol (N/A, 07/29/2016); Back surgery; Anterior cervical corpectomy (N/A, 10/10/2018); Hernia repair (over 10 years ago); Anterior cervical corpectomy (N/A, 05/01/2019); and Posterior cervical fusion/foraminotomy (N/A, 05/01/2019). Family: family history includes Alcohol abuse in his father; COPD in his father; Cancer in his mother; Diabetes in his brother, father, and paternal grandmother.  Laboratory Chemistry Profile   Renal Lab Results  Component Value Date   BUN 10 05/11/2021   CREATININE 0.73 05/11/2021   BCR 16 05/11/2021   GFRAA 114 12/16/2019   GFRNONAA >60 05/11/2021     Hepatic Lab Results  Component Value Date   AST 29 05/11/2021   ALT 23 05/11/2021   ALBUMIN 4.6 05/11/2021   ALKPHOS 64 05/11/2021   HCVAB NON REACTIVE 03/24/2020      Electrolytes Lab Results  Component Value Date   NA 139 05/11/2021   K 4.5 05/11/2021   CL 102 05/11/2021   CALCIUM 9.3 05/11/2021     Bone Lab Results  Component Value Date   VD25OH 25.0 (L) 05/11/2021     Inflammation (CRP: Acute Phase) (ESR: Chronic Phase) Lab Results  Component Value Date   CRP 0.7 05/26/2020   ESRSEDRATE 9 05/11/2021       Note: Above Lab results reviewed.  Recent Imaging Review  DG Chest 2 View CLINICAL DATA:  History of recent COVID-19 infection  EXAM: CHEST - 2 VIEW  COMPARISON:  04/24/2017  FINDINGS: Cardiac shadow is within normal limits. Patchy airspace opacities are noted in the bases bilaterally consistent with the given clinical history. No sizable effusion is seen. No acute bony abnormality is noted. Postsurgical changes of the cervical spine are noted.  IMPRESSION: Patchy airspace opacities in the bases bilaterally consistent with the given clinical history.  Electronically Signed   By: Inez Catalina M.D.   On: 01/13/2020 16:43  Note: Reviewed        Physical Exam  General appearance: Well nourished, well developed, and well hydrated. In no apparent acute distress Mental status: Alert, oriented x 3 (person, place, & time)       Respiratory: No evidence of acute respiratory distress Eyes: PERLA Vitals: BP 139/70 (BP Location: Left Arm, Patient Position: Sitting, Cuff Size: Large)   Pulse 95   Temp 98.4 F (36.9 C) (Temporal)   Resp 16   Ht _0  (1.803 m)   Wt 245 lb (111.1 kg)   SpO2 98%   BMI 34.17 kg/m  BMI: Estimated body mass index is 34.17 kg/m as calculated from the following:   Height as of this encounter: _1  (1.803 m).   Weight as of this encounter: 245 lb (111.1 kg). Ideal: Ideal body weight: 75.3 kg (166 lb 0.1 oz) Adjusted ideal body weight: 89.6 kg (197 lb 9.7 oz)   Neuropathic pain pattern of upper extremity, persistent Neuropathic pain of bilateral lower extremity   Assessment   Status  Diagnosis  Controlled Controlled Controlled 1. Cervical fusion syndrome   2. Cervical radicular pain   3. Chronic pain syndrome         Plan of Care   Tony Cardenas has a current medication list which includes the following long-term medication(s): albuterol, ezetimibe, fluticasone, lisinopril, metformin, omega-3 acid ethyl esters, and pregabalin.  Pharmacotherapy (Medications Ordered): Meds ordered this encounter  Medications   HYDROcodone-acetaminophen (NORCO) 7.5-325 MG tablet    Sig: Take 1 tablet by mouth every 6 (  six) hours as needed for moderate pain.    Dispense:  120 tablet    Refill:  0   HYDROcodone-acetaminophen (NORCO) 7.5-325 MG tablet    Sig: Take 1 tablet by mouth every 6 (six) hours as needed for moderate pain.    Dispense:  120 tablet    Refill:  0   HYDROcodone-acetaminophen (NORCO) 7.5-325 MG tablet    Sig: Take 1 tablet by mouth every 6 (six) hours as needed for moderate pain.    Dispense:  120 tablet    Refill:  0   Continue Lyrica and Robaxin as prescribed.  No refills needed. UDS up-to-date and appropriate.  No orders of the defined types were placed in this encounter.   Discussed Qutenza, capsaicin 8% treatment for painful diabetic neuropathy of lower extremity.  Follow-up plan:   Return in about 3 months (around 11/10/2021) for Medication Management, in person.   Recent Visits Date Type Provider Dept  06/14/21 Procedure visit Gillis Santa, MD [facility]  Showing recent visits within past 90 days and meeting all other requirements Today's Visits Date Type Provider Dept  08/10/21 Office Visit Gillis Santa, MD [facility]  Showing today's visits and meeting all other requirements Future Appointments Date Type Provider Dept  11/04/21 Appointment Gillis Santa, MD [facility]  Showing future appointments within next 90 days and meeting all other requirements  I discussed the assessment and treatment  plan with the patient. The patient was provided an opportunity to ask questions and all were answered. The patient agreed with the plan and demonstrated an understanding of the instructions.  Patient advised to call back or seek an in-person evaluation if the symptoms or condition worsens.  Duration of encounter: 42mnutes.  Note by: BGillis Santa MD Date: 08/10/2021; Time: 9:50 AM

## 2021-08-10 NOTE — Progress Notes (Signed)
Nursing Pain Medication Assessment:  Safety precautions to be maintained throughout the outpatient stay will include: orient to surroundings, keep bed in low position, maintain call bell within reach at all times, provide assistance with transfer out of bed and ambulation.  Medication Inspection Compliance: Pill count conducted under aseptic conditions, in front of the patient. Neither the pills nor the bottle was removed from the patient's sight at any time. Once count was completed pills were immediately returned to the patient in their original bottle.  Medication: Hydrocodone/APAP Pill/Patch Count:  67 of 120 pills remain Pill/Patch Appearance: Markings consistent with prescribed medication Bottle Appearance: Standard pharmacy container. Clearly labeled. Filled Date: 07 / 18 / 2023 Last Medication intake:  Today

## 2021-08-15 ENCOUNTER — Other Ambulatory Visit: Payer: Self-pay | Admitting: Family Medicine

## 2021-08-16 ENCOUNTER — Other Ambulatory Visit: Payer: Self-pay

## 2021-08-16 MED ORDER — OMEGA-3-ACID ETHYL ESTERS 1 G PO CAPS
1.0000 g | ORAL_CAPSULE | Freq: Every day | ORAL | 2 refills | Status: DC
  Filled 2021-08-16: qty 90, 90d supply, fill #0
  Filled 2021-11-14: qty 90, 90d supply, fill #1
  Filled 2022-02-21 – 2022-02-23 (×2): qty 90, 90d supply, fill #2

## 2021-08-16 NOTE — Telephone Encounter (Signed)
Requested Prescriptions  Pending Prescriptions Disp Refills  . omega-3 acid ethyl esters (LOVAZA) 1 g capsule 90 capsule 1    Sig: Take 1 capsule (1 g total) by mouth daily.     Endocrinology:  Nutritional Agents - omega-3 acid ethyl esters Failed - 08/15/2021  9:48 AM      Failed - Lipid Panel in normal range within the last 12 months    Cholesterol, Total  Date Value Ref Range Status  05/11/2021 207 (H) 100 - 199 mg/dL Final   Cholesterol [place], [place]  Date Value Ref Range Status  06/22/2015 WILL FOLLOW  Preliminary   LDL Chol Calc (NIH)  Date Value Ref Range Status  05/11/2021 65 0 - 99 mg/dL Final   HDL  Date Value Ref Range Status  05/11/2021 31 (L) >39 mg/dL Final   Triglycerides  Date Value Ref Range Status  05/11/2021 731 (HH) 0 - 149 mg/dL Final   Triglycerides Pic[place],Waived  Date Value Ref Range Status  06/22/2015 WILL FOLLOW  Preliminary         Passed - Valid encounter within last 12 months    Recent Outpatient Visits          3 months ago Type 2 diabetes mellitus with hyperglycemia, without long-term current use of insulin ([place])   [facility], Megan P, DO   7 months ago Type 2 diabetes mellitus with hyperglycemia, without long-term current use of insulin ([place])   [place], Megan P, DO   1 year ago Routine general medical examination at a health care facility   [facility], [place] P, DO   1 year ago Type 2 diabetes mellitus with hyperglycemia, without long-term current use of insulin [facility])   [place], [place], DO   1 year ago COVID-19   [place], [place], DO      Future Appointments            In 2 weeks Wynetta Emery, Barb Merino, DO [employer]

## 2021-08-17 ENCOUNTER — Other Ambulatory Visit: Payer: Self-pay

## 2021-08-20 ENCOUNTER — Encounter: Payer: 59 | Admitting: Family Medicine

## 2021-08-27 ENCOUNTER — Other Ambulatory Visit: Payer: Self-pay

## 2021-09-02 ENCOUNTER — Ambulatory Visit (INDEPENDENT_AMBULATORY_CARE_PROVIDER_SITE_OTHER): Payer: 59 | Admitting: Family Medicine

## 2021-09-02 ENCOUNTER — Encounter: Payer: Self-pay | Admitting: Family Medicine

## 2021-09-02 ENCOUNTER — Other Ambulatory Visit: Payer: Self-pay

## 2021-09-02 ENCOUNTER — Telehealth: Payer: Self-pay

## 2021-09-02 VITALS — BP 112/71 | HR 84 | Temp 98.2°F | Ht 71.0 in | Wt 241.6 lb

## 2021-09-02 DIAGNOSIS — Z86010 Personal history of colonic polyps: Secondary | ICD-10-CM

## 2021-09-02 DIAGNOSIS — I10 Essential (primary) hypertension: Secondary | ICD-10-CM

## 2021-09-02 DIAGNOSIS — Z1211 Encounter for screening for malignant neoplasm of colon: Secondary | ICD-10-CM | POA: Diagnosis not present

## 2021-09-02 DIAGNOSIS — Z0000 Encounter for general adult medical examination without abnormal findings: Secondary | ICD-10-CM

## 2021-09-02 DIAGNOSIS — R7989 Other specified abnormal findings of blood chemistry: Secondary | ICD-10-CM

## 2021-09-02 DIAGNOSIS — Z23 Encounter for immunization: Secondary | ICD-10-CM

## 2021-09-02 DIAGNOSIS — E782 Mixed hyperlipidemia: Secondary | ICD-10-CM

## 2021-09-02 DIAGNOSIS — E559 Vitamin D deficiency, unspecified: Secondary | ICD-10-CM

## 2021-09-02 DIAGNOSIS — E1165 Type 2 diabetes mellitus with hyperglycemia: Secondary | ICD-10-CM

## 2021-09-02 LAB — URINALYSIS, ROUTINE W REFLEX MICROSCOPIC
Bilirubin, UA: NEGATIVE
Ketones, UA: NEGATIVE
Leukocytes,UA: NEGATIVE
Nitrite, UA: NEGATIVE
Protein,UA: NEGATIVE
RBC, UA: NEGATIVE
Specific Gravity, UA: 1.01 (ref 1.005–1.030)
Urobilinogen, Ur: 0.2 mg/dL (ref 0.2–1.0)
pH, UA: 5.5 (ref 5.0–7.5)

## 2021-09-02 LAB — BAYER DCA HB A1C WAIVED: HB A1C (BAYER DCA - WAIVED): 8.9 % — ABNORMAL HIGH (ref 4.8–5.6)

## 2021-09-02 LAB — MICROALBUMIN, URINE WAIVED
Creatinine, Urine Waived: 50 mg/dL (ref 10–300)
Microalb, Ur Waived: 10 mg/L (ref 0–19)
Microalb/Creat Ratio: <30 mg/g (ref <30)

## 2021-09-02 MED ORDER — NA SULFATE-K SULFATE-MG SULF 17.5-3.13-1.6 GM/177ML PO SOLN
1.0000 | Freq: Once | ORAL | 0 refills | Status: AC
  Filled 2021-09-02: qty 354, 1d supply, fill #0

## 2021-09-02 MED ORDER — SEMAGLUTIDE (1 MG/DOSE) 4 MG/3ML ~~LOC~~ SOPN
1.0000 mg | PEN_INJECTOR | SUBCUTANEOUS | 1 refills | Status: DC
  Filled 2021-09-02: qty 3, 28d supply, fill #0

## 2021-09-02 MED ORDER — EMPAGLIFLOZIN 25 MG PO TABS
ORAL_TABLET | Freq: Every day | ORAL | 1 refills | Status: DC
  Filled 2021-09-02: qty 90, fill #0
  Filled 2021-11-07: qty 90, 90d supply, fill #0

## 2021-09-02 NOTE — Assessment & Plan Note (Signed)
Rechecking labs today. Await results. Treat as needed.  °

## 2021-09-02 NOTE — Assessment & Plan Note (Signed)
Under good control on current regimen. Continue current regimen. Continue to monitor. Call with any concerns. Refills given. Labs drawn today.   
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
## 2021-09-02 NOTE — Telephone Encounter (Signed)
Gastroenterology Pre-Procedure Review  Request Date: 10/11/21 Requesting Physician: Dr. Allen Norris  PATIENT REVIEW QUESTIONS: The patient responded to the following health history questions as indicated:    1. Are you having any GI issues? no 2. Do you have a personal history of Polyps? yes (06/02/14 performed by Dr. Allen Norris) 3. Do you have a family history of Colon Cancer or Polyps? yes (mother colon cancer) 4. Diabetes Mellitus? yes (patient has been advised to hold Trulicity 7 days prior to colonoscopy, hold metformin 2 days prior to colonoscopy) 5. Joint replacements in the past 12 months?no 6. Major health problems in the past 3 months?no 7. Any artificial heart valves, MVP, or defibrillator?no    MEDICATIONS & ALLERGIES:    Patient reports the following regarding taking any anticoagulation/antiplatelet therapy:   Plavix, Coumadin, Eliquis, Xarelto, Lovenox, Pradaxa, Brilinta, or Effient? no Aspirin? no  Patient confirms/reports the following medications:  Current Outpatient Medications  Medication Sig Dispense Refill   albuterol (VENTOLIN HFA) 108 (90 Base) MCG/ACT inhaler INHALE 2 PUFFS INTO THE LUNGS EVERY 6 HOURS AS NEEDED FOR WHEEZING OR SHORTNESS OF BREATH. 6.7 g 3   blood glucose meter kit and supplies KIT Dispense based on patient and insurance preference. Use up to four times daily as directed. (FOR ICD-9 250.00, 250.01). 1 each 0   empagliflozin (JARDIANCE) 25 MG TABS tablet TAKE 1 TABLET BY MOUTH DAILY BEFORE BREAKFAST 90 tablet 1   ezetimibe (ZETIA) 10 MG tablet TAKE 1 TABLET BY MOUTH DAILY. 90 tablet 1   fluticasone (FLONASE) 50 MCG/ACT nasal spray PLACE 2 SPRAYS INTO BOTH NOSTRILS DAILY IN THE EVENING 16 g 12   folic acid (FOLVITE) 1 MG tablet Take 1 tablet (1 mg total) by mouth once daily 90 tablet 3   HYDROcodone-acetaminophen (NORCO) 7.5-325 MG tablet Take 1 tablet by mouth every 6 (six) hours as needed for moderate pain. 120 tablet 0   [START ON 09/25/2021]  HYDROcodone-acetaminophen (NORCO) 7.5-325 MG tablet Take 1 tablet by mouth every 6 (six) hours as needed for moderate pain. 120 tablet 0   [START ON 10/25/2021] HYDROcodone-acetaminophen (NORCO) 7.5-325 MG tablet Take 1 tablet by mouth every 6 (six) hours as needed for moderate pain. 120 tablet 0   lisinopril (ZESTRIL) 5 MG tablet TAKE 1 TABLET (5 MG TOTAL) BY MOUTH DAILY. 90 tablet 1   metFORMIN (GLUCOPHAGE-XR) 500 MG 24 hr tablet TAKE 2 TABLETS (1,000 MG TOTAL) BY MOUTH 2 (TWO) TIMES DAILY. 360 tablet 1   methocarbamol (ROBAXIN) 500 MG tablet Take 1 tablet (500 mg total) by mouth every 8 (eight) hours as needed for muscle spasms. 90 tablet 2   methotrexate (RHEUMATREX) 2.5 MG tablet Take 5 tablets (12.5 mg total) by mouth every 7 (seven) days All on the same day 20 tablet 3   methotrexate (RHEUMATREX) 2.5 MG tablet Take 5 tablets (12.5 mg total) by mouth every 7 (seven) days All on the same day (Patient not taking: Reported on 08/10/2021) 20 tablet 3   omega-3 acid ethyl esters (LOVAZA) 1 g capsule Take 1 capsule (1 g total) by mouth daily. 90 capsule 2   pregabalin (LYRICA) 50 MG capsule Take 1-2 capsules (50-100 mg total) by mouth at bedtime. 60 capsule 5   Semaglutide, 1 MG/DOSE, 4 MG/3ML SOPN Inject 1 mg as directed once a week. 9 mL 1   Sodium Fluoride (PREVIDENT 5000 BOOSTER PLUS) 1.1 % PSTE USE AS DIRECTED. 100 mL 3   Vitamin D, Ergocalciferol, (DRISDOL) 1.25 MG (50000 UNIT) CAPS capsule  TAKE 1 CAPSULE BY MOUTH ONCE A WEEK FOR 12 WEEKS 12 capsule 1   No current facility-administered medications for this visit.    Patient confirms/reports the following allergies:  Allergies  Allergen Reactions   Atorvastatin Other (See Comments)    A lot of cramping and joint pain   Hydromorphone Hcl Nausea And Vomiting    No orders of the defined types were placed in this encounter.   AUTHORIZATION INFORMATION Primary Insurance: 1D#: Group #:  Secondary Insurance: 1D#: Group #:  SCHEDULE  INFORMATION: Date: 10/11/21 Time: Location: [place]

## 2021-09-02 NOTE — Assessment & Plan Note (Signed)
Not under good control with a1c of 8.9. Will increase his ozempic to 1 mg and recheck 3 months. Continue diet and exercise. Call with any concerns.

## 2021-09-03 ENCOUNTER — Other Ambulatory Visit: Payer: Self-pay

## 2021-09-03 ENCOUNTER — Other Ambulatory Visit: Payer: Self-pay | Admitting: Family Medicine

## 2021-09-03 DIAGNOSIS — E0590 Thyrotoxicosis, unspecified without thyrotoxic crisis or storm: Secondary | ICD-10-CM

## 2021-09-03 DIAGNOSIS — E1165 Type 2 diabetes mellitus with hyperglycemia: Secondary | ICD-10-CM

## 2021-09-03 DIAGNOSIS — E782 Mixed hyperlipidemia: Secondary | ICD-10-CM

## 2021-09-03 LAB — CBC WITH DIFFERENTIAL/PLATELET
Basophils Absolute: 0.1 10*3/uL (ref 0.0–0.2)
Basos: 2 %
EOS (ABSOLUTE): 0.2 10*3/uL (ref 0.0–0.4)
Eos: 2 %
Hematocrit: 45.9 % (ref 37.5–51.0)
Hemoglobin: 15.8 g/dL (ref 13.0–17.7)
Immature Grans (Abs): 0.1 10*3/uL (ref 0.0–0.1)
Immature Granulocytes: 1 %
Lymphocytes Absolute: 2.6 10*3/uL (ref 0.7–3.1)
Lymphs: 33 %
MCH: 32.7 pg (ref 26.6–33.0)
MCHC: 34.4 g/dL (ref 31.5–35.7)
MCV: 95 fL (ref 79–97)
Monocytes Absolute: 0.5 10*3/uL (ref 0.1–0.9)
Monocytes: 6 %
Neutrophils Absolute: 4.4 10*3/uL (ref 1.4–7.0)
Neutrophils: 56 %
Platelets: 399 10*3/uL (ref 150–450)
RBC: 4.83 x10E6/uL (ref 4.14–5.80)
RDW: 13.5 % (ref 11.6–15.4)
WBC: 7.8 10*3/uL (ref 3.4–10.8)

## 2021-09-03 LAB — LIPID PANEL W/O CHOL/HDL RATIO
Cholesterol, Total: 274 mg/dL — ABNORMAL HIGH (ref 100–199)
HDL: 19 mg/dL — ABNORMAL LOW (ref >39)
Triglycerides: 1941 mg/dL (ref 0–149)

## 2021-09-03 LAB — COMPREHENSIVE METABOLIC PANEL
ALT: 29 IU/L (ref 0–44)
AST: 26 IU/L (ref 0–40)
Albumin/Globulin Ratio: 2.2 (ref 1.2–2.2)
Albumin: 5 g/dL — ABNORMAL HIGH (ref 3.8–4.9)
Alkaline Phosphatase: 68 IU/L (ref 44–121)
BUN/Creatinine Ratio: 14 (ref 9–20)
BUN: 8 mg/dL (ref 6–24)
Bilirubin Total: 0.3 mg/dL (ref 0.0–1.2)
CO2: 16 mmol/L — ABNORMAL LOW (ref 20–29)
Calcium: 9.6 mg/dL (ref 8.7–10.2)
Chloride: 98 mmol/L (ref 96–106)
Creatinine, Ser: 0.59 mg/dL — ABNORMAL LOW (ref 0.76–1.27)
Globulin, Total: 2.3 g/dL (ref 1.5–4.5)
Glucose: 201 mg/dL — ABNORMAL HIGH (ref 70–99)
Potassium: 4 mmol/L (ref 3.5–5.2)
Sodium: 138 mmol/L (ref 134–144)
Total Protein: 7.3 g/dL (ref 6.0–8.5)
eGFR: 117 mL/min/{1.73_m2} (ref >59)

## 2021-09-03 LAB — PSA: Prostate Specific Ag, Serum: 0.3 ng/mL (ref 0.0–4.0)

## 2021-09-03 LAB — TSH: TSH: 0.396 u[IU]/mL — ABNORMAL LOW (ref 0.450–4.500)

## 2021-09-03 LAB — VITAMIN D 25 HYDROXY (VIT D DEFICIENCY, FRACTURES): Vit D, 25-Hydroxy: 23.1 ng/mL — ABNORMAL LOW (ref 30.0–100.0)

## 2021-09-03 MED ORDER — REPATHA 140 MG/ML ~~LOC~~ SOSY
140.0000 mg | PREFILLED_SYRINGE | SUBCUTANEOUS | 3 refills | Status: DC
  Filled 2021-09-03: qty 2, 28d supply, fill #0

## 2021-09-05 ENCOUNTER — Other Ambulatory Visit: Payer: Self-pay | Admitting: Family Medicine

## 2021-09-05 ENCOUNTER — Other Ambulatory Visit: Payer: Self-pay

## 2021-09-06 NOTE — Telephone Encounter (Signed)
Requested medications are due for refill today.  yes  Requested medications are on the active medications list.  yes  Last refill. 03/16/2021 #12 1 refill  Future visit scheduled.   yes  Notes to clinic.  Refill is not delegated.    Requested Prescriptions  Pending Prescriptions Disp Refills   Vitamin D, Ergocalciferol, (DRISDOL) 1.25 MG (50000 UNIT) CAPS capsule [Pharmacy Med Name: Vitamin D, Ergocalciferol, (DRISDOL) 1.25 MG (50000 UNIT) Cap capsule] 12 capsule 1    Sig: TAKE 1 CAPSULE BY MOUTH ONCE A WEEK FOR 12 WEEKS     Endocrinology:  Vitamins - Vitamin D Supplementation 2 Failed - 09/05/2021  9:29 PM      Failed - Manual Review: Route requests for 50,000 IU strength to the provider      Failed - Vitamin D in normal range and within 360 days    Vit D, 25-Hydroxy  Date Value Ref Range Status  09/02/2021 23.1 (L) 30.0 - 100.0 ng/mL Final    Comment:    Vitamin D deficiency has been defined by the Institute of Medicine and an Endocrine Society practice guideline as a level of serum 25-OH vitamin D less than 20 ng/mL (1,2). The Endocrine Society went on to further define vitamin D insufficiency as a level between 21 and 29 ng/mL (2). 1. IOM (Institute of Medicine). 2010. Dietary reference    intakes for calcium and D. [place]: The    [employer]. 2. Holick MF, Binkley Gridley, Bischoff-Ferrari HA, et al.    Evaluation, treatment, and prevention of vitamin D    deficiency: an Endocrine Society clinical practice    guideline. JCEM. 2011 Jul; 96(7):1911-30.          Passed - Ca in normal range and within 360 days    Calcium  Date Value Ref Range Status  09/02/2021 9.6 8.7 - 10.2 mg/dL Final   Calcium, Total  Date Value Ref Range Status  02/11/2013 8.1 (L) 8.5 - 10.1 mg/dL Final         Passed - Valid encounter within last 12 months    Recent Outpatient Visits           4 days ago Routine general medical examination at a health care facility   [facility], [place] P, DO   3 months ago Type 2 diabetes mellitus with hyperglycemia, without long-term current use of insulin ([place])   [facility], Megan P, DO   8 months ago Type 2 diabetes mellitus with hyperglycemia, without long-term current use of insulin ([place])   [place], Megan P, DO   1 year ago Routine general medical examination at a health care facility   [facility], [place] P, DO   1 year ago Type 2 diabetes mellitus with hyperglycemia, without long-term current use of insulin [facility])   [place], [place], DO       Future Appointments             In 3 months Wynetta Emery, Barb Merino, DO [employer], [facility]

## 2021-09-07 ENCOUNTER — Other Ambulatory Visit: Payer: Self-pay

## 2021-09-08 ENCOUNTER — Other Ambulatory Visit: Payer: Self-pay | Admitting: Family Medicine

## 2021-09-08 ENCOUNTER — Other Ambulatory Visit: Payer: Self-pay

## 2021-09-09 NOTE — Telephone Encounter (Signed)
Requested medication (s) are due for refill today: yes  Requested medication (s) are on the active medication list: yes  Last refill:  03/16/21 #12 1 RF  Future visit scheduled: yes  Notes to clinic:  med not delegated to NT to RF   Requested Prescriptions  Pending Prescriptions Disp Refills   Vitamin D, Ergocalciferol, (DRISDOL) 1.25 MG (50000 UNIT) CAPS capsule 12 capsule 1    Sig: TAKE 1 CAPSULE BY MOUTH ONCE A WEEK FOR 12 WEEKS     Endocrinology:  Vitamins - Vitamin D Supplementation 2 Failed - 09/08/2021  2:20 PM      Failed - Manual Review: Route requests for 50,000 IU strength to the provider      Failed - Vitamin D in normal range and within 360 days    Vit D, 25-Hydroxy  Date Value Ref Range Status  09/02/2021 23.1 (L) 30.0 - 100.0 ng/mL Final    Comment:    Vitamin D deficiency has been defined by the Institute of Medicine and an Endocrine Society practice guideline as a level of serum 25-OH vitamin D less than 20 ng/mL (1,2). The Endocrine Society went on to further define vitamin D insufficiency as a level between 21 and 29 ng/mL (2). 1. IOM (Institute of Medicine). 2010. Dietary reference    intakes for calcium and D. [place]: The    [employer]. 2. Holick MF, Binkley [place], Bischoff-Ferrari HA, et al.    Evaluation, treatment, and prevention of vitamin D    deficiency: an Endocrine Society clinical practice    guideline. JCEM. 2011 Jul; 96(7):1911-30.          Passed - Ca in normal range and within 360 days    Calcium  Date Value Ref Range Status  09/02/2021 9.6 8.7 - 10.2 mg/dL Final   Calcium, Total  Date Value Ref Range Status  02/11/2013 8.1 (L) 8.5 - 10.1 mg/dL Final         Passed - Valid encounter within last 12 months    Recent Outpatient Visits           1 week ago Routine general medical examination at a health care facility   [facility], [place] P, DO   4 months ago Type 2 diabetes mellitus with  hyperglycemia, without long-term current use of insulin ([place])   [facility], Megan P, DO   8 months ago Type 2 diabetes mellitus with hyperglycemia, without long-term current use of insulin ([place])   [place], Megan P, DO   1 year ago Routine general medical examination at a health care facility   [facility], [place] P, DO   1 year ago Type 2 diabetes mellitus with hyperglycemia, without long-term current use of insulin [facility])   [place], [place], DO       Future Appointments             In 2 months Wynetta Emery, Barb Merino, DO [place], [facility]

## 2021-09-10 ENCOUNTER — Other Ambulatory Visit: Payer: Self-pay

## 2021-09-13 ENCOUNTER — Other Ambulatory Visit: Payer: Self-pay

## 2021-09-13 MED FILL — Ergocalciferol Cap 1.25 MG (50000 Unit): ORAL | 84 days supply | Qty: 12 | Fill #0 | Status: AC

## 2021-09-14 ENCOUNTER — Other Ambulatory Visit: Payer: Self-pay

## 2021-09-17 ENCOUNTER — Other Ambulatory Visit: Payer: Self-pay | Admitting: Family Medicine

## 2021-09-17 ENCOUNTER — Other Ambulatory Visit: Payer: Self-pay

## 2021-09-17 MED ORDER — PRAVASTATIN SODIUM 10 MG PO TABS
10.0000 mg | ORAL_TABLET | ORAL | 0 refills | Status: DC
Start: 2021-09-17 — End: 2022-09-22
  Filled 2021-09-17: qty 12, 84d supply, fill #0

## 2021-09-19 ENCOUNTER — Other Ambulatory Visit: Payer: Self-pay

## 2021-09-20 ENCOUNTER — Other Ambulatory Visit: Payer: Self-pay

## 2021-09-22 ENCOUNTER — Telehealth: Payer: Self-pay | Admitting: Student in an Organized Health Care Education/Training Program

## 2021-09-22 ENCOUNTER — Other Ambulatory Visit: Payer: Self-pay

## 2021-09-22 MED ORDER — AMOXICILLIN-POT CLAVULANATE 500-125 MG PO TABS
1.0000 | ORAL_TABLET | Freq: Three times a day (TID) | ORAL | 0 refills | Status: DC
  Filled 2021-09-22: qty 21, 7d supply, fill #0

## 2021-09-22 NOTE — Telephone Encounter (Signed)
PT stated that the pharmacy will be closed on Saturday. PT was told that if nurse will call and give the okay they will fill meds on Friday. Please give patient a call. Thanks

## 2021-09-23 ENCOUNTER — Other Ambulatory Visit: Payer: Self-pay

## 2021-09-23 NOTE — Telephone Encounter (Signed)
Spoke with pharmacy and notified tham that patient could get script filled a day early since the pharmacy was closed on Saturday.

## 2021-09-24 ENCOUNTER — Other Ambulatory Visit: Payer: Self-pay

## 2021-10-04 ENCOUNTER — Encounter: Payer: Self-pay | Admitting: Gastroenterology

## 2021-10-10 ENCOUNTER — Other Ambulatory Visit: Payer: Self-pay

## 2021-10-11 ENCOUNTER — Other Ambulatory Visit: Payer: Self-pay

## 2021-10-11 MED ORDER — METHOTREXATE 2.5 MG PO TABS
ORAL_TABLET | ORAL | 3 refills | Status: DC
  Filled 2021-10-11: qty 20, 28d supply, fill #0

## 2021-10-25 ENCOUNTER — Other Ambulatory Visit: Payer: Self-pay

## 2021-10-27 ENCOUNTER — Encounter: Payer: Self-pay | Admitting: Family Medicine

## 2021-11-02 ENCOUNTER — Encounter: Payer: Self-pay | Admitting: Student in an Organized Health Care Education/Training Program

## 2021-11-02 ENCOUNTER — Ambulatory Visit
Payer: 59 | Attending: Student in an Organized Health Care Education/Training Program | Admitting: Student in an Organized Health Care Education/Training Program

## 2021-11-02 ENCOUNTER — Other Ambulatory Visit: Payer: Self-pay

## 2021-11-02 VITALS — BP 137/85 | HR 86 | Temp 97.2°F | Ht 71.0 in | Wt 240.0 lb

## 2021-11-02 DIAGNOSIS — M542 Cervicalgia: Secondary | ICD-10-CM | POA: Insufficient documentation

## 2021-11-02 DIAGNOSIS — G894 Chronic pain syndrome: Secondary | ICD-10-CM | POA: Diagnosis not present

## 2021-11-02 DIAGNOSIS — Q761 Klippel-Feil syndrome: Secondary | ICD-10-CM | POA: Diagnosis not present

## 2021-11-02 DIAGNOSIS — M5412 Radiculopathy, cervical region: Secondary | ICD-10-CM | POA: Insufficient documentation

## 2021-11-02 DIAGNOSIS — Z981 Arthrodesis status: Secondary | ICD-10-CM | POA: Diagnosis not present

## 2021-11-02 MED ORDER — HYDROCODONE-ACETAMINOPHEN 7.5-325 MG PO TABS
1.0000 | ORAL_TABLET | Freq: Four times a day (QID) | ORAL | 0 refills | Status: DC | PRN
  Filled 2022-01-24: qty 120, 30d supply, fill #0
  Filled ????-??-?? (×2): fill #0

## 2021-11-02 MED ORDER — PREGABALIN 50 MG PO CAPS
50.0000 mg | ORAL_CAPSULE | Freq: Every day | ORAL | 5 refills | Status: DC
  Filled 2021-11-02: qty 60, 30d supply, fill #0

## 2021-11-02 MED ORDER — HYDROCODONE-ACETAMINOPHEN 7.5-325 MG PO TABS
1.0000 | ORAL_TABLET | Freq: Four times a day (QID) | ORAL | 0 refills | Status: AC | PRN
  Filled 2021-12-24: qty 35, 9d supply, fill #0
  Filled 2021-12-24: qty 85, 21d supply, fill #0

## 2021-11-02 MED ORDER — METHOCARBAMOL 500 MG PO TABS
500.0000 mg | ORAL_TABLET | Freq: Three times a day (TID) | ORAL | 2 refills | Status: DC | PRN
  Filled 2021-11-02: qty 90, 30d supply, fill #0

## 2021-11-02 MED ORDER — HYDROCODONE-ACETAMINOPHEN 7.5-325 MG PO TABS
1.0000 | ORAL_TABLET | Freq: Four times a day (QID) | ORAL | 0 refills | Status: AC | PRN
  Filled 2021-11-24: qty 120, 30d supply, fill #0

## 2021-11-02 NOTE — Progress Notes (Signed)
Nursing Pain Medication Assessment:  Safety precautions to be maintained throughout the outpatient stay will include: orient to surroundings, keep bed in low position, maintain call bell within reach at all times, provide assistance with transfer out of bed and ambulation.  Medication Inspection Compliance: Pill count conducted under aseptic conditions, in front of the patient. Neither the pills nor the bottle was removed from the patient's sight at any time. Once count was completed pills were immediately returned to the patient in their original bottle.  Medication: Hydrocodone/APAP Pill/Patch Count:  pt states he emptied his bubble pack filled 10/25/21 into  pill bottle Pill/Patch Appearance: Markings consistent with prescribed medication Bottle Appearance:  see above Filled Date: 10 / 16 / 2023 Last Medication intake:  TodaySafety precautions to be maintained throughout the outpatient stay will include: orient to surroundings, keep bed in low position, maintain call bell within reach at all times, provide assistance with transfer out of bed and ambulation.   Fill date per pt

## 2021-11-02 NOTE — Progress Notes (Signed)
PROVIDER NOTE: Information contained herein reflects review and annotations entered in association with encounter. Interpretation of such information and data should be left to medically-trained personnel. Information provided to patient can be located elsewhere in the medical record under "Patient Instructions". Document created using STT-dictation technology, any transcriptional errors that may result from process are unintentional.    Patient: Tony Cardenas  Service Category: E/M  Provider: Gillis Santa, MD  DOB: February 17, 1970  DOS: 11/02/2021  Referring Provider: Valerie Roys, DO  MRN: [id number]  Specialty: Interventional Pain Management  PCP: Valerie Roys, DO  Type: Established Patient  Setting: Ambulatory outpatient    Location: Office  Delivery: Face-to-face     HPI  Tony Cardenas, a 51 y.o. 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 [name] primary complain today is Back Pain (neck) Last encounter: My last encounter with him was on 09/22/2021. Pertinent problems: Mr. Streater has S/P cervical spinal fusion; Cervical radicular pain; Cervical fusion syndrome; Chronic pain syndrome; and Cervical spondylosis with myelopathy and radiculopathy on their pertinent problem list. Pain Assessment: Severity of Chronic pain is reported as a 9 /10. Location: Neck Right, Left/radiates to hands and fingers bilaterally more on right side. Onset: More than a month ago. Quality: Contraction, Aching, Dull. Timing: Constant. Modifying factor(s): meds, laying down. Vitals:  height is 5' 11"  (1.803 m) and weight is 240 lb (108.9 kg). His temporal temperature is 97.2 F (36.2 C) (abnormal). His blood pressure is 137/85 and his pulse is 86. His oxygen saturation is 100%.   Reason for encounter: medication management.  No change in medical history since last visit.  Patient's pain is at baseline.  Patient continues multimodal pain regimen as prescribed.   States that it provides pain relief and improvement in functional status.   Pharmacotherapy Assessment  Analgesic: Hydrocodone 7.5 mg QID PRN   Monitoring: [place] PMP: PDMP reviewed during this encounter.       Pharmacotherapy: No side-effects or adverse reactions reported. Compliance: No problems identified. Effectiveness: Clinically acceptable.  Arlice Colt, RN  11/02/2021  9:19 AM  Sign when Signing Visit Nursing Pain Medication Assessment:  Safety precautions to be maintained throughout the outpatient stay will include: orient to surroundings, keep bed in low position, maintain call bell within reach at all times, provide assistance with transfer out of bed and ambulation.  Medication Inspection Compliance: Pill count conducted under aseptic conditions, in front of the patient. Neither the pills nor the bottle was removed from the patient's sight at any time. Once count was completed pills were immediately returned to the patient in their original bottle.  Medication: Hydrocodone/APAP Pill/Patch Count:  pt states he emptied his bubble pack filled 10/25/21 into  pill bottle Pill/Patch Appearance: Markings consistent with prescribed medication Bottle Appearance:  see above Filled Date: 10 / 16 / 2023 Last Medication intake:  TodaySafety precautions to be maintained throughout the outpatient stay will include: orient to surroundings, keep bed in low position, maintain call bell within reach at all times, provide assistance with transfer out of bed and ambulation.   Fill date per pt    No results found for: "CBDTHCR" No results found for: "D8THCCBX" No results found for: "D9THCCBX"  UDS:  Summary  Date Value Ref Range Status  02/16/2021 Note  Final    Comment:    ==================================================================== [employer] [facility] 13 (MW) ==================================================================== Test  Result       Flag        Units  Drug Present and Declared for Prescription Verification   Hydrocodone                    540          EXPECTED   ng/mg creat   Hydromorphone                  604          EXPECTED   ng/mg creat   Dihydrocodeine                 192          EXPECTED   ng/mg creat   Norhydrocodone                 322          EXPECTED   ng/mg creat    Sources of hydrocodone include scheduled prescription medications.    Hydromorphone, dihydrocodeine and norhydrocodone are expected    metabolites of hydrocodone. Hydromorphone and dihydrocodeine are    also available as scheduled prescription medications.  ==================================================================== Test                      Result    Flag   Units      Ref Range   Creatinine              77               mg/dL      >=20 ==================================================================== Declared Medications:  The flagging and interpretation on this report are based on the  following declared medications.  Unexpected results may arise from  inaccuracies in the declared medications.   **Note: The testing scope of this panel includes these medications:   Hydrocodone (Norco)   **Note: The testing scope of this panel does not include the  following reported medications:   Acetaminophen (Norco)  Albuterol (Ventolin HFA)  Empagliflozin (Jardiance)  Ezetimibe (Zetia)  Fluticasone (Flonase)  Folic Acid  Lisinopril (Zestril)  Metformin  Methocarbamol (Robaxin)  Methotrexate  Omega-3 Fatty Acids  Pregabalin (Lyrica)  Vitamin D2 (Drisdol) ==================================================================== For clinical consultation, please call [phone]. ====================================================================       ROS  Constitutional: Denies any fever or chills Gastrointestinal: No reported hemesis, hematochezia, vomiting, or acute GI distress Musculoskeletal:  cervicalgia Neurological: No  reported episodes of acute onset apraxia, aphasia, dysarthria, agnosia, amnesia, paralysis, loss of coordination, or loss of consciousness  Medication Review  Evolocumab, HYDROcodone-acetaminophen, Semaglutide (1 MG/DOSE), Sodium Fluoride, Vitamin D (Ergocalciferol), albuterol, amoxicillin-clavulanate, blood glucose meter kit and supplies, empagliflozin, ezetimibe, fluticasone, folic acid, lisinopril, metFORMIN, methocarbamol, methotrexate, omega-3 acid ethyl esters, pravastatin, and pregabalin  History Review  Allergy: Mr. Vroom is allergic to atorvastatin and hydromorphone hcl. Drug: Mr. Zielinski  reports no history of drug use. Alcohol:  reports no history of alcohol use. Tobacco:  reports that he quit smoking about 2 years ago. His smoking use included cigarettes. He has a 10.00 pack-year smoking history. He quit smokeless tobacco use about 5 years ago.  His smokeless tobacco use included chew. Social: Mr. Strawser  reports that he quit smoking about 2 years ago. His smoking use included cigarettes. He has a 10.00 pack-year smoking history. He quit smokeless tobacco use about 5 years ago.  His smokeless tobacco use included chew. He reports that he does not drink alcohol and  does not use drugs. Medical:  has a past medical history of Arthritis, COPD (chronic obstructive pulmonary disease) ([place]), Diabetes mellitus without complication ([place]), GERD (gastroesophageal reflux disease), Hyperlipidemia, Hypertension, and Neck pain (1989). Surgical: Mr. Bollig  has a past surgical history that includes Neck surgery (1989); Knee surgery (Right); Colonoscopy (N/A, 06/02/2014); Polypectomy (06/02/2014); Appendectomy; Esophagogastroduodenoscopy (egd) with propofol (N/A, 07/29/2016); Back surgery; Anterior cervical corpectomy (N/A, 10/10/2018); Hernia repair (over 10 years ago); Anterior cervical corpectomy (N/A, 05/01/2019); and Posterior cervical fusion/foraminotomy (N/A, 05/01/2019). Family: family history  includes Alcohol abuse in his father; COPD in his father; Cancer in his mother; Diabetes in his brother, father, and paternal grandmother.  Laboratory Chemistry Profile   Renal Lab Results  Component Value Date   BUN 8 09/02/2021   CREATININE 0.59 (L) 09/02/2021   BCR 14 09/02/2021   GFRAA 114 12/16/2019   GFRNONAA >60 05/11/2021    Hepatic Lab Results  Component Value Date   AST 26 09/02/2021   ALT 29 09/02/2021   ALBUMIN 5.0 (H) 09/02/2021   ALKPHOS 68 09/02/2021   HCVAB NON REACTIVE 03/24/2020    Electrolytes Lab Results  Component Value Date   NA 138 09/02/2021   K 4.0 09/02/2021   CL 98 09/02/2021   CALCIUM 9.6 09/02/2021    Bone Lab Results  Component Value Date   VD25OH 23.1 (L) 09/02/2021    Inflammation (CRP: Acute Phase) (ESR: Chronic Phase) Lab Results  Component Value Date   CRP 0.7 05/26/2020   ESRSEDRATE 9 05/11/2021         Note: Above Lab results reviewed.  Recent Imaging Review  DG Chest 2 View CLINICAL DATA:  History of recent COVID-19 infection  EXAM: CHEST - 2 VIEW  COMPARISON:  04/24/2017  FINDINGS: Cardiac shadow is within normal limits. Patchy airspace opacities are noted in the bases bilaterally consistent with the given clinical history. No sizable effusion is seen. No acute bony abnormality is noted. Postsurgical changes of the cervical spine are noted.  IMPRESSION: Patchy airspace opacities in the bases bilaterally consistent with the given clinical history.  Electronically Signed   By: Inez Catalina M.D.   On: 01/13/2020 16:43 Note: Reviewed        Physical Exam  General appearance: Well nourished, well developed, and well hydrated. In no apparent acute distress Mental status: Alert, oriented x 3 (person, place, & time)       Respiratory: No evidence of acute respiratory distress Eyes: PERLA Vitals: BP 137/85 (BP Location: Right Arm, Patient Position: Sitting, Cuff Size: Large)   Pulse 86   Temp (!) 97.2 F (36.2  C) (Temporal)   Ht 5' 11"  (1.803 m)   Wt 240 lb (108.9 kg)   SpO2 100%   BMI 33.47 kg/m  BMI: Estimated body mass index is 33.47 kg/m as calculated from the following:   Height as of this encounter: 5' 11"  (1.803 m).   Weight as of this encounter: 240 lb (108.9 kg). Ideal: Ideal body weight: 75.3 kg (166 lb 0.1 oz) Adjusted ideal body weight: 88.7 kg (195 lb 9.7 oz)  Neuropathic pain pattern of upper extremity, persistent Neuropathic pain of bilateral lower extremity   5 out of 5 strength bilateral upper extremity: Shoulder abduction, elbow flexion, elbow extension, thumb extension.   Assessment   Diagnosis Status  1. Cervical fusion syndrome   2. Cervical radicular pain   3. Cervicalgia   4. S/P cervical spinal fusion   5. Chronic pain syndrome  Controlled Controlled Controlled    Plan of Care   Mr. AARO MEYERS has a current medication list which includes the following long-term medication(s): albuterol, ezetimibe, fluticasone, lisinopril, metformin, omega-3 acid ethyl esters, pravastatin, and pregabalin.  Pharmacotherapy (Medications Ordered): Meds ordered this encounter  Medications   HYDROcodone-acetaminophen (NORCO) 7.5-325 MG tablet    Sig: Take 1 tablet by mouth every 6 (six) hours as needed for moderate pain.    Dispense:  120 tablet    Refill:  0   HYDROcodone-acetaminophen (NORCO) 7.5-325 MG tablet    Sig: Take 1 tablet by mouth every 6 (six) hours as needed for moderate pain.    Dispense:  120 tablet    Refill:  0   HYDROcodone-acetaminophen (NORCO) 7.5-325 MG tablet    Sig: Take 1 tablet by mouth every 6 (six) hours as needed for moderate pain.    Dispense:  120 tablet    Refill:  0   methocarbamol (ROBAXIN) 500 MG tablet    Sig: Take 1 tablet (500 mg total) by mouth every 8 (eight) hours as needed for muscle spasms.    Dispense:  90 tablet    Refill:  2    Do not place this medication, or any other prescription from our practice, on  "Automatic Refill". Patient may have prescription filled one day early if pharmacy is closed on scheduled refill date.   pregabalin (LYRICA) 50 MG capsule    Sig: Take 1-2 capsules (50-100 mg total) by mouth at bedtime.    Dispense:  60 capsule    Refill:  5     Follow-up plan:   Return in about 3 months (around 02/02/2022) for Medication Management, in person.    Recent Visits Date Type Provider Dept  08/10/21 Office Visit Gillis Santa, MD [facility]  Showing recent visits within past 90 days and meeting all other requirements Today's Visits Date Type Provider Dept  11/02/21 Office Visit Gillis Santa, MD [facility]  Showing today's visits and meeting all other requirements Future Appointments No visits were found meeting these conditions. Showing future appointments within next 90 days and meeting all other requirements  I discussed the assessment and treatment plan with the patient. The patient was provided an opportunity to ask questions and all were answered. The patient agreed with the plan and demonstrated an understanding of the instructions.  Patient advised to call back or seek an in-person evaluation if the symptoms or condition worsens.  Duration of encounter: 51mnutes.  Total time on encounter, as per AMA guidelines included both the face-to-face and non-face-to-face time personally spent by the physician and/or other qualified health care professional(s) on the day of the encounter (includes time in activities that require the physician or other qualified health care professional and does not include time in activities normally performed by clinical staff). Physician's time may include the following activities when performed: preparing to see the patient (eg, review of tests, pre-charting review of records) obtaining and/or reviewing separately obtained history performing a medically appropriate examination and/or evaluation counseling and  educating the patient/family/caregiver ordering medications, tests, or procedures referring and communicating with other health care professionals (when not separately reported) documenting clinical information in the electronic or other health record independently interpreting results (not separately reported) and communicating results to the patient/ family/caregiver care coordination (not separately reported)  Note by: BGillis Santa MD Date: 11/02/2021; Time: 9:38 AM

## 2021-11-04 ENCOUNTER — Encounter: Payer: 59 | Admitting: Student in an Organized Health Care Education/Training Program

## 2021-11-07 ENCOUNTER — Other Ambulatory Visit: Payer: Self-pay

## 2021-11-08 ENCOUNTER — Other Ambulatory Visit: Payer: Self-pay

## 2021-11-08 MED ORDER — METHOTREXATE SODIUM 2.5 MG PO TABS
ORAL_TABLET | ORAL | 3 refills | Status: DC
  Filled 2021-11-08: qty 20, 28d supply, fill #0
  Filled 2021-12-05: qty 20, 28d supply, fill #1

## 2021-11-09 ENCOUNTER — Encounter: Payer: Self-pay | Admitting: Gastroenterology

## 2021-11-11 ENCOUNTER — Other Ambulatory Visit
Admission: RE | Admit: 2021-11-11 | Discharge: 2021-11-11 | Disposition: A | Discharge status: Home / Self Care | Payer: 59 | Attending: Rheumatology | Admitting: Rheumatology

## 2021-11-11 ENCOUNTER — Encounter: Payer: 59 | Admitting: Student in an Organized Health Care Education/Training Program

## 2021-11-11 ENCOUNTER — Encounter: Payer: 59 | Admitting: Family Medicine

## 2021-11-11 DIAGNOSIS — Z79899 Other long term (current) drug therapy: Secondary | ICD-10-CM | POA: Diagnosis not present

## 2021-11-11 DIAGNOSIS — M0609 Rheumatoid arthritis without rheumatoid factor, multiple sites: Secondary | ICD-10-CM | POA: Insufficient documentation

## 2021-11-11 LAB — CBC WITH DIFFERENTIAL/PLATELET
Abs Immature Granulocytes: 0.07 10*3/uL (ref 0.00–0.07)
Basophils Absolute: 0.1 10*3/uL (ref 0.0–0.1)
Basophils Relative: 2 %
Eosinophils Absolute: 0.2 10*3/uL (ref 0.0–0.5)
Eosinophils Relative: 2 %
HCT: 41.2 % (ref 39.0–52.0)
Hemoglobin: 15 g/dL (ref 13.0–17.0)
Immature Granulocytes: 1 %
Lymphocytes Relative: 37 %
Lymphs Abs: 2.9 10*3/uL (ref 0.7–4.0)
MCH: 32.9 pg (ref 26.0–34.0)
MCHC: 36.4 g/dL — ABNORMAL HIGH (ref 30.0–36.0)
MCV: 90.4 fL (ref 80.0–100.0)
Monocytes Absolute: 0.4 10*3/uL (ref 0.1–1.0)
Monocytes Relative: 5 %
Neutro Abs: 4.2 10*3/uL (ref 1.7–7.7)
Neutrophils Relative %: 53 %
Platelets: 408 10*3/uL — ABNORMAL HIGH (ref 150–400)
RBC: 4.56 MIL/uL (ref 4.22–5.81)
RDW: 13.1 % (ref 11.5–15.5)
WBC: 7.8 10*3/uL (ref 4.0–10.5)
nRBC: 0 % (ref 0.0–0.2)

## 2021-11-11 LAB — ALT: ALT: 26 U/L (ref 0–44)

## 2021-11-11 LAB — CREATININE, SERUM
Creatinine, Ser: 0.91 mg/dL (ref 0.61–1.24)
GFR, Estimated: >60 mL/min (ref >60)

## 2021-11-11 LAB — SEDIMENTATION RATE: Sed Rate: 31 mm/hr — ABNORMAL HIGH (ref 0–20)

## 2021-11-11 LAB — ALBUMIN: Albumin: 4.4 g/dL (ref 3.5–5.0)

## 2021-11-11 LAB — AST: AST: 34 U/L (ref 15–41)

## 2021-11-12 DIAGNOSIS — M79661 Pain in right lower leg: Secondary | ICD-10-CM | POA: Diagnosis not present

## 2021-11-14 ENCOUNTER — Other Ambulatory Visit: Payer: Self-pay | Admitting: Family Medicine

## 2021-11-15 ENCOUNTER — Encounter: Payer: Self-pay | Admitting: Gastroenterology

## 2021-11-15 ENCOUNTER — Ambulatory Visit: Payer: 59 | Admitting: Anesthesiology

## 2021-11-15 ENCOUNTER — Other Ambulatory Visit: Payer: Self-pay

## 2021-11-15 ENCOUNTER — Ambulatory Visit
Admission: RE | Admit: 2021-11-15 | Discharge: 2021-11-15 | Disposition: A | Discharge status: Home / Self Care | Payer: 59 | Attending: Gastroenterology | Admitting: Gastroenterology

## 2021-11-15 ENCOUNTER — Ambulatory Visit
Admission: RE | Disposition: A | Discharge status: Home / Self Care | Payer: Self-pay | Source: Home / Self Care | Attending: Gastroenterology

## 2021-11-15 DIAGNOSIS — Z833 Family history of diabetes mellitus: Secondary | ICD-10-CM | POA: Diagnosis not present

## 2021-11-15 DIAGNOSIS — J449 Chronic obstructive pulmonary disease, unspecified: Secondary | ICD-10-CM | POA: Diagnosis not present

## 2021-11-15 DIAGNOSIS — Z1211 Encounter for screening for malignant neoplasm of colon: Secondary | ICD-10-CM | POA: Diagnosis not present

## 2021-11-15 DIAGNOSIS — E119 Type 2 diabetes mellitus without complications: Secondary | ICD-10-CM | POA: Insufficient documentation

## 2021-11-15 DIAGNOSIS — K635 Polyp of colon: Secondary | ICD-10-CM

## 2021-11-15 DIAGNOSIS — Z860100 Personal history of colon polyps, unspecified: Secondary | ICD-10-CM

## 2021-11-15 DIAGNOSIS — T7840XA Allergy, unspecified, initial encounter: Secondary | ICD-10-CM | POA: Diagnosis not present

## 2021-11-15 DIAGNOSIS — Z87891 Personal history of nicotine dependence: Secondary | ICD-10-CM | POA: Insufficient documentation

## 2021-11-15 DIAGNOSIS — K621 Rectal polyp: Secondary | ICD-10-CM | POA: Diagnosis not present

## 2021-11-15 DIAGNOSIS — I10 Essential (primary) hypertension: Secondary | ICD-10-CM | POA: Diagnosis not present

## 2021-11-15 DIAGNOSIS — M1990 Unspecified osteoarthritis, unspecified site: Secondary | ICD-10-CM | POA: Insufficient documentation

## 2021-11-15 DIAGNOSIS — K219 Gastro-esophageal reflux disease without esophagitis: Secondary | ICD-10-CM | POA: Insufficient documentation

## 2021-11-15 DIAGNOSIS — Z7984 Long term (current) use of oral hypoglycemic drugs: Secondary | ICD-10-CM | POA: Diagnosis not present

## 2021-11-15 HISTORY — PX: COLONOSCOPY WITH PROPOFOL: SHX5780

## 2021-11-15 LAB — GLUCOSE, CAPILLARY
Glucose-Capillary: 256 mg/dL — ABNORMAL HIGH (ref 70–99)
Glucose-Capillary: 222 mg/dL — ABNORMAL HIGH (ref 70–99)

## 2021-11-15 SURGERY — COLONOSCOPY WITH PROPOFOL
Anesthesia: Monitor Anesthesia Care

## 2021-11-15 MED ORDER — SODIUM CHLORIDE 0.9 % IV SOLN: INTRAVENOUS | Status: DC

## 2021-11-15 MED ORDER — LIDOCAINE HCL (CARDIAC) PF 100 MG/5ML IV SOSY
PREFILLED_SYRINGE | INTRAVENOUS | Status: DC | PRN
  Administered 2021-11-15: 100 mg via INTRAVENOUS

## 2021-11-15 MED ORDER — INSULIN REGULAR HUMAN 100 UNIT/ML IJ SOLN
5.0000 [IU] | Freq: Once | INTRAMUSCULAR | Status: AC
  Administered 2021-11-15: 5 [IU] via SUBCUTANEOUS

## 2021-11-15 MED ORDER — EZETIMIBE 10 MG PO TABS
ORAL_TABLET | Freq: Every day | ORAL | 0 refills | Status: DC
  Filled 2021-11-15: qty 90, 90d supply, fill #0

## 2021-11-15 MED ORDER — STERILE WATER FOR IRRIGATION IR SOLN
Status: DC | PRN
  Administered 2021-11-15: 1000 mL

## 2021-11-15 MED ORDER — PROPOFOL 10 MG/ML IV BOLUS
INTRAVENOUS | Status: DC | PRN
  Administered 2021-11-15: 50 mg via INTRAVENOUS
  Administered 2021-11-15: 40 mg via INTRAVENOUS
  Administered 2021-11-15: 90 mg via INTRAVENOUS
  Administered 2021-11-15: 60 mg via INTRAVENOUS
  Administered 2021-11-15: 90 mg via INTRAVENOUS
  Administered 2021-11-15: 80 mg via INTRAVENOUS
  Administered 2021-11-15: 40 mg via INTRAVENOUS

## 2021-11-15 SURGICAL SUPPLY — 21 items
CLIP HMST 235XBRD CATH ROT (MISCELLANEOUS) IMPLANT
CLIP RESOLUTION 360 11X235 (MISCELLANEOUS)
ELECT REM PT RETURN 9FT ADLT (ELECTROSURGICAL)
ELECTRODE REM PT RTRN 9FT ADLT (ELECTROSURGICAL) IMPLANT
FORCEPS BIOP RAD 4 LRG CAP 4 (CUTTING FORCEPS) IMPLANT
GOWN CVR UNV OPN BCK APRN NK (MISCELLANEOUS) ×4 IMPLANT
GOWN ISOL THUMB LOOP REG UNIV (MISCELLANEOUS) ×2
INJECTOR VARIJECT VIN23 (MISCELLANEOUS) IMPLANT
KIT DEFENDO VALVE AND CONN (KITS) IMPLANT
KIT PRC NS LF DISP ENDO (KITS) ×2 IMPLANT
KIT PROCEDURE OLYMPUS (KITS) ×1
MANIFOLD NEPTUNE II (INSTRUMENTS) ×2 IMPLANT
MARKER SPOT ENDO TATTOO 5ML (MISCELLANEOUS) IMPLANT
PROBE APC STR FIRE (PROBE) IMPLANT
RETRIEVER NET ROTH 2.5X230 LF (MISCELLANEOUS) IMPLANT
SNARE COLD EXACTO (MISCELLANEOUS) IMPLANT
SNARE SHORT THROW 13M SML OVAL (MISCELLANEOUS) IMPLANT
SNARE SNG USE RND 15MM (INSTRUMENTS) IMPLANT
TRAP ETRAP POLY (MISCELLANEOUS) IMPLANT
VARIJECT INJECTOR VIN23 (MISCELLANEOUS)
WATER STERILE IRR 250ML POUR (IV SOLUTION) ×2 IMPLANT
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
## 2021-11-15 NOTE — Telephone Encounter (Signed)
Requested Prescriptions  Pending Prescriptions Disp Refills   ezetimibe (ZETIA) 10 MG tablet 90 tablet 0    Sig: TAKE 1 TABLET BY MOUTH DAILY.     Cardiovascular:  Antilipid - Sterol Transport Inhibitors Failed - 11/14/2021 11:45 AM      Failed - Lipid Panel in normal range within the last 12 months    Cholesterol, Total  Date Value Ref Range Status  09/02/2021 274 (H) 100 - 199 mg/dL Final   Cholesterol [place], [place]  Date Value Ref Range Status  06/22/2015 WILL FOLLOW  Preliminary   LDL Chol Calc (NIH)  Date Value Ref Range Status  09/02/2021 Comment (A) 0 - 99 mg/dL Final    Comment:    Triglyceride result indicated is too high for an accurate LDL cholesterol estimation.    HDL  Date Value Ref Range Status  09/02/2021 19 (L) >39 mg/dL Final   Triglycerides  Date Value Ref Range Status  09/02/2021 1,941 (HH) 0 - 149 mg/dL Final    Comment:    Results confirmed on dilution.    Triglycerides Piccolo,Waived  Date Value Ref Range Status  06/22/2015 WILL FOLLOW  Preliminary         Passed - AST in normal range and within 360 days    AST  Date Value Ref Range Status  11/11/2021 34 15 - 41 U/L Final    Comment:    Performed at [facility], [address] [place], [place] [address]   SGOT(AST)  Date Value Ref Range Status  02/11/2013 46 (H) 15 - 37 Unit/L Final         Passed - ALT in normal range and within 360 days    ALT  Date Value Ref Range Status  11/11/2021 26 0 - 44 U/L Final    Comment:    Performed at [facility], [address] [place], [place] [address]   SGPT (ALT)  Date Value Ref Range Status  02/11/2013 51 12 - 78 U/L Final         Passed - Patient is not pregnant      Passed - Valid encounter within last 12 months    Recent Outpatient Visits           2 months ago Routine general medical examination at a health care facility   [facility], [place] P, DO   6 months ago Type 2 diabetes  mellitus with hyperglycemia, without long-term current use of insulin ([place])   [facility], [place] P, DO   10 months ago Type 2 diabetes mellitus with hyperglycemia, without long-term current use of insulin ([place])   [place], [place] P, DO   1 year ago Routine general medical examination at a health care facility   [facility], [place] P, DO   1 year ago Type 2 diabetes mellitus with hyperglycemia, without long-term current use of insulin [facility])   [place], [place], DO       Future Appointments             In 3 weeks Wynetta Emery, Barb Merino, DO [place], [facility]

## 2021-11-15 NOTE — Anesthesia Preprocedure Evaluation (Addendum)
Anesthesia Evaluation  Patient identified by MRN, date of birth, ID band Patient awake    Reviewed: Allergy & Precautions, NPO status , Patient's Chart, lab work & pertinent test results  Airway Mallampati: II  TM Distance: >3 FB Neck ROM: Full    Dental   Pulmonary COPD, former smoker   Pulmonary exam normal        Cardiovascular hypertension, Normal cardiovascular exam     Neuro/Psych    GI/Hepatic ,GERD  ,,  Endo/Other  diabetes    Renal/GU      Musculoskeletal  (+) Arthritis ,    Abdominal   Peds  Hematology   Anesthesia Other Findings   Reproductive/Obstetrics                             Anesthesia Physical Anesthesia Plan  ASA: III  Anesthesia Plan: MAC   Post-op Pain Management:    Induction: Intravenous  PONV Risk Score and Plan: 2 and TIVA and Propofol infusion  Airway Management Planned: Nasal Cannula  Additional Equipment:   Intra-op Plan:   Post-operative Plan:   Informed Consent: I have reviewed the patients History and Physical, chart, labs and discussed the procedure including the risks, benefits and alternatives for the proposed anesthesia with the patient or authorized representative who has indicated his/her understanding and acceptance.     Dental Advisory Given  Plan Discussed with: CRNA  Anesthesia Plan Comments: (Patient consented for risks of anesthesia including but not limited to:  - adverse reactions to medications - risk of airway placement if required - damage to eyes, teeth, lips or other oral mucosa - nerve damage due to positioning  - sore throat or hoarseness - Damage to heart, brain, nerves, lungs, other parts of body or loss of life  Patient voiced understanding.)        Anesthesia Quick Evaluation

## 2021-11-15 NOTE — Op Note (Signed)
[facility] Gastroenterology Patient Name: Tony Cardenas Procedure Date: 11/15/2021 7:47 AM MRN: [id number] Account #: [id number] Date of Birth: 05/20/1970 Admit Type: Outpatient Age: 51 Room: [facility] OR ROOM 01 Gender: Male Note Status: Finalized Instrument Name: [id number] Procedure:             Colonoscopy Indications:           High risk colon cancer surveillance: Personal history                         of colonic polyps Providers:             Lucilla Lame MD, MD Referring MD:          Valerie Roys (Referring MD) Complications:         No immediate complications. Procedure:             Pre-Anesthesia Assessment:                        - Prior to the procedure, a History and Physical was                         performed, and patient medications and allergies were                         reviewed. The patient's tolerance of previous                         anesthesia was also reviewed. The risks and benefits                         of the procedure and the sedation options and risks                         were discussed with the patient. All questions were                         answered, and informed consent was obtained. Prior                         Anticoagulants: The patient has taken no anticoagulant                         or antiplatelet agents. ASA Grade Assessment: II - A                         patient with mild systemic disease. After reviewing                         the risks and benefits, the patient was deemed in                         satisfactory condition to undergo the procedure.                        After obtaining informed consent, the colonoscope was                         passed under direct vision. Throughout the  procedure,                         the patient's blood pressure, pulse, and oxygen                         saturations were monitored continuously. The                         Colonoscope was introduced through the anus  and                         advanced to the the cecum, identified by appendiceal                         orifice and ileocecal valve. The colonoscopy was                         performed without difficulty. The patient tolerated                         the procedure well. The quality of the bowel                         preparation was adequate to identify polyps. Findings:      The perianal and digital rectal examinations were normal.      A 3 mm polyp was found in the sigmoid colon. The polyp was sessile. The       polyp was removed with a cold biopsy forceps. Resection and retrieval       were complete.      A 4 mm polyp was found in the rectum. The polyp was sessile. The polyp       was removed with a cold biopsy forceps. Resection and retrieval were       complete. Impression:            - One 3 mm polyp in the sigmoid colon, removed with a                         cold biopsy forceps. Resected and retrieved.                        - One 4 mm polyp in the rectum, removed with a cold                         biopsy forceps. Resected and retrieved. Recommendation:        - Await pathology results.                        - Repeat colonoscopy in 5 years for surveillance. Procedure Code(s):     --- Professional ---                        [phone], Colonoscopy, flexible; with biopsy, single or                         multiple Diagnosis Code(s):     --- Professional ---  Z86.010, Personal history of colonic polyps                        D12.5, Benign neoplasm of sigmoid colon CPT copyright 2022 American Medical Association. All rights reserved. The codes documented in this report are preliminary and upon coder review may  be revised to meet current compliance requirements. Lucilla Lame MD, MD 11/15/2021 8:17:17 AM This report has been signed electronically. Number of Addenda: 0 Note Initiated On: 11/15/2021 7:47 AM Scope Withdrawal Time: 0 hours 11 minutes 1 second   Total Procedure Duration: 0 hours 15 minutes 35 seconds  Estimated Blood Loss:  Estimated blood loss: none.      [facility]

## 2021-11-15 NOTE — Transfer of Care (Signed)
Immediate Anesthesia Transfer of Care Note  Patient: Tony Cardenas  Procedure(s) Performed: COLONOSCOPY WITH PROPOFOL  Patient Location: PACU  Anesthesia Type: MAC  Level of Consciousness: awake, alert  and patient cooperative  Airway and Oxygen Therapy: Patient Spontanous Breathing and Patient connected to supplemental oxygen  Post-op Assessment: Post-op Vital signs reviewed, Patient's Cardiovascular Status Stable, Respiratory Function Stable, Patent Airway and No signs of Nausea or vomiting  Post-op Vital Signs: Reviewed and stable  Complications: No notable events documented.

## 2021-11-15 NOTE — Anesthesia Postprocedure Evaluation (Signed)
Anesthesia Post Note  Patient: Tony Cardenas  Procedure(s) Performed: COLONOSCOPY WITH PROPOFOL WITH POLYPECTOMY  Patient location during evaluation: PACU Anesthesia Type: General Level of consciousness: awake and alert Pain management: pain level controlled Vital Signs Assessment: post-procedure vital signs reviewed and stable Respiratory status: spontaneous breathing, nonlabored ventilation, respiratory function stable and patient connected to nasal cannula oxygen Cardiovascular status: blood pressure returned to baseline and stable Postop Assessment: no apparent nausea or vomiting Anesthetic complications: no  No notable events documented.   Last Vitals:  Vitals:   11/15/21 0818 11/15/21 0826  BP: 110/66 106/72  Pulse: 87 85  Resp: 16 15  Temp: 36.6 C   SpO2: 94% 94%    Last Pain:  Vitals:   11/15/21 0826  PainSc: 0-No pain                 Dimas Millin

## 2021-11-16 ENCOUNTER — Encounter: Payer: Self-pay | Admitting: Gastroenterology

## 2021-11-16 LAB — SURGICAL PATHOLOGY

## 2021-11-19 ENCOUNTER — Other Ambulatory Visit: Payer: Self-pay

## 2021-11-24 ENCOUNTER — Other Ambulatory Visit: Payer: Self-pay

## 2021-12-05 MED FILL — Ergocalciferol Cap 1.25 MG (50000 Unit): ORAL | 84 days supply | Qty: 12 | Fill #1 | Status: AC

## 2021-12-06 ENCOUNTER — Other Ambulatory Visit: Payer: Self-pay

## 2021-12-07 ENCOUNTER — Ambulatory Visit: Payer: 59 | Admitting: Family Medicine

## 2021-12-10 NOTE — Patient Instructions (Signed)

## 2021-12-13 ENCOUNTER — Ambulatory Visit (INDEPENDENT_AMBULATORY_CARE_PROVIDER_SITE_OTHER): Payer: 59 | Admitting: Nurse Practitioner

## 2021-12-13 ENCOUNTER — Other Ambulatory Visit: Payer: Self-pay

## 2021-12-13 ENCOUNTER — Ambulatory Visit
Admission: RE | Admit: 2021-12-13 | Discharge: 2021-12-13 | Disposition: A | Discharge status: Home / Self Care | Payer: 59 | Source: Ambulatory Visit | Attending: Nurse Practitioner | Admitting: Nurse Practitioner

## 2021-12-13 ENCOUNTER — Encounter: Payer: Self-pay | Admitting: Nurse Practitioner

## 2021-12-13 ENCOUNTER — Other Ambulatory Visit
Admission: RE | Admit: 2021-12-13 | Discharge: 2021-12-13 | Disposition: A | Discharge status: Home / Self Care | Payer: 59 | Source: Home / Self Care | Attending: Nurse Practitioner | Admitting: Nurse Practitioner

## 2021-12-13 VITALS — BP 112/69 | HR 85 | Ht 71.0 in | Wt 242.6 lb

## 2021-12-13 DIAGNOSIS — R09A2 Foreign body sensation, throat: Secondary | ICD-10-CM

## 2021-12-13 DIAGNOSIS — I10 Essential (primary) hypertension: Secondary | ICD-10-CM

## 2021-12-13 DIAGNOSIS — R7989 Other specified abnormal findings of blood chemistry: Secondary | ICD-10-CM | POA: Insufficient documentation

## 2021-12-13 DIAGNOSIS — E049 Nontoxic goiter, unspecified: Secondary | ICD-10-CM | POA: Diagnosis not present

## 2021-12-13 DIAGNOSIS — E782 Mixed hyperlipidemia: Secondary | ICD-10-CM

## 2021-12-13 DIAGNOSIS — E119 Type 2 diabetes mellitus without complications: Secondary | ICD-10-CM | POA: Diagnosis not present

## 2021-12-13 DIAGNOSIS — E559 Vitamin D deficiency, unspecified: Secondary | ICD-10-CM | POA: Diagnosis not present

## 2021-12-13 LAB — POCT GLYCOSYLATED HEMOGLOBIN (HGB A1C): Hemoglobin A1C: 10 % — AB (ref 4.0–5.6)

## 2021-12-13 LAB — TSH: TSH: 0.579 u[IU]/mL (ref 0.350–4.500)

## 2021-12-13 LAB — T4, FREE: Free T4: 0.81 ng/dL (ref 0.61–1.12)

## 2021-12-13 MED ORDER — GLUCOSE BLOOD VI STRP
ORAL_STRIP | 0 refills | Status: DC
  Filled 2021-12-13: qty 100, 50d supply, fill #0

## 2021-12-13 MED ORDER — METFORMIN HCL ER 500 MG PO TB24: 1000.0000 mg | ORAL_TABLET | Freq: Every day | ORAL | 3 refills | Status: DC

## 2021-12-13 MED ORDER — BLOOD GLUCOSE METER KIT
PACK | 0 refills | Status: AC
  Filled 2021-12-13: qty 1, 30d supply, fill #0

## 2021-12-13 MED ORDER — FREESTYLE LANCETS MISC
0 refills | Status: DC
  Filled 2021-12-13: qty 100, 50d supply, fill #0
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
## 2021-12-14 LAB — T3, FREE: T3, Free: 2.9 pg/mL (ref 2.0–4.4)

## 2021-12-14 LAB — THYROID PEROXIDASE ANTIBODY: Thyroperoxidase Ab SerPl-aCnc: 19 IU/mL (ref 0–34)

## 2021-12-14 LAB — THYROGLOBULIN ANTIBODY: Thyroglobulin Antibody: <1 IU/mL (ref 0.0–0.9)

## 2021-12-17 ENCOUNTER — Encounter: Payer: Self-pay | Admitting: Family Medicine

## 2021-12-17 ENCOUNTER — Ambulatory Visit: Payer: 59 | Admitting: Family Medicine

## 2021-12-17 ENCOUNTER — Other Ambulatory Visit: Payer: Self-pay

## 2021-12-17 VITALS — BP 121/70 | HR 91 | Temp 98.2°F | Wt 244.6 lb

## 2021-12-17 DIAGNOSIS — E782 Mixed hyperlipidemia: Secondary | ICD-10-CM | POA: Diagnosis not present

## 2021-12-17 DIAGNOSIS — E1165 Type 2 diabetes mellitus with hyperglycemia: Secondary | ICD-10-CM

## 2021-12-17 DIAGNOSIS — Z23 Encounter for immunization: Secondary | ICD-10-CM | POA: Diagnosis not present

## 2021-12-17 NOTE — Progress Notes (Signed)
BP 121/70   Pulse 91   Temp 98.2 F (36.8 C) (Oral)   Wt 244 lb 9.6 oz (110.9 kg)   SpO2 96%   BMI 34.11 kg/m    Subjective:    Patient ID: Tony Cardenas, male    DOB: Dec 22, 1970, 51 y.o.   MRN: [id number]  HPI: Tony Cardenas is a 51 y.o. male  No chief complaint on file.  DIABETES Hypoglycemic episodes:no Polydipsia/polyuria: yes Visual disturbance: yes Chest pain: no Paresthesias: yes Glucose Monitoring: yes  Accucheck frequency: BID- still running "pretty high"   AM- 120-130s  PM- "higher" Taking Insulin?: no Blood Pressure Monitoring: not checking Retinal Examination: Up to Date Foot Exam: Up to Date Diabetic Education: Completed Pneumovax: Up to Date Influenza:  administered today Aspirin: no  HYPERLIPIDEMIA Hyperlipidemia status: excellent compliance Satisfied with current treatment?  yes Side effects:  no Medication compliance: excellent compliance Past cholesterol meds: pravastatin Supplements: none Aspirin:  no The ASCVD Risk score (Arnett DK, et al., 2019) failed to calculate for the following reasons:   The valid HDL cholesterol range is 20 to 100 mg/dL Chest pain:  no Coronary artery disease:  no   Relevant past medical, surgical, family and social history reviewed and updated as indicated. Interim medical history since our last visit reviewed. Allergies and medications reviewed and updated.  Review of Systems  Constitutional:  Positive for fatigue. Negative for activity change, appetite change, chills, diaphoresis, fever and unexpected weight change.  Respiratory: Negative.    Cardiovascular: Negative.   Gastrointestinal: Negative.   Musculoskeletal:  Positive for arthralgias, back pain, myalgias and neck pain. Negative for gait problem, joint swelling and neck stiffness.  Skin: Negative.   Neurological: Negative.   Psychiatric/Behavioral: Negative.      Per HPI unless specifically indicated above     Objective:    BP  121/70   Pulse 91   Temp 98.2 F (36.8 C) (Oral)   Wt 244 lb 9.6 oz (110.9 kg)   SpO2 96%   BMI 34.11 kg/m   Wt Readings from Last 3 Encounters:  12/17/21 244 lb 9.6 oz (110.9 kg)  12/13/21 242 lb 9.6 oz (110 kg)  11/15/21 238 lb (108 kg)    Physical Exam Vitals and nursing note reviewed.  Constitutional:      General: He is not in acute distress.    Appearance: Normal appearance. He is obese. He is not ill-appearing, toxic-appearing or diaphoretic.  HENT:     Head: Normocephalic and atraumatic.     Right Ear: External ear normal.     Left Ear: External ear normal.     Nose: Nose normal.     Mouth/Throat:     Mouth: Mucous membranes are moist.     Pharynx: Oropharynx is clear.  Eyes:     General: No scleral icterus.       Right eye: No discharge.        Left eye: No discharge.     Extraocular Movements: Extraocular movements intact.     Conjunctiva/sclera: Conjunctivae normal.     Pupils: Pupils are equal, round, and reactive to light.  Cardiovascular:     Rate and Rhythm: Normal rate and regular rhythm.     Pulses: Normal pulses.     Heart sounds: Normal heart sounds. No murmur heard.    No friction rub. No gallop.  Pulmonary:     Effort: Pulmonary effort is normal. No respiratory distress.     Breath sounds: Normal  breath sounds. No stridor. No wheezing, rhonchi or rales.  Chest:     Chest wall: No tenderness.  Musculoskeletal:        General: Normal range of motion.     Cervical back: Normal range of motion and neck supple.  Skin:    General: Skin is warm and dry.     Capillary Refill: Capillary refill takes less than 2 seconds.     Coloration: Skin is not jaundiced or pale.     Findings: No bruising, erythema, lesion or rash.  Neurological:     General: No focal deficit present.     Mental Status: He is alert and oriented to person, place, and time. Mental status is at baseline.  Psychiatric:        Mood and Affect: Mood normal.        Behavior: Behavior  normal.        Thought Content: Thought content normal.        Judgment: Judgment normal.     Results for orders placed or performed during the hospital encounter of 12/13/21  TSH  Result Value Ref Range   TSH 0.579 0.350 - 4.500 uIU/mL  T4, free  Result Value Ref Range   Free T4 0.81 0.61 - 1.12 ng/dL  T3, free  Result Value Ref Range   T3, Free 2.9 2.0 - 4.4 pg/mL  Thyroid peroxidase antibody  Result Value Ref Range   Thyroperoxidase Ab SerPl-aCnc 19 0 - 34 IU/mL  Thyroglobulin antibody  Result Value Ref Range   Thyroglobulin Antibody <1.0 0.0 - 0.9 IU/mL      Assessment & Plan:   Problem List Items Addressed This Visit       Endocrine   Type 2 diabetes mellitus with hyperglycemia (HCC) - Primary    Following with endocrinology now. Last A1c up to 10.0. Will defer care to them- following up in 1 month.         Other   Hyperlipidemia    Rechecking labs today. Await results. Treat as needed. Having some myalgias on pravastatin, but generally doing OK.      Relevant Orders   Comprehensive metabolic panel   Lipid Panel w/o Chol/HDL Ratio   Other Visit Diagnoses     Need for shingles vaccine       Relevant Orders   Zoster Recombinant (Shingrix ) (Completed)   Need for influenza vaccination       Relevant Orders   Flu Vaccine QUAD 11moIM (Fluarix, Fluzone & Alfiuria Quad PF) (Completed)        Follow up plan: Return in about 3 months (around 03/18/2022).

## 2021-12-17 NOTE — Assessment & Plan Note (Signed)
Rechecking labs today. Await results. Treat as needed. Having some myalgias on pravastatin, but generally doing OK.

## 2021-12-17 NOTE — Assessment & Plan Note (Signed)
Following with endocrinology now. Last A1c up to 10.0. Will defer care to them- following up in 1 month.

## 2021-12-18 ENCOUNTER — Other Ambulatory Visit: Payer: Self-pay | Admitting: Family Medicine

## 2021-12-18 LAB — COMPREHENSIVE METABOLIC PANEL
ALT: 22 IU/L (ref 0–44)
AST: 20 IU/L (ref 0–40)
Albumin/Globulin Ratio: 1.9 (ref 1.2–2.2)
Albumin: 4.6 g/dL (ref 3.8–4.9)
Alkaline Phosphatase: 67 IU/L (ref 44–121)
BUN/Creatinine Ratio: 13 (ref 9–20)
BUN: 10 mg/dL (ref 6–24)
Bilirubin Total: 0.2 mg/dL (ref 0.0–1.2)
CO2: 19 mmol/L — ABNORMAL LOW (ref 20–29)
Calcium: 9.1 mg/dL (ref 8.7–10.2)
Chloride: 95 mmol/L — ABNORMAL LOW (ref 96–106)
Creatinine, Ser: 0.75 mg/dL — ABNORMAL LOW (ref 0.76–1.27)
Globulin, Total: 2.4 g/dL (ref 1.5–4.5)
Glucose: 237 mg/dL — ABNORMAL HIGH (ref 70–99)
Potassium: 4 mmol/L (ref 3.5–5.2)
Sodium: 131 mmol/L — ABNORMAL LOW (ref 134–144)
Total Protein: 7 g/dL (ref 6.0–8.5)
eGFR: 108 mL/min/{1.73_m2} (ref >59)

## 2021-12-18 LAB — LIPID PANEL W/O CHOL/HDL RATIO
Cholesterol, Total: 291 mg/dL — ABNORMAL HIGH (ref 100–199)
HDL: 20 mg/dL — ABNORMAL LOW (ref >39)
Triglycerides: 2102 mg/dL (ref 0–149)

## 2021-12-18 MED ORDER — REPATHA 140 MG/ML ~~LOC~~ SOSY
140.0000 mg | PREFILLED_SYRINGE | SUBCUTANEOUS | 5 refills | Status: DC
  Filled 2021-12-18: qty 2, 28d supply, fill #0
  Filled 2022-03-18: qty 2, 28d supply, fill #1

## 2021-12-19 ENCOUNTER — Other Ambulatory Visit: Payer: Self-pay

## 2021-12-20 ENCOUNTER — Other Ambulatory Visit: Payer: Self-pay

## 2021-12-24 ENCOUNTER — Other Ambulatory Visit: Payer: Self-pay

## 2021-12-26 ENCOUNTER — Other Ambulatory Visit: Payer: Self-pay | Admitting: Family Medicine

## 2021-12-26 ENCOUNTER — Other Ambulatory Visit: Payer: Self-pay

## 2021-12-27 ENCOUNTER — Other Ambulatory Visit: Payer: Self-pay

## 2021-12-28 ENCOUNTER — Other Ambulatory Visit: Payer: Self-pay

## 2021-12-28 MED FILL — Lisinopril Tab 5 MG: ORAL | 90 days supply | Qty: 90 | Fill #0 | Status: AC

## 2021-12-30 ENCOUNTER — Other Ambulatory Visit: Payer: Self-pay | Admitting: Family Medicine

## 2021-12-30 ENCOUNTER — Other Ambulatory Visit: Payer: Self-pay

## 2021-12-31 ENCOUNTER — Other Ambulatory Visit: Payer: Self-pay

## 2021-12-31 NOTE — Telephone Encounter (Signed)
Rx was dose changed on 12/13/21 #180/3  Requested Prescriptions  Refused Prescriptions Disp Refills   metFORMIN (GLUCOPHAGE-XR) 500 MG 24 hr tablet 360 tablet 1    Sig: TAKE 2 TABLETS (1,000 MG TOTAL) BY MOUTH 2 (TWO) TIMES DAILY.     Endocrinology:  Diabetes - Biguanides Failed - 12/30/2021  2:54 PM      Failed - Cr in normal range and within 360 days    Creatinine  Date Value Ref Range Status  02/11/2013 0.82 0.60 - 1.30 mg/dL Final   Creatinine, Ser  Date Value Ref Range Status  12/17/2021 0.75 (L) 0.76 - 1.27 mg/dL Final         Failed - HBA1C is between 0 and 7.9 and within 180 days    Hemoglobin A1C  Date Value Ref Range Status  12/13/2021 10.0 (A) 4.0 - 5.6 % Final  09/22/2015 8.1  Final   HB A1C (BAYER DCA - WAIVED)  Date Value Ref Range Status  09/02/2021 8.9 (H) 4.8 - 5.6 % Final    Comment:             Prediabetes: 5.7 - 6.4          Diabetes: >6.4          Glycemic control for adults with diabetes: <7.0          Failed - B12 Level in normal range and within 720 days    No results found for: "VITAMINB12"       Passed - eGFR in normal range and within 360 days    EGFR (African American)  Date Value Ref Range Status  02/11/2013 >60  Final   GFR calc Af Amer  Date Value Ref Range Status  12/16/2019 114 >59 mL/min/1.73 Final    Comment:    **In accordance with recommendations from the NKF-ASN Task force,**   [facility] is in the process of updating its eGFR calculation to the   2021 CKD-EPI creatinine equation that estimates kidney function   without a race variable.    EGFR (Non-African Amer.)  Date Value Ref Range Status  02/11/2013 >60  Final    Comment:    eGFR values <51m/min/1.73 m2 may be an indication of chronic kidney disease (CKD). Calculated eGFR is useful in patients with stable renal function. The eGFR calculation will not be reliable in acutely ill patients when serum creatinine is changing rapidly. It is not useful in  patients on  dialysis. The eGFR calculation may not be applicable to patients at the low and high extremes of body sizes, pregnant women, and vegetarians. ALTI - Serum/plasma was lipemic. Test was performed  - on ultrafuged specimen.  - TPL POTASSIUM/AST - Slight hemolysis, interpret results with  - caution...tpl    GFR, Estimated  Date Value Ref Range Status  11/11/2021 >60 >60 mL/min Final    Comment:    (NOTE) Calculated using the CKD-EPI Creatinine Equation (2021) [facility] [address] [place] [id number]   eGFR  Date Value Ref Range Status  12/17/2021 108 >59 mL/min/1.73 Final         Passed - Valid encounter within last 6 months    Recent Outpatient Visits           2 weeks ago Type 2 diabetes mellitus with hyperglycemia, without long-term current use of insulin [facility]   [facility]   4 months ago Routine general medical examination at a  health care facility   [facility] [place] P, DO   7 months ago Type 2 diabetes mellitus with hyperglycemia, without long-term current use of insulin ([place])   [facility], Tony P, DO   11 months ago Type 2 diabetes mellitus with hyperglycemia, without long-term current use of insulin ([place])   [place], Tony P, DO   1 year ago Routine general medical examination at a health care facility   [facility], Barb Merino, DO       Future Appointments             In 2 months Johnson, Tony P, DO [place]            Passed - CBC within normal limits and completed in the last 12 months    WBC  Date Value Ref Range Status  11/11/2021 7.8 4.0 - 10.5 K/uL Final   RBC  Date Value Ref Range Status  11/11/2021 4.56 4.22 - 5.81 MIL/uL Final   Hemoglobin  Date Value Ref Range Status  11/11/2021 15.0 13.0 - 17.0 g/dL Final  09/02/2021 15.8 13.0 - 17.7 g/dL Final   HCT   Date Value Ref Range Status  11/11/2021 41.2 39.0 - 52.0 % Final   Hematocrit  Date Value Ref Range Status  09/02/2021 45.9 37.5 - 51.0 % Final   MCHC  Date Value Ref Range Status  11/11/2021 36.4 (H) 30.0 - 36.0 g/dL Final   [facility]  Date Value Ref Range Status  11/11/2021 32.9 26.0 - 34.0 pg Final   MCV  Date Value Ref Range Status  11/11/2021 90.4 80.0 - 100.0 fL Final  09/02/2021 95 79 - 97 fL Final  02/11/2013 91 80 - 100 fL Final   No results found for: "PLTCOUNTKUC", "LABPLAT", "POCPLA" RDW  Date Value Ref Range Status  11/11/2021 13.1 11.5 - 15.5 % Final  09/02/2021 13.5 11.6 - 15.4 % Final  02/11/2013 13.6 11.5 - 14.5 % Final

## 2022-01-04 ENCOUNTER — Encounter: Payer: Self-pay | Admitting: Nurse Practitioner

## 2022-01-04 ENCOUNTER — Other Ambulatory Visit: Payer: Self-pay

## 2022-01-04 ENCOUNTER — Other Ambulatory Visit: Payer: Self-pay | Admitting: Family Medicine

## 2022-01-05 ENCOUNTER — Other Ambulatory Visit: Payer: Self-pay

## 2022-01-05 MED ORDER — METFORMIN HCL ER 500 MG PO TB24
1000.0000 mg | ORAL_TABLET | Freq: Every day | ORAL | 3 refills | Status: DC
  Filled 2022-01-05: qty 180, 90d supply, fill #0
  Filled 2022-05-08: qty 180, 90d supply, fill #1
  Filled 2022-07-18: qty 180, 90d supply, fill #2

## 2022-01-06 ENCOUNTER — Other Ambulatory Visit: Payer: Self-pay

## 2022-01-06 NOTE — Telephone Encounter (Signed)
Refilled 01/05/22.

## 2022-01-07 ENCOUNTER — Other Ambulatory Visit: Payer: Self-pay

## 2022-01-07 MED ORDER — METHOTREXATE SODIUM 2.5 MG PO TABS
ORAL_TABLET | ORAL | 3 refills | Status: DC
  Filled 2022-01-07: qty 20, 7d supply, fill #0
  Filled 2022-02-06: qty 20, 7d supply, fill #1
  Filled 2022-02-27: qty 20, 7d supply, fill #2
  Filled 2022-03-27: qty 20, 7d supply, fill #3

## 2022-01-11 ENCOUNTER — Other Ambulatory Visit: Payer: Self-pay

## 2022-01-11 MED ORDER — METHOTREXATE SODIUM 2.5 MG PO TABS
ORAL_TABLET | ORAL | 5 refills | Status: DC
  Filled 2022-01-11 – 2022-04-26 (×2): qty 20, 28d supply, fill #0
  Filled 2022-05-23: qty 20, 28d supply, fill #1
  Filled 2022-06-19 – 2022-06-20 (×2): qty 20, 28d supply, fill #2

## 2022-01-13 ENCOUNTER — Ambulatory Visit: Payer: Self-pay | Admitting: Nurse Practitioner

## 2022-01-14 ENCOUNTER — Other Ambulatory Visit: Payer: Self-pay

## 2022-01-21 ENCOUNTER — Other Ambulatory Visit: Payer: Self-pay

## 2022-01-24 ENCOUNTER — Other Ambulatory Visit: Payer: Self-pay

## 2022-01-27 ENCOUNTER — Encounter: Payer: Self-pay | Admitting: Student in an Organized Health Care Education/Training Program

## 2022-01-27 ENCOUNTER — Ambulatory Visit
Payer: 59 | Attending: Student in an Organized Health Care Education/Training Program | Admitting: Student in an Organized Health Care Education/Training Program

## 2022-01-27 ENCOUNTER — Other Ambulatory Visit: Payer: Self-pay

## 2022-01-27 VITALS — BP 125/80 | HR 81 | Temp 97.9°F | Resp 17 | Ht 71.0 in | Wt 235.0 lb

## 2022-01-27 DIAGNOSIS — Q761 Klippel-Feil syndrome: Secondary | ICD-10-CM | POA: Diagnosis not present

## 2022-01-27 DIAGNOSIS — M5412 Radiculopathy, cervical region: Secondary | ICD-10-CM | POA: Diagnosis not present

## 2022-01-27 DIAGNOSIS — M542 Cervicalgia: Secondary | ICD-10-CM | POA: Diagnosis not present

## 2022-01-27 DIAGNOSIS — G894 Chronic pain syndrome: Secondary | ICD-10-CM | POA: Insufficient documentation

## 2022-01-27 DIAGNOSIS — Z981 Arthrodesis status: Secondary | ICD-10-CM | POA: Diagnosis not present

## 2022-01-27 MED ORDER — METHOCARBAMOL 500 MG PO TABS
500.0000 mg | ORAL_TABLET | Freq: Three times a day (TID) | ORAL | 5 refills | Status: DC | PRN
  Filled 2022-01-27: qty 90, 30d supply, fill #0

## 2022-01-27 MED ORDER — HYDROCODONE-ACETAMINOPHEN 7.5-325 MG PO TABS: 1.0000 | ORAL_TABLET | Freq: Four times a day (QID) | ORAL | 0 refills | Status: DC | PRN

## 2022-01-27 MED ORDER — HYDROCODONE-ACETAMINOPHEN 7.5-325 MG PO TABS
1.0000 | ORAL_TABLET | Freq: Four times a day (QID) | ORAL | 0 refills | Status: DC | PRN
  Filled 2022-02-23 (×2): qty 120, 30d supply, fill #0

## 2022-01-27 NOTE — Patient Instructions (Signed)

## 2022-01-27 NOTE — Progress Notes (Signed)
Nursing Pain Medication Assessment:  Safety precautions to be maintained throughout the outpatient stay will include: orient to surroundings, keep bed in low position, maintain call bell within reach at all times, provide assistance with transfer out of bed and ambulation.  Medication Inspection Compliance: Pill count conducted under aseptic conditions, in front of the patient. Neither the pills nor the bottle was removed from the patient's sight at any time. Once count was completed pills were immediately returned to the patient in their original bottle.  Medication: Hydrocodone/APAP Pill/Patch Count:  107 of 120 pills remain Pill/Patch Appearance: Markings consistent with prescribed medication Bottle Appearance: 2024 Filled Date: 01 / 15 / 2024 Last Medication intake:  Today

## 2022-01-27 NOTE — Progress Notes (Signed)
PROVIDER NOTE: Information contained herein reflects review and annotations entered in association with encounter. Interpretation of such information and data should be left to medically-trained personnel. Information provided to patient can be located elsewhere in the medical record under "Patient Instructions". Document created using STT-dictation technology, any transcriptional errors that may result from process are unintentional.    Patient: Tony Cardenas  Service Category: E/M  Provider: Gillis Santa, MD  DOB: March 21, 1970  DOS: 08/18/2020  Specialty: Interventional Pain Management  MRN: [id number]  Setting: Ambulatory outpatient  PCP: Valerie Roys, DO  Type: Established Patient    Referring Provider: Valerie Roys, DO  Location: Office  Delivery: Face-to-face     HPI  Mr. BILLYJACK TROMPETER, a 52 y.o. 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 Mr. Labell primary complain today is Neck Pain  Last encounter: My last encounter with him was on 11/02/21 Pertinent problems: Mr. Splawn has S/P cervical spinal fusion; Cervical radicular pain; Cervical fusion syndrome; Chronic pain syndrome; and Cervical spondylosis with myelopathy and radiculopathy on their pertinent problem list. Pain Assessment: Severity of Chronic pain is reported as a 8 /10. Location: Neck Right, Left/to shoulders and down right arm to fingers. Onset: More than a month ago. Quality: Hervey Ard, Shooting, Dull, Nagging, Constant. Timing: Constant. Modifying factor(s): trigger point injections, meds. Vitals:  height is '5\' 11"'$  (1.803 m) and weight is 235 lb (106.6 kg). His temporal temperature is 97.9 F (36.6 C). His blood pressure is 125/80 and his pulse is 81. His respiration is 17 and oxygen saturation is 97%.   Reason for encounter: medication management.    Patient presents today for medication management.  Overall he is doing well other than increased cervical/trapezius  myofascial pain for which he is requesting cervical trigger point injections for.  This was previously done June 14, 2021 Otherwise we will refill his hydrocodone.    Pharmacotherapy Assessment  Analgesic: Hydrocodone 7.5 mg QID PRN   Monitoring:  PMP: PDMP reviewed during this encounter.       Pharmacotherapy: No side-effects or adverse reactions reported. Compliance: No problems identified. Effectiveness: Clinically acceptable.  UDS:  Summary  Date Value Ref Range Status  02/16/2021 Note  Final    Comment:    ==================================================================== [employer] Select 13 (MW) ==================================================================== Test                             Result       Flag       Units  Drug Present and Declared for Prescription Verification   Hydrocodone                    540          EXPECTED   ng/mg creat   Hydromorphone                  604          EXPECTED   ng/mg creat   Dihydrocodeine                 192          EXPECTED   ng/mg creat   Norhydrocodone                 322          EXPECTED   ng/mg creat    Sources of hydrocodone include scheduled prescription  medications.    Hydromorphone, dihydrocodeine and norhydrocodone are expected    metabolites of hydrocodone. Hydromorphone and dihydrocodeine are    also available as scheduled prescription medications.  ==================================================================== Test                      Result    Flag   Units      Ref Range   Creatinine              77               mg/dL      >=20 ==================================================================== Declared Medications:  The flagging and interpretation on this report are based on the  following declared medications.  Unexpected results may arise from  inaccuracies in the declared medications.   **Note: The testing scope of this panel includes these medications:   Hydrocodone (Norco)   **Note: The  testing scope of this panel does not include the  following reported medications:   Acetaminophen (Norco)  Albuterol (Ventolin HFA)  Empagliflozin (Jardiance)  Ezetimibe (Zetia)  Fluticasone (Flonase)  Folic Acid  Lisinopril (Zestril)  Metformin  Methocarbamol (Robaxin)  Methotrexate  Omega-3 Fatty Acids  Pregabalin (Lyrica)  Vitamin D2 (Drisdol) ==================================================================== For clinical consultation, please call [phone]. ====================================================================       Renew annual urine toxicology screen for medication compliance monitoring  ROS  Constitutional: Denies any fever or chills Gastrointestinal: No reported hemesis, hematochezia, vomiting, or acute GI distress Musculoskeletal:  mild cervical and trapezius pain Neurological: No reported episodes of acute onset apraxia, aphasia, dysarthria, agnosia, amnesia, paralysis, loss of coordination, or loss of consciousness  Medication Review  Evolocumab, FreeStyle Freedom Lite, HYDROcodone-acetaminophen, Vitamin D (Ergocalciferol), albuterol, ezetimibe, fluticasone, folic acid, freestyle, glucose blood, lisinopril, metFORMIN, methocarbamol, methotrexate, omega-3 acid ethyl esters, and pravastatin  History Review  Allergy: Mr. Demartin is allergic to atorvastatin and hydromorphone hcl. Drug: Mr. Halsted  reports no history of drug use. Alcohol:  reports no history of alcohol use. Tobacco:  reports that he quit smoking about 2 years ago. His smoking use included cigarettes. He has a 10.00 pack-year smoking history. He quit smokeless tobacco use about 5 years ago.  His smokeless tobacco use included chew. Social: Mr. Gonzaga  reports that he quit smoking about 2 years ago. His smoking use included cigarettes. He has a 10.00 pack-year smoking history. He quit smokeless tobacco use about 5 years ago.  His smokeless tobacco use included chew. He reports  that he does not drink alcohol and does not use drugs. Medical:  has a past medical history of Arthritis, COPD (chronic obstructive pulmonary disease) ([place]), Diabetes mellitus without complication ([place]), GERD (gastroesophageal reflux disease), Hyperlipidemia, Hypertension, and Neck pain (1989). Surgical: Mr. Brodowski  has a past surgical history that includes Neck surgery (1989); Knee surgery (Right); Colonoscopy (N/A, 06/02/2014); Polypectomy (06/02/2014); Appendectomy; Esophagogastroduodenoscopy (egd) with propofol (N/A, 07/29/2016); Back surgery; Anterior cervical corpectomy (N/A, 10/10/2018); Hernia repair (over 10 years ago); Anterior cervical corpectomy (N/A, 05/01/2019); Posterior cervical fusion/foraminotomy (N/A, 05/01/2019); and Colonoscopy with propofol (N/A, 11/15/2021). Family: family history includes Alcohol abuse in his father; COPD in his father; Cancer in his mother; Diabetes in his brother, father, and paternal grandmother.  Laboratory Chemistry Profile   Renal Lab Results  Component Value Date   BUN 10 12/17/2021   CREATININE 0.75 (L) 12/17/2021   BCR 13 12/17/2021   GFRAA 114 12/16/2019   GFRNONAA >60 11/11/2021     Hepatic Lab Results  Component Value  Date   AST 20 12/17/2021   ALT 22 12/17/2021   ALBUMIN 4.6 12/17/2021   ALKPHOS 67 12/17/2021   HCVAB NON REACTIVE 03/24/2020     Electrolytes Lab Results  Component Value Date   NA 131 (L) 12/17/2021   K 4.0 12/17/2021   CL 95 (L) 12/17/2021   CALCIUM 9.1 12/17/2021     Bone Lab Results  Component Value Date   VD25OH 23.1 (L) 09/02/2021     Inflammation (CRP: Acute Phase) (ESR: Chronic Phase) Lab Results  Component Value Date   CRP 0.7 05/26/2020   ESRSEDRATE 31 (H) 11/11/2021       Note: Above Lab results reviewed.  Recent Imaging Review  US THYROID CLINICAL DATA:  Goiter.  Neck compressive symptoms.  EXAM: THYROID ULTRASOUND  TECHNIQUE: Ultrasound examination of the thyroid gland and adjacent  soft tissues was performed.  COMPARISON:  06/16/2017  FINDINGS: Parenchymal Echotexture: Mildly heterogenous  Isthmus: 0.7 cm  Right lobe: 6.7 x 2.9 x 3.4 cm  Left lobe: 6.7 x 2.9 x 2.6 cm  _________________________________________________________  Estimated total number of nodules >/= 1 cm: 0  Number of spongiform nodules >/=  2 cm not described below (TR1): 0  Number of mixed cystic and solid nodules >/= 1.5 cm not described below (TR2): 0  _________________________________________________________  No discrete nodules are seen within the thyroid gland. No enlarged or abnormal appearing lymph nodes are identified.  IMPRESSION: Enlarged and mildly heterogeneous thyroid gland without discrete nodules. Overall volume of the thyroid gland is similar to the 2019 study.  The above is in keeping with the ACR TI-RADS recommendations - J Am Coll Radiol 2017;14:587-595.  Electronically Signed   By: Aletta Edouard M.D.   On: 12/13/2021 15:02  Note: Reviewed        Physical Exam  General appearance: Well nourished, well developed, and well hydrated. In no apparent acute distress Mental status: Alert, oriented x 3 (person, place, & time)       Respiratory: No evidence of acute respiratory distress Eyes: PERLA Vitals: BP 125/80   Pulse 81   Temp 97.9 F (36.6 C) (Temporal)   Resp 17   Ht '5\' 11"'$  (1.803 m)   Wt 235 lb (106.6 kg)   SpO2 97%   BMI 32.78 kg/m  BMI: Estimated body mass index is 32.78 kg/m as calculated from the following:   Height as of this encounter: '5\' 11"'$  (1.803 m).   Weight as of this encounter: 235 lb (106.6 kg). Ideal: Ideal body weight: 75.3 kg (166 lb 0.1 oz) Adjusted ideal body weight: 87.8 kg (193 lb 9.7 oz)  Limited range of motion with cervical extension.  Tenderness to palpation along trapezius and deltoid. Neuropathic pain pattern of upper extremity, persistent Neuropathic pain of bilateral lower extremity   Assessment   Status  Diagnosis  Controlled Controlled Controlled 1. Cervical fusion syndrome   2. Cervical radicular pain   3. Cervicalgia   4. S/P cervical spinal fusion   5. Chronic pain syndrome         Plan of Care   Mr. RONELL DUFFUS has a current medication list which includes the following long-term medication(s): albuterol, ezetimibe, fluticasone, lisinopril, metformin, omega-3 acid ethyl esters, and pravastatin.  Pharmacotherapy (Medications Ordered): Meds ordered this encounter  Medications   HYDROcodone-acetaminophen (NORCO) 7.5-325 MG tablet    Sig: Take 1 tablet by mouth every 6 (six) hours as needed for moderate pain.    Dispense:  120 tablet  Refill:  0   HYDROcodone-acetaminophen (NORCO) 7.5-325 MG tablet    Sig: Take 1 tablet by mouth every 6 (six) hours as needed for moderate pain.    Dispense:  120 tablet    Refill:  0   HYDROcodone-acetaminophen (NORCO) 7.5-325 MG tablet    Sig: Take 1 tablet by mouth every 6 (six) hours as needed for moderate pain.    Dispense:  120 tablet    Refill:  0   methocarbamol (ROBAXIN) 500 MG tablet    Sig: Take 1 tablet (500 mg total) by mouth every 8 (eight) hours as needed for muscle spasms.    Dispense:  90 tablet    Refill:  5    Do not place this medication, or any other prescription from our practice, on "Automatic Refill". Patient may have prescription filled one day early if pharmacy is closed on scheduled refill date.    Orders Placed This Encounter  Procedures   TRIGGER POINT INJECTION    Standing Status:   Future    Standing Expiration Date:   04/28/2022    Scheduling Instructions:     Cervical TPI    Order Specific Question:   Where will this procedure be performed?    Answer:   [facility]   Tox[employer] Select 13 (MW), Urine    Volume: 30 ml(s). Minimum 3 ml of urine is needed. Document temperature of fresh sample. Indications: Long term (current) use of opiate analgesic (V69.794)    Order Specific Question:    Release to patient    Answer:   Immediate   Discussed Qutenza, capsaicin 8% treatment for painful diabetic neuropathy of lower extremity.  Follow-up plan:   Return in about 16 weeks (around 05/19/2022) for Medication Management, in person.   Recent Visits Date Type Provider Dept  11/02/21 Office Visit Gillis Santa, MD [facility]  Showing recent visits within past 90 days and meeting all other requirements Today's Visits Date Type Provider Dept  01/27/22 Office Visit Gillis Santa, MD [facility]  Showing today's visits and meeting all other requirements Future Appointments No visits were found meeting these conditions. Showing future appointments within next 90 days and meeting all other requirements  I discussed the assessment and treatment plan with the patient. The patient was provided an opportunity to ask questions and all were answered. The patient agreed with the plan and demonstrated an understanding of the instructions.  Patient advised to call back or seek an in-person evaluation if the symptoms or condition worsens.  Duration of encounter: 60mnutes.  Note by: BGillis Santa MD Date: 01/27/2022; Time: 10:08 AM

## 2022-01-30 LAB — TOXASSURE SELECT 13 (MW), URINE

## 2022-02-06 ENCOUNTER — Other Ambulatory Visit: Payer: Self-pay | Admitting: Family Medicine

## 2022-02-07 ENCOUNTER — Other Ambulatory Visit: Payer: Self-pay

## 2022-02-07 MED ORDER — EZETIMIBE 10 MG PO TABS
10.0000 mg | ORAL_TABLET | Freq: Every day | ORAL | 0 refills | Status: DC
  Filled 2022-02-07: qty 90, 90d supply, fill #0

## 2022-02-07 NOTE — Telephone Encounter (Signed)
Requested Prescriptions  Pending Prescriptions Disp Refills   ezetimibe (ZETIA) 10 MG tablet 90 tablet 0    Sig: TAKE 1 TABLET BY MOUTH DAILY.     Cardiovascular:  Antilipid - Sterol Transport Inhibitors Failed - 02/06/2022  6:49 PM      Failed - Lipid Panel in normal range within the last 12 months    Cholesterol, Total  Date Value Ref Range Status  12/17/2021 291 (H) 100 - 199 mg/dL Final   Cholesterol [place], [place]  Date Value Ref Range Status  06/22/2015 WILL FOLLOW  Preliminary   LDL Chol Calc (NIH)  Date Value Ref Range Status  12/17/2021 Comment (A) 0 - 99 mg/dL Final    Comment:    Triglyceride result indicated is too high for an accurate LDL cholesterol estimation.    HDL  Date Value Ref Range Status  12/17/2021 20 (L) >39 mg/dL Final   Triglycerides  Date Value Ref Range Status  12/17/2021 2,102 (HH) 0 - 149 mg/dL Final    Comment:    Results confirmed on dilution.    Triglycerides [place],Waived  Date Value Ref Range Status  06/22/2015 WILL FOLLOW  Preliminary         Passed - AST in normal range and within 360 days    AST  Date Value Ref Range Status  12/17/2021 20 0 - 40 IU/L Final   SGOT(AST)  Date Value Ref Range Status  02/11/2013 46 (H) 15 - 37 Unit/L Final         Passed - ALT in normal range and within 360 days    ALT  Date Value Ref Range Status  12/17/2021 22 0 - 44 IU/L Final   SGPT (ALT)  Date Value Ref Range Status  02/11/2013 51 12 - 78 U/L Final         Passed - Patient is not pregnant      Passed - Valid encounter within last 12 months    Recent Outpatient Visits           1 month ago Type 2 diabetes mellitus with hyperglycemia, without long-term current use of insulin ([place])   [place], Megan P, DO   5 months ago Routine general medical examination at a health care facility   [facility] [place] P, DO   9 months ago Type 2 diabetes mellitus with  hyperglycemia, without long-term current use of insulin ([place])   [place], Megan P, DO   1 year ago Type 2 diabetes mellitus with hyperglycemia, without long-term current use of insulin ([place])   [place], Megan P, DO   1 year ago Routine general medical examination at a health care facility   [facility] Valerie Roys, DO       Future Appointments             In 1 month Wynetta Emery, Barb Merino, DO [place]

## 2022-02-15 ENCOUNTER — Ambulatory Visit: Payer: Self-pay | Admitting: Nurse Practitioner

## 2022-02-16 ENCOUNTER — Ambulatory Visit
Payer: No Typology Code available for payment source | Attending: Student in an Organized Health Care Education/Training Program | Admitting: Student in an Organized Health Care Education/Training Program

## 2022-02-16 ENCOUNTER — Encounter: Payer: Self-pay | Admitting: Student in an Organized Health Care Education/Training Program

## 2022-02-16 VITALS — BP 140/76 | HR 87 | Temp 98.2°F | Resp 20 | Ht 71.0 in | Wt 242.0 lb

## 2022-02-16 DIAGNOSIS — M5412 Radiculopathy, cervical region: Secondary | ICD-10-CM

## 2022-02-16 DIAGNOSIS — M542 Cervicalgia: Secondary | ICD-10-CM | POA: Diagnosis not present

## 2022-02-16 DIAGNOSIS — Q761 Klippel-Feil syndrome: Secondary | ICD-10-CM | POA: Diagnosis not present

## 2022-02-16 MED ORDER — ROPIVACAINE HCL 2 MG/ML IJ SOLN
9.0000 mL | Freq: Once | INTRAMUSCULAR | Status: AC
  Administered 2022-02-16: 20 mL via PERINEURAL

## 2022-02-16 MED ORDER — ROPIVACAINE HCL 2 MG/ML IJ SOLN
INTRAMUSCULAR | Status: AC
  Filled 2022-02-16: qty 20

## 2022-02-16 NOTE — Progress Notes (Signed)
PROVIDER NOTE: Interpretation of information contained herein should be left to medically-trained personnel. Specific patient instructions are provided elsewhere under "Patient Instructions" section of medical record. This document was created in part using STT-dictation technology, any transcriptional errors that may result from this process are unintentional.  Patient: Tony Cardenas Type: Established DOB: 10-06-1970 MRN: [id number] PCP: Valerie Roys, DO  Service: Procedure DOS: 02/16/2022 Setting: Ambulatory Location: Ambulatory outpatient facility Delivery: Face-to-face Provider: Gillis Santa, MD Specialty: Interventional Pain Management Specialty designation: 09 Location: Outpatient facility Ref. Prov.: Valerie Roys, DO    Primary Reason for Visit: Interventional Pain Management Treatment. CC: Neck Pain    Procedure:           Type: Trigger Point Injection (3+ muscle groups)          CPT: 20553 Primary Purpose: Therapeutic Cervical, Trapezius TPI Target Area: Trigger Point Approach: Percutaneous, ipsilateral approach. Laterality: Midline          1. Cervical fusion syndrome   2. Cervical radicular pain   3. Cervicalgia    NAS-11 Pain score:   Pre-procedure: 9 /10   Post-procedure: 8  (putting on shirt)/10     Pre-op H&P Assessment:  Tony Cardenas is a 52 y.o. (year old), male patient, seen today for interventional treatment. He  has a past surgical history that includes Neck surgery (1989); Knee surgery (Right); Colonoscopy (N/A, 06/02/2014); Polypectomy (06/02/2014); Appendectomy; Esophagogastroduodenoscopy (egd) with propofol (N/A, 07/29/2016); Back surgery; Anterior cervical corpectomy (N/A, 10/10/2018); Hernia repair (over 10 years ago); Anterior cervical corpectomy (N/A, 05/01/2019); Posterior cervical fusion/foraminotomy (N/A, 05/01/2019); and Colonoscopy with propofol (N/A, 11/15/2021). Tony Cardenas has a current medication list which includes the following  prescription(s): albuterol, blood glucose meter kit and supplies, repatha, ezetimibe, fluticasone, folic acid, glucose blood, [START ON 02/23/2022] hydrocodone-acetaminophen, [START ON 03/25/2022] hydrocodone-acetaminophen, [START ON 04/24/2022] hydrocodone-acetaminophen, freestyle, lisinopril, metformin, methocarbamol, methotrexate, methotrexate, omega-3 acid ethyl esters, pravastatin, and vitamin d (ergocalciferol). His primarily concern today is the Neck Pain  Initial Vital Signs:  Pulse/HCG Rate: 87  Temp: 98.2 F (36.8 C) Resp: 20 BP: (!) 140/76 SpO2: 98 %  BMI: Estimated body mass index is 33.75 kg/m as calculated from the following:   Height as of this encounter: '5\' 11"'$  (1.803 m).   Weight as of this encounter: 242 lb (109.8 kg).  Risk Assessment: Allergies: Reviewed. He is allergic to atorvastatin and hydromorphone hcl.  Allergy Precautions: None required Coagulopathies: Reviewed. None identified.  Blood-thinner therapy: None at this time Active Infection(s): Reviewed. None identified. Tony Cardenas is afebrile  Site Confirmation: Tony Cardenas was asked to confirm the procedure and laterality before marking the site Procedure checklist: Completed Consent: Before the procedure and under the influence of no sedative(s), amnesic(s), or anxiolytics, the patient was informed of the treatment options, risks and possible complications. To fulfill our ethical and legal obligations, as recommended by the American Medical Association's Code of Ethics, I have informed the patient of my clinical impression; the nature and purpose of the treatment or procedure; the risks, benefits, and possible complications of the intervention; the alternatives, including doing nothing; the risk(s) and benefit(s) of the alternative treatment(s) or procedure(s); and the risk(s) and benefit(s) of doing nothing. The patient was provided information about the general risks and possible complications associated with the  procedure. These may include, but are not limited to: failure to achieve desired goals, infection, bleeding, organ or nerve damage, allergic reactions, paralysis, and death. In addition, the patient was informed of those risks and  complications associated to the procedure, such as failure to decrease pain; infection; bleeding; organ or nerve damage with subsequent damage to sensory, motor, and/or autonomic systems, resulting in permanent pain, numbness, and/or weakness of one or several areas of the body; allergic reactions; (i.e.: anaphylactic reaction); and/or death. Furthermore, the patient was informed of those risks and complications associated with the medications. These include, but are not limited to: allergic reactions (i.e.: anaphylactic or anaphylactoid reaction(s)); adrenal axis suppression; blood sugar elevation that in diabetics may result in ketoacidosis or comma; water retention that in patients with history of congestive heart failure may result in shortness of breath, pulmonary edema, and decompensation with resultant heart failure; weight gain; swelling or edema; medication-induced neural toxicity; particulate matter embolism and blood vessel occlusion with resultant organ, and/or nervous system infarction; and/or aseptic necrosis of one or more joints. Finally, the patient was informed that Medicine is not an exact science; therefore, there is also the possibility of unforeseen or unpredictable risks and/or possible complications that may result in a catastrophic outcome. The patient indicated having understood very clearly. We have given the patient no guarantees and we have made no promises. Enough time was given to the patient to ask questions, all of which were answered to the patient's satisfaction. Tony Cardenas has indicated that he wanted to continue with the procedure. Attestation: I, the ordering provider, attest that I have discussed with the patient the benefits, risks,  side-effects, alternatives, likelihood of achieving goals, and potential problems during recovery for the procedure that I have provided informed consent. Date  Time: 02/16/2022  8:59 AM  Pre-Procedure Preparation:  Monitoring: As per clinic protocol. Respiration, ETCO2, SpO2, BP, heart rate and rhythm monitor placed and checked for adequate function Safety Precautions: Patient was assessed for positional comfort and pressure points before starting the procedure. Time-out: I initiated and conducted the "Time-out" before starting the procedure, as per protocol. The patient was asked to participate by confirming the accuracy of the "Time Out" information. Verification of the correct person, site, and procedure were performed and confirmed by me, the nursing staff, and the patient. "Time-out" conducted as per Joint Commission's Universal Protocol (UP.01.01.01). Time: 0917  Description of Procedure:          Area Prepped: Entire             Region DuraPrep (Iodine Povacrylex [0.7% available iodine] and Isopropyl Alcohol, 74% w/w) Safety Precautions: Aspiration looking for blood return was conducted prior to all injections. At no point did we inject any substances, as a needle was being advanced. No attempts were made at seeking any paresthesias. Safe injection practices and needle disposal techniques used. Medications properly checked for expiration dates. SDV (single dose vial) medications used. Description of the Procedure: Protocol guidelines were followed. The patient was placed in position over the fluoroscopy table. The target area was identified and the area prepped in the usual manner. Skin & deeper tissues infiltrated with local anesthetic. Appropriate amount of time allowed to pass for local anesthetics to take effect. The procedure needles were then advanced to the target area. Proper needle placement secured. Negative aspiration confirmed. Solution injected in intermittent fashion, asking for  systemic symptoms every 0.5cc of injectate. The needles were then removed and the area cleansed, making sure to leave some of the prepping solution back to take advantage of its long term bactericidal properties.  Vitals:   02/16/22 0902  BP: (!) 140/76  Pulse: 87  Resp: 20  Temp: 98.2 F (36.8  C)  SpO2: 98%  Weight: 242 lb (109.8 kg)  Height: '5\' 11"'$  (1.803 m)     Start Time: 0917 hrs. End Time: 0921 hrs. Materials:     Approximately 15 trigger points injected on each side with dry needling performed.  Each trigger point was injected with 0.5 to 1 cc of 0.2% Ropivacaine with dry needling performed.  Post-operative Assessment:  Post-procedure Vital Signs:  Pulse/HCG Rate: 87  Temp: 98.2 F (36.8 C) Resp: 20 BP: (!) 140/76 SpO2: 98 %  EBL: None  Complications: No immediate post-treatment complications observed by team, or reported by patient.  Note: The patient tolerated the entire procedure well. A repeat set of vitals were taken after the procedure and the patient was kept under observation following institutional policy, for this type of procedure. Post-procedural neurological assessment was performed, showing return to baseline, prior to discharge. The patient was provided with post-procedure discharge instructions, including a section on how to identify potential problems. Should any problems arise concerning this procedure, the patient was given instructions to immediately contact us, at any time, without hesitation. In any case, we plan to contact the patient by telephone for a follow-up status report regarding this interventional procedure.  Comments:  No additional relevant information.  Plan of Care    Chronic Opioid Analgesic:  Hydrocodone 7.5 mg QID PRN   Medications ordered for procedure: Meds ordered this encounter  Medications   ropivacaine (PF) 2 mg/mL (0.2%) (NAROPIN) injection 9 mL   Medications administered: We administered ropivacaine (PF) 2 mg/mL  (0.2%).  See the medical record for exact dosing, route, and time of administration.  Follow-up plan:   No follow-ups on file.      Recent Visits Date Type Provider Dept  01/27/22 Office Visit Gillis Santa, MD [facility]  Showing recent visits within past 90 days and meeting all other requirements Today's Visits Date Type Provider Dept  02/16/22 Procedure visit Gillis Santa, MD [facility]  Showing today's visits and meeting all other requirements Future Appointments Date Type Provider Dept  05/12/22 Appointment Gillis Santa, MD [facility]  Showing future appointments within next 90 days and meeting all other requirements  Disposition: Discharge home  Discharge (Date  Time): 02/16/2022; 0923 hrs.   Primary Care Physician: Valerie Roys, DO Location: [facility] Note by: Gillis Santa, MD Date: 02/16/2022; Time: 9:53 AM  Disclaimer:  Medicine is not an exact science. The only guarantee in medicine is that nothing is guaranteed. It is important to note that the decision to proceed with this intervention was based on the information collected from the patient. The Data and conclusions were drawn from the patient's questionnaire, the interview, and the physical examination. Because the information was provided in large part by the patient, it cannot be guaranteed that it has not been purposely or unconsciously manipulated. Every effort has been made to obtain as much relevant data as possible for this evaluation. It is important to note that the conclusions that lead to this procedure are derived in large part from the available data. Always take into account that the treatment will also be dependent on availability of resources and existing treatment guidelines, considered by other Pain Management Practitioners as being common knowledge and practice, at the time of the intervention. For Medico-Legal purposes, it is also  important to point out that variation in procedural techniques and pharmacological choices are the acceptable norm. The indications, contraindications, technique, and results of the above  procedure should only be interpreted and judged by a Board-Certified Interventional Pain Specialist with extensive familiarity and expertise in the same exact procedure and technique.

## 2022-02-16 NOTE — Progress Notes (Signed)
Safety precautions to be maintained throughout the outpatient stay will include: orient to surroundings, keep bed in low position, maintain call bell within reach at all times, provide assistance with transfer out of bed and ambulation.  

## 2022-02-16 NOTE — Patient Instructions (Signed)

## 2022-02-17 ENCOUNTER — Telehealth: Payer: Self-pay

## 2022-02-17 NOTE — Telephone Encounter (Signed)
Post procedure follow up..  Patient states he is doing good 

## 2022-02-21 ENCOUNTER — Other Ambulatory Visit: Payer: Self-pay

## 2022-02-22 ENCOUNTER — Ambulatory Visit: Payer: Self-pay

## 2022-02-22 ENCOUNTER — Telehealth: Payer: No Typology Code available for payment source | Admitting: Physician Assistant

## 2022-02-22 DIAGNOSIS — U071 COVID-19: Secondary | ICD-10-CM | POA: Diagnosis not present

## 2022-02-22 MED ORDER — NIRMATRELVIR/RITONAVIR (PAXLOVID)TABLET: 3.0000 | ORAL_TABLET | Freq: Two times a day (BID) | ORAL | 0 refills | Status: AC

## 2022-02-22 MED ORDER — BENZONATATE 100 MG PO CAPS: 100.0000 mg | ORAL_CAPSULE | Freq: Three times a day (TID) | ORAL | 0 refills | Status: DC | PRN

## 2022-02-22 NOTE — Patient Instructions (Signed)
Abelino Derrick, thank you for joining [name], PA-C for today's virtual visit.  While this provider is not your primary care provider (PCP), if your PCP is located in our provider database this encounter information will be shared with them immediately following your visit.   [facility] account gives you access to today's visit and all your visits, tests, and labs performed at [facility] " click here if you don't have a [facility] account or go to [email or web address]  Consent: (Patient) Tony Cardenas provided verbal consent for this virtual visit at the beginning of the encounter.  Current Medications:  Current Outpatient Medications:    benzonatate (TESSALON) 100 MG capsule, Take 1 capsule (100 mg total) by mouth 3 (three) times daily as needed for cough., Disp: 20 capsule, Rfl: 0   nirmatrelvir/ritonavir (PAXLOVID) 20 x 150 MG & 10 x 100MG TABS, Take 3 tablets by mouth 2 (two) times daily for 5 days. (Take nirmatrelvir 150 mg two tablets twice daily for 5 days and ritonavir 100 mg one tablet twice daily for 5 days) Patient GFR is >60, Disp: 30 tablet, Rfl: 0   albuterol (VENTOLIN HFA) 108 (90 Base) MCG/ACT inhaler, INHALE 2 PUFFS INTO THE LUNGS EVERY 6 HOURS AS NEEDED FOR WHEEZING OR SHORTNESS OF BREATH., Disp: 6.7 g, Rfl: 3   blood glucose meter kit and supplies, Dispense based on patient and insurance preference. Use up to twice times daily as directed. (FOR ICD-10 E10.9, E11.9)., Disp: 1 each, Rfl: 0   Evolocumab (REPATHA) 140 MG/ML SOSY, Inject 140 mg into the skin every 14 (fourteen) days., Disp: 2 mL, Rfl: 5   ezetimibe (ZETIA) 10 MG tablet, Take 1 tablet (10 mg total) by mouth daily., Disp: 90 tablet, Rfl: 0   fluticasone (FLONASE) 50 MCG/ACT nasal spray, PLACE 2 SPRAYS INTO BOTH NOSTRILS DAILY IN THE EVENING, Disp: 16 g, Rfl: 12   folic acid (FOLVITE) 1 MG tablet, Take 1 tablet (1 mg total) by mouth once daily, Disp: 90 tablet, Rfl:  3   glucose blood test strip, Use up to twice times daily as directed, Disp: 100 each, Rfl: 0   [START ON 02/23/2022] HYDROcodone-acetaminophen (NORCO) 7.5-325 MG tablet, Take 1 tablet by mouth every 6 (six) hours as needed for moderate pain., Disp: 120 tablet, Rfl: 0   [START ON 03/25/2022] HYDROcodone-acetaminophen (NORCO) 7.5-325 MG tablet, Take 1 tablet by mouth every 6 (six) hours as needed for moderate pain., Disp: 120 tablet, Rfl: 0   [START ON 04/24/2022] HYDROcodone-acetaminophen (NORCO) 7.5-325 MG tablet, Take 1 tablet by mouth every 6 (six) hours as needed for moderate pain., Disp: 120 tablet, Rfl: 0   Lancets (FREESTYLE) lancets, Use up to twice times daily as directed, Disp: 100 each, Rfl: 0   lisinopril (ZESTRIL) 5 MG tablet, TAKE 1 TABLET (5 MG TOTAL) BY MOUTH DAILY., Disp: 90 tablet, Rfl: 1   metFORMIN (GLUCOPHAGE-XR) 500 MG 24 hr tablet, Take 2 tablets (1,000 mg total) by mouth daily with breakfast., Disp: 180 tablet, Rfl: 3   methocarbamol (ROBAXIN) 500 MG tablet, Take 1 tablet (500 mg total) by mouth every 8 (eight) hours as needed for muscle spasms., Disp: 90 tablet, Rfl: 5   methotrexate (RHEUMATREX) 2.5 MG tablet, Take 5 tablets (12.5 mg total) by mouth every 7 (seven) days All on the same day, Disp: 20 tablet, Rfl: 3   methotrexate (RHEUMATREX) 2.5 MG tablet, Take 5 tablets (12.5 mg total) by mouth every 7 (seven) days All  on the same day, Disp: 20 tablet, Rfl: 5   omega-3 acid ethyl esters (LOVAZA) 1 g capsule, Take 1 capsule (1 g total) by mouth daily., Disp: 90 capsule, Rfl: 2   pravastatin (PRAVACHOL) 10 MG tablet, Take 1 tablet (10 mg total) by mouth once a week., Disp: 52 tablet, Rfl: 0   Vitamin D, Ergocalciferol, (DRISDOL) 1.25 MG (50000 UNIT) CAPS capsule, TAKE 1 CAPSULE BY MOUTH ONCE A WEEK FOR 12 WEEKS, Disp: 12 capsule, Rfl: 1   Medications ordered in this encounter:  Meds ordered this encounter  Medications   nirmatrelvir/ritonavir (PAXLOVID) 20 x 150 MG & 10 x  100MG TABS    Sig: Take 3 tablets by mouth 2 (two) times daily for 5 days. (Take nirmatrelvir 150 mg two tablets twice daily for 5 days and ritonavir 100 mg one tablet twice daily for 5 days) Patient GFR is >60    Dispense:  30 tablet    Refill:  0    Order Specific Question:   Supervising Provider    Answer:   Chase Picket [id number]   benzonatate (TESSALON) 100 MG capsule    Sig: Take 1 capsule (100 mg total) by mouth 3 (three) times daily as needed for cough.    Dispense:  20 capsule    Refill:  0    Order Specific Question:   Supervising Provider    Answer:   Chase Picket [id number]     *If you need refills on other medications prior to your next appointment, please contact your pharmacy*  Follow-Up: Call back or seek an in-person evaluation if the symptoms worsen or if the condition fails to improve as anticipated.  [place] [phone]  Other Instructions Take Paxlovid as prescribed.  Continue with rescue inhaler as needed.  Recommend Ibuprofen or Tylenol as needed for body aches and fever.  Drink plenty of fluids, rest.  Can use tessalon pearls for cough as needed.    If breathing becomes worse you can keep PCP appointment tomorrow for evaluation.    If you have been instructed to have an in-person evaluation today at a local Urgent Care facility, please use the link below. It will take you to a list of all of our available [facility], including address, phone number and hours of operation. Please do not delay care.  [facility]  If you or a family member do not have a primary care provider, use the link below to schedule a visit and establish care. When you choose a [facility] primary care physician or advanced practice provider, you gain a long-term partner in health. Find a Primary Care Provider  Learn more about 's in-office and virtual care options: [facility]

## 2022-02-22 NOTE — Telephone Encounter (Signed)
    Chief Complaint: COVID positive Symptoms: Cough, fever,chills, body aches Frequency: Yesterday Pertinent Negatives: Patient denies  Disposition: '[]'$ ED /'[]'$ Urgent Care (no appt availability in office) / '[]'$ Appointment(In office/virtual)/ '[x]'$  [facility]/ '[]'$ Home Care/ '[]'$ Refused Recommended Disposition /'[]'$ [facility]/ '[]'$  Follow-up with PCP Additional Notes:    Answer Assessment - Initial Assessment Questions 1. COVID-19 DIAGNOSIS: "How do you know that you have COVID?" (e.g., positive lab test or self-test, diagnosed by doctor or NP/PA, symptoms after exposure).     Home 2. COVID-19 EXPOSURE: "Was there any known exposure to COVID before the symptoms began?" CDC Definition of close contact: within 6 feet (2 meters) for a total of 15 minutes or more over a 24-hour period.      No 3. ONSET: "When did the COVID-19 symptoms start?"      Last night 4. WORST SYMPTOM: "What is your worst symptom?" (e.g., cough, fever, shortness of breath, muscle aches)     Body aches, chills, chest pain, cough 5. COUGH: "Do you have a cough?" If Yes, ask: "How bad is the cough?"       Yes 6. FEVER: "Do you have a fever?" If Yes, ask: "What is your temperature, how was it measured, and when did it start?"     100.0 7. RESPIRATORY STATUS: "Describe your breathing?" (e.g., normal; shortness of breath, wheezing, unable to speak)      SOB 8. BETTER-SAME-WORSE: "Are you getting better, staying the same or getting worse compared to yesterday?"  If getting worse, ask, "In what way?"     Worse 9. OTHER SYMPTOMS: "Do you have any other symptoms?"  (e.g., chills, fatigue, headache, loss of smell or taste, muscle pain, sore throat)     Chills 10. HIGH RISK DISEASE: "Do you have any chronic medical problems?" (e.g., asthma, heart or lung disease, weak immune system, obesity, etc.)       COPD 11. VACCINE: "Have you had the COVID-19 vaccine?" If Yes, ask: "Which one, how many shots, when did you get  it?"       yES 12. PREGNANCY: "Is there any chance you are pregnant?" "When was your last menstrual period?"       N/A 13. O2 SATURATION MONITOR:  "Do you use an oxygen saturation monitor (pulse oximeter) at home?" If Yes, ask "What is your reading (oxygen level) today?" "What is your usual oxygen saturation reading?" (e.g., 95%)       nO  Protocols used: Coronavirus (COVID-19) Diagnosed or Suspected-A-AH

## 2022-02-22 NOTE — Progress Notes (Signed)
Virtual Visit Consent   [name], you are scheduled for a virtual visit with a [facility] provider today. Just as with appointments in the office, your consent must be obtained to participate. Your consent will be active for this visit and any virtual visit you may have with one of our providers in the next 365 days. If you have a MyChart account, a copy of this consent can be sent to you electronically.  As this is a virtual visit, video technology does not allow for your provider to perform a traditional examination. This may limit your provider's ability to fully assess your condition. If your provider identifies any concerns that need to be evaluated in person or the need to arrange testing (such as labs, EKG, etc.), we will make arrangements to do so. Although advances in technology are sophisticated, we cannot ensure that it will always work on either your end or our end. If the connection with a video visit is poor, the visit may have to be switched to a telephone visit. With either a video or telephone visit, we are not always able to ensure that we have a secure connection.  By engaging in this virtual visit, you consent to the provision of healthcare and authorize for your insurance to be billed (if applicable) for the services provided during this visit. Depending on your insurance coverage, you may receive a charge related to this service.  I need to obtain your verbal consent now. Are you willing to proceed with your visit today? Tony Cardenas has provided verbal consent on 02/22/2022 for a virtual visit (video or telephone). [name], PA-C  Date: 02/22/2022 5:55 PM  Virtual Visit via Video Note   I, [name], connected with  Tony Cardenas  ([id number], May 05, 1970) on 02/22/22 at  5:45 PM EST by a video-enabled telemedicine application and verified that I am speaking with the correct person using two identifiers.  Location: Patient: Virtual Visit Location  Patient: Home Provider: Virtual Visit Location Provider: Home Office   I discussed the limitations of evaluation and management by telemedicine and the availability of in person appointments. The patient expressed understanding and agreed to proceed.    History of Present Illness: Tony Cardenas is a 52 y.o. who identifies as a male who was assigned male at birth, and is being seen today for COVID.  Pt reports he tested positive for COVID today.  Reports symptoms started yesterday.  He complains of sore throat, weakness, shortness of breath, congestion, fever, body aches.  Reports intermittent shortness of breath. He is taking dayquil, sudafed, delsym cough, tylenol.  He reports he has been eating and drinking ok.  He has h/o COPD, he has used his rescue inhaler with some relief.  He has an appointment with his PCP scheduled for tomorrow.   HPI: HPI  Problems:  Patient Active Problem List   Diagnosis Date Noted   History of colonic polyps 11/15/2021   Polyp of sigmoid colon 11/15/2021   Tobacco use disorder 03/06/2019   Cervical fusion syndrome 02/20/2019   Chronic pain syndrome 02/20/2019   Cervical spondylosis with myelopathy and radiculopathy 12/20/2018   S/P cervical spinal fusion 10/10/2018   Cervical radicular pain 09/20/2018   Encounter for long-term (current) use of high-risk medication 06/01/2017   HTN (hypertension) 05/24/2017   Thrombocytosis 05/05/2017   Vitamin D deficiency 05/05/2017   Abnormal TSH 04/24/2017   Polyarthralgia 04/24/2017   Diastasis recti 07/28/2015   Hyperlipidemia 01/27/2015  Type 2 diabetes mellitus with hyperglycemia (HCC) 01/26/2015   Calculus of kidney 12/26/2014   GERD (gastroesophageal reflux disease) 12/26/2014   COPD (chronic obstructive pulmonary disease) ([place]) 12/26/2014   Chronic inflammatory arthritis 12/26/2014    Allergies:  Allergies  Allergen Reactions   Atorvastatin Other (See Comments)    A lot of cramping and joint pain    Hydromorphone Hcl Nausea And Vomiting   Medications:  Current Outpatient Medications:    benzonatate (TESSALON) 100 MG capsule, Take 1 capsule (100 mg total) by mouth 3 (three) times daily as needed for cough., Disp: 20 capsule, Rfl: 0   nirmatrelvir/ritonavir (PAXLOVID) 20 x 150 MG & 10 x 100MG TABS, Take 3 tablets by mouth 2 (two) times daily for 5 days. (Take nirmatrelvir 150 mg two tablets twice daily for 5 days and ritonavir 100 mg one tablet twice daily for 5 days) Patient GFR is >60, Disp: 30 tablet, Rfl: 0   albuterol (VENTOLIN HFA) 108 (90 Base) MCG/ACT inhaler, INHALE 2 PUFFS INTO THE LUNGS EVERY 6 HOURS AS NEEDED FOR WHEEZING OR SHORTNESS OF BREATH., Disp: 6.7 g, Rfl: 3   blood glucose meter kit and supplies, Dispense based on patient and insurance preference. Use up to twice times daily as directed. (FOR ICD-10 E10.9, E11.9)., Disp: 1 each, Rfl: 0   Evolocumab (REPATHA) 140 MG/ML SOSY, Inject 140 mg into the skin every 14 (fourteen) days., Disp: 2 mL, Rfl: 5   ezetimibe (ZETIA) 10 MG tablet, Take 1 tablet (10 mg total) by mouth daily., Disp: 90 tablet, Rfl: 0   fluticasone (FLONASE) 50 MCG/ACT nasal spray, PLACE 2 SPRAYS INTO BOTH NOSTRILS DAILY IN THE EVENING, Disp: 16 g, Rfl: 12   folic acid (FOLVITE) 1 MG tablet, Take 1 tablet (1 mg total) by mouth once daily, Disp: 90 tablet, Rfl: 3   glucose blood test strip, Use up to twice times daily as directed, Disp: 100 each, Rfl: 0   [START ON 02/23/2022] HYDROcodone-acetaminophen (NORCO) 7.5-325 MG tablet, Take 1 tablet by mouth every 6 (six) hours as needed for moderate pain., Disp: 120 tablet, Rfl: 0   [START ON 03/25/2022] HYDROcodone-acetaminophen (NORCO) 7.5-325 MG tablet, Take 1 tablet by mouth every 6 (six) hours as needed for moderate pain., Disp: 120 tablet, Rfl: 0   [START ON 04/24/2022] HYDROcodone-acetaminophen (NORCO) 7.5-325 MG tablet, Take 1 tablet by mouth every 6 (six) hours as needed for moderate pain., Disp: 120 tablet, Rfl:  0   Lancets (FREESTYLE) lancets, Use up to twice times daily as directed, Disp: 100 each, Rfl: 0   lisinopril (ZESTRIL) 5 MG tablet, TAKE 1 TABLET (5 MG TOTAL) BY MOUTH DAILY., Disp: 90 tablet, Rfl: 1   metFORMIN (GLUCOPHAGE-XR) 500 MG 24 hr tablet, Take 2 tablets (1,000 mg total) by mouth daily with breakfast., Disp: 180 tablet, Rfl: 3   methocarbamol (ROBAXIN) 500 MG tablet, Take 1 tablet (500 mg total) by mouth every 8 (eight) hours as needed for muscle spasms., Disp: 90 tablet, Rfl: 5   methotrexate (RHEUMATREX) 2.5 MG tablet, Take 5 tablets (12.5 mg total) by mouth every 7 (seven) days All on the same day, Disp: 20 tablet, Rfl: 3   methotrexate (RHEUMATREX) 2.5 MG tablet, Take 5 tablets (12.5 mg total) by mouth every 7 (seven) days All on the same day, Disp: 20 tablet, Rfl: 5   omega-3 acid ethyl esters (LOVAZA) 1 g capsule, Take 1 capsule (1 g total) by mouth daily., Disp: 90 capsule, Rfl: 2   pravastatin (PRAVACHOL)  10 MG tablet, Take 1 tablet (10 mg total) by mouth once a week., Disp: 52 tablet, Rfl: 0   Vitamin D, Ergocalciferol, (DRISDOL) 1.25 MG (50000 UNIT) CAPS capsule, TAKE 1 CAPSULE BY MOUTH ONCE A WEEK FOR 12 WEEKS, Disp: 12 capsule, Rfl: 1  Observations/Objective: Patient is well-developed, well-nourished in no acute distress.  Resting comfortably at home.  Head is normocephalic, atraumatic.  No labored breathing.  Speech is clear and coherent with logical content.  Patient is alert and oriented at baseline.    Assessment and Plan: 1. COVID - nirmatrelvir/ritonavir (PAXLOVID) 20 x 150 MG & 10 x 100MG TABS; Take 3 tablets by mouth 2 (two) times daily for 5 days. (Take nirmatrelvir 150 mg two tablets twice daily for 5 days and ritonavir 100 mg one tablet twice daily for 5 days) Patient GFR is >60  Dispense: 30 tablet; Refill: 0 - benzonatate (TESSALON) 100 MG capsule; Take 1 capsule (100 mg total) by mouth 3 (three) times daily as needed for cough.  Dispense: 20 capsule; Refill:  0  At this time patient in no acute distress, using rescue inhaler with relief.  Advised to keep appointment tomorrow if breathing changes. He will start paxlovid tonight.   Follow Up Instructions: I discussed the assessment and treatment plan with the patient. The patient was provided an opportunity to ask questions and all were answered. The patient agreed with the plan and demonstrated an understanding of the instructions.  A copy of instructions were sent to the patient via MyChart unless otherwise noted below.     The patient was advised to call back or seek an in-person evaluation if the symptoms worsen or if the condition fails to improve as anticipated.  Time:  I spent 17 minutes with the patient via telehealth technology discussing the above problems/concerns.    [name], PA-C

## 2022-02-23 ENCOUNTER — Telehealth: Payer: Self-pay | Admitting: Student in an Organized Health Care Education/Training Program

## 2022-02-23 ENCOUNTER — Other Ambulatory Visit: Payer: Self-pay

## 2022-02-23 ENCOUNTER — Ambulatory Visit: Payer: No Typology Code available for payment source | Admitting: Nurse Practitioner

## 2022-02-23 NOTE — Telephone Encounter (Signed)
Wife stated that husband insurance has changed. She stated that she was giving some insurance that needed so insurance will pay for patient's prescription. Please give wife call back. TY

## 2022-02-23 NOTE — Telephone Encounter (Signed)
We already have the new insurance information. Patient states a PA is needed. Will do today.

## 2022-02-27 ENCOUNTER — Other Ambulatory Visit: Payer: Self-pay | Admitting: Family Medicine

## 2022-02-28 ENCOUNTER — Other Ambulatory Visit: Payer: Self-pay | Admitting: Family Medicine

## 2022-02-28 ENCOUNTER — Other Ambulatory Visit: Payer: Self-pay

## 2022-03-01 ENCOUNTER — Other Ambulatory Visit
Admission: RE | Admit: 2022-03-01 | Disposition: A | Discharge status: Home / Self Care | Payer: No Typology Code available for payment source | Source: Home / Self Care | Attending: Nurse Practitioner | Admitting: Nurse Practitioner

## 2022-03-01 ENCOUNTER — Other Ambulatory Visit: Payer: Self-pay

## 2022-03-01 ENCOUNTER — Encounter: Payer: Self-pay | Admitting: Nurse Practitioner

## 2022-03-02 ENCOUNTER — Other Ambulatory Visit
Admission: RE | Admit: 2022-03-02 | Discharge: 2022-03-02 | Disposition: A | Discharge status: Home / Self Care | Payer: No Typology Code available for payment source | Attending: Nurse Practitioner | Admitting: Nurse Practitioner

## 2022-03-02 DIAGNOSIS — E049 Nontoxic goiter, unspecified: Secondary | ICD-10-CM | POA: Insufficient documentation

## 2022-03-02 DIAGNOSIS — R7989 Other specified abnormal findings of blood chemistry: Secondary | ICD-10-CM | POA: Insufficient documentation

## 2022-03-02 DIAGNOSIS — R09A2 Foreign body sensation, throat: Secondary | ICD-10-CM | POA: Insufficient documentation

## 2022-03-02 LAB — TSH: TSH: 0.743 u[IU]/mL (ref 0.350–4.500)

## 2022-03-02 LAB — T4, FREE: Free T4: 1.07 ng/dL (ref 0.61–1.12)

## 2022-03-02 NOTE — Telephone Encounter (Signed)
Requested medication (s) are due for refill today: yes  Requested medication (s) are on the active medication list: yes  Last refill:  09/13/21  Future visit scheduled: yes  Notes to clinic:  Manual Review: Route requests for 50,000 IU strength to the provider      Requested Prescriptions  Pending Prescriptions Disp Refills   Vitamin D, Ergocalciferol, (DRISDOL) 1.25 MG (50000 UNIT) CAPS capsule 12 capsule 1    Sig: TAKE 1 CAPSULE BY MOUTH ONCE A WEEK FOR 12 WEEKS     Endocrinology:  Vitamins - Vitamin D Supplementation 2 Failed - 02/28/2022  3:39 PM      Failed - Manual Review: Route requests for 50,000 IU strength to the provider      Failed - Vitamin D in normal range and within 360 days    Vit D, 25-Hydroxy  Date Value Ref Range Status  09/02/2021 23.1 (L) 30.0 - 100.0 ng/mL Final    Comment:    Vitamin D deficiency has been defined by the Institute of Medicine and an Endocrine Society practice guideline as a level of serum 25-OH vitamin D less than 20 ng/mL (1,2). The Endocrine Society went on to further define vitamin D insufficiency as a level between 21 and 29 ng/mL (2). 1. IOM (Institute of Medicine). 2010. Dietary reference    intakes for calcium and D. [place]: The    [employer]. 2. Holick MF, Binkley [place], Bischoff-Ferrari HA, et al.    Evaluation, treatment, and prevention of vitamin D    deficiency: an Endocrine Society clinical practice    guideline. JCEM. 2011 Jul; 96(7):1911-30.          Passed - Ca in normal range and within 360 days    Calcium  Date Value Ref Range Status  12/17/2021 9.1 8.7 - 10.2 mg/dL Final   Calcium, Total  Date Value Ref Range Status  02/11/2013 8.1 (L) 8.5 - 10.1 mg/dL Final         Passed - Valid encounter within last 12 months    Recent Outpatient Visits           2 months ago Type 2 diabetes mellitus with hyperglycemia, without long-term current use of insulin ([place])   [place], Megan P, DO   6 months ago Routine general medical examination at a health care facility   [facility] [place] P, DO   9 months ago Type 2 diabetes mellitus with hyperglycemia, without long-term current use of insulin ([place])   [place], Megan P, DO   1 year ago Type 2 diabetes mellitus with hyperglycemia, without long-term current use of insulin ([place])   [place], Megan P, DO   1 year ago Routine general medical examination at a health care facility   [facility] Valerie Roys, DO       Future Appointments             In 2 weeks Wynetta Emery, Barb Merino, DO [place], [facility]

## 2022-03-02 NOTE — Telephone Encounter (Signed)
Can you send labs to [facility] for him?

## 2022-03-03 ENCOUNTER — Other Ambulatory Visit: Payer: Self-pay

## 2022-03-03 ENCOUNTER — Encounter: Payer: Self-pay | Admitting: Nurse Practitioner

## 2022-03-03 ENCOUNTER — Ambulatory Visit (INDEPENDENT_AMBULATORY_CARE_PROVIDER_SITE_OTHER): Payer: No Typology Code available for payment source | Admitting: Nurse Practitioner

## 2022-03-03 VITALS — BP 123/79 | HR 88 | Ht 71.0 in | Wt 241.0 lb

## 2022-03-03 DIAGNOSIS — E559 Vitamin D deficiency, unspecified: Secondary | ICD-10-CM

## 2022-03-03 DIAGNOSIS — I10 Essential (primary) hypertension: Secondary | ICD-10-CM | POA: Diagnosis not present

## 2022-03-03 DIAGNOSIS — E119 Type 2 diabetes mellitus without complications: Secondary | ICD-10-CM | POA: Diagnosis not present

## 2022-03-03 DIAGNOSIS — E782 Mixed hyperlipidemia: Secondary | ICD-10-CM

## 2022-03-03 DIAGNOSIS — R7989 Other specified abnormal findings of blood chemistry: Secondary | ICD-10-CM

## 2022-03-03 LAB — THYROGLOBULIN ANTIBODY: Thyroglobulin Antibody: <1 IU/mL (ref 0.0–0.9)

## 2022-03-03 MED ORDER — GLIPIZIDE ER 5 MG PO TB24
5.0000 mg | ORAL_TABLET | Freq: Every day | ORAL | 3 refills | Status: DC
  Filled 2022-03-03: qty 90, 90d supply, fill #0
  Filled 2022-05-22: qty 90, 90d supply, fill #1
  Filled 2022-08-27: qty 90, 90d supply, fill #2
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
## 2022-03-04 ENCOUNTER — Other Ambulatory Visit: Payer: Self-pay | Admitting: Family Medicine

## 2022-03-04 ENCOUNTER — Other Ambulatory Visit: Payer: Self-pay

## 2022-03-04 LAB — T3, FREE: T3, Free: 3.4 pg/mL (ref 2.0–4.4)

## 2022-03-04 LAB — THYROID PEROXIDASE ANTIBODY: Thyroperoxidase Ab SerPl-aCnc: 29 IU/mL (ref 0–34)

## 2022-03-04 NOTE — Telephone Encounter (Signed)
Requested medication (s) are due for refill today - yes  Requested medication (s) are on the active medication list -yes  Future visit scheduled -yes  Last refill: 09/13/21 #12 1RF  Notes to clinic: high dose Rx- provider review   Requested Prescriptions  Pending Prescriptions Disp Refills   Vitamin D, Ergocalciferol, (DRISDOL) 1.25 MG (50000 UNIT) CAPS capsule [Pharmacy Med Name: Vitamin D, Ergocalciferol, (DRISDOL) 1.25 MG (50000 UNIT) Cap capsule] 12 capsule 1    Sig: TAKE 1 CAPSULE BY MOUTH ONCE A WEEK FOR 12 WEEKS     Endocrinology:  Vitamins - Vitamin D Supplementation 2 Failed - 03/04/2022  8:40 AM      Failed - Manual Review: Route requests for 50,000 IU strength to the provider      Failed - Vitamin D in normal range and within 360 days    Vit D, 25-Hydroxy  Date Value Ref Range Status  09/02/2021 23.1 (L) 30.0 - 100.0 ng/mL Final    Comment:    Vitamin D deficiency has been defined by the [place] practice guideline as a level of serum 25-OH vitamin D less than 20 ng/mL (1,2). The Endocrine Society went on to further define vitamin D insufficiency as a level between 21 and 29 ng/mL (2). 1. IOM (Institute of Medicine). 2010. Dietary reference    intakes for calcium and D. [place]: The    [employer]. 2. Holick MF, Binkley [place], Bischoff-Ferrari HA, et al.    Evaluation, treatment, and prevention of vitamin D    deficiency: an Endocrine Society clinical practice    guideline. JCEM. 2011 Jul; 96(7):1911-30.          Passed - Ca in normal range and within 360 days    Calcium  Date Value Ref Range Status  12/17/2021 9.1 8.7 - 10.2 mg/dL Final   Calcium, Total  Date Value Ref Range Status  02/11/2013 8.1 (L) 8.5 - 10.1 mg/dL Final         Passed - Valid encounter within last 12 months    Recent Outpatient Visits           2 months ago Type 2 diabetes mellitus with hyperglycemia, without long-term current use  of insulin ([place])   [place], Megan P, DO   6 months ago Routine general medical examination at a health care facility   [facility], [place] P, DO   9 months ago Type 2 diabetes mellitus with hyperglycemia, without long-term current use of insulin ([place])   [place], Megan P, DO   1 year ago Type 2 diabetes mellitus with hyperglycemia, without long-term current use of insulin ([place])   [place], Megan P, DO   1 year ago Routine general medical examination at a health care facility   [facility] Valerie Roys, DO       Future Appointments             In 2 weeks Wynetta Emery, Barb Merino, DO [place], [facility]               Requested Prescriptions  Pending Prescriptions Disp Refills   Vitamin D, Ergocalciferol, (DRISDOL) 1.25 MG (50000 UNIT) CAPS capsule [Pharmacy Med Name: Vitamin D, Ergocalciferol, (DRISDOL) 1.25 MG (50000 UNIT) Cap capsule] 12 capsule 1    Sig: TAKE 1 CAPSULE BY MOUTH ONCE  A WEEK FOR 12 WEEKS     Endocrinology:  Vitamins - Vitamin D Supplementation 2 Failed - 03/04/2022  8:40 AM      Failed - Manual Review: Route requests for 50,000 IU strength to the provider      Failed - Vitamin D in normal range and within 360 days    Vit D, 25-Hydroxy  Date Value Ref Range Status  09/02/2021 23.1 (L) 30.0 - 100.0 ng/mL Final    Comment:    Vitamin D deficiency has been defined by the [place] practice guideline as a level of serum 25-OH vitamin D less than 20 ng/mL (1,2). The Endocrine Society went on to further define vitamin D insufficiency as a level between 21 and 29 ng/mL (2). 1. IOM (Institute of Medicine). 2010. Dietary reference    intakes for calcium and D. [place]: The    [employer]. 2. Holick MF, Binkley [place],  Bischoff-Ferrari HA, et al.    Evaluation, treatment, and prevention of vitamin D    deficiency: an Endocrine Society clinical practice    guideline. JCEM. 2011 Jul; 96(7):1911-30.          Passed - Ca in normal range and within 360 days    Calcium  Date Value Ref Range Status  12/17/2021 9.1 8.7 - 10.2 mg/dL Final   Calcium, Total  Date Value Ref Range Status  02/11/2013 8.1 (L) 8.5 - 10.1 mg/dL Final         Passed - Valid encounter within last 12 months    Recent Outpatient Visits           2 months ago Type 2 diabetes mellitus with hyperglycemia, without long-term current use of insulin ([place])   [place], Megan P, DO   6 months ago Routine general medical examination at a health care facility   [facility], [place] P, DO   9 months ago Type 2 diabetes mellitus with hyperglycemia, without long-term current use of insulin ([place])   [place], Megan P, DO   1 year ago Type 2 diabetes mellitus with hyperglycemia, without long-term current use of insulin ([place])   [place], Megan P, DO   1 year ago Routine general medical examination at a health care facility   [facility] Valerie Roys, DO       Future Appointments             In 2 weeks Wynetta Emery, Barb Merino, DO [place], [facility]

## 2022-03-06 ENCOUNTER — Encounter: Payer: Self-pay | Admitting: Family Medicine

## 2022-03-16 ENCOUNTER — Other Ambulatory Visit: Payer: Self-pay

## 2022-03-16 DIAGNOSIS — Z79899 Other long term (current) drug therapy: Secondary | ICD-10-CM | POA: Diagnosis not present

## 2022-03-16 DIAGNOSIS — M4712 Other spondylosis with myelopathy, cervical region: Secondary | ICD-10-CM | POA: Diagnosis not present

## 2022-03-16 DIAGNOSIS — M4722 Other spondylosis with radiculopathy, cervical region: Secondary | ICD-10-CM | POA: Diagnosis not present

## 2022-03-16 DIAGNOSIS — M0609 Rheumatoid arthritis without rheumatoid factor, multiple sites: Secondary | ICD-10-CM | POA: Diagnosis not present

## 2022-03-16 MED ORDER — FOLIC ACID 1 MG PO TABS
ORAL_TABLET | ORAL | 3 refills | Status: DC
  Filled 2022-03-16: qty 90, 90d supply, fill #0
  Filled 2022-07-03: qty 90, 90d supply, fill #1
  Filled 2022-09-24: qty 90, 90d supply, fill #2

## 2022-03-18 ENCOUNTER — Other Ambulatory Visit: Payer: Self-pay

## 2022-03-18 ENCOUNTER — Encounter: Payer: Self-pay | Admitting: Family Medicine

## 2022-03-18 ENCOUNTER — Ambulatory Visit (INDEPENDENT_AMBULATORY_CARE_PROVIDER_SITE_OTHER): Payer: No Typology Code available for payment source | Admitting: Family Medicine

## 2022-03-18 VITALS — BP 121/76 | HR 83 | Temp 98.5°F | Wt 243.6 lb

## 2022-03-18 DIAGNOSIS — E559 Vitamin D deficiency, unspecified: Secondary | ICD-10-CM

## 2022-03-18 DIAGNOSIS — E782 Mixed hyperlipidemia: Secondary | ICD-10-CM

## 2022-03-18 DIAGNOSIS — Z7984 Long term (current) use of oral hypoglycemic drugs: Secondary | ICD-10-CM | POA: Diagnosis not present

## 2022-03-18 DIAGNOSIS — I10 Essential (primary) hypertension: Secondary | ICD-10-CM

## 2022-03-18 DIAGNOSIS — H9191 Unspecified hearing loss, right ear: Secondary | ICD-10-CM

## 2022-03-18 DIAGNOSIS — Z136 Encounter for screening for cardiovascular disorders: Secondary | ICD-10-CM

## 2022-03-18 DIAGNOSIS — Z125 Encounter for screening for malignant neoplasm of prostate: Secondary | ICD-10-CM

## 2022-03-18 DIAGNOSIS — G3184 Mild cognitive impairment, so stated: Secondary | ICD-10-CM | POA: Diagnosis not present

## 2022-03-18 DIAGNOSIS — J449 Chronic obstructive pulmonary disease, unspecified: Secondary | ICD-10-CM

## 2022-03-18 DIAGNOSIS — Z0000 Encounter for general adult medical examination without abnormal findings: Secondary | ICD-10-CM | POA: Diagnosis not present

## 2022-03-18 DIAGNOSIS — E1165 Type 2 diabetes mellitus with hyperglycemia: Secondary | ICD-10-CM | POA: Diagnosis not present

## 2022-03-18 DIAGNOSIS — Z7189 Other specified counseling: Secondary | ICD-10-CM

## 2022-03-18 LAB — URINALYSIS, ROUTINE W REFLEX MICROSCOPIC
Bilirubin, UA: NEGATIVE
Ketones, UA: NEGATIVE
Leukocytes,UA: NEGATIVE
Nitrite, UA: NEGATIVE
RBC, UA: NEGATIVE
Specific Gravity, UA: 1.025 (ref 1.005–1.030)
Urobilinogen, Ur: 0.2 mg/dL (ref 0.2–1.0)
pH, UA: 5 (ref 5.0–7.5)

## 2022-03-18 LAB — BAYER DCA HB A1C WAIVED: HB A1C (BAYER DCA - WAIVED): 9.9 % — ABNORMAL HIGH (ref 4.8–5.6)

## 2022-03-18 LAB — MICROALBUMIN, URINE WAIVED
Creatinine, Urine Waived: 200 mg/dL (ref 10–300)
Microalb, Ur Waived: 80 mg/L — ABNORMAL HIGH (ref 0–19)
Microalb/Creat Ratio: 30 mg/g — ABNORMAL HIGH (ref <30)

## 2022-03-18 MED ORDER — VITAMIN D (ERGOCALCIFEROL) 1.25 MG (50000 UNIT) PO CAPS
50000.0000 [IU] | ORAL_CAPSULE | ORAL | 1 refills | Status: AC
  Filled 2022-03-18: qty 12, 84d supply, fill #0
  Filled 2022-06-03: qty 12, 84d supply, fill #1

## 2022-03-18 MED ORDER — EZETIMIBE 10 MG PO TABS
10.0000 mg | ORAL_TABLET | Freq: Every day | ORAL | 0 refills | Status: DC
  Filled 2022-03-18 – 2022-05-22 (×2): qty 90, 90d supply, fill #0

## 2022-03-18 NOTE — Assessment & Plan Note (Signed)
Under good control on current regimen. Continue current regimen. Continue to monitor. Call with any concerns. Refills given. Labs drawn today.   

## 2022-03-18 NOTE — Assessment & Plan Note (Signed)
Under good control on current regimen. Continue current regimen. Continue to monitor. Call with any concerns. Refills given.   

## 2022-03-18 NOTE — Patient Instructions (Signed)
Preventative Services:  AAA Screening: N/A Health Risk Assessment and Personalized Prevention Plan: Done today Bone Mass Measurements: N/A CVD Screening: Done today Colon Cancer Screening: Up to date Depression Screening: Done today Diabetes Screening: Done today Glaucoma Screening: See your eye doctor Hepatitis B vaccine: N/A Hepatitis C screening: Up to date HIV Screening: Up to date Flu Vaccine: Up to date Lung cancer Screening: N/A Obesity Screening: Done today Pneumonia Vaccines (2): Up to date STI Screening: N/A PSA screening: Done today

## 2022-03-18 NOTE — Assessment & Plan Note (Signed)
Likely due to anoxic brain injury several years ago. Will get him into neurology. Referral placed today.

## 2022-03-18 NOTE — Assessment & Plan Note (Signed)
Being managed by endocrine. Not seeing them until the end of May. Labs drawn today. Slightly improved with A1c of 9.9. Continue to monitor. Call with any concerns.

## 2022-03-18 NOTE — Assessment & Plan Note (Signed)
Has not started his repatha. Will start it and recheck 3 months. Call with any concerns.

## 2022-03-18 NOTE — Progress Notes (Signed)
Interpreted by me today. NSR at 80bpm. No ST segment changes
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
## 2022-03-18 NOTE — Assessment & Plan Note (Signed)
A voluntary discussion about advance care planning including the explanation and discussion of advance directives was extensively discussed  with the patient for 3 minutes with patient present.  Explanation about the health care proxy and Living will was reviewed and packet with forms with explanation of how to fill them out was given.  During this discussion, the patient was able to identify a health care proxy as his wife Sharyn Lull and plans to fill out the paperwork required.  Patient was offered a separate [place] visit for further assistance with forms.

## 2022-03-19 LAB — CBC WITH DIFFERENTIAL/PLATELET
Basophils Absolute: 0.1 10*3/uL (ref 0.0–0.2)
Basos: 1 %
EOS (ABSOLUTE): 0.1 10*3/uL (ref 0.0–0.4)
Eos: 2 %
Hematocrit: 42.4 % (ref 37.5–51.0)
Hemoglobin: 14.8 g/dL (ref 13.0–17.7)
Immature Grans (Abs): 0 10*3/uL (ref 0.0–0.1)
Immature Granulocytes: 1 %
Lymphocytes Absolute: 2.6 10*3/uL (ref 0.7–3.1)
Lymphs: 38 %
MCH: 32.2 pg (ref 26.6–33.0)
MCHC: 34.9 g/dL (ref 31.5–35.7)
MCV: 92 fL (ref 79–97)
Monocytes Absolute: 0.4 10*3/uL (ref 0.1–0.9)
Monocytes: 6 %
Neutrophils Absolute: 3.5 10*3/uL (ref 1.4–7.0)
Neutrophils: 52 %
Platelets: 390 10*3/uL (ref 150–450)
RBC: 4.6 x10E6/uL (ref 4.14–5.80)
RDW: 13.1 % (ref 11.6–15.4)
WBC: 6.8 10*3/uL (ref 3.4–10.8)

## 2022-03-19 LAB — COMPREHENSIVE METABOLIC PANEL
ALT: 26 IU/L (ref 0–44)
AST: 19 IU/L (ref 0–40)
Albumin/Globulin Ratio: 2.3 — ABNORMAL HIGH (ref 1.2–2.2)
Albumin: 4.8 g/dL (ref 3.8–4.9)
Alkaline Phosphatase: 64 IU/L (ref 44–121)
BUN/Creatinine Ratio: 13 (ref 9–20)
BUN: 9 mg/dL (ref 6–24)
Bilirubin Total: 0.2 mg/dL (ref 0.0–1.2)
CO2: 18 mmol/L — ABNORMAL LOW (ref 20–29)
Calcium: 9.2 mg/dL (ref 8.7–10.2)
Chloride: 101 mmol/L (ref 96–106)
Creatinine, Ser: 0.7 mg/dL — ABNORMAL LOW (ref 0.76–1.27)
Globulin, Total: 2.1 g/dL (ref 1.5–4.5)
Glucose: 201 mg/dL — ABNORMAL HIGH (ref 70–99)
Potassium: 4.4 mmol/L (ref 3.5–5.2)
Sodium: 138 mmol/L (ref 134–144)
Total Protein: 6.9 g/dL (ref 6.0–8.5)
eGFR: 110 mL/min/{1.73_m2} (ref >59)

## 2022-03-19 LAB — TSH: TSH: 0.511 u[IU]/mL (ref 0.450–4.500)

## 2022-03-19 LAB — LIPID PANEL W/O CHOL/HDL RATIO
Cholesterol, Total: 215 mg/dL — ABNORMAL HIGH (ref 100–199)
HDL: 30 mg/dL — ABNORMAL LOW (ref >39)
Triglycerides: 869 mg/dL (ref 0–149)

## 2022-03-19 LAB — PSA: Prostate Specific Ag, Serum: 0.3 ng/mL (ref 0.0–4.0)

## 2022-03-21 ENCOUNTER — Other Ambulatory Visit: Payer: Self-pay

## 2022-03-21 ENCOUNTER — Other Ambulatory Visit: Payer: Self-pay | Admitting: *Deleted

## 2022-03-21 ENCOUNTER — Telehealth: Payer: Self-pay | Admitting: Student in an Organized Health Care Education/Training Program

## 2022-03-21 DIAGNOSIS — Q761 Klippel-Feil syndrome: Secondary | ICD-10-CM

## 2022-03-21 DIAGNOSIS — G894 Chronic pain syndrome: Secondary | ICD-10-CM

## 2022-03-21 DIAGNOSIS — M5412 Radiculopathy, cervical region: Secondary | ICD-10-CM

## 2022-03-21 NOTE — Telephone Encounter (Signed)
Wife called stated that she had spoke with pharmacy and was told that since patient had change insurance that it had to be a 7 day supply send in so a PA can be done, also so that a 30 day supply can be approve. Please give wife after 2pm [phone]. TY

## 2022-03-21 NOTE — Telephone Encounter (Signed)
PA sent in via [facility].  LP

## 2022-03-22 ENCOUNTER — Other Ambulatory Visit: Payer: Self-pay | Admitting: Student in an Organized Health Care Education/Training Program

## 2022-03-22 ENCOUNTER — Telehealth: Payer: Self-pay | Admitting: Student in an Organized Health Care Education/Training Program

## 2022-03-22 ENCOUNTER — Other Ambulatory Visit: Payer: Self-pay

## 2022-03-22 ENCOUNTER — Other Ambulatory Visit (HOSPITAL_COMMUNITY): Payer: Self-pay

## 2022-03-22 DIAGNOSIS — G894 Chronic pain syndrome: Secondary | ICD-10-CM

## 2022-03-22 DIAGNOSIS — M5412 Radiculopathy, cervical region: Secondary | ICD-10-CM

## 2022-03-22 DIAGNOSIS — Q761 Klippel-Feil syndrome: Secondary | ICD-10-CM

## 2022-03-22 MED ORDER — HYDROCODONE-ACETAMINOPHEN 7.5-325 MG PO TABS
1.0000 | ORAL_TABLET | Freq: Four times a day (QID) | ORAL | 0 refills | Status: DC | PRN
  Filled 2022-03-22: qty 120, 30d supply, fill #0

## 2022-03-22 MED ORDER — HYDROCODONE-ACETAMINOPHEN 7.5-325 MG PO TABS: 1.0000 | ORAL_TABLET | Freq: Four times a day (QID) | ORAL | 0 refills | Status: DC | PRN

## 2022-03-22 NOTE — Telephone Encounter (Signed)
Spoke with pharmacist at [facility] and she wanted the script resent over and she was going to check to see if she could over ride the 7 day fill and she would let us know.

## 2022-03-22 NOTE — Telephone Encounter (Signed)
Patients wife called to find out about 7day script for his pain med Oxycodone. He will be out of meds Friday and insurance says they will only pay for 7 day

## 2022-03-23 ENCOUNTER — Other Ambulatory Visit: Payer: Self-pay

## 2022-04-20 ENCOUNTER — Other Ambulatory Visit: Payer: Self-pay

## 2022-04-20 ENCOUNTER — Other Ambulatory Visit: Payer: Self-pay | Admitting: Student in an Organized Health Care Education/Training Program

## 2022-04-20 DIAGNOSIS — Q761 Klippel-Feil syndrome: Secondary | ICD-10-CM

## 2022-04-20 DIAGNOSIS — G894 Chronic pain syndrome: Secondary | ICD-10-CM

## 2022-04-20 DIAGNOSIS — M5412 Radiculopathy, cervical region: Secondary | ICD-10-CM

## 2022-04-20 MED FILL — Lisinopril Tab 5 MG: ORAL | 90 days supply | Qty: 90 | Fill #1 | Status: AC

## 2022-04-21 ENCOUNTER — Other Ambulatory Visit: Payer: Self-pay | Admitting: *Deleted

## 2022-04-21 ENCOUNTER — Telehealth: Payer: Self-pay | Admitting: Student in an Organized Health Care Education/Training Program

## 2022-04-21 ENCOUNTER — Other Ambulatory Visit: Payer: Self-pay

## 2022-04-21 DIAGNOSIS — G894 Chronic pain syndrome: Secondary | ICD-10-CM

## 2022-04-21 DIAGNOSIS — Q761 Klippel-Feil syndrome: Secondary | ICD-10-CM

## 2022-04-21 DIAGNOSIS — M5412 Radiculopathy, cervical region: Secondary | ICD-10-CM

## 2022-04-21 MED ORDER — HYDROCODONE-ACETAMINOPHEN 7.5-325 MG PO TABS
1.0000 | ORAL_TABLET | Freq: Four times a day (QID) | ORAL | 0 refills | Status: DC | PRN
Start: 2022-04-21 — End: 2022-05-12
  Filled 2022-04-21: qty 120, 30d supply, fill #0

## 2022-04-21 NOTE — Telephone Encounter (Signed)
Prescription was discontinued by someone at a different office.Rx request sent to Dr. Laban Emperor.

## 2022-04-21 NOTE — Telephone Encounter (Signed)
Please check with pharmacy and see if they have a script to fill on 4-13 for this patient. He has called pharmacy and was told they do not have a script.

## 2022-04-21 NOTE — Telephone Encounter (Signed)
Patient notified

## 2022-04-21 NOTE — Telephone Encounter (Signed)
error 

## 2022-04-25 ENCOUNTER — Other Ambulatory Visit: Payer: Self-pay

## 2022-04-26 ENCOUNTER — Other Ambulatory Visit: Payer: Self-pay

## 2022-05-12 ENCOUNTER — Encounter: Payer: Self-pay | Admitting: Family Medicine

## 2022-05-12 ENCOUNTER — Ambulatory Visit
Payer: No Typology Code available for payment source | Attending: Student in an Organized Health Care Education/Training Program | Admitting: Student in an Organized Health Care Education/Training Program

## 2022-05-12 ENCOUNTER — Other Ambulatory Visit: Payer: Self-pay

## 2022-05-12 ENCOUNTER — Ambulatory Visit (INDEPENDENT_AMBULATORY_CARE_PROVIDER_SITE_OTHER): Payer: No Typology Code available for payment source | Admitting: Family Medicine

## 2022-05-12 ENCOUNTER — Encounter: Payer: Self-pay | Admitting: Student in an Organized Health Care Education/Training Program

## 2022-05-12 VITALS — BP 125/70 | HR 79 | Temp 98.1°F | Ht 71.0 in | Wt 245.0 lb

## 2022-05-12 VITALS — BP 140/80 | HR 81 | Temp 98.1°F | Resp 17 | Ht 71.0 in | Wt 240.0 lb

## 2022-05-12 DIAGNOSIS — G894 Chronic pain syndrome: Secondary | ICD-10-CM | POA: Diagnosis not present

## 2022-05-12 DIAGNOSIS — Z981 Arthrodesis status: Secondary | ICD-10-CM | POA: Diagnosis not present

## 2022-05-12 DIAGNOSIS — M5412 Radiculopathy, cervical region: Secondary | ICD-10-CM | POA: Diagnosis not present

## 2022-05-12 DIAGNOSIS — Q761 Klippel-Feil syndrome: Secondary | ICD-10-CM | POA: Diagnosis not present

## 2022-05-12 DIAGNOSIS — M542 Cervicalgia: Secondary | ICD-10-CM | POA: Insufficient documentation

## 2022-05-12 DIAGNOSIS — J029 Acute pharyngitis, unspecified: Secondary | ICD-10-CM | POA: Diagnosis not present

## 2022-05-12 LAB — RAPID STREP SCREEN (MED CTR MEBANE ONLY): Strep Gp A Ag, IA W/Reflex: NEGATIVE

## 2022-05-12 MED ORDER — PREDNISONE 50 MG PO TABS
50.0000 mg | ORAL_TABLET | Freq: Every day | ORAL | 0 refills | Status: DC
  Filled 2022-05-12: qty 5, 5d supply, fill #0

## 2022-05-12 MED ORDER — HYDROCODONE-ACETAMINOPHEN 7.5-325 MG PO TABS
1.0000 | ORAL_TABLET | Freq: Four times a day (QID) | ORAL | 0 refills | Status: AC | PRN
Start: 2022-06-20 — End: 2022-07-20
  Filled 2022-06-20 (×2): qty 120, 30d supply, fill #0

## 2022-05-12 MED ORDER — HYDROCODONE-ACETAMINOPHEN 7.5-325 MG PO TABS
1.0000 | ORAL_TABLET | Freq: Four times a day (QID) | ORAL | 0 refills | Status: AC | PRN
Start: 2022-05-20 — End: 2022-06-19
  Filled 2022-05-20: qty 120, 30d supply, fill #0

## 2022-05-12 MED ORDER — LIDOCAINE VISCOUS HCL 2 % MT SOLN
15.0000 mL | OROMUCOSAL | 0 refills | Status: DC | PRN
  Filled 2022-05-12: qty 100, 1d supply, fill #0

## 2022-05-12 MED ORDER — HYDROCODONE-ACETAMINOPHEN 7.5-325 MG PO TABS
1.0000 | ORAL_TABLET | Freq: Four times a day (QID) | ORAL | 0 refills | Status: DC | PRN
Start: 2022-07-20 — End: 2022-08-11
  Filled 2022-07-20 (×2): qty 120, 30d supply, fill #0
  Filled ????-??-??: fill #0

## 2022-05-12 NOTE — Progress Notes (Signed)
BP 125/70   Pulse 79   Temp 98.1 F (36.7 C) (Oral)   Ht 5\' 11"  (1.803 m)   Wt 245 lb (111.1 kg)   SpO2 97%   BMI 34.17 kg/m    Subjective:    Patient ID: Tony Cardenas, male    DOB: 02/24/1970, 52 y.o.   MRN: [id number]  HPI: Tony Cardenas is a 52 y.o. male  Chief Complaint  Patient presents with   Sore Throat   Ear Fullness   Cough    Patient says his wife was recently seen here for being sick. Patient says he has been sick for the past few days. Patient says he has tried over the counter Sudafed, DayQuil, and over the counter cough syrup. Patient says it starting with nasal congestion and now going into his chest.    UPPER RESPIRATORY TRACT INFECTION Duration: 3-4 days Worst symptom: congestion and sore throat Fever: no Cough: yes Shortness of breath: yes Wheezing: yes Chest pain: no Chest tightness: no Chest congestion: no Nasal congestion: yes Runny nose: no Post nasal drip: yes Sneezing: no Sore throat: yes Swollen glands: no Sinus pressure: no Headache: no Face pain: no Toothache: no Ear pain: yes bilateral Ear pressure: yes bilateral Eyes red/itching:no Eye drainage/crusting: no  Vomiting: no Rash: no Fatigue: yes Sick contacts: yes Strep contacts: no  Context: worse Recurrent sinusitis: no Relief with OTC cold/cough medications: no  Treatments attempted: sudafed and dayquil    Relevant past medical, surgical, family and social history reviewed and updated as indicated. Interim medical history since our last visit reviewed. Allergies and medications reviewed and updated.  Review of Systems  Constitutional: Negative.   HENT:  Positive for congestion, ear pain, postnasal drip, rhinorrhea, sinus pressure and sore throat. Negative for dental problem, drooling, ear discharge, facial swelling, hearing loss, mouth sores, nosebleeds, sinus pain, sneezing, tinnitus, trouble swallowing and voice change.   Respiratory:  Positive for cough and  shortness of breath. Negative for apnea, choking, chest tightness, wheezing and stridor.   Cardiovascular: Negative.   Gastrointestinal: Negative.   Musculoskeletal: Negative.   Skin: Negative.   Neurological: Negative.   Psychiatric/Behavioral: Negative.      Per HPI unless specifically indicated above     Objective:    BP 125/70   Pulse 79   Temp 98.1 F (36.7 C) (Oral)   Ht 5\' 11"  (1.803 m)   Wt 245 lb (111.1 kg)   SpO2 97%   BMI 34.17 kg/m   Wt Readings from Last 3 Encounters:  05/12/22 245 lb (111.1 kg)  05/12/22 240 lb (108.9 kg)  03/18/22 243 lb 9.6 oz (110.5 kg)    Physical Exam Vitals and nursing note reviewed.  Constitutional:      General: He is not in acute distress.    Appearance: Normal appearance. He is well-developed. He is obese. He is not ill-appearing, toxic-appearing or diaphoretic.  HENT:     Head: Normocephalic and atraumatic.     Right Ear: Tympanic membrane, ear canal and external ear normal.     Left Ear: Tympanic membrane, ear canal and external ear normal.     Nose: Congestion and rhinorrhea present.     Mouth/Throat:     Mouth: Mucous membranes are moist.     Pharynx: Oropharynx is clear. Posterior oropharyngeal erythema present.     Tonsils: No tonsillar exudate or tonsillar abscesses.  Eyes:     General: No scleral icterus.  Right eye: No discharge.        Left eye: No discharge.     Extraocular Movements: Extraocular movements intact.     Conjunctiva/sclera: Conjunctivae normal.     Pupils: Pupils are equal, round, and reactive to light.  Cardiovascular:     Rate and Rhythm: Normal rate and regular rhythm.     Pulses: Normal pulses.     Heart sounds: Normal heart sounds. No murmur heard.    No friction rub. No gallop.  Pulmonary:     Effort: Pulmonary effort is normal. No respiratory distress.     Breath sounds: Normal breath sounds. No stridor. No wheezing, rhonchi or rales.  Chest:     Chest wall: No tenderness.   Musculoskeletal:        General: Normal range of motion.     Cervical back: Normal range of motion and neck supple.  Skin:    General: Skin is warm and dry.     Capillary Refill: Capillary refill takes less than 2 seconds.     Coloration: Skin is not jaundiced or pale.     Findings: No bruising, erythema, lesion or rash.  Neurological:     General: No focal deficit present.     Mental Status: He is alert and oriented to person, place, and time. Mental status is at baseline.  Psychiatric:        Mood and Affect: Mood normal.        Behavior: Behavior normal.        Thought Content: Thought content normal.        Judgment: Judgment normal.     Results for orders placed or performed in visit on 03/18/22  Lipid Panel w/o Chol/HDL Ratio  Result Value Ref Range   Cholesterol, Total 215 (H) 100 - 199 mg/dL   Triglycerides 161 (HH) 0 - 149 mg/dL   HDL 30 (L) >09 mg/dL   VLDL Cholesterol Cal Comment (A) 5 - 40 mg/dL   LDL Chol Calc (NIH) Comment (A) 0 - 99 mg/dL  Comprehensive metabolic panel  Result Value Ref Range   Glucose 201 (H) 70 - 99 mg/dL   BUN 9 6 - 24 mg/dL   Creatinine, Ser 6.04 (L) 0.76 - 1.27 mg/dL   eGFR 540 >98 JX/BJY/7.82   BUN/Creatinine Ratio 13 9 - 20   Sodium 138 134 - 144 mmol/L   Potassium 4.4 3.5 - 5.2 mmol/L   Chloride 101 96 - 106 mmol/L   CO2 18 (L) 20 - 29 mmol/L   Calcium 9.2 8.7 - 10.2 mg/dL   Total Protein 6.9 6.0 - 8.5 g/dL   Albumin 4.8 3.8 - 4.9 g/dL   Globulin, Total 2.1 1.5 - 4.5 g/dL   Albumin/Globulin Ratio 2.3 (H) 1.2 - 2.2   Bilirubin Total 0.2 0.0 - 1.2 mg/dL   Alkaline Phosphatase 64 44 - 121 IU/L   AST 19 0 - 40 IU/L   ALT 26 0 - 44 IU/L  Bayer DCA Hb A1c Waived  Result Value Ref Range   HB A1C (BAYER DCA - WAIVED) 9.9 (H) 4.8 - 5.6 %  CBC with Differential/Platelet  Result Value Ref Range   WBC 6.8 3.4 - 10.8 x10E3/uL   RBC 4.60 4.14 - 5.80 x10E6/uL   Hemoglobin 14.8 13.0 - 17.7 g/dL   Hematocrit 95.6 21.3 - 51.0 %   MCV 92  79 - 97 fL   MCH 32.2 26.6 - 33.0 pg   MCHC 34.9 31.5 -  35.7 g/dL   RDW 11.9 14.7 - 82.9 %   Platelets 390 150 - 450 x10E3/uL   Neutrophils 52 Not Estab. %   Lymphs 38 Not Estab. %   Monocytes 6 Not Estab. %   Eos 2 Not Estab. %   Basos 1 Not Estab. %   Neutrophils Absolute 3.5 1.4 - 7.0 x10E3/uL   Lymphocytes Absolute 2.6 0.7 - 3.1 x10E3/uL   Monocytes Absolute 0.4 0.1 - 0.9 x10E3/uL   EOS (ABSOLUTE) 0.1 0.0 - 0.4 x10E3/uL   Basophils Absolute 0.1 0.0 - 0.2 x10E3/uL   Immature Granulocytes 1 Not Estab. %   Immature Grans (Abs) 0.0 0.0 - 0.1 x10E3/uL  Microalbumin, Urine Waived  Result Value Ref Range   Microalb, Ur Waived 80 (H) 0 - 19 mg/L   Creatinine, Urine Waived 200 10 - 300 mg/dL   Microalb/Creat Ratio 30 (H) <30 mg/g  TSH  Result Value Ref Range   TSH 0.511 0.450 - 4.500 uIU/mL  Urinalysis, Routine w reflex microscopic  Result Value Ref Range   Specific Gravity, UA 1.025 1.005 - 1.030   pH, UA 5.0 5.0 - 7.5   Color, UA Yellow Yellow   Appearance Ur Clear Clear   Leukocytes,UA Negative Negative   Protein,UA Trace Negative/Trace   Glucose, UA 3+ (A) Negative   Ketones, UA Negative Negative   RBC, UA Negative Negative   Bilirubin, UA Negative Negative   Urobilinogen, Ur 0.2 0.2 - 1.0 mg/dL   Nitrite, UA Negative Negative   Microscopic Examination Comment   PSA  Result Value Ref Range   Prostate Specific Ag, Serum 0.3 0.0 - 4.0 ng/mL      Assessment & Plan:   Problem List Items Addressed This Visit   None Visit Diagnoses     Sore throat    -  Primary   Strep negative. Will treat with prednisone and lidocaine. Call if not getting better or getting worse.   Relevant Orders   Rapid Strep Screen ([facility] ONLY)   Novel Coronavirus, NAA ([facility])        Follow up plan: Return if symptoms worsen or fail to improve.

## 2022-05-12 NOTE — Progress Notes (Signed)
PROVIDER NOTE: Information contained herein reflects review and annotations entered in association with encounter. Interpretation of such information and data should be left to medically-trained personnel. Information provided to patient can be located elsewhere in the medical record under "Patient Instructions". Document created using STT-dictation technology, any transcriptional errors that may result from process are unintentional.    Patient: Tony Cardenas  Service Category: E/M  Provider: Edward Jolly, MD  DOB: 08-06-1970  DOS: 08/18/2020  Specialty: Interventional Pain Management  MRN: [id number]  Setting: Ambulatory outpatient  PCP: Dorcas Carrow, DO  Type: Established Patient    Referring Provider: Dorcas Carrow, DO  Location: Office  Delivery: Face-to-face     HPI  [name], a 52 y.o. year old male, is here today because of his Chronic pain syndrome [G89.4]. Tony Cardenas primary complain today is Neck Pain  Last encounter: My last encounter with him was on 01/27/22 Pertinent problems: Tony Cardenas has S/P cervical spinal fusion; Cervical radicular pain; Cervical fusion syndrome; Chronic pain syndrome; and Cervical spondylosis with myelopathy and radiculopathy on their pertinent problem list. Pain Assessment: Severity of Chronic pain is reported as a 9 /10. Location: Neck Right, Left, Mid/to shoulders bilat and down right arm to fingers; right side is worse. Onset: More than a month ago. Quality: Aching, Dull, Burning. Timing: Constant. Modifying factor(s): meds. Vitals:  height is 5\' 11"  (1.803 m) and weight is 240 lb (108.9 kg). His temporal temperature is 98.1 F (36.7 C). His blood pressure is 140/80 (abnormal) and his pulse is 81. His respiration is 17 and oxygen saturation is 98%.   Reason for encounter: medication management.    No change in medical history since last visit.  Patient's pain is at baseline.  Patient continues multimodal pain regimen as  prescribed.  States that it provides pain relief and improvement in functional status. Receives benefit from [employer]   Pharmacotherapy Assessment  Analgesic: Hydrocodone 7.5 mg QID PRN   Monitoring: [place] PMP: PDMP reviewed during this encounter.       Pharmacotherapy: No side-effects or adverse reactions reported. Compliance: No problems identified. Effectiveness: Clinically acceptable.  UDS:  Summary  Date Value Ref Range Status  01/27/2022 Note  Final    Comment:    ==================================================================== [employer] Select [employer] (MW) ==================================================================== Test                             Result       Flag       Units  Drug Present and Declared for Prescription Verification   Hydrocodone                    2440         EXPECTED   ng/mg creat   Hydromorphone                  1400         EXPECTED   ng/mg creat   Dihydrocodeine                 320          EXPECTED   ng/mg creat   Norhydrocodone                 1114         EXPECTED   ng/mg creat    Sources of hydrocodone include scheduled prescription medications.    Hydromorphone, dihydrocodeine  and norhydrocodone are expected    metabolites of hydrocodone. Hydromorphone and dihydrocodeine are    also available as scheduled prescription medications.  ==================================================================== Test                      Result    Flag   Units      Ref Range   Creatinine              50               mg/dL      >=16 ==================================================================== Declared Medications:  The flagging and interpretation on this report are based on the  following declared medications.  Unexpected results may arise from  inaccuracies in the declared medications.   **Note: The testing scope of this panel includes these medications:   Hydrocodone (Norco)   **Note: The testing scope of this panel does not include the   following reported medications:   Acetaminophen (Norco)  Albuterol (Ventolin HFA)  Evolocumab (Repatha)  Ezetimibe (Zetia)  Fluticasone (Flonase)  Folic Acid  Lisinopril (Zestril)  Metformin (Glucophage)  Methocarbamol (Robaxin)  Methotrexate  Omega-3 Fatty Acids  Pravastatin (Pravachol)  Vitamin D2 (Drisdol) ==================================================================== For clinical consultation, please call [phone]. ====================================================================        ROS  Constitutional: Denies any fever or chills Gastrointestinal: No reported hemesis, hematochezia, vomiting, or acute GI distress Musculoskeletal:  mild cervical and trapezius pain Neurological: No reported episodes of acute onset apraxia, aphasia, dysarthria, agnosia, amnesia, paralysis, loss of coordination, or loss of consciousness  Medication Review  Evolocumab, FreeStyle Freedom Lite, HYDROcodone-acetaminophen, Vitamin D (Ergocalciferol), albuterol, ezetimibe, fluticasone, folic acid, freestyle, glipiZIDE, glucose blood, lisinopril, metFORMIN, methocarbamol, methotrexate, omega-3 acid ethyl esters, and pravastatin  History Review  Allergy: Tony Cardenas is allergic to atorvastatin and hydromorphone hcl. Drug: Tony Cardenas  reports no history of drug use. Alcohol:  reports no history of alcohol use. Tobacco:  reports that he quit smoking about 3 years ago. His smoking use included cigarettes. He has a 10.00 pack-year smoking history. He quit smokeless tobacco use about 6 years ago.  His smokeless tobacco use included chew. Social: Tony Cardenas  reports that he quit smoking about 3 years ago. His smoking use included cigarettes. He has a 10.00 pack-year smoking history. He quit smokeless tobacco use about 6 years ago.  His smokeless tobacco use included chew. He reports that he does not drink alcohol and does not use drugs. Medical:  has a past medical history of Arthritis,  COPD (chronic obstructive pulmonary disease) (HCC), Diabetes mellitus without complication (HCC), GERD (gastroesophageal reflux disease), Hyperlipidemia, Hypertension, and Neck pain (1989). Surgical: Tony Cardenas  has a past surgical history that includes Neck surgery (1989); Knee surgery (Right); Colonoscopy (N/A, 06/02/2014); Polypectomy (06/02/2014); Appendectomy; Esophagogastroduodenoscopy (egd) with propofol (N/A, 07/29/2016); Back surgery; Anterior cervical corpectomy (N/A, 10/10/2018); Hernia repair (over 10 years ago); Anterior cervical corpectomy (N/A, 05/01/2019); Posterior cervical fusion/foraminotomy (N/A, 05/01/2019); and Colonoscopy with propofol (N/A, 11/15/2021). Family: family history includes Alcohol abuse in his father; COPD in his father; Cancer in his mother; Diabetes in his brother, father, and paternal grandmother.  Laboratory Chemistry Profile   Renal Lab Results  Component Value Date   BUN 9 03/18/2022   CREATININE 0.70 (L) 03/18/2022   BCR 13 03/18/2022   GFRAA 114 12/16/2019   GFRNONAA >60 11/11/2021     Hepatic Lab Results  Component Value Date   AST 19 03/18/2022   ALT 26 03/18/2022  ALBUMIN 4.8 03/18/2022   ALKPHOS 64 03/18/2022   HCVAB NON REACTIVE 03/24/2020     Electrolytes Lab Results  Component Value Date   NA 138 03/18/2022   K 4.4 03/18/2022   CL 101 03/18/2022   CALCIUM 9.2 03/18/2022     Bone Lab Results  Component Value Date   VD25OH 23.1 (L) 09/02/2021     Inflammation (CRP: Acute Phase) (ESR: Chronic Phase) Lab Results  Component Value Date   CRP 0.7 05/26/2020   ESRSEDRATE 31 (H) 11/11/2021       Note: Above Lab results reviewed.  Recent Imaging Review  US THYROID CLINICAL DATA:  Goiter.  Neck compressive symptoms.  EXAM: THYROID ULTRASOUND  TECHNIQUE: Ultrasound examination of the thyroid gland and adjacent soft tissues was performed.  COMPARISON:  06/16/2017  FINDINGS: Parenchymal Echotexture: Mildly  heterogenous  Isthmus: 0.7 cm  Right lobe: 6.7 x 2.9 x 3.4 cm  Left lobe: 6.7 x 2.9 x 2.6 cm  _________________________________________________________  Estimated total number of nodules >/= 1 cm: 0  Number of spongiform nodules >/=  2 cm not described below (TR1): 0  Number of mixed cystic and solid nodules >/= 1.5 cm not described below (TR2): 0  _________________________________________________________  No discrete nodules are seen within the thyroid gland. No enlarged or abnormal appearing lymph nodes are identified.  IMPRESSION: Enlarged and mildly heterogeneous thyroid gland without discrete nodules. Overall volume of the thyroid gland is similar to the 2019 study.  The above is in keeping with the ACR TI-RADS recommendations - J Am Coll Radiol 2017;14:587-595.  Electronically Signed   By: Irish Lack M.D.   On: 12/13/2021 15:02  Note: Reviewed        Physical Exam  General appearance: Well nourished, well developed, and well hydrated. In no apparent acute distress Mental status: Alert, oriented x 3 (person, place, & time)       Respiratory: No evidence of acute respiratory distress Eyes: PERLA Vitals: BP (!) 140/80   Pulse 81   Temp 98.1 F (36.7 C) (Temporal)   Resp 17   Ht 5\' 11"  (1.803 m)   Wt 240 lb (108.9 kg)   SpO2 98%   BMI 33.47 kg/m  BMI: Estimated body mass index is 33.47 kg/m as calculated from the following:   Height as of this encounter: 5\' 11"  (1.803 m).   Weight as of this encounter: 240 lb (108.9 kg). Ideal: Ideal body weight: 75.3 kg (166 lb 0.1 oz) Adjusted ideal body weight: 88.7 kg (195 lb 9.7 oz)  Limited range of motion with cervical extension.  Tenderness to palpation along trapezius and deltoid. Neuropathic pain pattern of upper extremity, persistent Neuropathic pain of bilateral lower extremity   Assessment   Status Diagnosis  Controlled Controlled Controlled 1. Chronic pain syndrome   2. Cervical fusion syndrome    3. Cervical radicular pain   4. Cervicalgia   5. S/P cervical spinal fusion          Plan of Care   Tony Cardenas has a current medication list which includes the following long-term medication(s): albuterol, ezetimibe, fluticasone, glipizide, lisinopril, metformin, omega-3 acid ethyl esters, and pravastatin.  Requested Prescriptions   Signed Prescriptions Disp Refills   HYDROcodone-acetaminophen (NORCO) 7.5-325 MG tablet 120 tablet 0    Sig: Take 1 tablet by mouth every 6 (six) hours as needed for moderate pain.   HYDROcodone-acetaminophen (NORCO) 7.5-325 MG tablet 120 tablet 0    Sig: Take 1 tablet by  mouth every 6 (six) hours as needed for moderate pain.   HYDROcodone-acetaminophen (NORCO) 7.5-325 MG tablet 120 tablet 0    Sig: Take 1 tablet by mouth every 6 (six) hours as needed for moderate pain.   Continue Robaxin prn Continue with blood glucose management   Discussed Qutenza, capsaicin 8% treatment for painful diabetic neuropathy of lower extremity.  Follow-up plan:   Return in about 14 weeks (around 08/18/2022) for Medication Management, in person.   Recent Visits Date Type Provider Dept  02/16/22 Procedure visit Edward Jolly, MD [facility]  Showing recent visits within past 90 days and meeting all other requirements Today's Visits Date Type Provider Dept  05/12/22 Office Visit Edward Jolly, MD [facility]  Showing today's visits and meeting all other requirements Future Appointments No visits were found meeting these conditions. Showing future appointments within next 90 days and meeting all other requirements  I discussed the assessment and treatment plan with the patient. The patient was provided an opportunity to ask questions and all were answered. The patient agreed with the plan and demonstrated an understanding of the instructions.  Patient advised to call back or seek an in-person evaluation if the symptoms or condition  worsens.  Duration of encounter: .  Note by: Edward Jolly, MD Date: 05/12/2022; Time: 8:18 AM

## 2022-05-12 NOTE — Progress Notes (Signed)
Nursing Pain Medication Assessment:  Safety precautions to be maintained throughout the outpatient stay will include: orient to surroundings, keep bed in low position, maintain call bell within reach at all times, provide assistance with transfer out of bed and ambulation.  Medication Inspection Compliance: Pill count conducted under aseptic conditions, in front of the patient. Neither the pills nor the bottle was removed from the patient's sight at any time. Once count was completed pills were immediately returned to the patient in their original bottle.  Medication: Hydrocodone/APAP Pill/Patch Count:  41 of 120 pills remain Pill/Patch Appearance: Markings consistent with prescribed medication Bottle Appearance: Standard pharmacy container. Clearly labeled. Filled Date: 4 / 56 / 2024 Last Medication intake:  Today

## 2022-05-14 LAB — NOVEL CORONAVIRUS, NAA: SARS-CoV-2, NAA: NOT DETECTED

## 2022-05-14 LAB — CULTURE, GROUP A STREP

## 2022-05-17 ENCOUNTER — Other Ambulatory Visit: Payer: Self-pay

## 2022-05-17 ENCOUNTER — Ambulatory Visit: Payer: Self-pay | Admitting: *Deleted

## 2022-05-17 NOTE — Telephone Encounter (Signed)
Sore throat/Ears   Pt not feeling better after taking all that he was prescribed. Seeking further assistance, has significant throat and ear pain. Cough  Best contact: [phone]       Chief Complaint: Cough Symptoms: Productive cough, yellowish.Has worsened since initially seen OV 05/12/22. Cough keeping awake at HS, bad spells during day. Frequency: Yesterday Pertinent Negatives: Patient denies fever, wheezing, SOB Disposition: [] ED /[] Urgent Care (no appt availability in office) / [] Appointment(In office/virtual)/ []  [facility]/ [] Home Care/ [] Refused Recommended Disposition /[] [facility]/ [x]  Follow-up with PCP Additional Notes: Pt seen OV 05/12/22. States felt better while on Prednisone, after completed 05/16/22, symptoms returned, cough worsened. States "Feels like everything just moved into my chest."  States was advised to CB if no improvement. Care advise provided, verbalizes understanding.   Please advise: [phone]  Reason for Disposition  [1] Continuous (nonstop) coughing interferes with work or school AND [2] no improvement using cough treatment per Care Advice  Answer Assessment - Initial Assessment Questions 1. ONSET: "When did the cough begin?"      Worse since seen 2. SEVERITY: "How bad is the cough today?"      Bad spells, keeps awake at night. 3. SPUTUM: "Describe the color of your sputum" (none, dry cough; clear, white, yellow, green)     Yellowish 4. HEMOPTYSIS: "Are you coughing up any blood?" If so ask: "How much?" (flecks, streaks, tablespoons, etc.)     No 5. DIFFICULTY BREATHING: "Are you having difficulty breathing?" If Yes, ask: "How bad is it?" (e.g., mild, moderate, severe)    - MILD: No SOB at rest, mild SOB with walking, speaks normally in sentences, can lie down, no retractions, pulse < 100.    - MODERATE: SOB at rest, SOB with minimal exertion and prefers to sit, cannot lie down flat, speaks in phrases, mild retractions,  audible wheezing, pulse 100-120.    - SEVERE: Very SOB at rest, speaks in single words, struggling to breathe, sitting hunched forward, retractions, pulse > 120      No 6. FEVER: "Do you have a fever?" If Yes, ask: "What is your temperature, how was it measured, and when did it start?"     No 10. OTHER SYMPTOMS: "Do you have any other symptoms?" (e.g., runny nose, wheezing, chest pain)       No  Protocols used: Cough - Acute Productive-A-AH

## 2022-05-17 NOTE — Telephone Encounter (Signed)
Patient's wife called pharmacy to have med refill ready to pick up when due and was told the pharmacy was closed on Saturday the 11th and that we would need to send in a new script to fill on Friday. Please call pharmacy and advise patient.

## 2022-05-18 ENCOUNTER — Other Ambulatory Visit: Payer: Self-pay

## 2022-05-18 ENCOUNTER — Ambulatory Visit (INDEPENDENT_AMBULATORY_CARE_PROVIDER_SITE_OTHER): Payer: No Typology Code available for payment source | Admitting: Family Medicine

## 2022-05-18 ENCOUNTER — Encounter: Payer: Self-pay | Admitting: Family Medicine

## 2022-05-18 VITALS — BP 122/71 | HR 97 | Temp 98.6°F | Ht 71.0 in | Wt 244.6 lb

## 2022-05-18 DIAGNOSIS — J0100 Acute maxillary sinusitis, unspecified: Secondary | ICD-10-CM | POA: Diagnosis not present

## 2022-05-18 MED ORDER — AMOXICILLIN-POT CLAVULANATE 875-125 MG PO TABS
1.0000 | ORAL_TABLET | Freq: Two times a day (BID) | ORAL | 0 refills | Status: DC
  Filled 2022-05-18: qty 20, 10d supply, fill #0

## 2022-05-18 NOTE — Progress Notes (Signed)
BP 122/71   Pulse 97   Temp 98.6 F (37 C) (Oral)   Ht 5\' 11"  (1.803 m)   Wt 244 lb 9.6 oz (110.9 kg)   SpO2 97%   BMI 34.11 kg/m    Subjective:    Patient ID: Tony Cardenas, male    DOB: May 03, 1970, 52 y.o.   MRN: [id number]  HPI: Tony Cardenas is a 52 y.o. male  Chief Complaint  Patient presents with   Ear Pain   Cough   Sore Throat    Patient says as soon as he completed the Prednisone, the pain is back and says he now has a cough and it has yellow and green mucus. Patient says he think this is flaring up his COPD cause he is coughing more and it is hard for him to breathe. Patient says he is using his inhaler more often now.    UPPER RESPIRATORY TRACT INFECTION Duration: About 10 days Worst symptom: cough Fever: no Cough: yes Shortness of breath: no Wheezing: no Chest pain: yes Chest tightness: yes Chest congestion: yes Nasal congestion: yes Runny nose: yes Post nasal drip: yes Sneezing: no Sore throat: yes Swollen glands: no Sinus pressure: yes Headache: yes Face pain: no Toothache: no Ear pain: no  Ear pressure: no  Eyes red/itching:no Eye drainage/crusting: no  Vomiting: no Rash: no Fatigue: yes Sick contacts: yes Strep contacts: no  Context: worse Recurrent sinusitis: no Relief with OTC cold/cough medications: no  Treatments attempted:  steroids and cold/sinus    Relevant past medical, surgical, family and social history reviewed and updated as indicated. Interim medical history since our last visit reviewed. Allergies and medications reviewed and updated.  Review of Systems  Constitutional: Negative.   HENT:  Positive for congestion, postnasal drip and sore throat. Negative for dental problem, drooling, ear discharge, ear pain, facial swelling, hearing loss, mouth sores, nosebleeds, rhinorrhea, sinus pressure, sinus pain, sneezing, tinnitus, trouble swallowing and voice change.   Respiratory:  Positive for cough, shortness of  breath and wheezing. Negative for apnea, choking, chest tightness and stridor.   Cardiovascular: Negative.   Gastrointestinal: Negative.   Musculoskeletal: Negative.   Psychiatric/Behavioral: Negative.      Per HPI unless specifically indicated above     Objective:    BP 122/71   Pulse 97   Temp 98.6 F (37 C) (Oral)   Ht 5\' 11"  (1.803 m)   Wt 244 lb 9.6 oz (110.9 kg)   SpO2 97%   BMI 34.11 kg/m   Wt Readings from Last 3 Encounters:  05/18/22 244 lb 9.6 oz (110.9 kg)  05/12/22 245 lb (111.1 kg)  05/12/22 240 lb (108.9 kg)    Physical Exam Vitals and nursing note reviewed.  Constitutional:      General: He is not in acute distress.    Appearance: Normal appearance. He is well-developed. He is obese. He is not ill-appearing, toxic-appearing or diaphoretic.  HENT:     Head: Normocephalic and atraumatic.     Right Ear: Tympanic membrane, ear canal and external ear normal.     Left Ear: Tympanic membrane, ear canal and external ear normal.     Nose: Congestion and rhinorrhea present.     Mouth/Throat:     Mouth: Mucous membranes are moist.     Pharynx: Oropharynx is clear.  Eyes:     General: No scleral icterus.       Right eye: No discharge.  Left eye: No discharge.     Extraocular Movements: Extraocular movements intact.     Conjunctiva/sclera: Conjunctivae normal.     Pupils: Pupils are equal, round, and reactive to light.  Cardiovascular:     Rate and Rhythm: Normal rate and regular rhythm.     Pulses: Normal pulses.     Heart sounds: Normal heart sounds. No murmur heard.    No friction rub. No gallop.  Pulmonary:     Effort: Pulmonary effort is normal. No respiratory distress.     Breath sounds: Normal breath sounds. No stridor. No wheezing, rhonchi or rales.  Chest:     Chest wall: No tenderness.  Musculoskeletal:        General: Normal range of motion.     Cervical back: Normal range of motion and neck supple.  Skin:    General: Skin is warm and  dry.     Capillary Refill: Capillary refill takes less than 2 seconds.     Coloration: Skin is not jaundiced or pale.     Findings: No bruising, erythema, lesion or rash.  Neurological:     General: No focal deficit present.     Mental Status: He is alert and oriented to person, place, and time. Mental status is at baseline.  Psychiatric:        Mood and Affect: Mood normal.        Behavior: Behavior normal.        Thought Content: Thought content normal.        Judgment: Judgment normal.     Results for orders placed or performed in visit on 05/12/22  Rapid Strep Screen ([facility] ONLY)   Specimen: Other   Other  Result Value Ref Range   Strep Gp A Ag, IA W/Reflex Negative Negative  Novel Coronavirus, NAA ([facility])   Specimen: Nasopharyngeal(NP) swabs in vial transport medium  Result Value Ref Range   SARS-CoV-2, NAA Not Detected Not Detected  Culture, Group A Strep   Other  Result Value Ref Range   Strep A Culture Negative       Assessment & Plan:   Problem List Items Addressed This Visit   None Visit Diagnoses     Acute non-recurrent maxillary sinusitis    -  Primary   Will treat with augmentin. Call if not getting better or getting worse call with any concerns.   Relevant Medications   amoxicillin-clavulanate (AUGMENTIN) 875-125 MG tablet        Follow up plan: Return if symptoms worsen or fail to improve.

## 2022-05-20 ENCOUNTER — Other Ambulatory Visit: Payer: Self-pay

## 2022-05-22 ENCOUNTER — Other Ambulatory Visit: Payer: Self-pay

## 2022-05-22 ENCOUNTER — Other Ambulatory Visit: Payer: Self-pay | Admitting: Family Medicine

## 2022-05-23 ENCOUNTER — Other Ambulatory Visit: Payer: Self-pay

## 2022-05-23 MED FILL — Omega-3-acid Ethyl Esters Cap 1 GM: ORAL | 90 days supply | Qty: 90 | Fill #0 | Status: AC

## 2022-05-23 NOTE — Telephone Encounter (Signed)
Requested Prescriptions  Pending Prescriptions Disp Refills   omega-3 acid ethyl esters (LOVAZA) 1 g capsule 90 capsule 1    Sig: Take 1 capsule (1 g total) by mouth daily.     Endocrinology:  Nutritional Agents - omega-3 acid ethyl esters Failed - 05/22/2022  8:59 PM      Failed - Lipid Panel in normal range within the last 12 months    Cholesterol, Total  Date Value Ref Range Status  03/18/2022 215 (H) 100 - 199 mg/dL Final   Cholesterol [place], [place]  Date Value Ref Range Status  06/22/2015 WILL FOLLOW  Preliminary   LDL Chol Calc (NIH)  Date Value Ref Range Status  03/18/2022 Comment (A) 0 - 99 mg/dL Final    Comment:    Triglyceride result indicated is too high for an accurate LDL cholesterol estimation.    HDL  Date Value Ref Range Status  03/18/2022 30 (L) >39 mg/dL Final   Triglycerides  Date Value Ref Range Status  03/18/2022 869 (HH) 0 - 149 mg/dL Final   Triglycerides Piccolo,Waived  Date Value Ref Range Status  06/22/2015 WILL FOLLOW  Preliminary         Passed - Valid encounter within last 12 months    Recent Outpatient Visits           5 days ago Acute non-recurrent maxillary sinusitis   [facility] Steele, Megan P, DO   1 week ago Sore throat   [facility] Avery, Hurley, DO   2 months ago Welcome to [employer] preventive visit   [facility] [place], Megan P, DO   5 months ago Type 2 diabetes mellitus with hyperglycemia, without long-term current use of insulin (HCC)   [facility] [place], Megan P, DO   8 months ago Routine general medical examination at a health care facility   [facility] Dorcas Carrow, DO       Future Appointments             In 1 month Laural Benes, Oralia Rud, DO  [facility]

## 2022-05-25 ENCOUNTER — Other Ambulatory Visit: Payer: Self-pay

## 2022-05-26 DIAGNOSIS — E538 Deficiency of other specified B group vitamins: Secondary | ICD-10-CM | POA: Diagnosis not present

## 2022-05-26 DIAGNOSIS — G3184 Mild cognitive impairment, so stated: Secondary | ICD-10-CM | POA: Diagnosis not present

## 2022-05-31 ENCOUNTER — Other Ambulatory Visit: Payer: Self-pay | Admitting: Neurology

## 2022-05-31 DIAGNOSIS — G3184 Mild cognitive impairment, so stated: Secondary | ICD-10-CM

## 2022-06-03 ENCOUNTER — Ambulatory Visit: Payer: No Typology Code available for payment source | Admitting: Nurse Practitioner

## 2022-06-09 ENCOUNTER — Ambulatory Visit: Payer: No Typology Code available for payment source | Admitting: Nurse Practitioner

## 2022-06-09 DIAGNOSIS — E119 Type 2 diabetes mellitus without complications: Secondary | ICD-10-CM

## 2022-06-09 DIAGNOSIS — I10 Essential (primary) hypertension: Secondary | ICD-10-CM

## 2022-06-09 DIAGNOSIS — Z7984 Long term (current) use of oral hypoglycemic drugs: Secondary | ICD-10-CM

## 2022-06-09 DIAGNOSIS — E559 Vitamin D deficiency, unspecified: Secondary | ICD-10-CM

## 2022-06-09 DIAGNOSIS — E782 Mixed hyperlipidemia: Secondary | ICD-10-CM

## 2022-06-09 DIAGNOSIS — R7989 Other specified abnormal findings of blood chemistry: Secondary | ICD-10-CM

## 2022-06-13 ENCOUNTER — Other Ambulatory Visit: Payer: Self-pay

## 2022-06-17 ENCOUNTER — Other Ambulatory Visit: Payer: Self-pay

## 2022-06-20 ENCOUNTER — Other Ambulatory Visit: Payer: Self-pay

## 2022-06-21 ENCOUNTER — Encounter: Payer: Self-pay | Admitting: Pharmacist

## 2022-06-21 NOTE — Progress Notes (Signed)
[facility]) [facility] Quality Pharmacy Team Statin Quality Measure Assessment  06/21/2022  Tony Cardenas [birth year] [id number]  Per review of chart and payor information, patient has a diagnosis of diabetes but is not currently filling a statin prescription.  This places patient into the Statin Use In Patients with Diabetes (SUPD) measure for CMS.    Documented intolerance with atorvastatin due to muscle pain. Last filled pravastatin 10 mg in 2023.   The 10-year ASCVD risk score (Arnett DK, et al., 2019) is: 16.7%   Values used to calculate the score:     Age: 52 years     Sex: Male     Is Non-Hispanic African American: No     Diabetic: Yes     Tobacco smoker: No     Systolic Blood Pressure: 124 mmHg     Is BP treated: Yes     HDL Cholesterol: 30 mg/dL     Total Cholesterol: 215 mg/dL 0/03/4740     Component Value Date/Time   CHOL 215 (H) 03/18/2022 1040   CHOL WILL FOLLOW 06/22/2015 1408   TRIG 869 (HH) 03/18/2022 1040   TRIG WILL FOLLOW 06/22/2015 1408   HDL 30 (L) 03/18/2022 1040   VLDL WILL FOLLOW 06/22/2015 1408   LDLCALC Comment (A) 03/18/2022 1040    Please consider ONE of the following recommendations:  Initiate high intensity statin Atorvastatin 40 mg once daily, #90, 3 refills   Rosuvastatin 20 mg once daily, #90, 3 refills    Initiate moderate intensity          statin with reduced frequency if prior          statin intolerance 1x weekly, #13, 3 refills   2x weekly, #26, 3 refills   3x weekly, #39, 3 refills    Code for past statin intolerance or  other exclusions (required annually)  Provider Requirements: Associate code during an office visit or telehealth encounter  Drug Induced Myopathy G72.0   Myopathy, unspecified G72.9   Myositis, unspecified M60.9   Rhabdomyolysis M62.82   Cirrhosis of liver K74.69   Prediabetes R73.03   PCOS E28.2   Thank you for allowing [facility] pharmacy to be a part of this patient's care.   Reynold Bowen, PharmD [facility]  [facility] Clinical Pharmacist Direct Dial: [phone]

## 2022-06-22 ENCOUNTER — Ambulatory Visit (INDEPENDENT_AMBULATORY_CARE_PROVIDER_SITE_OTHER): Payer: No Typology Code available for payment source | Admitting: Family Medicine

## 2022-06-22 ENCOUNTER — Other Ambulatory Visit: Payer: Self-pay

## 2022-06-22 ENCOUNTER — Encounter: Payer: Self-pay | Admitting: Family Medicine

## 2022-06-22 VITALS — BP 112/72 | HR 74 | Temp 98.2°F | Ht 71.0 in | Wt 246.6 lb

## 2022-06-22 DIAGNOSIS — E1165 Type 2 diabetes mellitus with hyperglycemia: Secondary | ICD-10-CM

## 2022-06-22 DIAGNOSIS — Z7984 Long term (current) use of oral hypoglycemic drugs: Secondary | ICD-10-CM

## 2022-06-22 LAB — BAYER DCA HB A1C WAIVED: HB A1C (BAYER DCA - WAIVED): 9.4 % — ABNORMAL HIGH (ref 4.8–5.6)

## 2022-06-22 MED ORDER — RYBELSUS 7 MG PO TABS
7.0000 mg | ORAL_TABLET | Freq: Every day | ORAL | 3 refills | Status: DC
  Filled 2022-06-22: qty 30, 30d supply, fill #0

## 2022-06-22 MED ORDER — RYBELSUS 3 MG PO TABS: 3.0000 mg | ORAL_TABLET | Freq: Every day | ORAL | 0 refills | Status: DC

## 2022-06-22 NOTE — Assessment & Plan Note (Signed)
Not under good control with A1c of 9.4. Will start on rybelsus and recheck 3 months. Call with any concerns. Continue to monitor.

## 2022-06-22 NOTE — Progress Notes (Signed)
BP 112/72   Pulse 74   Temp 98.2 F (36.8 C) (Oral)   Ht 5\' 11"  (1.803 m)   Wt 246 lb 9.6 oz (111.9 kg)   SpO2 96%   BMI 34.39 kg/m    Subjective:    Patient ID: Tony Cardenas, male    DOB: 02-Apr-1970, 52 y.o.   MRN: [id number]  HPI: Tony Cardenas is a 52 y.o. male  Chief Complaint  Patient presents with   Diabetes    Patient most recent Diabetic Eye Exam was requested at today's visit.   DIABETES Hypoglycemic episodes:no Polydipsia/polyuria: no Visual disturbance: no Chest pain: no Paresthesias: no Glucose Monitoring: yes  Accucheck frequency: occasionally Taking Insulin?: no Blood Pressure Monitoring: not checking Retinal Examination: Up to Date Foot Exam: Up to Date Diabetic Education: Completed Pneumovax: Up to Date Influenza: Up to Date Aspirin: no  Relevant past medical, surgical, family and social history reviewed and updated as indicated. Interim medical history since our last visit reviewed. Allergies and medications reviewed and updated.  Review of Systems  Constitutional: Negative.   Respiratory: Negative.    Cardiovascular: Negative.   Gastrointestinal: Negative.   Musculoskeletal: Negative.   Psychiatric/Behavioral: Negative.      Per HPI unless specifically indicated above     Objective:    BP 112/72   Pulse 74   Temp 98.2 F (36.8 C) (Oral)   Ht 5\' 11"  (1.803 m)   Wt 246 lb 9.6 oz (111.9 kg)   SpO2 96%   BMI 34.39 kg/m   Wt Readings from Last 3 Encounters:  06/22/22 246 lb 9.6 oz (111.9 kg)  05/18/22 244 lb 9.6 oz (110.9 kg)  05/12/22 245 lb (111.1 kg)    Physical Exam Vitals and nursing note reviewed.  Constitutional:      General: He is not in acute distress.    Appearance: Normal appearance. He is not ill-appearing, toxic-appearing or diaphoretic.  HENT:     Head: Normocephalic and atraumatic.     Right Ear: External ear normal.     Left Ear: External ear normal.     Nose: Nose normal.     Mouth/Throat:      Mouth: Mucous membranes are moist.     Pharynx: Oropharynx is clear.  Eyes:     General: No scleral icterus.       Right eye: No discharge.        Left eye: No discharge.     Extraocular Movements: Extraocular movements intact.     Conjunctiva/sclera: Conjunctivae normal.     Pupils: Pupils are equal, round, and reactive to light.  Cardiovascular:     Rate and Rhythm: Normal rate and regular rhythm.     Pulses: Normal pulses.     Heart sounds: Normal heart sounds. No murmur heard.    No friction rub. No gallop.  Pulmonary:     Effort: Pulmonary effort is normal. No respiratory distress.     Breath sounds: Normal breath sounds. No stridor. No wheezing, rhonchi or rales.  Chest:     Chest wall: No tenderness.  Musculoskeletal:        General: Normal range of motion.     Cervical back: Normal range of motion and neck supple.  Skin:    General: Skin is warm and dry.     Capillary Refill: Capillary refill takes less than 2 seconds.     Coloration: Skin is not jaundiced or pale.     Findings: No bruising,  erythema, lesion or rash.  Neurological:     General: No focal deficit present.     Mental Status: He is alert and oriented to person, place, and time. Mental status is at baseline.  Psychiatric:        Mood and Affect: Mood normal.        Behavior: Behavior normal.        Thought Content: Thought content normal.        Judgment: Judgment normal.     Results for orders placed or performed in visit on 05/12/22  Rapid Strep Screen ([facility] ONLY)   Specimen: Other   Other  Result Value Ref Range   Strep Gp A Ag, IA W/Reflex Negative Negative  Novel Coronavirus, NAA ([facility])   Specimen: Nasopharyngeal(NP) swabs in vial transport medium  Result Value Ref Range   SARS-CoV-2, NAA Not Detected Not Detected  Culture, Group A Strep   Other  Result Value Ref Range   Strep A Culture Negative       Assessment & Plan:   Problem List Items Addressed This Visit        Endocrine   Type 2 diabetes mellitus with hyperglycemia (HCC) - Primary    Not under good control with A1c of 9.4. Will start on rybelsus and recheck 3 months. Call with any concerns. Continue to monitor.       Relevant Medications   Semaglutide (RYBELSUS) 3 MG TABS   Semaglutide (RYBELSUS) 7 MG TABS (Start on 07/22/2022)   Other Relevant Orders   Bayer DCA Hb A1c Waived     Follow up plan: Return in about 3 months (around 09/22/2022) for physical.

## 2022-06-26 ENCOUNTER — Encounter: Payer: Self-pay | Admitting: Nurse Practitioner

## 2022-06-29 ENCOUNTER — Ambulatory Visit (INDEPENDENT_AMBULATORY_CARE_PROVIDER_SITE_OTHER): Payer: No Typology Code available for payment source | Admitting: Nurse Practitioner

## 2022-06-29 ENCOUNTER — Other Ambulatory Visit: Payer: Self-pay

## 2022-06-29 ENCOUNTER — Encounter: Payer: Self-pay | Admitting: Nurse Practitioner

## 2022-06-29 VITALS — BP 128/72 | HR 81 | Ht 71.0 in | Wt 245.2 lb

## 2022-06-29 DIAGNOSIS — I10 Essential (primary) hypertension: Secondary | ICD-10-CM

## 2022-06-29 DIAGNOSIS — E782 Mixed hyperlipidemia: Secondary | ICD-10-CM | POA: Diagnosis not present

## 2022-06-29 DIAGNOSIS — E119 Type 2 diabetes mellitus without complications: Secondary | ICD-10-CM | POA: Diagnosis not present

## 2022-06-29 DIAGNOSIS — Z7984 Long term (current) use of oral hypoglycemic drugs: Secondary | ICD-10-CM | POA: Diagnosis not present

## 2022-06-29 DIAGNOSIS — E559 Vitamin D deficiency, unspecified: Secondary | ICD-10-CM
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
## 2022-07-13 ENCOUNTER — Other Ambulatory Visit: Payer: Self-pay

## 2022-07-13 ENCOUNTER — Other Ambulatory Visit (HOSPITAL_COMMUNITY): Payer: Self-pay

## 2022-07-13 DIAGNOSIS — M0609 Rheumatoid arthritis without rheumatoid factor, multiple sites: Secondary | ICD-10-CM | POA: Diagnosis not present

## 2022-07-13 DIAGNOSIS — Z79899 Other long term (current) drug therapy: Secondary | ICD-10-CM | POA: Diagnosis not present

## 2022-07-13 DIAGNOSIS — M069 Rheumatoid arthritis, unspecified: Secondary | ICD-10-CM | POA: Diagnosis not present

## 2022-07-13 MED ORDER — METHOTREXATE SODIUM 2.5 MG PO TABS
12.5000 mg | ORAL_TABLET | ORAL | 1 refills | Status: DC
  Filled 2022-07-13 (×2): qty 60, 84d supply, fill #0

## 2022-07-13 MED ORDER — PREDNISONE 10 MG PO TABS
ORAL_TABLET | ORAL | 0 refills | Status: AC
  Filled 2022-07-13 (×2): qty 30, 12d supply, fill #0

## 2022-07-15 ENCOUNTER — Other Ambulatory Visit: Payer: Self-pay | Admitting: Family Medicine

## 2022-07-15 NOTE — Telephone Encounter (Signed)
Medication Refill - Medication:  Rx #: [id number]  lisinopril (ZESTRIL) 5 MG tablet [[id number]]  Has the patient contacted their pharmacy? Yes.   (Agent: If no, request that the patient contact the pharmacy for the refill. If patient does not wish to contact the pharmacy document the reason why and proceed with request.) (Agent: If yes, when and what did the pharmacy advise?)  Preferred Pharmacy (with phone number or street name):  [facility] Phone: [phone]  Fax: [phone]     Has the patient been seen for an appointment in the last year OR does the patient have an upcoming appointment? Yes.    Agent: Please be advised that RX refills may take up to 3 business days. We ask that you follow-up with your pharmacy.

## 2022-07-18 ENCOUNTER — Other Ambulatory Visit: Payer: Self-pay

## 2022-07-18 MED ORDER — LISINOPRIL 5 MG PO TABS
ORAL_TABLET | Freq: Every day | ORAL | 1 refills | Status: DC
  Filled 2022-07-18: qty 90, 90d supply, fill #0

## 2022-07-18 NOTE — Telephone Encounter (Signed)
Pt last seen 6/12 and has follow up 9/12

## 2022-07-20 ENCOUNTER — Other Ambulatory Visit: Payer: Self-pay | Admitting: Nurse Practitioner

## 2022-07-20 ENCOUNTER — Other Ambulatory Visit: Payer: Self-pay

## 2022-07-20 ENCOUNTER — Ambulatory Visit (INDEPENDENT_AMBULATORY_CARE_PROVIDER_SITE_OTHER): Payer: No Typology Code available for payment source | Admitting: Nurse Practitioner

## 2022-07-20 ENCOUNTER — Encounter: Payer: Self-pay | Admitting: Nurse Practitioner

## 2022-07-20 VITALS — BP 120/74 | HR 80 | Ht 71.0 in | Wt 246.4 lb

## 2022-07-20 DIAGNOSIS — Z7984 Long term (current) use of oral hypoglycemic drugs: Secondary | ICD-10-CM | POA: Diagnosis not present

## 2022-07-20 DIAGNOSIS — E119 Type 2 diabetes mellitus without complications: Secondary | ICD-10-CM
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
## 2022-07-20 NOTE — Patient Instructions (Signed)
Inject 10 units of insulin under the skin daily before bed.  Reach out to me in 1 week if glucose in the morning is over 175 consistently.

## 2022-07-21 ENCOUNTER — Other Ambulatory Visit: Payer: Self-pay

## 2022-07-21 ENCOUNTER — Other Ambulatory Visit: Payer: Self-pay | Admitting: Nurse Practitioner

## 2022-07-21 MED FILL — Lancets: 50 days supply | Qty: 100 | Fill #0 | Status: AC

## 2022-07-22 ENCOUNTER — Other Ambulatory Visit: Payer: Self-pay | Admitting: Nurse Practitioner

## 2022-07-22 ENCOUNTER — Other Ambulatory Visit (HOSPITAL_COMMUNITY): Payer: Self-pay

## 2022-07-24 ENCOUNTER — Other Ambulatory Visit: Payer: Self-pay | Admitting: Nurse Practitioner

## 2022-07-24 ENCOUNTER — Other Ambulatory Visit: Payer: Self-pay

## 2022-07-25 ENCOUNTER — Other Ambulatory Visit: Payer: Self-pay

## 2022-07-25 MED FILL — Glucose Blood Test Strip: 50 days supply | Qty: 100 | Fill #0 | Status: AC

## 2022-07-26 ENCOUNTER — Other Ambulatory Visit: Payer: Self-pay

## 2022-07-27 ENCOUNTER — Other Ambulatory Visit: Payer: Self-pay

## 2022-08-05 ENCOUNTER — Other Ambulatory Visit: Payer: Self-pay

## 2022-08-05 MED ORDER — METHOTREXATE SODIUM 2.5 MG PO TABS
20.0000 mg | ORAL_TABLET | ORAL | 1 refills | Status: DC
  Filled 2022-08-05: qty 52, 42d supply, fill #0
  Filled 2022-09-05: qty 96, 84d supply, fill #0
  Filled 2023-02-06: qty 96, 84d supply, fill #1

## 2022-08-05 MED ORDER — PREDNISONE 5 MG PO TABS
ORAL_TABLET | ORAL | 0 refills | Status: AC
  Filled 2022-08-05: qty 70, 28d supply, fill #0

## 2022-08-07 ENCOUNTER — Encounter: Payer: Self-pay | Admitting: Nurse Practitioner

## 2022-08-08 ENCOUNTER — Other Ambulatory Visit: Payer: Self-pay

## 2022-08-10 ENCOUNTER — Other Ambulatory Visit: Payer: Self-pay

## 2022-08-11 ENCOUNTER — Other Ambulatory Visit: Payer: Self-pay

## 2022-08-11 ENCOUNTER — Telehealth: Payer: Self-pay | Admitting: Nurse Practitioner

## 2022-08-11 ENCOUNTER — Encounter: Payer: Self-pay | Admitting: Student in an Organized Health Care Education/Training Program

## 2022-08-11 ENCOUNTER — Ambulatory Visit
Payer: No Typology Code available for payment source | Attending: Student in an Organized Health Care Education/Training Program | Admitting: Student in an Organized Health Care Education/Training Program

## 2022-08-11 VITALS — BP 131/80 | HR 76 | Temp 97.4°F | Resp 16 | Ht 72.0 in | Wt 245.0 lb

## 2022-08-11 DIAGNOSIS — M5412 Radiculopathy, cervical region: Secondary | ICD-10-CM

## 2022-08-11 DIAGNOSIS — Z981 Arthrodesis status: Secondary | ICD-10-CM

## 2022-08-11 DIAGNOSIS — G894 Chronic pain syndrome: Secondary | ICD-10-CM | POA: Diagnosis not present

## 2022-08-11 DIAGNOSIS — M542 Cervicalgia: Secondary | ICD-10-CM

## 2022-08-11 DIAGNOSIS — Q761 Klippel-Feil syndrome: Secondary | ICD-10-CM

## 2022-08-11 MED ORDER — HYDROCODONE-ACETAMINOPHEN 7.5-325 MG PO TABS
1.0000 | ORAL_TABLET | Freq: Four times a day (QID) | ORAL | 0 refills | Status: DC | PRN
Start: 2022-10-18 — End: 2022-11-17
  Filled 2022-10-18: qty 120, 30d supply, fill #0

## 2022-08-11 MED ORDER — HYDROCODONE-ACETAMINOPHEN 7.5-325 MG PO TABS
1.0000 | ORAL_TABLET | Freq: Four times a day (QID) | ORAL | 0 refills | Status: AC | PRN
Start: 2022-08-19 — End: 2022-09-18
  Filled 2022-08-19 (×2): qty 120, 30d supply, fill #0

## 2022-08-11 MED ORDER — HYDROCODONE-ACETAMINOPHEN 7.5-325 MG PO TABS
1.0000 | ORAL_TABLET | Freq: Four times a day (QID) | ORAL | 0 refills | Status: DC | PRN
Start: 2022-09-18 — End: 2022-10-17
  Filled 2022-09-19: qty 120, 30d supply, fill #0

## 2022-08-11 NOTE — Progress Notes (Signed)
Nursing Pain Medication Assessment:  Safety precautions to be maintained throughout the outpatient stay will include: orient to surroundings, keep bed in low position, maintain call bell within reach at all times, provide assistance with transfer out of bed and ambulation.  Medication Inspection Compliance: Pill count conducted under aseptic conditions, in front of the patient. Neither the pills nor the bottle was removed from the patient's sight at any time. Once count was completed pills were immediately returned to the patient in their original bottle.  Medication: Hydrocodone/APAP Pill/Patch Count:  35 of 120 pills remain Pill/Patch Appearance: Markings consistent with prescribed medication Bottle Appearance: Standard pharmacy container. Clearly labeled. Filled Date: 07 / 10 / 2024 Last Medication intake:  Yesterday

## 2022-08-11 NOTE — Telephone Encounter (Signed)
Pt wanted me to let you know his sugar was 137 today and 147 yesterday so its working.

## 2022-08-11 NOTE — Progress Notes (Signed)
PROVIDER NOTE: Information contained herein reflects review and annotations entered in association with encounter. Interpretation of such information and data should be left to medically-trained personnel. Information provided to patient can be located elsewhere in the medical record under "Patient Instructions". Document created using STT-dictation technology, any transcriptional errors that may result from process are unintentional.    Patient: Tony Cardenas  Service Category: E/M  Provider: Edward Jolly, MD  DOB: 22-Mar-1970  DOS: 08/18/2020  Specialty: Interventional Pain Management  MRN: [id number]  Setting: Ambulatory outpatient  PCP: Dorcas Carrow, DO  Type: Established Patient    Referring Provider: Dorcas Carrow, DO  Location: Office  Delivery: Face-to-face     HPI  [name], a 52 y.o. year old male, is here today because of his Chronic pain syndrome [G89.4]. [name] primary complain today is Neck Pain (Bilateral )  Last encounter: My last encounter with him was on 01/27/22 Pertinent problems: [name] has S/P cervical spinal fusion; Cervical radicular pain; Cervical fusion syndrome; Chronic pain syndrome; and Cervical spondylosis with myelopathy and radiculopathy on their pertinent problem list. Pain Assessment: Severity of Chronic pain is reported as a 5 /10. Location: Neck Left, Right/into both shoulders and down arms into hands. Onset: More than a month ago. Quality: Discomfort, Constant, Dull, Nagging. Timing: Constant. Modifying factor(s): laying flat, medications, patient currently on steroid taper d/t a flare of RA. Vitals:  height is 6' (1.829 m) and weight is 245 lb (111.1 kg). His temporal temperature is 97.4 F (36.3 C) (abnormal). His blood pressure is 131/80 and his pulse is 76. His respiration is 16 and oxygen saturation is 100%.   Reason for encounter: medication management.    Experiencing RA flare, currently on steroid  therapy Continues to endorse neck pain (chronic), well controlled on current regimen States that he has been walking approx 1 mile, 3x/week Has been watching his diet and has reduced bread intake and soft drinks   Pharmacotherapy Assessment  Analgesic: Hydrocodone 7.5 mg QID PRN   Monitoring: [place] PMP: PDMP reviewed during this encounter.       Pharmacotherapy: No side-effects or adverse reactions reported. Compliance: No problems identified. Effectiveness: Clinically acceptable.  UDS:  Summary  Date Value Ref Range Status  01/27/2022 Note  Final    Comment:    ==================================================================== [employer] Select 13 (MW) ==================================================================== Test                             Result       Flag       Units  Drug Present and Declared for Prescription Verification   Hydrocodone                    2440         EXPECTED   ng/mg creat   Hydromorphone                  1400         EXPECTED   ng/mg creat   Dihydrocodeine                 320          EXPECTED   ng/mg creat   Norhydrocodone                 1114         EXPECTED   ng/mg creat    Sources  of hydrocodone include scheduled prescription medications.    Hydromorphone, dihydrocodeine and norhydrocodone are expected    metabolites of hydrocodone. Hydromorphone and dihydrocodeine are    also available as scheduled prescription medications.  ==================================================================== Test                      Result    Flag   Units      Ref Range   Creatinine              50               mg/dL      >=16 ==================================================================== Declared Medications:  The flagging and interpretation on this report are based on the  following declared medications.  Unexpected results may arise from  inaccuracies in the declared medications.   **Note: The testing scope of this panel includes these  medications:   Hydrocodone (Norco)   **Note: The testing scope of this panel does not include the  following reported medications:   Acetaminophen (Norco)  Albuterol (Ventolin HFA)  Evolocumab (Repatha)  Ezetimibe (Zetia)  Fluticasone (Flonase)  Folic Acid  Lisinopril (Zestril)  Metformin (Glucophage)  Methocarbamol (Robaxin)  Methotrexate  Omega-3 Fatty Acids  Pravastatin (Pravachol)  Vitamin D2 (Drisdol) ==================================================================== For clinical consultation, please call [phone]. ====================================================================        ROS  Constitutional: Denies any fever or chills Gastrointestinal: No reported hemesis, hematochezia, vomiting, or acute GI distress Musculoskeletal:  mild cervical and trapezius pain Neurological: No reported episodes of acute onset apraxia, aphasia, dysarthria, agnosia, amnesia, paralysis, loss of coordination, or loss of consciousness  Medication Review  FreeStyle Freedom Lite, HYDROcodone-acetaminophen, Vitamin D (Ergocalciferol), albuterol, fluticasone, folic acid, freestyle, glipiZIDE, glucose blood, metFORMIN, methocarbamol, methotrexate, omega-3 acid ethyl esters, pravastatin, and predniSONE  History Review  Allergy: [name] is allergic to atorvastatin, hydromorphone hcl, and semaglutide. Drug: [name]  reports no history of drug use. Alcohol:  reports no history of alcohol use. Tobacco:  reports that he quit smoking about 3 years ago. His smoking use included cigarettes. He started smoking about 23 years ago. He has a 10 pack-year smoking history. He quit smokeless tobacco use about 6 years ago.  His smokeless tobacco use included chew. Social: [name]  reports that he quit smoking about 3 years ago. His smoking use included cigarettes. He started smoking about 23 years ago. He has a 10 pack-year smoking history. He quit smokeless tobacco use about 6  years ago.  His smokeless tobacco use included chew. He reports that he does not drink alcohol and does not use drugs. Medical:  has a past medical history of Arthritis, COPD (chronic obstructive pulmonary disease) (HCC), Diabetes mellitus without complication (HCC), GERD (gastroesophageal reflux disease), Hyperlipidemia, Hypertension, and Neck pain (1989). Surgical: Mr. Enterline  has a past surgical history that includes Neck surgery (1989); Knee surgery (Right); Colonoscopy (N/A, 06/02/2014); Polypectomy (06/02/2014); Appendectomy; Esophagogastroduodenoscopy (egd) with propofol (N/A, 07/29/2016); Back surgery; Anterior cervical corpectomy (N/A, 10/10/2018); Hernia repair (over 10 years ago); Anterior cervical corpectomy (N/A, 05/01/2019); Posterior cervical fusion/foraminotomy (N/A, 05/01/2019); and Colonoscopy with propofol (N/A, 11/15/2021). Family: family history includes Alcohol abuse in his father; COPD in his father; Cancer in his mother; Diabetes in his brother, father, and paternal grandmother.  Laboratory Chemistry Profile   Renal Lab Results  Component Value Date   BUN 9 03/18/2022   CREATININE 0.70 (L) 03/18/2022   BCR 13 03/18/2022   GFRAA 114 12/16/2019   GFRNONAA >60  11/11/2021     Hepatic Lab Results  Component Value Date   AST 19 03/18/2022   ALT 26 03/18/2022   ALBUMIN 4.8 03/18/2022   ALKPHOS 64 03/18/2022   HCVAB NON REACTIVE 03/24/2020     Electrolytes Lab Results  Component Value Date   NA 138 03/18/2022   K 4.4 03/18/2022   CL 101 03/18/2022   CALCIUM 9.2 03/18/2022     Bone Lab Results  Component Value Date   VD25OH 23.1 (L) 09/02/2021     Inflammation (CRP: Acute Phase) (ESR: Chronic Phase) Lab Results  Component Value Date   CRP 0.7 05/26/2020   ESRSEDRATE 31 (H) 11/11/2021       Note: Above Lab results reviewed.  Recent Imaging Review  US THYROID CLINICAL DATA:  Goiter.  Neck compressive symptoms.  EXAM: THYROID  ULTRASOUND  TECHNIQUE: Ultrasound examination of the thyroid gland and adjacent soft tissues was performed.  COMPARISON:  06/16/2017  FINDINGS: Parenchymal Echotexture: Mildly heterogenous  Isthmus: 0.7 cm  Right lobe: 6.7 x 2.9 x 3.4 cm  Left lobe: 6.7 x 2.9 x 2.6 cm  _________________________________________________________  Estimated total number of nodules >/= 1 cm: 0  Number of spongiform nodules >/=  2 cm not described below (TR1): 0  Number of mixed cystic and solid nodules >/= 1.5 cm not described below (TR2): 0  _________________________________________________________  No discrete nodules are seen within the thyroid gland. No enlarged or abnormal appearing lymph nodes are identified.  IMPRESSION: Enlarged and mildly heterogeneous thyroid gland without discrete nodules. Overall volume of the thyroid gland is similar to the 2019 study.  The above is in keeping with the ACR TI-RADS recommendations - J Am Coll Radiol 2017;14:587-595.  Electronically Signed   By: Irish Lack M.D.   On: 12/13/2021 15:02  Note: Reviewed        Physical Exam  General appearance: Well nourished, well developed, and well hydrated. In no apparent acute distress Mental status: Alert, oriented x 3 (person, place, & time)       Respiratory: No evidence of acute respiratory distress Eyes: PERLA Vitals: BP 131/80 (BP Location: Right Arm, Patient Position: Sitting, Cuff Size: Normal)   Pulse 76   Temp (!) 97.4 F (36.3 C) (Temporal)   Resp 16   Ht 6' (1.829 m)   Wt 245 lb (111.1 kg)   SpO2 100%   BMI 33.23 kg/m  BMI: Estimated body mass index is 33.23 kg/m as calculated from the following:   Height as of this encounter: 6' (1.829 m).   Weight as of this encounter: 245 lb (111.1 kg). Ideal: Ideal body weight: 77.6 kg (171 lb 1.2 oz) Adjusted ideal body weight: 91 kg (200 lb 10.3 oz)  Limited range of motion with cervical extension.  Tenderness to palpation along  trapezius and deltoid. Neuropathic pain pattern of upper extremity, persistent Neuropathic pain of bilateral lower extremity   Assessment   Status Diagnosis  Controlled Controlled Controlled 1. Chronic pain syndrome   2. Cervical fusion syndrome   3. Cervical radicular pain   4. Cervicalgia   5. S/P cervical spinal fusion           Plan of Care   Mr. TYION GHOBRIAL has a current medication list which includes the following long-term medication(s): fluticasone, glipizide, metformin, omega-3 acid ethyl esters, pravastatin, and albuterol.  Requested Prescriptions   Signed Prescriptions Disp Refills   HYDROcodone-acetaminophen (NORCO) 7.5-325 MG tablet 120 tablet 0    Sig: Take  1 tablet by mouth every 6 (six) hours as needed for moderate pain.   HYDROcodone-acetaminophen (NORCO) 7.5-325 MG tablet 120 tablet 0    Sig: Take 1 tablet by mouth every 6 (six) hours as needed for moderate pain.   HYDROcodone-acetaminophen (NORCO) 7.5-325 MG tablet 120 tablet 0    Sig: Take 1 tablet by mouth every 6 (six) hours as needed for moderate pain.   Continue Robaxin prn Continue with blood glucose management  Discussed Qutenza, capsaicin 8% treatment for painful diabetic neuropathy of lower extremity.  Follow-up plan:   Return in about 3 months (around 11/15/2022) for Medication Management, in person.   Recent Visits No visits were found meeting these conditions. Showing recent visits within past 90 days and meeting all other requirements Today's Visits Date Type Provider Dept  08/11/22 Office Visit Edward Jolly, MD [facility]  Showing today's visits and meeting all other requirements Future Appointments No visits were found meeting these conditions. Showing future appointments within next 90 days and meeting all other requirements  I discussed the assessment and treatment plan with the patient. The patient was provided an opportunity to ask questions and all were  answered. The patient agreed with the plan and demonstrated an understanding of the instructions.  Patient advised to call back or seek an in-person evaluation if the symptoms or condition worsens.  Duration of encounter: .  Note by: Edward Jolly, MD Date: 08/11/2022; Time: 8:40 AM

## 2022-08-11 NOTE — Telephone Encounter (Signed)
Wonderful!  Glad to hear it, thanks for the update.

## 2022-08-16 ENCOUNTER — Other Ambulatory Visit: Payer: Self-pay

## 2022-08-18 ENCOUNTER — Other Ambulatory Visit: Payer: Self-pay

## 2022-08-18 DIAGNOSIS — H524 Presbyopia: Secondary | ICD-10-CM | POA: Diagnosis not present

## 2022-08-18 LAB — HM DIABETES EYE EXAM

## 2022-08-19 ENCOUNTER — Other Ambulatory Visit: Payer: Self-pay

## 2022-08-23 ENCOUNTER — Telehealth: Payer: Self-pay | Admitting: Family Medicine

## 2022-08-23 NOTE — Telephone Encounter (Signed)
Thank you, I returned her call.

## 2022-08-23 NOTE — Telephone Encounter (Signed)
Copied from CRM [id number]. Topic: General - Other >> Aug 23, 2022  9:14 AM Macon Large wrote: Reason for CRM: Lupita Leash with Dr. Velvet Bathe office reports she was returning call to [facility] regarding the fax that was sent on 07/22/22. Lupita Leash requests call back at [phone]

## 2022-08-27 ENCOUNTER — Other Ambulatory Visit: Payer: Self-pay | Admitting: Family Medicine

## 2022-08-27 MED FILL — Omega-3-acid Ethyl Esters Cap 1 GM: ORAL | 90 days supply | Qty: 90 | Fill #1 | Status: AC

## 2022-08-29 ENCOUNTER — Other Ambulatory Visit: Payer: Self-pay

## 2022-08-29 ENCOUNTER — Other Ambulatory Visit: Payer: Self-pay | Admitting: Family Medicine

## 2022-08-29 NOTE — Telephone Encounter (Signed)
Requested medication (s) are due for refill today: yes  Requested medication (s) are on the active medication list: yes  Last refill:  03/18/22 #12 1 RF  Future visit scheduled: yes  Notes to clinic:  med not delegated to NT to RF   Requested Prescriptions  Pending Prescriptions Disp Refills   Vitamin D, Ergocalciferol, (DRISDOL) 1.25 MG (50000 UNIT) CAPS capsule 12 capsule 1    Sig: Take 1 capsule (50,000 Units total) by mouth once a week.     Endocrinology:  Vitamins - Vitamin D Supplementation 2 Failed - 08/27/2022  2:22 PM      Failed - Manual Review: Route requests for 50,000 IU strength to the provider      Failed - Vitamin D in normal range and within 360 days    Vit D, 25-Hydroxy  Date Value Ref Range Status  09/02/2021 23.1 (L) 30.0 - 100.0 ng/mL Final    Comment:    Vitamin D deficiency has been defined by the Institute of Medicine and an Endocrine Society practice guideline as a level of serum 25-OH vitamin D less than 20 ng/mL (1,2). The Endocrine Society went on to further define vitamin D insufficiency as a level between 21 and 29 ng/mL (2). 1. IOM (Institute of Medicine). 2010. Dietary reference    intakes for calcium and D. [place]: The    [employer]. 2. Holick MF, Binkley Ponder, Bischoff-Ferrari HA, et al.    Evaluation, treatment, and prevention of vitamin D    deficiency: an Endocrine Society clinical practice    guideline. JCEM. 2011 Jul; 96(7):1911-30.          Passed - Ca in normal range and within 360 days    Calcium  Date Value Ref Range Status  03/18/2022 9.2 8.7 - 10.2 mg/dL Final   Calcium, Total  Date Value Ref Range Status  02/11/2013 8.1 (L) 8.5 - 10.1 mg/dL Final         Passed - Valid encounter within last 12 months    Recent Outpatient Visits           2 months ago Type 2 diabetes mellitus with hyperglycemia, without long-term current use of insulin (HCC)   [facility] [place], Megan P, DO    3 months ago Acute non-recurrent maxillary sinusitis   [place] [facility] [place], Megan P, DO   3 months ago Sore throat   [place] [facility] [place], Megan P, DO   5 months ago [facility] to [employer] preventive visit   [place] [facility] [place], Megan P, DO   8 months ago Type 2 diabetes mellitus with hyperglycemia, without long-term current use of insulin [facility])   [place] [facility] [place], Oralia Rud, DO       Future Appointments             In 3 weeks Laural Benes, Oralia Rud, DO [place] [facility], [facility]

## 2022-08-31 NOTE — Telephone Encounter (Signed)
Medication discontinued 08/11/22 Requested Prescriptions  Pending Prescriptions Disp Refills   ezetimibe (ZETIA) 10 MG tablet 90 tablet 0    Sig: Take 1 tablet (10 mg total) by mouth daily.     Cardiovascular:  Antilipid - Sterol Transport Inhibitors Failed - 08/29/2022  2:30 PM      Failed - Lipid Panel in normal range within the last 12 months    Cholesterol, Total  Date Value Ref Range Status  03/18/2022 215 (H) 100 - 199 mg/dL Final   Cholesterol [place], [place]  Date Value Ref Range Status  06/22/2015 WILL FOLLOW  Preliminary   LDL Chol Calc (NIH)  Date Value Ref Range Status  03/18/2022 Comment (A) 0 - 99 mg/dL Final    Comment:    Triglyceride result indicated is too high for an accurate LDL cholesterol estimation.    HDL  Date Value Ref Range Status  03/18/2022 30 (L) >39 mg/dL Final   Triglycerides  Date Value Ref Range Status  03/18/2022 869 (HH) 0 - 149 mg/dL Final   Triglycerides Piccolo,Waived  Date Value Ref Range Status  06/22/2015 WILL FOLLOW  Preliminary         Passed - AST in normal range and within 360 days    AST  Date Value Ref Range Status  03/18/2022 19 0 - 40 IU/L Final   SGOT(AST)  Date Value Ref Range Status  02/11/2013 46 (H) 15 - 37 Unit/L Final         Passed - ALT in normal range and within 360 days    ALT  Date Value Ref Range Status  03/18/2022 26 0 - 44 IU/L Final   SGPT (ALT)  Date Value Ref Range Status  02/11/2013 51 12 - 78 U/L Final         Passed - Patient is not pregnant      Passed - Valid encounter within last 12 months    Recent Outpatient Visits           2 months ago Type 2 diabetes mellitus with hyperglycemia, without long-term current use of insulin (HCC)   [place] [facility] [place], Megan P, DO   3 months ago Acute non-recurrent maxillary sinusitis   [place] [facility] [place], Megan P, DO   3 months ago Sore throat   [place] [facility] [place],  Megan P, DO   5 months ago Welcome to [employer] preventive visit   [place] [facility] [place], Megan P, DO   8 months ago Type 2 diabetes mellitus with hyperglycemia, without long-term current use of insulin [facility])   [place] [facility] [place], Oralia Rud, DO       Future Appointments             In 3 weeks Laural Benes, Oralia Rud, DO [place] [facility], [facility]

## 2022-09-02 ENCOUNTER — Other Ambulatory Visit: Payer: Self-pay

## 2022-09-02 ENCOUNTER — Other Ambulatory Visit: Payer: Self-pay | Admitting: Family Medicine

## 2022-09-02 ENCOUNTER — Telehealth: Payer: Self-pay | Admitting: Family Medicine

## 2022-09-02 NOTE — Telephone Encounter (Signed)
Medication Refill - Medication: Vitamin D, Ergocalciferol, (DRISDOL) 1.25 MG (50000 UNIT) CAPS capsule , ezetimibe (ZETIA) 10 MG tablet    Pt wife stated she needs to know why these medications were denied to be filled as pt is due for his medication. Wife is requesting a callback.  Please advise.  Has the patient contacted their pharmacy? Yes.    (Agent: If yes, when and what did the pharmacy advise?)  Preferred Pharmacy (with phone number or street name):  [facility]  [address] [place] [address]  Phone: [phone] Fax: [phone]  Hours: M-F 7:30a-6p   Has the patient been seen for an appointment in the last year OR does the patient have an upcoming appointment? Yes.    Agent: Please be advised that RX refills may take up to 3 business days. We ask that you follow-up with your pharmacy.

## 2022-09-02 NOTE — Telephone Encounter (Signed)
Per chart: ezetimibe (ZETIA) 10 MG tablet is not on current medication list- this will have to be reviewed by office  Vitamin D 1.25mg  is on the current medication list- Start Date & Time: 03/08/2024End w/ Doses: 03/18/2023 after 53 doses on Rx- so should be taking ?- not sure why not filled- high dose medication- provider review

## 2022-09-05 ENCOUNTER — Other Ambulatory Visit: Payer: Self-pay

## 2022-09-05 MED ORDER — EZETIMIBE 10 MG PO TABS
10.0000 mg | ORAL_TABLET | Freq: Every day | ORAL | 1 refills | Status: DC
  Filled 2022-09-05: qty 90, 90d supply, fill #0

## 2022-09-05 NOTE — Telephone Encounter (Signed)
Zetia refill sent to his pharmacy

## 2022-09-05 NOTE — Telephone Encounter (Signed)
Malen Gauze, CMA  You8 minutes ago (11:42 AM)    Spoke with patient and he says he had his medication incorrect and provided the wrong information when he was here the last time. Patient says he has been taking the prescription for Zetia and is requesting a refill. Patient verbalized understanding on Vitamin D recommendations.

## 2022-09-05 NOTE — Telephone Encounter (Signed)
He noted he wasn't taking the Zetia last time he was here which is why it was discontinued. Has he been taking it? We'll recheck his vitamin D when he comes back for his appointment in a couple of weeks to see if he needs a refill at this time.

## 2022-09-07 ENCOUNTER — Other Ambulatory Visit: Payer: Self-pay

## 2022-09-07 ENCOUNTER — Encounter: Payer: Self-pay | Admitting: Family Medicine

## 2022-09-08 ENCOUNTER — Encounter: Payer: Self-pay | Admitting: Student in an Organized Health Care Education/Training Program

## 2022-09-08 ENCOUNTER — Other Ambulatory Visit: Payer: Self-pay

## 2022-09-08 MED ORDER — PREDNISONE 10 MG PO TABS
ORAL_TABLET | ORAL | 0 refills | Status: DC
  Filled 2022-09-08: qty 30, 12d supply, fill #0

## 2022-09-09 ENCOUNTER — Other Ambulatory Visit: Payer: Self-pay

## 2022-09-14 ENCOUNTER — Other Ambulatory Visit: Payer: Self-pay

## 2022-09-15 ENCOUNTER — Ambulatory Visit: Payer: No Typology Code available for payment source | Admitting: Nurse Practitioner

## 2022-09-17 DIAGNOSIS — E785 Hyperlipidemia, unspecified: Secondary | ICD-10-CM | POA: Diagnosis not present

## 2022-09-17 DIAGNOSIS — D84821 Immunodeficiency due to drugs: Secondary | ICD-10-CM | POA: Diagnosis not present

## 2022-09-17 DIAGNOSIS — Z79891 Long term (current) use of opiate analgesic: Secondary | ICD-10-CM | POA: Diagnosis not present

## 2022-09-17 DIAGNOSIS — E669 Obesity, unspecified: Secondary | ICD-10-CM | POA: Diagnosis not present

## 2022-09-17 DIAGNOSIS — E1165 Type 2 diabetes mellitus with hyperglycemia: Secondary | ICD-10-CM | POA: Diagnosis not present

## 2022-09-17 DIAGNOSIS — J449 Chronic obstructive pulmonary disease, unspecified: Secondary | ICD-10-CM | POA: Diagnosis not present

## 2022-09-17 DIAGNOSIS — M069 Rheumatoid arthritis, unspecified: Secondary | ICD-10-CM | POA: Diagnosis not present

## 2022-09-17 DIAGNOSIS — E1169 Type 2 diabetes mellitus with other specified complication: Secondary | ICD-10-CM | POA: Diagnosis not present

## 2022-09-17 DIAGNOSIS — D8481 Immunodeficiency due to conditions classified elsewhere: Secondary | ICD-10-CM | POA: Diagnosis not present

## 2022-09-17 DIAGNOSIS — D692 Other nonthrombocytopenic purpura: Secondary | ICD-10-CM | POA: Diagnosis not present

## 2022-09-17 DIAGNOSIS — Z008 Encounter for other general examination: Secondary | ICD-10-CM | POA: Diagnosis not present

## 2022-09-17 DIAGNOSIS — Z6834 Body mass index (BMI) 34.0-34.9, adult: Secondary | ICD-10-CM | POA: Diagnosis not present

## 2022-09-17 DIAGNOSIS — F17211 Nicotine dependence, cigarettes, in remission: Secondary | ICD-10-CM | POA: Diagnosis not present

## 2022-09-19 ENCOUNTER — Other Ambulatory Visit: Payer: Self-pay

## 2022-09-20 ENCOUNTER — Ambulatory Visit
Payer: No Typology Code available for payment source | Admitting: Student in an Organized Health Care Education/Training Program

## 2022-09-21 ENCOUNTER — Ambulatory Visit: Payer: No Typology Code available for payment source | Admitting: Nurse Practitioner

## 2022-09-21 ENCOUNTER — Other Ambulatory Visit: Payer: Self-pay

## 2022-09-21 ENCOUNTER — Encounter: Payer: Self-pay | Admitting: Nurse Practitioner

## 2022-09-21 ENCOUNTER — Ambulatory Visit (INDEPENDENT_AMBULATORY_CARE_PROVIDER_SITE_OTHER): Payer: No Typology Code available for payment source | Admitting: Nurse Practitioner

## 2022-09-21 VITALS — BP 137/66 | HR 92 | Ht 71.0 in | Wt 248.2 lb

## 2022-09-21 DIAGNOSIS — E559 Vitamin D deficiency, unspecified: Secondary | ICD-10-CM

## 2022-09-21 DIAGNOSIS — I10 Essential (primary) hypertension: Secondary | ICD-10-CM

## 2022-09-21 DIAGNOSIS — Z7984 Long term (current) use of oral hypoglycemic drugs: Secondary | ICD-10-CM

## 2022-09-21 DIAGNOSIS — E119 Type 2 diabetes mellitus without complications: Secondary | ICD-10-CM

## 2022-09-21 DIAGNOSIS — E782 Mixed hyperlipidemia: Secondary | ICD-10-CM

## 2022-09-21 DIAGNOSIS — R7989 Other specified abnormal findings of blood chemistry: Secondary | ICD-10-CM

## 2022-09-21 LAB — POCT GLYCOSYLATED HEMOGLOBIN (HGB A1C): Hemoglobin A1C: 8.7 % — AB (ref 4.0–5.6)

## 2022-09-21 MED ORDER — METFORMIN HCL ER 500 MG PO TB24
1000.0000 mg | ORAL_TABLET | Freq: Every day | ORAL | 3 refills | Status: DC
  Filled 2022-09-21 – 2022-10-08 (×2): qty 180, 90d supply, fill #0
  Filled 2023-02-05: qty 180, 90d supply, fill #1
  Filled 2023-05-01: qty 180, 90d supply, fill #2

## 2022-09-21 MED ORDER — FREESTYLE LITE TEST VI STRP
1.0000 | ORAL_STRIP | Freq: Two times a day (BID) | 3 refills | Status: AC
  Filled 2022-09-21 (×2): qty 100, 50d supply, fill #0

## 2022-09-21 MED ORDER — GLIPIZIDE ER 5 MG PO TB24
5.0000 mg | ORAL_TABLET | Freq: Every day | ORAL | 3 refills | Status: DC
  Filled 2022-09-21 – 2022-11-27 (×2): qty 90, 90d supply, fill #0
  Filled 2023-02-12: qty 90, 90d supply, fill #1
  Filled 2023-05-28: qty 90, 90d supply, fill #2
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
## 2022-09-22 ENCOUNTER — Ambulatory Visit (INDEPENDENT_AMBULATORY_CARE_PROVIDER_SITE_OTHER): Payer: No Typology Code available for payment source | Admitting: Family Medicine

## 2022-09-22 ENCOUNTER — Other Ambulatory Visit: Payer: Self-pay

## 2022-09-22 ENCOUNTER — Encounter: Payer: Self-pay | Admitting: Family Medicine

## 2022-09-22 VITALS — BP 122/71 | HR 80 | Temp 97.6°F | Ht 70.0 in | Wt 248.2 lb

## 2022-09-22 DIAGNOSIS — Z0000 Encounter for general adult medical examination without abnormal findings: Secondary | ICD-10-CM | POA: Diagnosis not present

## 2022-09-22 DIAGNOSIS — M1990 Unspecified osteoarthritis, unspecified site: Secondary | ICD-10-CM | POA: Diagnosis not present

## 2022-09-22 DIAGNOSIS — I10 Essential (primary) hypertension: Secondary | ICD-10-CM

## 2022-09-22 DIAGNOSIS — E782 Mixed hyperlipidemia: Secondary | ICD-10-CM

## 2022-09-22 DIAGNOSIS — E1165 Type 2 diabetes mellitus with hyperglycemia: Secondary | ICD-10-CM | POA: Diagnosis not present

## 2022-09-22 DIAGNOSIS — Z23 Encounter for immunization: Secondary | ICD-10-CM | POA: Diagnosis not present

## 2022-09-22 DIAGNOSIS — Z7984 Long term (current) use of oral hypoglycemic drugs: Secondary | ICD-10-CM

## 2022-09-22 LAB — URINALYSIS, ROUTINE W REFLEX MICROSCOPIC
Bilirubin, UA: NEGATIVE
Ketones, UA: NEGATIVE
Leukocytes,UA: NEGATIVE
Nitrite, UA: NEGATIVE
Protein,UA: NEGATIVE
RBC, UA: NEGATIVE
Specific Gravity, UA: 1.025 (ref 1.005–1.030)
Urobilinogen, Ur: 0.2 mg/dL (ref 0.2–1.0)
pH, UA: 5.5 (ref 5.0–7.5)

## 2022-09-22 LAB — MICROALBUMIN, URINE WAIVED
Creatinine, Urine Waived: 200 mg/dL (ref 10–300)
Microalb, Ur Waived: 30 mg/L — ABNORMAL HIGH (ref 0–19)
Microalb/Creat Ratio: <30 mg/g (ref <30)

## 2022-09-22 MED ORDER — OMEGA-3-ACID ETHYL ESTERS 1 G PO CAPS
1.0000 g | ORAL_CAPSULE | Freq: Every day | ORAL | 1 refills | Status: DC
  Filled 2022-09-22 – 2022-11-27 (×2): qty 90, 90d supply, fill #0
  Filled 2023-02-12: qty 90, 90d supply, fill #1

## 2022-09-22 MED ORDER — EZETIMIBE 10 MG PO TABS
10.0000 mg | ORAL_TABLET | Freq: Every day | ORAL | 1 refills | Status: DC
  Filled 2022-09-22 – 2022-11-27 (×2): qty 90, 90d supply, fill #0
  Filled 2023-02-12: qty 90, 90d supply, fill #1

## 2022-09-22 MED ORDER — PRAVASTATIN SODIUM 10 MG PO TABS
10.0000 mg | ORAL_TABLET | ORAL | 0 refills | Status: DC
  Filled 2022-09-22: qty 12, 84d supply, fill #0
  Filled 2022-10-08: qty 12, 84d supply, fill #1

## 2022-09-22 NOTE — Assessment & Plan Note (Signed)
Under good control on current regimen. Continue current regimen. Continue to monitor. Call with any concerns. Refills given. Labs drawn today.   

## 2022-09-22 NOTE — Assessment & Plan Note (Signed)
Had sugars drawn with endcrinology yesterday. Improved at 8.7 from 9.4. Now off prednisone. Continue to monitor. Call with any concerns.
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
## 2022-09-22 NOTE — Assessment & Plan Note (Signed)
Doing well not on medicine. Continue to monitor.

## 2022-09-22 NOTE — Assessment & Plan Note (Signed)
Continue to follow with rheumatology. Call with any concerns.  

## 2022-09-25 LAB — COMPREHENSIVE METABOLIC PANEL
ALT: 15 IU/L (ref 0–44)
AST: 14 IU/L (ref 0–40)
Albumin: 4.3 g/dL (ref 3.8–4.9)
Alkaline Phosphatase: 58 IU/L (ref 44–121)
BUN/Creatinine Ratio: 12 (ref 9–20)
BUN: 7 mg/dL (ref 6–24)
Bilirubin Total: 0.3 mg/dL (ref 0.0–1.2)
CO2: 18 mmol/L — ABNORMAL LOW (ref 20–29)
Calcium: 8.7 mg/dL (ref 8.7–10.2)
Chloride: 101 mmol/L (ref 96–106)
Creatinine, Ser: 0.57 mg/dL — ABNORMAL LOW (ref 0.76–1.27)
Globulin, Total: 2 g/dL (ref 1.5–4.5)
Glucose: 249 mg/dL — ABNORMAL HIGH (ref 70–99)
Potassium: 4.2 mmol/L (ref 3.5–5.2)
Sodium: 137 mmol/L (ref 134–144)
Total Protein: 6.3 g/dL (ref 6.0–8.5)
eGFR: 117 mL/min/{1.73_m2} (ref >59)

## 2022-09-25 LAB — CBC WITH DIFFERENTIAL/PLATELET
Basophils Absolute: 0.1 10*3/uL (ref 0.0–0.2)
Basos: 1 %
EOS (ABSOLUTE): 0.1 10*3/uL (ref 0.0–0.4)
Eos: 2 %
Hematocrit: 43.3 % (ref 37.5–51.0)
Hemoglobin: 13.9 g/dL (ref 13.0–17.7)
Immature Grans (Abs): 0.1 10*3/uL (ref 0.0–0.1)
Immature Granulocytes: 1 %
Lymphocytes Absolute: 2.8 10*3/uL (ref 0.7–3.1)
Lymphs: 34 %
MCH: 31.4 pg (ref 26.6–33.0)
MCHC: 32.1 g/dL (ref 31.5–35.7)
MCV: 98 fL — ABNORMAL HIGH (ref 79–97)
Monocytes Absolute: 0.5 10*3/uL (ref 0.1–0.9)
Monocytes: 7 %
Neutrophils Absolute: 4.6 10*3/uL (ref 1.4–7.0)
Neutrophils: 55 %
Platelets: 407 10*3/uL (ref 150–450)
RBC: 4.43 x10E6/uL (ref 4.14–5.80)
RDW: 13.2 % (ref 11.6–15.4)
WBC: 8.2 10*3/uL (ref 3.4–10.8)

## 2022-09-25 LAB — LIPID PANEL W/O CHOL/HDL RATIO
Cholesterol, Total: 218 mg/dL — ABNORMAL HIGH (ref 100–199)
HDL: 29 mg/dL — ABNORMAL LOW (ref >39)
Triglycerides: 838 mg/dL (ref 0–149)

## 2022-09-25 LAB — TSH: TSH: 0.391 u[IU]/mL — ABNORMAL LOW (ref 0.450–4.500)

## 2022-09-25 LAB — PSA: Prostate Specific Ag, Serum: 0.4 ng/mL (ref 0.0–4.0)

## 2022-09-28 ENCOUNTER — Other Ambulatory Visit: Payer: Self-pay

## 2022-09-29 ENCOUNTER — Other Ambulatory Visit: Payer: Self-pay

## 2022-09-29 ENCOUNTER — Ambulatory Visit: Payer: No Typology Code available for payment source | Admitting: Nurse Practitioner

## 2022-09-29 MED ORDER — FREESTYLE LIBRE 3 PLUS SENSOR MISC
0 refills | Status: DC
  Filled 2022-09-29: qty 6, 84d supply, fill #0

## 2022-09-30 ENCOUNTER — Other Ambulatory Visit: Payer: Self-pay

## 2022-10-07 ENCOUNTER — Other Ambulatory Visit: Payer: Self-pay

## 2022-10-07 MED ORDER — PREDNISONE 10 MG PO TABS
ORAL_TABLET | ORAL | 0 refills | Status: DC
  Filled 2022-10-07: qty 30, 12d supply, fill #0

## 2022-10-09 ENCOUNTER — Other Ambulatory Visit: Payer: Self-pay

## 2022-10-10 ENCOUNTER — Other Ambulatory Visit: Payer: Self-pay

## 2022-10-10 DIAGNOSIS — M069 Rheumatoid arthritis, unspecified: Secondary | ICD-10-CM | POA: Diagnosis not present

## 2022-10-10 DIAGNOSIS — M4722 Other spondylosis with radiculopathy, cervical region: Secondary | ICD-10-CM | POA: Diagnosis not present

## 2022-10-10 DIAGNOSIS — Z79899 Other long term (current) drug therapy: Secondary | ICD-10-CM | POA: Diagnosis not present

## 2022-10-10 DIAGNOSIS — M4712 Other spondylosis with myelopathy, cervical region: Secondary | ICD-10-CM | POA: Diagnosis not present

## 2022-10-10 DIAGNOSIS — M0609 Rheumatoid arthritis without rheumatoid factor, multiple sites: Secondary | ICD-10-CM | POA: Diagnosis not present

## 2022-10-10 MED ORDER — HYDROXYCHLOROQUINE SULFATE 200 MG PO TABS
200.0000 mg | ORAL_TABLET | Freq: Two times a day (BID) | ORAL | 5 refills | Status: DC
  Filled 2022-10-10: qty 60, 30d supply, fill #0

## 2022-10-17 ENCOUNTER — Other Ambulatory Visit: Payer: Self-pay

## 2022-10-18 ENCOUNTER — Other Ambulatory Visit: Payer: Self-pay

## 2022-10-19 ENCOUNTER — Other Ambulatory Visit: Payer: Self-pay

## 2022-10-19 MED ORDER — ENBREL SURECLICK 50 MG/ML ~~LOC~~ SOAJ: SUBCUTANEOUS | 1 refills | Status: DC

## 2022-10-20 ENCOUNTER — Ambulatory Visit: Payer: No Typology Code available for payment source | Admitting: Dietician

## 2022-10-20 ENCOUNTER — Encounter: Payer: Self-pay | Admitting: Dietician

## 2022-10-20 ENCOUNTER — Other Ambulatory Visit: Payer: Self-pay

## 2022-10-20 ENCOUNTER — Encounter: Payer: No Typology Code available for payment source | Attending: Family Medicine | Admitting: Dietician

## 2022-10-20 DIAGNOSIS — Z713 Dietary counseling and surveillance: Secondary | ICD-10-CM | POA: Diagnosis not present

## 2022-10-20 DIAGNOSIS — E1165 Type 2 diabetes mellitus with hyperglycemia: Secondary | ICD-10-CM | POA: Diagnosis not present

## 2022-10-20 DIAGNOSIS — M1990 Unspecified osteoarthritis, unspecified site: Secondary | ICD-10-CM | POA: Insufficient documentation

## 2022-10-20 NOTE — Patient Instructions (Signed)
Start your day with a balanced breakfast of carbs and protein. Have less butter or margarine in your grits, choose an english muffin instead of a biscuit, try some oatmeal lightly sweetened.  Work towards eating three meals a day, about 5-6 hours apart!  Begin to recognize carbohydrates, proteins, and non-starchy vegetables in your food choices!  Begin to build your meals using the proportions of the Balanced Plate. First, select your carb choice(s) for the meal. Make this 25% of your meal. Next, select your source of protein to pair with your carb choice(s). Make this another 25% of your meal. Finally, complete your meal with a variety of non-starchy vegetables. Make this the remaining 50% of your meal.

## 2022-10-20 NOTE — Progress Notes (Signed)
Diabetes Self-Management Education  Visit Type: First/Initial  Appt. Start Time: 1420 Appt. End Time: 1530  10/20/2022  [name], identified by name and date of birth, is a 52 y.o. male with a diagnosis of Diabetes: Type 2.   ASSESSMENT  There were no vitals taken for this visit. There is no height or weight on file to calculate BMI.  Pt spouse, Marcelino Duster, present for appointment Pt reports attending diabetes core class when first diagnosed in 2017, would like a refresher on DM. Pt reports battling RA, flare ups not uncommon and can be debilitating, reports ow dose prednisone PRN for RA flare ups, runs BG very high. Pt reports taking Glipizide and metformin, no GI distress or hypoglycemia reported. Pt reports hx of taking a sample of once daily insulin and saw significant improvement in blood glucose, FBG values consistently under 130.  Pt reports insulin shot would cause RA flare ups, so they stopped taking it. Pt reports having getting a prescription for a FreeStyle Libre 3, waiting to run out of test strips for their glucometer to start the CGM. Pt states they are working on reducing sweets and drinking more water. Pt reports missing breakfast/lunch 1-2 times a week. 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    Diabetes Self-Management Education - 10/20/22 1432       Visit Information   Visit Type First/Initial      Initial Visit   Diabetes Type Type 2    Date Diagnosed 2017    Are you currently following a meal plan? No    Are you taking your medications as prescribed? Yes      Health Coping   How would you rate your overall health? Fair      Psychosocial Assessment   Patient Belief/Attitude about Diabetes Motivated to manage diabetes    What is the hardest part about your diabetes right now, causing you the most concern, or is the most worrisome to you about your  diabetes?   Making healty food and beverage choices    Self-care barriers Debilitated state due to current medical condition   RA, nerve pain   Self-management support Doctor's office;Family    Other persons present Patient;Spouse/SO    Patient Concerns Nutrition/Meal planning;Healthy Lifestyle;Glycemic Control    Special Needs None    Preferred Learning Style No preference indicated    Learning Readiness Ready    How often do you need to have someone help you when you read instructions, pamphlets, or other written materials from your doctor or pharmacy? 1 - Never    What is the last grade level you completed in school? 12th grade      Pre-Education Assessment   Patient understands the diabetes disease and treatment process. Needs Review    Patient understands incorporating nutritional management into lifestyle. Needs Review    Patient undertands incorporating physical activity into lifestyle. Needs Review    Patient understands using medications safely. Needs Review    Patient understands monitoring blood glucose, interpreting and using results Needs Review    Patient understands prevention, detection, and treatment of acute complications. Needs Review    Patient understands prevention, detection, and treatment of chronic complications. Needs Review    Patient understands how to develop strategies to address psychosocial issues. Needs Review    Patient understands how to develop strategies to promote health/change behavior. Needs Review  Complications   Last HgB A1C per patient/outside source 8.7 %   09/21/2022   How often do you check your blood sugar? 1-2 times/day    Fasting Blood glucose range (mg/dL) 161-096   Higher because recent course of Prednisone   Number of hypoglycemic episodes per month 0    Number of hyperglycemic episodes ( >200mg /dL): Occasional    Can you tell when your blood sugar is high? Yes    What do you do if your blood sugar is high? Drinks a lot of water       Dietary Intake   Breakfast None    Snack (morning) none    Lunch None    Snack (afternoon) none    Dinner 1/2 [employer] cheese sub, 1/2 [place] sub    Beverage(s) Water, Dr. Reino Kent      Activity / Exercise   Activity / Exercise Type ADL's;Light (walking / raking leaves)   No structured exercise   How many days per week do you exercise? 0    How many minutes per day do you exercise? 0    Total minutes per week of exercise 0      Patient Education   Previous Diabetes Education Yes (please comment)   2017   Disease Pathophysiology Explored patient's options for treatment of their diabetes    Healthy Eating Plate Method;Role of diet in the treatment of diabetes and the relationship between the three main macronutrients and blood glucose level;Meal timing in regards to the patients' current diabetes medication.    Being Active Role of exercise on diabetes management, blood pressure control and cardiac health.    Medications Reviewed patients medication for diabetes, action, purpose, timing of dose and side effects.    Monitoring Identified appropriate SMBG and/or A1C goals.    Acute complications Discussed and identified patients' prevention, symptoms, and treatment of hyperglycemia.    Chronic complications Relationship between chronic complications and blood glucose control    Diabetes Stress and Support Role of stress on diabetes      Individualized Goals (developed by patient)   Nutrition Follow meal plan discussed    Medications take my medication as prescribed    Monitoring  Test my blood glucose as discussed;Send in my blood glucose log as discussed    Problem Solving Eating Pattern    Reducing Risk examine blood glucose patterns    Health Coping Ask for help with psychological, social, or emotional issues      Post-Education Assessment   Patient understands the diabetes disease and treatment process. Comprehends key points    Patient understands incorporating  nutritional management into lifestyle. Comprehends key points    Patient undertands incorporating physical activity into lifestyle. Comprehends key points    Patient understands using medications safely. Comphrehends key points    Patient understands prevention, detection, and treatment of acute complications. Comprehends key points    Patient understands prevention, detection, and treatment of chronic complications. Comprehends key points    Patient understands how to develop strategies to address psychosocial issues. Comprehends key points    Patient understands how to develop strategies to promote health/change behavior. Comprehends key points      Outcomes   Expected Outcomes Demonstrated interest in learning. Expect positive outcomes    Future DMSE 3-4 months    Program Status Not Completed             Individualized Plan for Diabetes Self-Management Training:   Learning Objective:  Patient will have a  greater understanding of diabetes self-management. Patient education plan is to attend individual and/or group sessions per assessed needs and concerns.   Plan:   Patient Instructions  Start your day with a balanced breakfast of carbs and protein. Have less butter or margarine in your grits, choose an english muffin instead of a biscuit, try some oatmeal lightly sweetened.  Work towards eating three meals a day, about 5-6 hours apart!  Begin to recognize carbohydrates, proteins, and non-starchy vegetables in your food choices!  Begin to build your meals using the proportions of the Balanced Plate. First, select your carb choice(s) for the meal. Make this 25% of your meal. Next, select your source of protein to pair with your carb choice(s). Make this another 25% of your meal. Finally, complete your meal with a variety of non-starchy vegetables. Make this the remaining 50% of your meal.   Expected Outcomes:  Demonstrated interest in learning. Expect positive  outcomes  Education material provided: ADA - How to Thrive: A Guide for Your Journey with Diabetes and My Plate, Food list  If problems or questions, patient to contact team via:  Phone and Email  Future DSME appointment: 3-4 months

## 2022-10-21 ENCOUNTER — Other Ambulatory Visit (HOSPITAL_COMMUNITY): Payer: Self-pay

## 2022-10-26 ENCOUNTER — Other Ambulatory Visit: Payer: Self-pay | Admitting: Family Medicine

## 2022-10-26 ENCOUNTER — Other Ambulatory Visit: Payer: Self-pay | Admitting: Nurse Practitioner

## 2022-10-26 ENCOUNTER — Other Ambulatory Visit: Payer: Self-pay

## 2022-10-26 NOTE — Telephone Encounter (Signed)
Requested Prescriptions  Pending Prescriptions Disp Refills   lisinopril (ZESTRIL) 5 MG tablet 90 tablet 1    Sig: TAKE 1 TABLET (5 MG TOTAL) BY MOUTH DAILY.     Cardiovascular:  ACE Inhibitors Failed - 10/26/2022 11:33 AM      Failed - Cr in normal range and within 180 days    Creatinine  Date Value Ref Range Status  02/11/2013 0.82 0.60 - 1.30 mg/dL Final   Creatinine, Ser  Date Value Ref Range Status  09/22/2022 0.57 (L) 0.76 - 1.27 mg/dL Final         Passed - K in normal range and within 180 days    Potassium  Date Value Ref Range Status  09/22/2022 4.2 3.5 - 5.2 mmol/L Final  02/11/2013 3.9 3.5 - 5.1 mmol/L Final         Passed - Patient is not pregnant      Passed - Last BP in normal range    BP Readings from Last 1 Encounters:  09/22/22 122/71         Passed - Valid encounter within last 6 months    Recent Outpatient Visits           1 month ago Routine general medical examination at a health care facility   [facility], [place], DO   4 months ago Type 2 diabetes mellitus with hyperglycemia, without long-term current use of insulin [facility])   [facility] [place], Megan P, DO   5 months ago Acute non-recurrent maxillary sinusitis   [facility] [place], Megan P, DO   5 months ago Sore throat   [facility] [place] [facility], [place], DO   7 months ago [employer] preventive visit   [facility] [place], [place], DO       Future Appointments             In 4 months Laural Benes, Oralia Rud, DO  [facility]

## 2022-10-27 ENCOUNTER — Other Ambulatory Visit: Payer: Self-pay

## 2022-10-27 MED ORDER — PREDNISONE 5 MG PO TABS
ORAL_TABLET | ORAL | 0 refills | Status: DC
  Filled 2022-10-27: qty 70, 28d supply, fill #0

## 2022-11-01 ENCOUNTER — Encounter
Payer: No Typology Code available for payment source | Admitting: Student in an Organized Health Care Education/Training Program

## 2022-11-05 ENCOUNTER — Other Ambulatory Visit: Payer: Self-pay | Admitting: Family Medicine

## 2022-11-07 ENCOUNTER — Other Ambulatory Visit: Payer: Self-pay

## 2022-11-07 ENCOUNTER — Encounter: Payer: Self-pay | Admitting: Family Medicine

## 2022-11-07 ENCOUNTER — Other Ambulatory Visit: Payer: Self-pay | Admitting: Family Medicine

## 2022-11-07 MED ORDER — LISINOPRIL 5 MG PO TABS
5.0000 mg | ORAL_TABLET | Freq: Every day | ORAL | 0 refills | Status: DC
  Filled 2022-11-07: qty 90, 90d supply, fill #0

## 2022-11-07 NOTE — Telephone Encounter (Signed)
It looks like this medicine was discontinued in August. Is he still taking it?

## 2022-11-10 ENCOUNTER — Encounter
Payer: No Typology Code available for payment source | Admitting: Student in an Organized Health Care Education/Training Program

## 2022-11-16 ENCOUNTER — Other Ambulatory Visit: Payer: Self-pay

## 2022-11-17 ENCOUNTER — Other Ambulatory Visit: Payer: Self-pay

## 2022-11-17 ENCOUNTER — Ambulatory Visit
Payer: No Typology Code available for payment source | Attending: Student in an Organized Health Care Education/Training Program | Admitting: Student in an Organized Health Care Education/Training Program

## 2022-11-17 ENCOUNTER — Encounter: Payer: Self-pay | Admitting: Student in an Organized Health Care Education/Training Program

## 2022-11-17 VITALS — BP 126/83 | HR 90 | Temp 97.9°F | Resp 18 | Ht 71.0 in | Wt 240.0 lb

## 2022-11-17 DIAGNOSIS — M542 Cervicalgia: Secondary | ICD-10-CM | POA: Diagnosis not present

## 2022-11-17 DIAGNOSIS — M1990 Unspecified osteoarthritis, unspecified site: Secondary | ICD-10-CM | POA: Diagnosis not present

## 2022-11-17 DIAGNOSIS — M5412 Radiculopathy, cervical region: Secondary | ICD-10-CM | POA: Diagnosis not present

## 2022-11-17 DIAGNOSIS — Q761 Klippel-Feil syndrome: Secondary | ICD-10-CM | POA: Diagnosis not present

## 2022-11-17 DIAGNOSIS — Z981 Arthrodesis status: Secondary | ICD-10-CM | POA: Diagnosis not present

## 2022-11-17 DIAGNOSIS — G894 Chronic pain syndrome: Secondary | ICD-10-CM | POA: Diagnosis not present

## 2022-11-17 MED ORDER — HYDROCODONE-ACETAMINOPHEN 7.5-325 MG PO TABS
1.0000 | ORAL_TABLET | Freq: Four times a day (QID) | ORAL | 0 refills | Status: DC | PRN
  Filled 2023-01-16: qty 120, 30d supply, fill #0

## 2022-11-17 MED ORDER — HYDROCODONE-ACETAMINOPHEN 7.5-325 MG PO TABS
1.0000 | ORAL_TABLET | Freq: Four times a day (QID) | ORAL | 0 refills | Status: AC | PRN
  Filled 2022-12-16: qty 120, 30d supply, fill #0

## 2022-11-17 MED ORDER — HYDROCODONE-ACETAMINOPHEN 7.5-325 MG PO TABS
1.0000 | ORAL_TABLET | Freq: Four times a day (QID) | ORAL | 0 refills | Status: AC | PRN
  Filled 2022-11-17: qty 120, 30d supply, fill #0

## 2022-11-17 NOTE — Progress Notes (Signed)
Nursing Pain Medication Assessment:  Safety precautions to be maintained throughout the outpatient stay will include: orient to surroundings, keep bed in low position, maintain call bell within reach at all times, provide assistance with transfer out of bed and ambulation.  Medication Inspection Compliance: Pill count conducted under aseptic conditions, in front of the patient. Neither the pills nor the bottle was removed from the patient's sight at any time. Once count was completed pills were immediately returned to the patient in their original bottle.  Medication: Hydrocodone/APAP Pill/Patch Count:  3 of 120 pills remain Pill/Patch Appearance: Markings consistent with prescribed medication Bottle Appearance: Standard pharmacy container. Clearly labeled. Filled Date: 10 / 08 / 2024 Last Medication intake:  Today

## 2022-11-17 NOTE — Progress Notes (Signed)
PROVIDER NOTE: Information contained herein reflects review and annotations entered in association with encounter. Interpretation of such information and data should be left to medically-trained personnel. Information provided to patient can be located elsewhere in the medical record under "Patient Instructions". Document created using STT-dictation technology, any transcriptional errors that may result from process are unintentional.    Patient: Tony Cardenas  Service Category: E/M  Provider: Edward Jolly, MD  DOB: [birth year]  DOS: 08/18/2020  Specialty: Interventional Pain Management  MRN: [id number]  Setting: Ambulatory outpatient  PCP: Dorcas Carrow, DO  Type: Established Patient    Referring Provider: Dorcas Carrow, DO  Location: Office  Delivery: Face-to-face     HPI  [name], a 52 y.o. year old male, is here today because of his Chronic pain syndrome [G89.4]. [name] primary complain today is Neck Pain and Shoulder Pain (Bilateral )  Last encounter: My last encounter with him was on 08/11/22 Pertinent problems: Tony Cardenas has S/P cervical spinal fusion; Cervical radicular pain; Cervical fusion syndrome; Chronic pain syndrome; and Cervical spondylosis with myelopathy and radiculopathy on their pertinent problem list. Pain Assessment: Severity of Chronic pain is reported as a 7 /10. Location: Neck Posterior/both shoulders, to arms, through to fingers bilaterally. Onset: More than a month ago. Quality: Dull, Nagging, Radiating. Timing: Constant. Modifying factor(s): medications, rest. Vitals:  height is 5\' 11"  (1.803 m) and weight is 240 lb (108.9 kg). His temporal temperature is 97.9 F (36.6 C). His blood pressure is 126/83 and his pulse is 90. His respiration is 18 and oxygen saturation is 99%.   Reason for encounter: medication management.    Patient states that he was recently started on Enbrel for rheumatoid arthritis and has noticed significant  improvement in his joint pain as well as range of motion.  He is hopeful about treatment. Otherwise he continues his medications as prescribed, no significant change in dose.   Pharmacotherapy Assessment  Analgesic: Hydrocodone 7.5 mg QID PRN   Monitoring: [place] PMP: PDMP reviewed during this encounter.       Pharmacotherapy: No side-effects or adverse reactions reported. Compliance: No problems identified. Effectiveness: Clinically acceptable.  UDS:  Summary  Date Value Ref Range Status  01/27/2022 Note  Final    Comment:    ==================================================================== [employer] Select 13 (MW) ==================================================================== Test                             Result       Flag       Units  Drug Present and Declared for Prescription Verification   Hydrocodone                    2440         EXPECTED   ng/mg creat   Hydromorphone                  1400         EXPECTED   ng/mg creat   Dihydrocodeine                 320          EXPECTED   ng/mg creat   Norhydrocodone                 1114         EXPECTED   ng/mg creat    Sources of hydrocodone include scheduled prescription medications.  Hydromorphone, dihydrocodeine and norhydrocodone are expected    metabolites of hydrocodone. Hydromorphone and dihydrocodeine are    also available as scheduled prescription medications.  ==================================================================== Test                      Result    Flag   Units      Ref Range   Creatinine              50               mg/dL      >=61 ==================================================================== Declared Medications:  The flagging and interpretation on this report are based on the  following declared medications.  Unexpected results may arise from  inaccuracies in the declared medications.   **Note: The testing scope of this panel includes these medications:   Hydrocodone (Norco)    **Note: The testing scope of this panel does not include the  following reported medications:   Acetaminophen (Norco)  Albuterol (Ventolin HFA)  Evolocumab (Repatha)  Ezetimibe (Zetia)  Fluticasone (Flonase)  Folic Acid  Lisinopril (Zestril)  Metformin (Glucophage)  Methocarbamol (Robaxin)  Methotrexate  Omega-3 Fatty Acids  Pravastatin (Pravachol)  Vitamin D2 (Drisdol) ==================================================================== For clinical consultation, please call [phone]. ====================================================================        ROS  Constitutional: Denies any fever or chills Gastrointestinal: No reported hemesis, hematochezia, vomiting, or acute GI distress Musculoskeletal:  mild cervical and trapezius pain Neurological: No reported episodes of acute onset apraxia, aphasia, dysarthria, agnosia, amnesia, paralysis, loss of coordination, or loss of consciousness  Medication Review  FreeStyle Freedom Lite, FreeStyle Libre 3 Sensor, HYDROcodone-acetaminophen, Vitamin D (Ergocalciferol), albuterol, etanercept, ezetimibe, fluticasone, folic acid, freestyle, glipiZIDE, glucose blood, hydroxychloroquine, lisinopril, metFORMIN, methocarbamol, methotrexate, and omega-3 acid ethyl esters  History Review  Allergy: Tony Cardenas is allergic to atorvastatin, hydromorphone hcl, and semaglutide. Drug: Tony Cardenas  reports no history of drug use. Alcohol:  reports no history of alcohol use. Tobacco:  reports that he quit smoking about 3 years ago. His smoking use included cigarettes. He started smoking about 23 years ago. He has a 20 pack-year smoking history. He quit smokeless tobacco use about 6 years ago.  His smokeless tobacco use included chew. Social: Tony Cardenas  reports that he quit smoking about 3 years ago. His smoking use included cigarettes. He started smoking about 23 years ago. He has a 20 pack-year smoking history. He quit smokeless tobacco  use about 6 years ago.  His smokeless tobacco use included chew. He reports that he does not drink alcohol and does not use drugs. Medical:  has a past medical history of Arthritis, COPD (chronic obstructive pulmonary disease) (HCC), Diabetes mellitus without complication (HCC), GERD (gastroesophageal reflux disease), Hyperlipidemia, Hypertension, and Neck pain (1989). Surgical: Tony Cardenas  has a past surgical history that includes Neck surgery (1989); Knee surgery (Right); Colonoscopy (N/A, 06/02/2014); Polypectomy (06/02/2014); Appendectomy; Esophagogastroduodenoscopy (egd) with propofol (N/A, 07/29/2016); Back surgery; Anterior cervical corpectomy (N/A, 10/10/2018); Hernia repair (over 10 years ago); Anterior cervical corpectomy (N/A, 05/01/2019); Posterior cervical fusion/foraminotomy (N/A, 05/01/2019); Colonoscopy with propofol (N/A, 11/15/2021); Spine surgery; and Cholecystectomy. Family: family history includes Alcohol abuse in his father; COPD in his father; Cancer in his mother; Diabetes in his brother, father, and paternal grandmother.  Laboratory Chemistry Profile   Renal Lab Results  Component Value Date   BUN 7 09/22/2022   CREATININE 0.57 (L) 09/22/2022   BCR 12 09/22/2022   GFRAA 114 12/16/2019   GFRNONAA >60  11/11/2021     Hepatic Lab Results  Component Value Date   AST 14 09/22/2022   ALT 15 09/22/2022   ALBUMIN 4.3 09/22/2022   ALKPHOS 58 09/22/2022   HCVAB NON REACTIVE 03/24/2020     Electrolytes Lab Results  Component Value Date   NA 137 09/22/2022   K 4.2 09/22/2022   CL 101 09/22/2022   CALCIUM 8.7 09/22/2022     Bone Lab Results  Component Value Date   VD25OH 23.1 (L) 09/02/2021     Inflammation (CRP: Acute Phase) (ESR: Chronic Phase) Lab Results  Component Value Date   CRP 0.7 05/26/2020   ESRSEDRATE 31 (H) 11/11/2021       Note: Above Lab results reviewed.  Recent Imaging Review  US THYROID CLINICAL DATA:  Goiter.  Neck compressive  symptoms.  EXAM: THYROID ULTRASOUND  TECHNIQUE: Ultrasound examination of the thyroid gland and adjacent soft tissues was performed.  COMPARISON:  06/16/2017  FINDINGS: Parenchymal Echotexture: Mildly heterogenous  Isthmus: 0.7 cm  Right lobe: 6.7 x 2.9 x 3.4 cm  Left lobe: 6.7 x 2.9 x 2.6 cm  _________________________________________________________  Estimated total number of nodules >/= 1 cm: 0  Number of spongiform nodules >/=  2 cm not described below (TR1): 0  Number of mixed cystic and solid nodules >/= 1.5 cm not described below (TR2): 0  _________________________________________________________  No discrete nodules are seen within the thyroid gland. No enlarged or abnormal appearing lymph nodes are identified.  IMPRESSION: Enlarged and mildly heterogeneous thyroid gland without discrete nodules. Overall volume of the thyroid gland is similar to the 2019 study.  The above is in keeping with the ACR TI-RADS recommendations - J Am Coll Radiol 2017;14:587-595.  Electronically Signed   By: Irish Lack M.D.   On: 12/13/2021 15:02  Note: Reviewed        Physical Exam  General appearance: Well nourished, well developed, and well hydrated. In no apparent acute distress Mental status: Alert, oriented x 3 (person, place, & time)       Respiratory: No evidence of acute respiratory distress Eyes: PERLA Vitals: BP 126/83   Pulse 90   Temp 97.9 F (36.6 C) (Temporal)   Resp 18   Ht 5\' 11"  (1.803 m)   Wt 240 lb (108.9 kg)   SpO2 99%   BMI 33.47 kg/m  BMI: Estimated body mass index is 33.47 kg/m as calculated from the following:   Height as of this encounter: 5\' 11"  (1.803 m).   Weight as of this encounter: 240 lb (108.9 kg). Ideal: Ideal body weight: 75.3 kg (166 lb 0.1 oz) Adjusted ideal body weight: 88.7 kg (195 lb 9.7 oz)  Limited range of motion with cervical extension.   Neuropathic pain pattern of upper extremity, persistent Neuropathic pain  of bilateral lower extremity   Assessment   Status Diagnosis  Controlled Controlled Controlled 1. Chronic pain syndrome   2. Cervical fusion syndrome   3. Cervical radicular pain   4. Cervicalgia   5. Chronic inflammatory arthritis   6. S/P cervical spinal fusion        Plan of Care   Tony Cardenas has a current medication list which includes the following long-term medication(s): albuterol, enbrel sureclick, ezetimibe, fluticasone, glipizide, lisinopril, metformin, and omega-3 acid ethyl esters.  Requested Prescriptions   Signed Prescriptions Disp Refills   HYDROcodone-acetaminophen (NORCO) 7.5-325 MG tablet 120 tablet 0    Sig: Take 1 tablet by mouth every 6 (six) hours as  needed for moderate pain (pain score 4-6).   HYDROcodone-acetaminophen (NORCO) 7.5-325 MG tablet 120 tablet 0    Sig: Take 1 tablet by mouth every 6 (six) hours as needed for moderate pain (pain score 4-6).   HYDROcodone-acetaminophen (NORCO) 7.5-325 MG tablet 120 tablet 0    Sig: Take 1 tablet by mouth every 6 (six) hours as needed for moderate pain (pain score 4-6).   Continue Robaxin prn Continue with blood glucose management  Discussed Qutenza, capsaicin 8% treatment for painful diabetic neuropathy of lower extremity.  Follow-up plan:   Return in about 3 months (around 02/17/2023) for MM, F2F.   Recent Visits No visits were found meeting these conditions. Showing recent visits within past 90 days and meeting all other requirements Today's Visits Date Type Provider Dept  11/17/22 Office Visit Edward Jolly, MD [facility]  Showing today's visits and meeting all other requirements Future Appointments Date Type Provider Dept  02/14/23 Appointment Edward Jolly, MD [facility]  Showing future appointments within next 90 days and meeting all other requirements  I discussed the assessment and treatment plan with the patient. The patient was provided an opportunity to ask  questions and all were answered. The patient agreed with the plan and demonstrated an understanding of the instructions.  Patient advised to call back or seek an in-person evaluation if the symptoms or condition worsens.  Duration of encounter: .  Note by: Edward Jolly, MD Date: 11/17/2022; Time: 8:41 AM

## 2022-11-18 ENCOUNTER — Ambulatory Visit (INDEPENDENT_AMBULATORY_CARE_PROVIDER_SITE_OTHER): Payer: No Typology Code available for payment source | Admitting: Pediatrics

## 2022-11-18 ENCOUNTER — Telehealth: Payer: Self-pay | Admitting: Family Medicine

## 2022-11-18 ENCOUNTER — Encounter: Payer: Self-pay | Admitting: Pediatrics

## 2022-11-18 ENCOUNTER — Other Ambulatory Visit: Payer: Self-pay

## 2022-11-18 VITALS — BP 126/72 | HR 86 | Temp 98.7°F | Ht 70.5 in | Wt 244.2 lb

## 2022-11-18 DIAGNOSIS — J189 Pneumonia, unspecified organism: Secondary | ICD-10-CM

## 2022-11-18 DIAGNOSIS — Z0100 Encounter for examination of eyes and vision without abnormal findings: Secondary | ICD-10-CM | POA: Diagnosis not present

## 2022-11-18 DIAGNOSIS — M2550 Pain in unspecified joint: Secondary | ICD-10-CM | POA: Diagnosis not present

## 2022-11-18 DIAGNOSIS — Z5320 Procedure and treatment not carried out because of patient's decision for unspecified reasons: Secondary | ICD-10-CM | POA: Diagnosis not present

## 2022-11-18 DIAGNOSIS — R051 Acute cough: Secondary | ICD-10-CM

## 2022-11-18 MED ORDER — AZITHROMYCIN 250 MG PO TABS
ORAL_TABLET | ORAL | 0 refills | Status: AC
  Filled 2022-11-18: qty 6, 5d supply, fill #0

## 2022-11-18 NOTE — Telephone Encounter (Signed)
Pt is calling in because he saw a NP today and they spoke about getting an antibiotic as well as a boost pack steroid. Pt says he got the antibiotic but the steroid wasn't at the pharmacy. Pt wants to know when that will be sent in and if someone can call him with an update.

## 2022-11-18 NOTE — Progress Notes (Signed)
Acute Visit  BP 126/72   Pulse 86   Temp 98.7 F (37.1 C) (Oral)   Ht 5' 10.5" (1.791 m)   Wt 244 lb 3.2 oz (110.8 kg)   SpO2 98%   BMI 34.54 kg/m    Subjective:    Patient ID: Tony Cardenas, male    DOB: [birth year], 52 y.o.   MRN: [id number]  HPI: Tony Cardenas is a 52 y.o. male  Chief Complaint  Patient presents with   URI    Patient states he has been having congestion, cough, sinus pressure, and headache for the last 2 weeks. States he feels like the congestion has settled in his chest as he has been coughing up green phlegm.    Discussed the use of AI scribe software for clinical note transcription with the patient, who gave verbal consent to proceed.  History of Present Illness   The patient, with a history of diabetes and rheumatoid arthritis, presents with a worsening respiratory illness of approximately one and a half weeks duration. Initially, the patient managed symptoms with over-the-counter Sudafed, but noticed a decline in his condition with the development of a cough midweek. The patient describes a sensation of chest tightness and increased coughing, particularly at night. The patient's spouse, who has been ill for two weeks, has tested negative for both COVID and influenza.  The patient has been using an albuterol inhaler, which has provided some relief. However, the patient has become increasingly concerned due to a perceived shift in symptoms from the upper to the lower respiratory tract, and a decrease in energy levels. The patient has a history of pneumonia, which required hospitalization and ventilator support, and is keen to avoid a similar situation.  The patient is a former smoker, having quit approximately five years ago. He has also recently started on Enbrel for his rheumatoid arthritis. The patient has not experienced any fevers, chills, nausea, or vomiting, and does not have any issues with breathing when lying down at night.      Relevant  past medical, surgical, family and social history reviewed and updated as indicated. Interim medical history since our last visit reviewed. Allergies and medications reviewed and updated.  ROS per HPI unless specifically indicated above     Objective:    BP 126/72   Pulse 86   Temp 98.7 F (37.1 C) (Oral)   Ht 5' 10.5" (1.791 m)   Wt 244 lb 3.2 oz (110.8 kg)   SpO2 98%   BMI 34.54 kg/m   Wt Readings from Last 3 Encounters:  11/18/22 244 lb 3.2 oz (110.8 kg)  11/17/22 240 lb (108.9 kg)  09/22/22 248 lb 3.2 oz (112.6 kg)     Physical Exam Constitutional:      Appearance: Normal appearance.  HENT:     Head: Normocephalic and atraumatic.  Eyes:     Pupils: Pupils are equal, round, and reactive to light.  Cardiovascular:     Rate and Rhythm: Normal rate and regular rhythm.     Pulses: Normal pulses.     Heart sounds: Normal heart sounds.  Pulmonary:     Effort: Pulmonary effort is normal. No respiratory distress.     Breath sounds: Wheezing present. No rales.     Comments: Anterior upper lung feels with inspiratory wheezing cleared with deep breathing Musculoskeletal:        General: Normal range of motion.     Cervical back: Normal range of motion.  Skin:  General: Skin is warm and dry.     Capillary Refill: Capillary refill takes less than 2 seconds.  Neurological:     General: No focal deficit present.     Mental Status: He is alert. Mental status is at baseline.  Psychiatric:        Mood and Affect: Mood normal.        Behavior: Behavior normal.        Assessment & Plan:  Assessment & Plan   Acute cough Atypical pneumonia Symptoms for approximately 1.5 weeks, initially improved with over-the-counter Sudafed, but now worsening with development of a cough and chest tightness. No fever, chills, nausea, or vomiting. Wife with similar symptoms tested negative for COVID and flu. Mild wheezing noted on examination. Do not suspect COPD (h/o smoking) exacerbation,  likely atypical pneumonia. -Prescribe Z-Pak (Azithromycin) to be sent to [facility] pharmacy. -Recommend over-the-counter Robitussin or Mucinex DM to help break down mucus. -Continue use of Albuterol inhaler as needed. -Return for follow-up if symptoms worsen or do not improve. -     Azithromycin; Take 2 tablets on day 1, then 1 tablet daily on days 2 through 5  Dispense: 6 tablet; Refill: 0  Polyarthralgia Severe Rheumatoid Arthritis per patient.  Recently started on Enbrel, which is being received for free due to disability status. Requested disability placard, completed and sent to clinical team.  Lung cancer screening declined by patient Successful cessation for approximately 5 years. Discussed potential for lung cancer screening due to history of heavy smoking. -Consider lung cancer screening with CT scan. Patient to decide and let PCP know  Follow up plan: Return if symptoms worsen or fail to improve.  Tony Kennard Howell Pringle, MD

## 2022-11-18 NOTE — Patient Instructions (Addendum)
Sent zpack for suspected bacterial or atypical pneumonia.  Most cold symptoms last up to 2 weeks, but cough can sometimes linger up to 4 weeks.  However if your symtpoms get WORSE - like you develop fevers or get more shortness of breath, then call your clinic as you may need to be evaluated.   Aches and Pains Acetaminophen (Tylenol): 1000mg  ("extra strength" tablets are 500mg , so take 2) every 8 hours if needed  Ibuprofen (Advil/Motrin) 400-800mg  (comes in 200mg  pills OTC, so 2-4 pills) every 8 hours.   Sore Throat:  See Aches and Pains meds above, also Sore throat sprays and lozenges may also help.   Cough:  Honey 2 TBS every 4-6 hours if needed.  Robitussin DM syrup or generic equivalent which has (guaifenesin = an expectorant to help you get stuff up + dextromethorphan (DM) = cough supressant). You can also get this in tablet formula (like Mucinex DM or generic equivalent).  If you have asthma or are wheezing and have a tight chest, then albuterol inhaler (Ventolin, ProAir) may be helpful - you need a prescription for this.   Congestion:  oxymetazoline (Afrin) nasal stray: 2 sprays each nostril every 12 hours. Don't use more than 7 days in a row to avoid building a tolerance to it.  Sinus rinse (neti pot) high volume sinus rinse can help open up your sinuses and be helpful, especially if you're having sinus pressure and headaches.   Other:  Umcka (pelargonium sidoides extract) can to shorten cold symptoms (can be hard to find, but [employer] carries it: brand name Umcka ColdCare from [employer]). Works best if you start taking at earliest signs of cold symptoms.  Andrographis paniculata is another herbal remedy with less evidence, but may reduce common cold symptoms in adults.  zinc acetate lozenges >= 80 mg/day reduces duration but not severity of cold symptoms in adults, but it is associated with bad taste and nausea Heated humidified air may reduce cold symptoms, so try using a  humidifier - especially in your bedroom at night.  Stay hydrated! Aim to drink at least 2 liters of water daily.

## 2022-11-28 ENCOUNTER — Other Ambulatory Visit: Payer: Self-pay

## 2022-12-01 ENCOUNTER — Telehealth: Payer: Self-pay | Admitting: Student in an Organized Health Care Education/Training Program

## 2022-12-01 DIAGNOSIS — M542 Cervicalgia: Secondary | ICD-10-CM

## 2022-12-01 NOTE — Telephone Encounter (Signed)
PT wife called stated that husband wanted to see if he can get some injections done in his neck.PT was told by [facility] to give office a call when he was ready. Please give patient a call. TY

## 2022-12-02 ENCOUNTER — Other Ambulatory Visit: Payer: Self-pay

## 2022-12-02 MED ORDER — PREDNISONE 10 MG PO TABS
ORAL_TABLET | ORAL | 0 refills | Status: DC
  Filled 2022-12-02: qty 30, 12d supply, fill #0

## 2022-12-14 ENCOUNTER — Other Ambulatory Visit: Payer: Self-pay | Admitting: Family Medicine

## 2022-12-14 ENCOUNTER — Ambulatory Visit
Admission: RE | Admit: 2022-12-14 | Discharge: 2022-12-14 | Disposition: A | Discharge status: Home / Self Care | Payer: Self-pay | Source: Ambulatory Visit | Attending: Neurosurgery | Admitting: Neurosurgery

## 2022-12-14 ENCOUNTER — Ambulatory Visit
Payer: No Typology Code available for payment source | Attending: Student in an Organized Health Care Education/Training Program | Admitting: Student in an Organized Health Care Education/Training Program

## 2022-12-14 ENCOUNTER — Encounter: Payer: Self-pay | Admitting: Student in an Organized Health Care Education/Training Program

## 2022-12-14 VITALS — BP 122/78 | HR 87 | Temp 97.2°F | Resp 14 | Ht 71.0 in | Wt 245.0 lb

## 2022-12-14 DIAGNOSIS — G894 Chronic pain syndrome: Secondary | ICD-10-CM | POA: Insufficient documentation

## 2022-12-14 DIAGNOSIS — M542 Cervicalgia: Secondary | ICD-10-CM | POA: Diagnosis not present

## 2022-12-14 DIAGNOSIS — M791 Myalgia, unspecified site: Secondary | ICD-10-CM | POA: Diagnosis not present

## 2022-12-14 DIAGNOSIS — Z049 Encounter for examination and observation for unspecified reason: Secondary | ICD-10-CM

## 2022-12-14 DIAGNOSIS — M4322 Fusion of spine, cervical region: Secondary | ICD-10-CM | POA: Diagnosis not present

## 2022-12-14 DIAGNOSIS — Q761 Klippel-Feil syndrome: Secondary | ICD-10-CM | POA: Insufficient documentation

## 2022-12-14 MED ORDER — DEXAMETHASONE SODIUM PHOSPHATE 10 MG/ML IJ SOLN
INTRAMUSCULAR | Status: AC
  Filled 2022-12-14: qty 1

## 2022-12-14 MED ORDER — ROPIVACAINE HCL 2 MG/ML IJ SOLN
INTRAMUSCULAR | Status: AC
  Filled 2022-12-14: qty 20

## 2022-12-14 MED ORDER — ROPIVACAINE HCL 2 MG/ML IJ SOLN
18.0000 mL | Freq: Once | INTRAMUSCULAR | Status: AC
  Administered 2022-12-14: 20 mL via PERINEURAL

## 2022-12-14 MED ORDER — DEXAMETHASONE SODIUM PHOSPHATE 10 MG/ML IJ SOLN
10.0000 mg | Freq: Once | INTRAMUSCULAR | Status: AC
  Administered 2022-12-14: 10 mg

## 2022-12-14 NOTE — Progress Notes (Signed)
PROVIDER NOTE: Interpretation of information contained herein should be left to medically-trained personnel. Specific patient instructions are provided elsewhere under "Patient Instructions" section of medical record. This document was created in part using STT-dictation technology, any transcriptional errors that may result from this process are unintentional.  Patient: Tony Cardenas Type: Established DOB: July 26, 1970 MRN: [id number] PCP: Dorcas Carrow, DO  Service: Procedure DOS: 12/14/2022 Setting: Ambulatory Location: Ambulatory outpatient facility Delivery: Face-to-face Provider: Edward Jolly, MD Specialty: Interventional Pain Management Specialty designation: 09 Location: Outpatient facility Ref. Prov.: Dorcas Carrow, DO    Primary Reason for Visit: Interventional Pain Management Treatment. CC: Neck Pain    Procedure:           Type: Trigger Point Injection (3+ muscle groups)          CPT: 20553 Primary Purpose: Therapeutic Cervical, Trapezius TPI Target Area: Trigger Point Approach: Percutaneous, ipsilateral approach. Laterality: Midline          1. Cervicalgia   2. Cervical fusion syndrome   3. Chronic pain syndrome    NAS-11 Pain score:   Pre-procedure: 8 /10   Post-procedure: 8 /10     Pre-op H&P Assessment:  Tony Cardenas is a 52 y.o. (year old), male patient, seen today for interventional treatment. He  has a past surgical history that includes Neck surgery (1989); Knee surgery (Right); Colonoscopy (N/A, 06/02/2014); Polypectomy (06/02/2014); Appendectomy; Esophagogastroduodenoscopy (egd) with propofol (N/A, 07/29/2016); Back surgery; Anterior cervical corpectomy (N/A, 10/10/2018); Hernia repair (over 10 years ago); Anterior cervical corpectomy (N/A, 05/01/2019); Posterior cervical fusion/foraminotomy (N/A, 05/01/2019); Colonoscopy with propofol (N/A, 11/15/2021); Spine surgery; and Cholecystectomy. Tony Cardenas has a current medication list which includes the  following prescription(s): blood glucose meter kit and supplies, freestyle libre 3 plus sensor, enbrel sureclick, ezetimibe, fluticasone, folic acid, glipizide, freestyle lite, hydrocodone-acetaminophen, [START ON 12/17/2022] hydrocodone-acetaminophen, [START ON 01/16/2023] hydrocodone-acetaminophen, hydroxychloroquine, freestyle, lisinopril, metformin, methocarbamol, methotrexate, methotrexate, omega-3 acid ethyl esters, prednisone, vitamin d (ergocalciferol), and albuterol. His primarily concern today is the Neck Pain  Initial Vital Signs:  Pulse/HCG Rate: 87  Temp: (!) 97.2 F (36.2 C) Resp: 14 BP: 122/78 SpO2: 98 %  BMI: Estimated body mass index is 34.17 kg/m as calculated from the following:   Height as of this encounter: 5\' 11"  (1.803 m).   Weight as of this encounter: 245 lb (111.1 kg).  Risk Assessment: Allergies: Reviewed. He is allergic to atorvastatin, hydromorphone hcl, and semaglutide.  Allergy Precautions: None required Coagulopathies: Reviewed. None identified.  Blood-thinner therapy: None at this time Active Infection(s): Reviewed. None identified. Tony Cardenas is afebrile  Site Confirmation: Tony Cardenas was asked to confirm the procedure and laterality before marking the site Procedure checklist: Completed Consent: Before the procedure and under the influence of no sedative(s), amnesic(s), or anxiolytics, the patient was informed of the treatment options, risks and possible complications. To fulfill our ethical and legal obligations, as recommended by the American Medical Association's Code of Ethics, I have informed the patient of my clinical impression; the nature and purpose of the treatment or procedure; the risks, benefits, and possible complications of the intervention; the alternatives, including doing nothing; the risk(s) and benefit(s) of the alternative treatment(s) or procedure(s); and the risk(s) and benefit(s) of doing nothing. The patient was provided  information about the general risks and possible complications associated with the procedure. These may include, but are not limited to: failure to achieve desired goals, infection, bleeding, organ or nerve damage, allergic reactions, paralysis, and death. In addition, the patient  was informed of those risks and complications associated to the procedure, such as failure to decrease pain; infection; bleeding; organ or nerve damage with subsequent damage to sensory, motor, and/or autonomic systems, resulting in permanent pain, numbness, and/or weakness of one or several areas of the body; allergic reactions; (i.e.: anaphylactic reaction); and/or death. Furthermore, the patient was informed of those risks and complications associated with the medications. These include, but are not limited to: allergic reactions (i.e.: anaphylactic or anaphylactoid reaction(s)); adrenal axis suppression; blood sugar elevation that in diabetics may result in ketoacidosis or comma; water retention that in patients with history of congestive heart failure may result in shortness of breath, pulmonary edema, and decompensation with resultant heart failure; weight gain; swelling or edema; medication-induced neural toxicity; particulate matter embolism and blood vessel occlusion with resultant organ, and/or nervous system infarction; and/or aseptic necrosis of one or more joints. Finally, the patient was informed that Medicine is not an exact science; therefore, there is also the possibility of unforeseen or unpredictable risks and/or possible complications that may result in a catastrophic outcome. The patient indicated having understood very clearly. We have given the patient no guarantees and we have made no promises. Enough time was given to the patient to ask questions, all of which were answered to the patient's satisfaction. Tony Cardenas has indicated that he wanted to continue with the procedure. Attestation: I, the ordering  provider, attest that I have discussed with the patient the benefits, risks, side-effects, alternatives, likelihood of achieving goals, and potential problems during recovery for the procedure that I have provided informed consent. Date  Time: 12/14/2022 10:52 AM  Pre-Procedure Preparation:  Monitoring: As per clinic protocol. Respiration, ETCO2, SpO2, BP, heart rate and rhythm monitor placed and checked for adequate function Safety Precautions: Patient was assessed for positional comfort and pressure points before starting the procedure. Time-out: I initiated and conducted the "Time-out" before starting the procedure, as per protocol. The patient was asked to participate by confirming the accuracy of the "Time Out" information. Verification of the correct person, site, and procedure were performed and confirmed by me, the nursing staff, and the patient. "Time-out" conducted as per Joint Commission's Universal Protocol (UP.01.01.01). Time: 1120  Description of Procedure:          Area Prepped: Entire             Region DuraPrep (Iodine Povacrylex [0.7% available iodine] and Isopropyl Alcohol, 74% w/w) Safety Precautions: Aspiration looking for blood return was conducted prior to all injections. At no point did we inject any substances, as a needle was being advanced. No attempts were made at seeking any paresthesias. Safe injection practices and needle disposal techniques used. Medications properly checked for expiration dates. SDV (single dose vial) medications used. Description of the Procedure: Protocol guidelines were followed. The patient was placed in position over the fluoroscopy table. The target area was identified and the area prepped in the usual manner. Skin & deeper tissues infiltrated with local anesthetic. Appropriate amount of time allowed to pass for local anesthetics to take effect. The procedure needles were then advanced to the target area. Proper needle placement secured. Negative  aspiration confirmed. Solution injected in intermittent fashion, asking for systemic symptoms every 0.5cc of injectate. The needles were then removed and the area cleansed, making sure to leave some of the prepping solution back to take advantage of its long term bactericidal properties.  Vitals:   12/14/22 1059  BP: 122/78  Pulse: 87  Resp: 14  Temp: (!) 97.2 F (36.2 C)  TempSrc: Temporal  SpO2: 98%  Weight: 245 lb (111.1 kg)  Height: 5\' 11"  (1.803 m)     Start Time: 1120 hrs. End Time: 1123 hrs. Materials:     Approximately 15 trigger points injected on each side with dry needling performed.  Each trigger point was injected with 0.5 to 1 cc of 0.2% Ropivacaine with dry needling performed.  Post-operative Assessment:  Post-procedure Vital Signs:  Pulse/HCG Rate: 87  Temp: (!) 97.2 F (36.2 C) Resp: 14 BP: 122/78 SpO2: 98 %  EBL: None  Complications: No immediate post-treatment complications observed by team, or reported by patient.  Note: The patient tolerated the entire procedure well. A repeat set of vitals were taken after the procedure and the patient was kept under observation following institutional policy, for this type of procedure. Post-procedural neurological assessment was performed, showing return to baseline, prior to discharge. The patient was provided with post-procedure discharge instructions, including a section on how to identify potential problems. Should any problems arise concerning this procedure, the patient was given instructions to immediately contact us, at any time, without hesitation. In any case, we plan to contact the patient by telephone for a follow-up status report regarding this interventional procedure.  Comments:  No additional relevant information.  Plan of Care    Chronic Opioid Analgesic:  Hydrocodone 7.5 mg QID PRN   Medications ordered for procedure: Meds ordered this encounter  Medications   ropivacaine (PF) 2 mg/mL (0.2%)  (NAROPIN) injection 18 mL   dexamethasone (DECADRON) injection 10 mg   Medications administered: We administered ropivacaine (PF) 2 mg/mL (0.2%) and dexamethasone.  See the medical record for exact dosing, route, and time of administration.  Follow-up plan:   Return for Keep sch. appt.      Recent Visits Date Type Provider Dept  11/17/22 Office Visit Edward Jolly, MD [facility]  Showing recent visits within past 90 days and meeting all other requirements Today's Visits Date Type Provider Dept  12/14/22 Procedure visit Edward Jolly, MD [facility]  Showing today's visits and meeting all other requirements Future Appointments Date Type Provider Dept  02/14/23 Appointment Edward Jolly, MD [facility]  Showing future appointments within next 90 days and meeting all other requirements  Disposition: Discharge home  Discharge (Date  Time): 12/14/2022; 1130 hrs.   Primary Care Physician: Dorcas Carrow, DO Location: [facility] Note by: Edward Jolly, MD Date: 12/14/2022; Time: 11:45 AM  Disclaimer:  Medicine is not an exact science. The only guarantee in medicine is that nothing is guaranteed. It is important to note that the decision to proceed with this intervention was based on the information collected from the patient. The Data and conclusions were drawn from the patient's questionnaire, the interview, and the physical examination. Because the information was provided in large part by the patient, it cannot be guaranteed that it has not been purposely or unconsciously manipulated. Every effort has been made to obtain as much relevant data as possible for this evaluation. It is important to note that the conclusions that lead to this procedure are derived in large part from the available data. Always take into account that the treatment will also be dependent on availability of resources and existing treatment guidelines,  considered by other Pain Management Practitioners as being common knowledge and practice, at the time of the intervention. For Medico-Legal purposes, it is also important to point out that variation in  procedural techniques and pharmacological choices are the acceptable norm. The indications, contraindications, technique, and results of the above procedure should only be interpreted and judged by a Board-Certified Interventional Pain Specialist with extensive familiarity and expertise in the same exact procedure and technique.

## 2022-12-14 NOTE — Patient Instructions (Signed)

## 2022-12-15 ENCOUNTER — Telehealth: Payer: Self-pay | Admitting: *Deleted

## 2022-12-15 ENCOUNTER — Other Ambulatory Visit: Payer: Self-pay

## 2022-12-15 NOTE — Telephone Encounter (Signed)
No problems post procedure. 

## 2022-12-16 ENCOUNTER — Other Ambulatory Visit: Payer: Self-pay

## 2022-12-16 ENCOUNTER — Telehealth: Payer: Self-pay | Admitting: Student in an Organized Health Care Education/Training Program

## 2022-12-16 NOTE — Telephone Encounter (Signed)
Patients  Pharm closed tomorrow. Called [facility] pharm to fill today. Informed patient andpharm that it will need to last 30 days. Patient with understaning

## 2022-12-16 NOTE — Telephone Encounter (Signed)
PT called stated that he will be out of medication, this weekend. Wants to see if he can get medication fill today , so he will not be out. Please give patient a call. TY
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
## 2022-12-20 ENCOUNTER — Ambulatory Visit
Admission: RE | Admit: 2022-12-20 | Discharge: 2022-12-20 | Disposition: A | Discharge status: Home / Self Care | Payer: No Typology Code available for payment source | Attending: Neurosurgery | Admitting: Neurosurgery

## 2022-12-20 ENCOUNTER — Ambulatory Visit: Payer: No Typology Code available for payment source | Admitting: Neurosurgery

## 2022-12-20 ENCOUNTER — Ambulatory Visit
Admission: RE | Admit: 2022-12-20 | Discharge: 2022-12-20 | Disposition: A | Discharge status: Home / Self Care | Payer: No Typology Code available for payment source | Source: Ambulatory Visit | Attending: Neurosurgery

## 2022-12-20 ENCOUNTER — Encounter: Payer: Self-pay | Admitting: Neurosurgery

## 2022-12-20 VITALS — BP 128/78 | Ht 71.0 in | Wt 245.0 lb

## 2022-12-20 DIAGNOSIS — Z981 Arthrodesis status: Secondary | ICD-10-CM

## 2022-12-20 DIAGNOSIS — M542 Cervicalgia: Secondary | ICD-10-CM | POA: Diagnosis not present

## 2022-12-20 DIAGNOSIS — M4312 Spondylolisthesis, cervical region: Secondary | ICD-10-CM | POA: Diagnosis not present

## 2022-12-21 ENCOUNTER — Ambulatory Visit: Payer: No Typology Code available for payment source | Admitting: Nurse Practitioner

## 2022-12-29 ENCOUNTER — Ambulatory Visit (INDEPENDENT_AMBULATORY_CARE_PROVIDER_SITE_OTHER): Payer: No Typology Code available for payment source | Admitting: Nurse Practitioner

## 2022-12-29 ENCOUNTER — Encounter: Payer: Self-pay | Admitting: Nurse Practitioner

## 2022-12-29 VITALS — BP 129/74 | HR 92 | Ht 71.0 in | Wt 249.4 lb

## 2022-12-29 DIAGNOSIS — E559 Vitamin D deficiency, unspecified: Secondary | ICD-10-CM

## 2022-12-29 DIAGNOSIS — R09A2 Foreign body sensation, throat: Secondary | ICD-10-CM | POA: Diagnosis not present

## 2022-12-29 DIAGNOSIS — E119 Type 2 diabetes mellitus without complications: Secondary | ICD-10-CM | POA: Diagnosis not present

## 2022-12-29 DIAGNOSIS — I10 Essential (primary) hypertension: Secondary | ICD-10-CM | POA: Diagnosis not present

## 2022-12-29 DIAGNOSIS — R7989 Other specified abnormal findings of blood chemistry: Secondary | ICD-10-CM

## 2022-12-29 DIAGNOSIS — E782 Mixed hyperlipidemia: Secondary | ICD-10-CM

## 2022-12-29 DIAGNOSIS — Z7984 Long term (current) use of oral hypoglycemic drugs: Secondary | ICD-10-CM | POA: Diagnosis not present

## 2022-12-29 LAB — POCT GLYCOSYLATED HEMOGLOBIN (HGB A1C): Hemoglobin A1C: 9.4 % — AB (ref 4.0–5.6)
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
## 2023-01-16 ENCOUNTER — Other Ambulatory Visit: Payer: Self-pay

## 2023-01-17 ENCOUNTER — Ambulatory Visit: Payer: No Typology Code available for payment source | Admitting: Neurosurgery

## 2023-01-17 DIAGNOSIS — Z981 Arthrodesis status: Secondary | ICD-10-CM | POA: Diagnosis not present

## 2023-01-17 DIAGNOSIS — M542 Cervicalgia: Secondary | ICD-10-CM | POA: Diagnosis not present

## 2023-01-17 NOTE — Progress Notes (Signed)
 Neurosurgery Telephone (Audio-Only) Note  Requesting Provider     Tony Duwaine SQUIBB, Cardenas [address] [place],  [place] [address] T: [phone] F: [phone]  Primary Care Provider Tony Duwaine SQUIBB, Cardenas [address] [place] [address] T: [phone] F: [phone]  Telehealth visit was conducted with Tony Cardenas, Cardenas 53 y.o. male via telephone.  History of Present Illness: Tony Cardenas 53 y.o presenting today via telephone visit to review his x-rays and response to trigger point injections. He states he underwent these injections with his pain doctor which did provide him with some relief for Cardenas couple of weeks before the pain returned to the original severity.  He states it is worse with the weather being colder.  He continues to deny pain that radiates beyond his shoulders.  12/20/2022 Tony Cardenas is Cardenas 53 y.o with Cardenas history of RA, previous smoker, DM, and multiple cervical surgeries who is here today with Cardenas chief complaint of acute on chronic neck pain.  He states that this has been going on for about Cardenas year to year and Cardenas half.  At baseline he has some discomfort in his neck and radiating pain in to his right arm that has been present since his surgery however over the last year or so he has had an increase of neck pain that radiates into his bilateral shoulders worse on the left than the right without any inciting event.  He was seen by Tony Cardenas and underwent trigger point injections about Cardenas week ago which provided him with significant relief and he now feels he is back to his baseline however he is concerned given his history for underlying hardware malfunction as he feels the exacerbation felt like his symptoms prior to surgery.  He denies any new or worsening gait disturbance or weakness.   Conservative measures:  Physical therapy: has not participated in recently Multimodal medical therapy including regular antiinflammatories: hydrocodone , robaxin , prednisone    Injections: 12/14/22: Cervical trigger point injection (Tony Cardenas) 02/16/22: Cervical trigger point injection (Tony Cardenas)   Past Surgery:  05/01/19: C3-6 PSF by Dr. Clois  10/10/18: C3-5 disectomy C4 corpectomy by Dr. Clois Neck Surgery in 1989 Back Surgery??   Tony Cardenas has no symptoms of cervical myelopathy.   The symptoms are causing Cardenas significant impact on the patient's life.   General Review of Systems:  Cardenas ROS was performed including pertinent positive and negatives as documented.  All other systems are negative.   Prior to Admission medications   Medication Sig Start Date End Date Taking? Authorizing Provider  albuterol  (VENTOLIN  HFA) 108 (90 Base) MCG/ACT inhaler Inhale 2 puffs into the lungs every 6 (six) hours as needed for wheezing or shortness of breath. 05/11/21 12/29/22  Tony Duwaine Cardenas, Cardenas  blood glucose meter kit and supplies Dispense based on patient and insurance preference. Use up to twice times daily as directed. (FOR ICD-10 E10.9, E11.9). 12/13/21   Tony Cardenas  Continuous Glucose Sensor (FREESTYLE LIBRE 3 PLUS SENSOR) MISC Use to test blood glucose five times daily Patient not taking: Reported on 12/29/2022 09/29/22     ENBREL  SURECLICK 50 MG/ML injection Inject 1 mL (50 mg total) subcutaneously once Cardenas week 10/19/22     ezetimibe  (ZETIA ) 10 MG tablet Take 1 tablet (10 mg total) by mouth daily. 09/22/22   Tony Cardenas  fluticasone  (FLONASE ) 50 MCG/ACT nasal spray PLACE 2 SPRAYS INTO BOTH NOSTRILS DAILY IN THE EVENING 06/30/20  Tony Cardenas  folic acid  (FOLVITE ) 1 MG tablet Take 1 tablet (1 mg total) by mouth once daily 03/16/22     glipiZIDE  (GLUCOTROL  XL) 5 MG 24 hr tablet Take 1 tablet (5 mg total) by mouth daily with breakfast. 09/21/22   Tony Cardenas  glucose blood (FREESTYLE LITE) test strip Use as directed 2 (two) times daily. 09/21/22   Tony Cardenas  HYDROcodone -acetaminophen  (NORCO) 7.5-325 MG tablet Take  1 tablet by mouth every 6 (six) hours as needed for moderate pain (pain score 4-6). 01/16/23 02/15/23  Tony Cardenas  hydroxychloroquine  (PLAQUENIL ) 200 MG tablet Take 1 tablet (200 mg total) by mouth 2 (two) times daily. 10/10/22     Lancets (FREESTYLE) lancets Use up to twice times daily as directed 07/21/22   Tony Cardenas  lisinopril  (ZESTRIL ) 5 MG tablet Take 1 tablet (5 mg total) by mouth daily. 11/07/22   Tony Bouchard Cardenas, Cardenas  metFORMIN  (GLUCOPHAGE -XR) 500 MG 24 hr tablet Take 2 tablets (1,000 mg total) by mouth daily with breakfast. 09/21/22 09/21/23  Tony Cardenas  methocarbamol  (ROBAXIN ) 500 MG tablet Take 1 tablet (500 mg total) by mouth every 8 (eight) hours as needed for muscle spasms. 01/27/22   Tony Cardenas  methotrexate  (RHEUMATREX) 2.5 MG tablet Take 5 tablets (12.5 mg total) by mouth every 7 (seven) days All on the same day 01/07/22     methotrexate  (RHEUMATREX) 2.5 MG tablet Take 8 tablets (20 mg total) by mouth every 7 (seven) days All on the same day 08/05/22     omega-3 acid ethyl esters (LOVAZA ) 1 g capsule Take 1 capsule (1 g total) by mouth daily. 09/22/22   Tony Cardenas  predniSONE  (DELTASONE ) 10 MG tablet Take 4 tablets (40 mg total) by mouth once daily for 3 days, THEN 3 tablets (30 mg total) once daily for 3 days, THEN 2 tablets (20 mg total) once daily for 3 days, THEN 1 tablet (10 mg total) once daily for 3 days. Patient not taking: Reported on 12/29/2022 12/02/22     Vitamin D , Ergocalciferol , (DRISDOL ) 1.25 MG (50000 UNIT) CAPS capsule Take 1 capsule (50,000 Units total) by mouth once Cardenas week. 03/18/22 03/18/23  Tony Cardenas    DATA REVIEWED    Imaging Studies  12/20/22 cervical xrays FINDINGS: No fracture or acute finding.  Slight anterolisthesis of C6 on C7.   Previous cervical fusion. Interbody fusion device extends from the lower aspect of C3 through the upper aspect of C5, surrounded by mature bone graft material. Bilateral  pedicle screws interconnecting rods extend from C3 through C6. Orthopedic hardware appears well positioned and well-seated.   Posterior fusion wires encircle the posterior elements of C1 and C2. Two of these wires are broken, but stable from prior radiographs.   No subluxation with flexion or extension.   Soft tissues are unremarkable.   IMPRESSION: 1. No fracture or acute finding. 2. Right-sided posterior wires at C1-C2 are broken but unchanged from prior radiographs. Remaining fusion hardware is well-seated well positioned and stable.     Electronically Signed   By: Alm Parkins M.D.   On: 12/28/2022 12:48  IMPRESSION  Mr. Tuch is Cardenas 53 y.o. male who I performed Cardenas telephone encounter today for evaluation and management of acute on chronic cervicalgia  PLAN  Fortunately Tony Cardenas's x-rays Cardenas not show any mechanical instability however he does have Cardenas known fracture of his wiring at  C1-2 which appears unchanged.  He also has Cardenas spondylolisthesis at C6-7.  While he is not have any mobility, does raise the question for underlying facet arthropathy.  I would like to obtain an MRI of his cervical spine to evaluate this further.  We will set him up for either Cardenas telephone visit or in person follow-up to review his MRI results once completed.  He was encouraged to call the office in the interim with any questions or concerns.  He expressed understanding and was in agreement with this plan.  No orders of the defined types were placed in this encounter.   DISPOSITION  Follow up: After cervical MRI  Edsel Jama Goods, PA Neurosurgery  TELEPHONE DOCUMENTATION   This visit was performed via telephone.  Patient location: home Provider location: office  I spent Cardenas total of 5 minutes non-face-to-face activities for this visit on the date of this encounter including review of current clinical condition and response to treatment.  The patient is aware of and accepts the limits of  this telehealth visit.

## 2023-01-18 ENCOUNTER — Ambulatory Visit: Payer: No Typology Code available for payment source | Admitting: Dietician

## 2023-01-18 DIAGNOSIS — Z7960 Long term (current) use of unspecified immunomodulators and immunosuppressants: Secondary | ICD-10-CM | POA: Diagnosis not present

## 2023-01-18 DIAGNOSIS — M0609 Rheumatoid arthritis without rheumatoid factor, multiple sites: Secondary | ICD-10-CM | POA: Diagnosis not present

## 2023-01-18 DIAGNOSIS — Q761 Klippel-Feil syndrome: Secondary | ICD-10-CM | POA: Diagnosis not present

## 2023-01-19 ENCOUNTER — Ambulatory Visit: Payer: No Typology Code available for payment source | Admitting: Nurse Practitioner

## 2023-01-24 ENCOUNTER — Telehealth: Payer: Self-pay | Admitting: Neurosurgery

## 2023-01-24 ENCOUNTER — Other Ambulatory Visit: Payer: Self-pay

## 2023-01-24 ENCOUNTER — Other Ambulatory Visit: Payer: Self-pay | Admitting: Neurosurgery

## 2023-01-24 MED ORDER — DIAZEPAM 2 MG PO TABS
2.0000 mg | ORAL_TABLET | Freq: Once | ORAL | 0 refills | Status: DC | PRN
  Filled 2023-01-24: qty 2, 1d supply, fill #0

## 2023-01-24 NOTE — Telephone Encounter (Signed)
Patient has been notified about prescription.

## 2023-01-24 NOTE — Progress Notes (Signed)
 PDMP reviewed and appropriate.  Valium sent in to take as needed prior to upcoming MRI.

## 2023-01-24 NOTE — Telephone Encounter (Signed)
 Mri cervical tomorrow, can he have valium to get through the scan. He is feeling very anxious.  [facility]

## 2023-01-25 ENCOUNTER — Ambulatory Visit
Admission: RE | Admit: 2023-01-25 | Discharge: 2023-01-25 | Disposition: A | Discharge status: Home / Self Care | Payer: No Typology Code available for payment source | Source: Ambulatory Visit | Attending: Neurosurgery | Admitting: Neurosurgery

## 2023-01-25 DIAGNOSIS — M47812 Spondylosis without myelopathy or radiculopathy, cervical region: Secondary | ICD-10-CM | POA: Diagnosis not present

## 2023-01-25 DIAGNOSIS — Z981 Arthrodesis status: Secondary | ICD-10-CM | POA: Diagnosis not present

## 2023-01-25 DIAGNOSIS — M5023 Other cervical disc displacement, cervicothoracic region: Secondary | ICD-10-CM | POA: Diagnosis not present

## 2023-01-25 DIAGNOSIS — M542 Cervicalgia: Secondary | ICD-10-CM | POA: Insufficient documentation

## 2023-01-25 DIAGNOSIS — M4802 Spinal stenosis, cervical region: Secondary | ICD-10-CM | POA: Diagnosis not present

## 2023-01-25 DIAGNOSIS — M50223 Other cervical disc displacement at C6-C7 level: Secondary | ICD-10-CM | POA: Diagnosis not present

## 2023-01-27 ENCOUNTER — Encounter: Payer: Self-pay | Admitting: Family Medicine

## 2023-01-27 ENCOUNTER — Encounter: Payer: Self-pay | Admitting: Neurosurgery

## 2023-01-27 DIAGNOSIS — M2550 Pain in unspecified joint: Secondary | ICD-10-CM

## 2023-02-05 ENCOUNTER — Other Ambulatory Visit: Payer: Self-pay | Admitting: Family Medicine

## 2023-02-05 ENCOUNTER — Other Ambulatory Visit: Payer: Self-pay

## 2023-02-06 ENCOUNTER — Other Ambulatory Visit: Payer: Self-pay

## 2023-02-07 ENCOUNTER — Ambulatory Visit (INDEPENDENT_AMBULATORY_CARE_PROVIDER_SITE_OTHER): Payer: No Typology Code available for payment source | Admitting: Neurosurgery

## 2023-02-07 ENCOUNTER — Other Ambulatory Visit: Payer: Self-pay

## 2023-02-07 DIAGNOSIS — M542 Cervicalgia: Secondary | ICD-10-CM | POA: Diagnosis not present

## 2023-02-07 DIAGNOSIS — Z981 Arthrodesis status: Secondary | ICD-10-CM

## 2023-02-07 DIAGNOSIS — M47812 Spondylosis without myelopathy or radiculopathy, cervical region: Secondary | ICD-10-CM | POA: Diagnosis not present

## 2023-02-07 MED FILL — Lisinopril Tab 5 MG: ORAL | 90 days supply | Qty: 90 | Fill #0 | Status: AC

## 2023-02-07 NOTE — Telephone Encounter (Signed)
Requested Prescriptions  Pending Prescriptions Disp Refills   lisinopril (ZESTRIL) 5 MG tablet 90 tablet 0    Sig: Take 1 tablet (5 mg total) by mouth daily.     Cardiovascular:  ACE Inhibitors Failed - 02/07/2023  4:54 PM      Failed - Cr in normal range and within 180 days    Creatinine  Date Value Ref Range Status  02/11/2013 0.82 0.60 - 1.30 mg/dL Final   Creatinine, Ser  Date Value Ref Range Status  09/22/2022 0.57 (L) 0.76 - 1.27 mg/dL Final         Passed - K in normal range and within 180 days    Potassium  Date Value Ref Range Status  09/22/2022 4.2 3.5 - 5.2 mmol/L Final  02/11/2013 3.9 3.5 - 5.1 mmol/L Final         Passed - Patient is not pregnant      Passed - Last BP in normal range    BP Readings from Last 1 Encounters:  12/29/22 129/74         Passed - Valid encounter within last 6 months    Recent Outpatient Visits           2 months ago Acute cough   [facility] Jackolyn Confer, MD   4 months ago Routine general medical examination at a health care facility   [facility], [place] P, DO   7 months ago Type 2 diabetes mellitus with hyperglycemia, without long-term current use of insulin [facility])   [place] [facility] [place], Megan P, DO   8 months ago Acute non-recurrent maxillary sinusitis   [place] [facility] [place], Megan P, DO   9 months ago Sore throat   [place] [facility] [place], Oralia Rud, DO       Future Appointments             In 1 month Laural Benes, Oralia Rud, DO [place] [facility]

## 2023-02-07 NOTE — Progress Notes (Signed)
Neurosurgery Telephone (Audio-Only) Note  Requesting Provider     Dorcas Carrow, DO [address] [place],  [place] [address] T: [phone] F: [phone]  Primary Care Provider Dorcas Carrow, DO [address] [place] [address] T: [phone] F: [phone]  Telehealth visit was conducted with Towanda Malkin, a 53 y.o. male via telephone.   History of Present Illness: Mr. Efferson is a 53 y.o presenting today via telephone visit to review his MRI results.   01/17/23 Mr. Stief a 53 y.o presenting today via telephone visit to review his x-rays and response to trigger point injections. He states he underwent these injections with his pain doctor which did provide him with some relief for a couple of weeks before the pain returned to the original severity.  He states it is worse with the weather being colder.  He continues to deny pain that radiates beyond his shoulders.   12/20/2022 Mr. Donaldson Richter is a 53 y.o with a history of RA, previous smoker, DM, and multiple cervical surgeries who is here today with a chief complaint of acute on chronic neck pain.  He states that this has been going on for about a year to year and a half.  At baseline he has some discomfort in his neck and radiating pain in to his right arm that has been present since his surgery however over the last year or so he has had an increase of neck pain that radiates into his bilateral shoulders worse on the left than the right without any inciting event.  He was seen by Dr. Cherylann Ratel and underwent trigger point injections about a week ago which provided him with significant relief and he now feels he is back to his baseline however he is concerned given his history for underlying hardware malfunction as he feels the exacerbation felt like his symptoms prior to surgery.  He denies any new or worsening gait disturbance or weakness.   Conservative measures:  Physical therapy: has not participated in  recently Multimodal medical therapy including regular antiinflammatories: hydrocodone, robaxin, prednisone  Injections: 12/14/22: Cervical trigger point injection (Dr. Cherylann Ratel) 02/16/22: Cervical trigger point injection (Dr. Cherylann Ratel)   Past Surgery:  05/01/19: C3-6 PSF by Dr. Myer Haff  10/10/18: C3-5 disectomy C4 corpectomy by Dr. Myer Haff Neck Surgery in 1989 Back Surgery??   ALBARAA SWINGLE has no symptoms of cervical myelopathy.   The symptoms are causing a significant impact on the patient's life.   General Review of Systems:  A ROS was performed including pertinent positive and negatives as documented.  All other systems are negative.   Prior to Admission medications   Medication Sig Start Date End Date Taking? Authorizing Provider  albuterol (VENTOLIN HFA) 108 (90 Base) MCG/ACT inhaler Inhale 2 puffs into the lungs every 6 (six) hours as needed for wheezing or shortness of breath. 05/11/21 12/29/22  Olevia Perches P, DO  blood glucose meter kit and supplies Dispense based on patient and insurance preference. Use up to twice times daily as directed. (FOR ICD-10 E10.9, E11.9). 12/13/21   Dani Gobble, NP  Continuous Glucose Sensor (FREESTYLE LIBRE 3 PLUS SENSOR) MISC Use to test blood glucose five times daily Patient not taking: Reported on 12/29/2022 09/29/22     diazepam (VALIUM) 2 MG tablet Take 1 tablet (2 mg total) by mouth once as needed for up to 2 doses for anxiety. Take 1 tab 30-45 minutes prior to MRI. Ok to repeat x 1. 01/24/23  Susanne Borders, PA  ENBREL SURECLICK 50 MG/ML injection Inject 1 mL (50 mg total) subcutaneously once a week 10/19/22     ezetimibe (ZETIA) 10 MG tablet Take 1 tablet (10 mg total) by mouth daily. 09/22/22   Johnson, Megan P, DO  fluticasone (FLONASE) 50 MCG/ACT nasal spray PLACE 2 SPRAYS INTO BOTH NOSTRILS DAILY IN THE EVENING 06/30/20   Olevia Perches P, DO  folic acid (FOLVITE) 1 MG tablet Take 1 tablet (1 mg total) by mouth once daily 03/16/22      glipiZIDE (GLUCOTROL XL) 5 MG 24 hr tablet Take 1 tablet (5 mg total) by mouth daily with breakfast. 09/21/22   Dani Gobble, NP  glucose blood (FREESTYLE LITE) test strip Use as directed 2 (two) times daily. 09/21/22   Dani Gobble, NP  HYDROcodone-acetaminophen (NORCO) 7.5-325 MG tablet Take 1 tablet by mouth every 6 (six) hours as needed for moderate pain (pain score 4-6). 01/16/23 02/15/23  Edward Jolly, MD  hydroxychloroquine (PLAQUENIL) 200 MG tablet Take 1 tablet (200 mg total) by mouth 2 (two) times daily. 10/10/22     Lancets (FREESTYLE) lancets Use up to twice times daily as directed 07/21/22   Dani Gobble, NP  lisinopril (ZESTRIL) 5 MG tablet Take 1 tablet (5 mg total) by mouth daily. 11/07/22   Laural Benes, Megan P, DO  metFORMIN (GLUCOPHAGE-XR) 500 MG 24 hr tablet Take 2 tablets (1,000 mg total) by mouth daily with breakfast. 09/21/22 09/21/23  Dani Gobble, NP  methocarbamol (ROBAXIN) 500 MG tablet Take 1 tablet (500 mg total) by mouth every 8 (eight) hours as needed for muscle spasms. 01/27/22   Edward Jolly, MD  methotrexate (RHEUMATREX) 2.5 MG tablet Take 5 tablets (12.5 mg total) by mouth every 7 (seven) days All on the same day 01/07/22     methotrexate (RHEUMATREX) 2.5 MG tablet Take 8 tablets (20 mg total) by mouth every 7 (seven) days All on the same day 08/05/22     omega-3 acid ethyl esters (LOVAZA) 1 g capsule Take 1 capsule (1 g total) by mouth daily. 09/22/22   Johnson, Megan P, DO  predniSONE (DELTASONE) 10 MG tablet Take 4 tablets (40 mg total) by mouth once daily for 3 days, THEN 3 tablets (30 mg total) once daily for 3 days, THEN 2 tablets (20 mg total) once daily for 3 days, THEN 1 tablet (10 mg total) once daily for 3 days. Patient not taking: Reported on 12/29/2022 12/02/22     Vitamin D, Ergocalciferol, (DRISDOL) 1.25 MG (50000 UNIT) CAPS capsule Take 1 capsule (50,000 Units total) by mouth once a week. 03/18/22 03/18/23  Olevia Perches P, DO    DATA  REVIEWED    Imaging Studies  01/25/23 MRI C spine IMPRESSION: 1. Prior C4 corpectomy. Anterior cervical fusion from C4 through C6. Posterior cervical fusion from C3 through C6. 2. At C3-4 there  moderate bilateral foraminal narrowing. 3. At C4-5 there is moderate left and severe right foraminal stenosis. 4. At C6-7 there is a mild disc bulge. Moderate right foraminal stenosis. 5. At C7-T1 there is a mild disc bulge with a tiny left paracentral disc protrusion. Mild bilateral facet arthropathy. Moderate bilateral foraminal stenosis. 6. No acute osseous injury of the cervical spine.     Electronically Signed   By: Elige Ko M.D.   On: 02/03/2023 07:54  IMPRESSION  Mr. Defelice is a 53 y.o. male who I performed a telephone encounter today for evaluation and management of: Cervicalgia  PLAN  Mr. Maffett is a pleasant 53 y.o presenting with recurrent acute on chronic neck pain and bilateraly shoulder pain. He reports some intermittent numbness into his hands R>L when sleeping. We discussed his MRI results which does show some foraminal stenosis however the majority of his pain is isolated to his neck and shoulders. We briefly discussed whether or not his shoulders could be the cause of his symptoms but he continues to report that his symptoms are the same as they were prior to his previous neck surgery and he does not feel this is an underlying shoulder problem. I recommended that he complete PT and have places a referral to [facility] at his request. I will review his imaging with Dr. Myer Haff as well and reach out to him via mychart with the results of this conversation,   No orders of the defined types were placed in this encounter.   DISPOSITION  Follow up: In person appointment in  after PT  Neurosurgery  Susanne Borders, PA   TELEPHONE DOCUMENTATION   This visit was performed via telephone.  Patient location: home Provider location: office  I spent a total of 8  minutes non-face-to-face activities for this visit on the date of this encounter including review of current clinical condition and response to treatment.  The patient is aware of and accepts the limits of this telehealth visit.

## 2023-02-08 ENCOUNTER — Other Ambulatory Visit: Payer: Self-pay

## 2023-02-08 DIAGNOSIS — M542 Cervicalgia: Secondary | ICD-10-CM

## 2023-02-08 DIAGNOSIS — M47812 Spondylosis without myelopathy or radiculopathy, cervical region: Secondary | ICD-10-CM

## 2023-02-09 NOTE — Telephone Encounter (Signed)
[employer] asking for their fax number.

## 2023-02-14 ENCOUNTER — Ambulatory Visit
Payer: No Typology Code available for payment source | Attending: Student in an Organized Health Care Education/Training Program | Admitting: Student in an Organized Health Care Education/Training Program

## 2023-02-14 ENCOUNTER — Other Ambulatory Visit: Payer: Self-pay

## 2023-02-14 ENCOUNTER — Encounter: Payer: Self-pay | Admitting: Student in an Organized Health Care Education/Training Program

## 2023-02-14 VITALS — BP 148/83 | HR 83 | Temp 97.3°F | Resp 16 | Ht 71.0 in | Wt 249.0 lb

## 2023-02-14 DIAGNOSIS — Q761 Klippel-Feil syndrome: Secondary | ICD-10-CM | POA: Diagnosis not present

## 2023-02-14 DIAGNOSIS — M5412 Radiculopathy, cervical region: Secondary | ICD-10-CM | POA: Diagnosis not present

## 2023-02-14 DIAGNOSIS — G894 Chronic pain syndrome: Secondary | ICD-10-CM | POA: Diagnosis not present

## 2023-02-14 DIAGNOSIS — M542 Cervicalgia: Secondary | ICD-10-CM | POA: Insufficient documentation

## 2023-02-14 MED ORDER — HYDROCODONE-ACETAMINOPHEN 7.5-325 MG PO TABS
1.0000 | ORAL_TABLET | Freq: Four times a day (QID) | ORAL | 0 refills | Status: AC | PRN
  Filled 2023-02-14: qty 120, 30d supply, fill #0

## 2023-02-14 MED ORDER — HYDROCODONE-ACETAMINOPHEN 7.5-325 MG PO TABS
1.0000 | ORAL_TABLET | Freq: Four times a day (QID) | ORAL | 0 refills | Status: AC | PRN
  Filled 2023-03-16: qty 120, 30d supply, fill #0

## 2023-02-14 MED ORDER — HYDROCODONE-ACETAMINOPHEN 7.5-325 MG PO TABS
1.0000 | ORAL_TABLET | Freq: Four times a day (QID) | ORAL | 0 refills | Status: DC | PRN
  Filled 2023-04-14: qty 120, 30d supply, fill #0

## 2023-02-14 NOTE — Patient Instructions (Signed)
 GENERAL RISKS AND COMPLICATIONS  What are the risk, side effects and possible complications? Generally speaking, most procedures are safe.  However, with any procedure there are risks, side effects, and the possibility of complications.  The risks and complications are dependent upon the sites that are lesioned, or the type of nerve block to be performed.  The closer the procedure is to the spine, the more serious the risks are.  Great care is taken when placing the radio frequency needles, block needles or lesioning probes, but sometimes complications can occur. Infection: Any time there is an injection through the skin, there is a risk of infection.  This is why sterile conditions are used for these blocks.  There are four possible types of infection. Localized skin infection. Central Nervous System Infection-This can be in the form of Meningitis, which can be deadly. Epidural Infections-This can be in the form of an epidural abscess, which can cause pressure inside of the spine, causing compression of the spinal cord with subsequent paralysis. This would require an emergency surgery to decompress, and there are no guarantees that the patient would recover from the paralysis. Discitis-This is an infection of the intervertebral discs.  It occurs in about 1% of discography procedures.  It is difficult to treat and it may lead to surgery.        2. Pain: the needles have to go through skin and soft tissues, will cause soreness.       3. Damage to internal structures:  The nerves to be lesioned may be near blood vessels or    other nerves which can be potentially damaged.       4. Bleeding: Bleeding is more common if the patient is taking blood thinners such as  aspirin, Coumadin, Ticiid, Plavix, etc., or if he/she have some genetic predisposition  such as hemophilia. Bleeding into the spinal canal can cause compression of the spinal  cord with subsequent paralysis.  This would require an emergency  surgery to  decompress and there are no guarantees that the patient would recover from the  paralysis.       5. Pneumothorax:  Puncturing of a lung is a possibility, every time a needle is introduced in  the area of the chest or upper back.  Pneumothorax refers to free air around the  collapsed lung(s), inside of the thoracic cavity (chest cavity).  Another two possible  complications related to a similar event would include: Hemothorax and Chylothorax.   These are variations of the Pneumothorax, where instead of air around the collapsed  lung(s), you may have blood or chyle, respectively.       6. Spinal headaches: They may occur with any procedures in the area of the spine.       7. Persistent CSF (Cerebro-Spinal Fluid) leakage: This is a rare problem, but may occur  with prolonged intrathecal or epidural catheters either due to the formation of a fistulous  track or a dural tear.       8. Nerve damage: By working so close to the spinal cord, there is always a possibility of  nerve damage, which could be as serious as a permanent spinal cord injury with  paralysis.       9. Death:  Although rare, severe deadly allergic reactions known as "Anaphylactic  reaction" can occur to any of the medications used.      10. Worsening of the symptoms:  We can always make thing worse.  What are the chances  of something like this happening? Chances of any of this occuring are extremely low.  By statistics, you have more of a chance of getting killed in a motor vehicle accident: while driving to the hospital than any of the above occurring .  Nevertheless, you should be aware that they are possibilities.  In general, it is similar to taking a shower.  Everybody knows that you can slip, hit your head and get killed.  Does that mean that you should not shower again?  Nevertheless always keep in mind that statistics do not mean anything if you happen to be on the wrong side of them.  Even if a procedure has a 1 (one) in a  1,000,000 (million) chance of going wrong, it you happen to be that one..Also, keep in mind that by statistics, you have more of a chance of having something go wrong when taking medications.  Who should not have this procedure? If you are on a blood thinning medication (e.g. Coumadin, Plavix, see list of "Blood Thinners"), or if you have an active infection going on, you should not have the procedure.  If you are taking any blood thinners, please inform your physician.  How should I prepare for this procedure? Do not eat or drink anything at least six hours prior to the procedure. Bring a driver with you .  It cannot be a taxi. Come accompanied by an adult that can drive you back, and that is strong enough to help you if your legs get weak or numb from the local anesthetic. Take all of your medicines the morning of the procedure with just enough water to swallow them. If you have diabetes, make sure that you are scheduled to have your procedure done first thing in the morning, whenever possible. If you have diabetes, take only half of your insulin dose and notify our nurse that you have done so as soon as you arrive at the clinic. If you are diabetic, but only take blood sugar pills (oral hypoglycemic), then do not take them on the morning of your procedure.  You may take them after you have had the procedure. Do not take aspirin or any aspirin-containing medications, at least eleven (11) days prior to the procedure.  They may prolong bleeding. Wear loose fitting clothing that may be easy to take off and that you would not mind if it got stained with Betadine or blood. Do not wear any jewelry or perfume Remove any nail coloring.  It will interfere with some of our monitoring equipment.  NOTE: Remember that this is not meant to be interpreted as a complete list of all possible complications.  Unforeseen problems may occur.  BLOOD THINNERS The following drugs contain aspirin or other products,  which can cause increased bleeding during surgery and should not be taken for 2 weeks prior to and 1 week after surgery.  If you should need take something for relief of minor pain, you may take acetaminophen which is found in Tylenol,m Datril, Anacin-3 and Panadol. It is not blood thinner. The products listed below are.  Do not take any of the products listed below in addition to any listed on your instruction sheet.  A.P.C or A.P.C with Codeine Codeine Phosphate Capsules #3 Ibuprofen Ridaura  ABC compound Congesprin Imuran rimadil  Advil Cope Indocin Robaxisal  Alka-Seltzer Effervescent Pain Reliever and Antacid Coricidin or Coricidin-D  Indomethacin Rufen  Alka-Seltzer plus Cold Medicine Cosprin Ketoprofen S-A-C Tablets  Anacin Analgesic Tablets or Capsules Coumadin  Korlgesic Salflex  Anacin Extra Strength Analgesic tablets or capsules CP-2 Tablets Lanoril Salicylate  Anaprox Cuprimine Capsules Levenox Salocol  Anexsia-D Dalteparin Magan Salsalate  Anodynos Darvon compound Magnesium Salicylate Sine-off  Ansaid Dasin Capsules Magsal Sodium Salicylate  Anturane Depen Capsules Marnal Soma  APF Arthritis pain formula Dewitt's Pills Measurin Stanback  Argesic Dia-Gesic Meclofenamic Sulfinpyrazone  Arthritis Bayer Timed Release Aspirin Diclofenac Meclomen Sulindac  Arthritis pain formula Anacin Dicumarol Medipren Supac  Analgesic (Safety coated) Arthralgen Diffunasal Mefanamic Suprofen  Arthritis Strength Bufferin Dihydrocodeine Mepro Compound Suprol  Arthropan liquid Dopirydamole Methcarbomol with Aspirin Synalgos  ASA tablets/Enseals Disalcid Micrainin Tagament  Ascriptin Doan's Midol Talwin  Ascriptin A/D Dolene Mobidin Tanderil  Ascriptin Extra Strength Dolobid Moblgesic Ticlid  Ascriptin with Codeine Doloprin or Doloprin with Codeine Momentum Tolectin  Asperbuf Duoprin Mono-gesic Trendar  Aspergum Duradyne Motrin or Motrin IB Triminicin  Aspirin plain, buffered or enteric coated  Durasal Myochrisine Trigesic  Aspirin Suppositories Easprin Nalfon Trillsate  Aspirin with Codeine Ecotrin Regular or Extra Strength Naprosyn Uracel  Atromid-S Efficin Naproxen Ursinus  Auranofin Capsules Elmiron Neocylate Vanquish  Axotal Emagrin Norgesic Verin  Azathioprine Empirin or Empirin with Codeine Normiflo Vitamin E  Azolid Emprazil Nuprin Voltaren  Bayer Aspirin plain, buffered or children's or timed BC Tablets or powders Encaprin Orgaran Warfarin Sodium  Buff-a-Comp Enoxaparin Orudis Zorpin  Buff-a-Comp with Codeine Equegesic Os-Cal-Gesic   Buffaprin Excedrin plain, buffered or Extra Strength Oxalid   Bufferin Arthritis Strength Feldene Oxphenbutazone   Bufferin plain or Extra Strength Feldene Capsules Oxycodone with Aspirin   Bufferin with Codeine Fenoprofen Fenoprofen Pabalate or Pabalate-SF   Buffets II Flogesic Panagesic   Buffinol plain or Extra Strength Florinal or Florinal with Codeine Panwarfarin   Buf-Tabs Flurbiprofen Penicillamine   Butalbital Compound Four-way cold tablets Penicillin   Butazolidin Fragmin Pepto-Bismol   Carbenicillin Geminisyn Percodan   Carna Arthritis Reliever Geopen Persantine   Carprofen Gold's salt Persistin   Chloramphenicol Goody's Phenylbutazone   Chloromycetin Haltrain Piroxlcam   Clmetidine heparin Plaquenil   Cllnoril Hyco-pap Ponstel   Clofibrate Hydroxy chloroquine Propoxyphen         Before stopping any of these medications, be sure to consult the physician who ordered them.  Some, such as Coumadin (Warfarin) are ordered to prevent or treat serious conditions such as "deep thrombosis", "pumonary embolisms", and other heart problems.  The amount of time that you may need off of the medication may also vary with the medication and the reason for which you were taking it.  If you are taking any of these medications, please make sure you notify your pain physician before you undergo any procedures.         Epidural Steroid  Injection Patient Information  Description: The epidural space surrounds the nerves as they exit the spinal cord.  In some patients, the nerves can be compressed and inflamed by a bulging disc or a tight spinal canal (spinal stenosis).  By injecting steroids into the epidural space, we can bring irritated nerves into direct contact with a potentially helpful medication.  These steroids act directly on the irritated nerves and can reduce swelling and inflammation which often leads to decreased pain.  Epidural steroids may be injected anywhere along the spine and from the neck to the low back depending upon the location of your pain.   After numbing the skin with local anesthetic (like Novocaine), a small needle is passed into the epidural space slowly.  You may experience a sensation of pressure while this  is being done.  The entire block usually last less than 10 minutes.  Conditions which may be treated by epidural steroids:  Low back and leg pain Neck and arm pain Spinal stenosis Post-laminectomy syndrome Herpes zoster (shingles) pain Pain from compression fractures  Preparation for the injection:  Do not eat any solid food or dairy products within 8 hours of your appointment.  You may drink clear liquids up to 3 hours before appointment.  Clear liquids include water, black coffee, juice or soda.  No milk or cream please. You may take your regular medication, including pain medications, with a sip of water before your appointment  Diabetics should hold regular insulin (if taken separately) and take 1/2 normal NPH dos the morning of the procedure.  Carry some sugar containing items with you to your appointment. A driver must accompany you and be prepared to drive you home after your procedure.  Bring all your current medications with your. An IV may be inserted and sedation may be given at the discretion of the physician.   A blood pressure cuff, EKG and other monitors will often be applied  during the procedure.  Some patients may need to have extra oxygen administered for a short period. You will be asked to provide medical information, including your allergies, prior to the procedure.  We must know immediately if you are taking blood thinners (like Coumadin/Warfarin)  Or if you are allergic to IV iodine contrast (dye). We must know if you could possible be pregnant.  Possible side-effects: Bleeding from needle site Infection (rare, may require surgery) Nerve injury (rare) Numbness & tingling (temporary) Difficulty urinating (rare, temporary) Spinal headache ( a headache worse with upright posture) Light -headedness (temporary) Pain at injection site (several days) Decreased blood pressure (temporary) Weakness in arm/leg (temporary) Pressure sensation in back/neck (temporary)  Call if you experience: Fever/chills associated with headache or increased back/neck pain. Headache worsened by an upright position. New onset weakness or numbness of an extremity below the injection site Hives or difficulty breathing (go to the emergency room) Inflammation or drainage at the infection site Severe back/neck pain Any new symptoms which are concerning to you  Please note:  Although the local anesthetic injected can often make your back or neck feel good for several hours after the injection, the pain will likely return.  It takes 3-7 days for steroids to work in the epidural space.  You may not notice any pain relief for at least that one week.  If effective, we will often do a series of three injections spaced 3-6 weeks apart to maximally decrease your pain.  After the initial series, we generally will wait several months before considering a repeat injection of the same type.  If you have any questions, please call [phone] [facility]

## 2023-02-14 NOTE — Progress Notes (Signed)
Nursing Pain Medication Assessment:  Safety precautions to be maintained throughout the outpatient stay will include: orient to surroundings, keep bed in low position, maintain call bell within reach at all times, provide assistance with transfer out of bed and ambulation.  Medication Inspection Compliance: Pill count conducted under aseptic conditions, in front of the patient. Neither the pills nor the bottle was removed from the patient's sight at any time. Once count was completed pills were immediately returned to the patient in their original bottle.  Medication: Hydrocodone/APAP Pill/Patch Count:  7 of 120 pills remain Pill/Patch Appearance: Markings consistent with prescribed medication Bottle Appearance: Standard pharmacy container. Clearly labeled. Filled Date: 1 / 6 / 2025 Last Medication intake:  Today

## 2023-02-14 NOTE — Progress Notes (Signed)
PROVIDER NOTE: Information contained herein reflects review and annotations entered in association with encounter. Interpretation of such information and data should be left to medically-trained personnel. Information provided to patient can be located elsewhere in the medical record under "Patient Instructions". Document created using STT-dictation technology, any transcriptional errors that may result from process are unintentional.    Patient: Tony Cardenas  Service Category: E/M  Provider: Edward Jolly, MD  DOB: 05-22-1970  DOS: 02/14/2023  Referring Provider: Dorcas Carrow, DO  MRN: [id number]  Specialty: Interventional Pain Management  PCP: Dorcas Carrow, DO  Type: Established Patient  Setting: Ambulatory outpatient    Location: Office  Delivery: Face-to-face     HPI  [name], a 54 y.o. year old male, is here today because of his Cervical radicular pain [M54.12]. Tony Cardenas primary complain today is Neck Pain and Shoulder Pain (B/l)  Pertinent problems: Tony Cardenas has S/P cervical spinal fusion; Cervical radicular pain; Cervical fusion syndrome; Chronic pain syndrome; and Cervical spondylosis with myelopathy and radiculopathy on their pertinent problem list. Pain Assessment: Severity of Chronic pain is reported as a 9 /10. Location: Neck Posterior/through shoulders, down ars to fingers bilat; LEFT side worse. Onset: More than a month ago. Quality: Aching, Burning. Timing: Constant. Modifying factor(s): meds. Vitals:  height is 5\' 11"  (1.803 m) and weight is 249 lb (112.9 kg). His temperature is 97.3 F (36.3 C) (abnormal). His blood pressure is 148/83 (abnormal) and his pulse is 83. His respiration is 16 and oxygen saturation is 99%.  BMI: Estimated body mass index is 34.73 kg/m as calculated from the following:   Height as of this encounter: 5\' 11"  (1.803 m).   Weight as of this encounter: 249 lb (112.9 kg). Last encounter: 11/17/2022. Last procedure:  12/14/2022.  Reason for encounter:   History of Present Illness   [name] is a 53 year old male with bulging cervical discs who presents with shoulder and arm pain.  He has been experiencing pain in his shoulders and arms for the past year, attributed to bulging cervical discs identified on a recent MRI. The pain radiates from his neck into both arms, with the left arm being more affected than the right. It is persistent, with some days being worse than others, and he has difficulty raising his arm beyond a certain point.  He underwent surgeries in 2020 and 2021. He has not started physical therapy due to insurance constraints. Previous trigger point injections provided temporary  relief for about one to two weeks, but the pain persists, affecting his sleep and daily activities.  He is currently taking hydrocodone for pain management.   In terms of lifestyle, he tries to stay active by engaging in light activities such as raking leaves or cleaning, which helps manage the pain. The pain worsens when he is inactive, likened to a 'toothache' that intensifies when he stops moving.       Pharmacotherapy Assessment  Analgesic: Hydrocodone 7.5 mg QID PRN   Monitoring: [place] PMP: PDMP reviewed during this encounter.       Pharmacotherapy: No side-effects or adverse reactions reported. Compliance: No problems identified. Effectiveness: Clinically acceptable.  Nonah Mattes, RN  02/14/2023  8:08 AM  Sign when Signing Visit Nursing Pain Medication Assessment:  Safety precautions to be maintained throughout the outpatient stay will include: orient to surroundings, keep bed in low position, maintain call bell within reach at all times, provide assistance with transfer out of bed and  ambulation.  Medication Inspection Compliance: Pill count conducted under aseptic conditions, in front of the patient. Neither the pills nor the bottle was removed from the patient's sight at any time. Once  count was completed pills were immediately returned to the patient in their original bottle.  Medication: Hydrocodone/APAP Pill/Patch Count:  7 of 120 pills remain Pill/Patch Appearance: Markings consistent with prescribed medication Bottle Appearance: Standard pharmacy container. Clearly labeled. Filled Date: 1 / 6 / 2025 Last Medication intake:  Today  No results found for: "CBDTHCR" No results found for: "D8THCCBX" No results found for: "D9THCCBX"  UDS:  Summary  Date Value Ref Range Status  01/27/2022 Note  Final    Comment:    ==================================================================== [employer] Select 13 (MW) ==================================================================== Test                             Result       Flag       Units  Drug Present and Declared for Prescription Verification   Hydrocodone                    2440         EXPECTED   ng/mg creat   Hydromorphone                  1400         EXPECTED   ng/mg creat   Dihydrocodeine                 320          EXPECTED   ng/mg creat   Norhydrocodone                 1114         EXPECTED   ng/mg creat    Sources of hydrocodone include scheduled prescription medications.    Hydromorphone, dihydrocodeine and norhydrocodone are expected    metabolites of hydrocodone. Hydromorphone and dihydrocodeine are    also available as scheduled prescription medications.  ==================================================================== Test                      Result    Flag   Units      Ref Range   Creatinine              50               mg/dL      >=16 ==================================================================== Declared Medications:  The flagging and interpretation on this report are based on the  following declared medications.  Unexpected results may arise from  inaccuracies in the declared medications.   **Note: The testing scope of this panel includes these medications:   Hydrocodone  (Norco)   **Note: The testing scope of this panel does not include the  following reported medications:   Acetaminophen (Norco)  Albuterol (Ventolin HFA)  Evolocumab (Repatha)  Ezetimibe (Zetia)  Fluticasone (Flonase)  Folic Acid  Lisinopril (Zestril)  Metformin (Glucophage)  Methocarbamol (Robaxin)  Methotrexate  Omega-3 Fatty Acids  Pravastatin (Pravachol)  Vitamin D2 (Drisdol) ==================================================================== For clinical consultation, please call [phone]. ====================================================================       ROS  Constitutional: Denies any fever or chills Gastrointestinal: No reported hemesis, hematochezia, vomiting, or acute GI distress Musculoskeletal:  as above Neurological: No reported episodes of acute onset apraxia, aphasia, dysarthria, agnosia, amnesia, paralysis, loss of coordination, or loss of consciousness  Medication Review  FreeStyle Freedom Lite, FreeStyle Libre 3 Sensor, HYDROcodone-acetaminophen, Vitamin D (Ergocalciferol), albuterol, diazepam, etanercept, ezetimibe, fluticasone, folic acid, freestyle, glipiZIDE, glucose blood, hydroxychloroquine, lisinopril, metFORMIN, methocarbamol, methotrexate, omega-3 acid ethyl esters, and predniSONE  History Review  Allergy: Tony Cardenas is allergic to atorvastatin, hydromorphone hcl, and semaglutide. Drug: Tony Cardenas  reports no history of drug use. Alcohol:  reports no history of alcohol use. Tobacco:  reports that he quit smoking about 3 years ago. His smoking use included cigarettes. He started smoking about 23 years ago. He has a 20 pack-year smoking history. He quit smokeless tobacco use about 6 years ago.  His smokeless tobacco use included chew. Social: Tony Cardenas  reports that he quit smoking about 3 years ago. His smoking use included cigarettes. He started smoking about 23 years ago. He has a 20 pack-year smoking history. He quit smokeless  tobacco use about 6 years ago.  His smokeless tobacco use included chew. He reports that he does not drink alcohol and does not use drugs. Medical:  has a past medical history of Arthritis, COPD (chronic obstructive pulmonary disease) (HCC), Diabetes mellitus without complication (HCC), GERD (gastroesophageal reflux disease), Hyperlipidemia, Hypertension, and Neck pain (1989). Surgical: Tony Cardenas  has a past surgical history that includes Neck surgery (1989); Knee surgery (Right); Colonoscopy (N/A, 06/02/2014); Polypectomy (06/02/2014); Appendectomy; Esophagogastroduodenoscopy (egd) with propofol (N/A, 07/29/2016); Back surgery; Anterior cervical corpectomy (N/A, 10/10/2018); Hernia repair (over 10 years ago); Anterior cervical corpectomy (N/A, 05/01/2019); Posterior cervical fusion/foraminotomy (N/A, 05/01/2019); Colonoscopy with propofol (N/A, 11/15/2021); Spine surgery; and Cholecystectomy. Family: family history includes Alcohol abuse in his father; COPD in his father; Cancer in his mother; Diabetes in his brother, father, and paternal grandmother.  Laboratory Chemistry Profile   Renal Lab Results  Component Value Date   BUN 7 09/22/2022   CREATININE 0.57 (L) 09/22/2022   BCR 12 09/22/2022   GFRAA 114 12/16/2019   GFRNONAA >60 11/11/2021    Hepatic Lab Results  Component Value Date   AST 14 09/22/2022   ALT 15 09/22/2022   ALBUMIN 4.3 09/22/2022   ALKPHOS 58 09/22/2022   HCVAB NON REACTIVE 03/24/2020    Electrolytes Lab Results  Component Value Date   NA 137 09/22/2022   K 4.2 09/22/2022   CL 101 09/22/2022   CALCIUM 8.7 09/22/2022    Bone Lab Results  Component Value Date   VD25OH 23.1 (L) 09/02/2021    Inflammation (CRP: Acute Phase) (ESR: Chronic Phase) Lab Results  Component Value Date   CRP 0.7 05/26/2020   ESRSEDRATE 31 (H) 11/11/2021         Note: Above Lab results reviewed.  Recent Imaging Review  MR CERVICAL SPINE WO CONTRAST CLINICAL DATA:  Neck pain,  surgery 3 years ago, bilateral shoulder pain extending to the Cardenas for 3-4 months  EXAM: MRI CERVICAL SPINE WITHOUT CONTRAST  TECHNIQUE: Multiplanar, multisequence MR imaging of the cervical spine was performed. No intravenous contrast was administered.  COMPARISON:  06/16/2017  FINDINGS: Alignment: 1-2 mm anterolisthesis of C6 on C7. Loss of the normal cervical lordosis with straightening. Mild relative kyphosis at C7-T1.  Vertebrae: No acute fracture, evidence of discitis, or aggressive bone lesion.  Cord: Normal signal and morphology.  Posterior Fossa, vertebral arteries, paraspinal tissues: Posterior fossa demonstrates no focal abnormality. Vertebral artery flow voids are maintained. Paraspinal soft tissues are unremarkable.  Disc levels:  Discs: Prior C4 corpectomy. Anterior cervical fusion from C4 through C6. Posterior cervical fusion from C3 through C6. mild disc height loss  at C6-7 and C7-T1.  C2-3: No disc protrusion. Mild bilateral facet arthropathy. Mild right foraminal narrowing. No left foraminal narrowing. No central canal stenosis.  C3-4: Interbody fusion. Moderate bilateral foraminal narrowing. No central canal stenosis.  C4-5: Interbody fusion. Moderate left and severe right foraminal stenosis. No central canal stenosis.  C5-6: Interbody fusion. No left foraminal stenosis. Mild right foraminal stenosis. No central canal stenosis.  C6-7: Mild disc bulge. Moderate right foraminal stenosis. No left foraminal stenosis. No central canal stenosis.  C7-T1: Mild disc bulge with a tiny left paracentral disc protrusion. Mild bilateral facet arthropathy. Moderate bilateral foraminal stenosis. No central canal stenosis.  T1-2: Mild disc bulge. Mild bilateral foraminal narrowing. Mild right foraminal narrowing. No left foraminal narrowing. No central canal stenosis.  IMPRESSION: 1. Prior C4 corpectomy. Anterior cervical fusion from C4 through  C6. Posterior cervical fusion from C3 through C6. 2. At C3-4 there  moderate bilateral foraminal narrowing. 3. At C4-5 there is moderate left and severe right foraminal stenosis. 4. At C6-7 there is a mild disc bulge. Moderate right foraminal stenosis. 5. At C7-T1 there is a mild disc bulge with a tiny left paracentral disc protrusion. Mild bilateral facet arthropathy. Moderate bilateral foraminal stenosis. 6. No acute osseous injury of the cervical spine.  Electronically Signed   By: Elige Ko M.D.   On: 02/03/2023 07:54 Note: Reviewed        Physical Exam  General appearance: Well nourished, well developed, and well hydrated. In no apparent acute distress Mental status: Alert, oriented x 3 (person, place, & time)       Respiratory: No evidence of acute respiratory distress Eyes: PERLA Vitals: BP (!) 148/83   Pulse 83   Temp (!) 97.3 F (36.3 C)   Resp 16   Ht 5\' 11"  (1.803 m)   Wt 249 lb (112.9 kg)   SpO2 99%   BMI 34.73 kg/m  BMI: Estimated body mass index is 34.73 kg/m as calculated from the following:   Height as of this encounter: 5\' 11"  (1.803 m).   Weight as of this encounter: 249 lb (112.9 kg). Ideal: Ideal body weight: 75.3 kg (166 lb 0.1 oz) Adjusted ideal body weight: 90.4 kg (199 lb 3.3 oz)  Cervical Spine Area Exam  Skin & Axial Inspection: No masses, redness, edema, swelling, or associated skin lesions Alignment: Symmetrical Functional ROM: Pain restricted ROM, bilaterally Stability: No instability detected Muscle Tone/Strength: Functionally intact. No obvious neuro-muscular anomalies detected. Sensory (Neurological): Neurogenic pain pattern Palpation: No palpable anomalies             Upper Extremity (UE) Exam    Side: Right upper extremity  Side: Left upper extremity  Skin & Extremity Inspection: Skin color, temperature, and hair growth are WNL. No peripheral edema or cyanosis. No masses, redness, swelling, asymmetry, or associated skin lesions. No  contractures.  Skin & Extremity Inspection: Skin color, temperature, and hair growth are WNL. No peripheral edema or cyanosis. No masses, redness, swelling, asymmetry, or associated skin lesions. No contractures.  Functional ROM: Unrestricted ROM          Functional ROM: Unrestricted ROM          Muscle Tone/Strength: Functionally intact. No obvious neuro-muscular anomalies detected.  Muscle Tone/Strength: Functionally intact. No obvious neuro-muscular anomalies detected.  Sensory (Neurological): Unimpaired          Sensory (Neurological): Unimpaired          Palpation: No palpable anomalies  Palpation: No palpable anomalies              Provocative Test(s):  Phalen's test: deferred Tinel's test: deferred Apley's scratch test (touch opposite shoulder):  Action 1 (Across chest): deferred Action 2 (Overhead): deferred Action 3 (LB reach): deferred   Provocative Test(s):  Phalen's test: deferred Tinel's test: deferred Apley's scratch test (touch opposite shoulder):  Action 1 (Across chest): deferred Action 2 (Overhead): deferred Action 3 (LB reach): deferred     Assessment   Diagnosis Status  1. Cervical radicular pain   2. Cervicalgia   3. Cervical fusion syndrome   4. Chronic pain syndrome    Having a Flare-up Having a Flare-up Controlled   Updated Problems: No problems updated.  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 The pain is severe, affecting daily activities and sleep. Previous trigger point injections provided temporary relief. Discussed the potential benefits of an epidural steroid injection for reducing nerve root inflammation, which may provide longer-lasting relief compared to trigger point injections. He prefers the epidural injection without sedation. There is no current use  of anticoagulants. Diabetes is well-controlled. Plan to schedule an epidural steroid injection,  physical therapy, and continue hydrocodone for pain management. Follow-up after physical therapy to assess progress and discuss further treatment options.  Cervicalgia: repeat cervical TPI PRN  Type 2 Diabetes Mellitus   Diabetes is well-controlled on metformin. There is no current use of insulin. Continue metformin and monitor blood glucose levels regularly.  General Health Maintenance   He is maintaining an exercise routine and monitoring his diet. Weight has plateaued at the desired level. Encourage continued physical activity and a healthy diet. Monitor weight and adjust the exercise routine as needed.  Follow-up   A follow-up appointment is scheduled in two weeks. Update the urine screen today for MM.      Tony Cardenas has a current medication list which includes the following long-term medication(s): albuterol, enbrel sureclick, ezetimibe, fluticasone, glipizide, lisinopril, metformin, and omega-3 acid ethyl esters.  Pharmacotherapy (Medications Ordered): Meds ordered this encounter  Medications   HYDROcodone-acetaminophen (NORCO) 7.5-325 MG tablet    Sig: Take 1 tablet by mouth every 6 (six) hours as needed for moderate pain (pain score 4-6).    Dispense:  120 tablet    Refill:  0   HYDROcodone-acetaminophen (NORCO) 7.5-325 MG tablet    Sig: Take 1 tablet by mouth every 6 (six) hours as needed for moderate pain (pain score 4-6).    Dispense:  120 tablet    Refill:  0   HYDROcodone-acetaminophen (NORCO) 7.5-325 MG tablet    Sig: Take 1 tablet by mouth every 6 (six) hours as needed for moderate pain (pain score 4-6).    Dispense:  120 tablet    Refill:  0   Orders:  Orders Placed This Encounter  Procedures   Cervical Epidural Injection    Sedation: Patient's choice. Purpose: Diagnostic/Therapeutic Indication(s): Radiculitis and cervicalgia associater with cervical  degenerative disc disease.    Standing Status:   Future    Expiration Date:   05/14/2023    Scheduling Instructions:     Procedure: Cervical Epidural Steroid Injection/Block     Level(s): C7-T1     Laterality: TBD     Timeframe: As soon as schedule allows  Where will this procedure be performed?:   [facility]             Caydan Mctavish   [employer] Select 13 (MW), Urine    Volume: 30 ml(s). Minimum 3 ml of urine is needed. Document temperature of fresh sample. Indications: Long term (current) use of opiate analgesic (G29.528)    Release to patient:   Immediate   Follow-up plan:   Return in about 13 days (around 02/27/2023) for C-ESI, in clinic [facility].      Recent Visits Date Type Provider Dept  12/14/22 Procedure visit Edward Jolly, MD [facility]  11/17/22 Office Visit Edward Jolly, MD [facility]  Showing recent visits within past 90 days and meeting all other requirements Today's Visits Date Type Provider Dept  02/14/23 Office Visit Edward Jolly, MD [facility]  Showing today's visits and meeting all other requirements Future Appointments Date Type Provider Dept  05/11/23 Appointment Edward Jolly, MD [facility]  Showing future appointments within next 90 days and meeting all other requirements  I discussed the assessment and treatment plan with the patient. The patient was provided an opportunity to ask questions and all were answered. The patient agreed with the plan and demonstrated an understanding of the instructions.  Patient advised to call back or seek an in-person evaluation if the symptoms or condition worsens.  Duration of encounter: .  Total time on encounter, as per AMA guidelines included both the face-to-face and non-face-to-face time personally spent by the physician and/or other qualified health care professional(s) on the day of the encounter (includes time in activities that require the physician or other  qualified health care professional and does not include time in activities normally performed by clinical staff). Physician's time may include the following activities when performed: Preparing to see the patient (e.g., pre-charting review of records, searching for previously ordered imaging, lab work, and nerve conduction tests) Review of prior analgesic pharmacotherapies. Reviewing PMP Interpreting ordered tests (e.g., lab work, imaging, nerve conduction tests) Performing post-procedure evaluations, including interpretation of diagnostic procedures Obtaining and/or reviewing separately obtained history Performing a medically appropriate examination and/or evaluation Counseling and educating the patient/family/caregiver Ordering medications, tests, or procedures Referring and communicating with other health care professionals (when not separately reported) Documenting clinical information in the electronic or other health record Independently interpreting results (not separately reported) and communicating results to the patient/ family/caregiver Care coordination (not separately reported)  Note by: Edward Jolly, MD Date: 02/14/2023; Time: 8:34 AM

## 2023-02-16 ENCOUNTER — Ambulatory Visit: Payer: No Typology Code available for payment source | Attending: Neurosurgery

## 2023-02-16 DIAGNOSIS — M25512 Pain in left shoulder: Secondary | ICD-10-CM | POA: Insufficient documentation

## 2023-02-16 DIAGNOSIS — M47812 Spondylosis without myelopathy or radiculopathy, cervical region: Secondary | ICD-10-CM | POA: Diagnosis not present

## 2023-02-16 DIAGNOSIS — M25511 Pain in right shoulder: Secondary | ICD-10-CM | POA: Diagnosis not present

## 2023-02-16 DIAGNOSIS — M542 Cervicalgia: Secondary | ICD-10-CM | POA: Insufficient documentation

## 2023-02-16 LAB — TOXASSURE SELECT 13 (MW), URINE
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
## 2023-02-20 ENCOUNTER — Ambulatory Visit: Payer: No Typology Code available for payment source

## 2023-02-20 DIAGNOSIS — M25511 Pain in right shoulder: Secondary | ICD-10-CM

## 2023-02-20 DIAGNOSIS — M542 Cervicalgia: Secondary | ICD-10-CM
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
## 2023-02-22 ENCOUNTER — Ambulatory Visit: Payer: No Typology Code available for payment source | Admitting: Physical Therapy
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
## 2023-02-27 ENCOUNTER — Ambulatory Visit: Payer: No Typology Code available for payment source | Admitting: Physical Therapy

## 2023-02-27 ENCOUNTER — Telehealth: Payer: Self-pay
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
## 2023-02-27 NOTE — Telephone Encounter (Signed)
 Patient was identified as falling into the [place] Measure - Diabetes.   Patient was: Appointment scheduled with primary care provider in the next 30 days.

## 2023-03-01 ENCOUNTER — Ambulatory Visit
Payer: No Typology Code available for payment source | Attending: Student in an Organized Health Care Education/Training Program | Admitting: Student in an Organized Health Care Education/Training Program

## 2023-03-01 ENCOUNTER — Ambulatory Visit
Admission: RE | Admit: 2023-03-01 | Discharge: 2023-03-01 | Disposition: A | Discharge status: Home / Self Care | Payer: No Typology Code available for payment source | Source: Ambulatory Visit | Attending: Student in an Organized Health Care Education/Training Program | Admitting: Student in an Organized Health Care Education/Training Program

## 2023-03-01 ENCOUNTER — Encounter: Payer: Self-pay | Admitting: Student in an Organized Health Care Education/Training Program

## 2023-03-01 DIAGNOSIS — G894 Chronic pain syndrome: Secondary | ICD-10-CM | POA: Diagnosis not present

## 2023-03-01 DIAGNOSIS — M5412 Radiculopathy, cervical region: Secondary | ICD-10-CM | POA: Diagnosis not present

## 2023-03-01 MED ORDER — ROPIVACAINE HCL 2 MG/ML IJ SOLN
1.0000 mL | Freq: Once | INTRAMUSCULAR | Status: AC
Start: 2023-03-01 — End: 2023-03-01
  Administered 2023-03-01: 1 mL via EPIDURAL

## 2023-03-01 MED ORDER — SODIUM CHLORIDE 0.9% FLUSH
1.0000 mL | Freq: Once | INTRAVENOUS | Status: AC
  Administered 2023-03-01: 1 mL

## 2023-03-01 MED ORDER — DEXAMETHASONE SODIUM PHOSPHATE 10 MG/ML IJ SOLN
10.0000 mg | Freq: Once | INTRAMUSCULAR | Status: AC
  Administered 2023-03-01: 10 mg

## 2023-03-01 MED ORDER — LIDOCAINE HCL 2 % IJ SOLN
20.0000 mL | Freq: Once | INTRAMUSCULAR | Status: AC
  Administered 2023-03-01: 400 mg

## 2023-03-01 MED ORDER — IOHEXOL 180 MG/ML  SOLN
10.0000 mL | Freq: Once | INTRAMUSCULAR | Status: AC
  Administered 2023-03-01: 10 mL via EPIDURAL

## 2023-03-01 NOTE — Progress Notes (Signed)
 Safety precautions to be maintained throughout the outpatient stay will include: orient to surroundings, keep bed in low position, maintain call bell within reach at all times, provide assistance with transfer out of bed and ambulation.

## 2023-03-01 NOTE — Progress Notes (Signed)
PROVIDER NOTE: Interpretation of information contained herein should be left to medically-trained personnel. Specific patient instructions are provided elsewhere under "Patient Instructions" section of medical record. This document was created in part using STT-dictation technology, any transcriptional errors that may result from this process are unintentional.  Patient: Tony Cardenas Type: Established DOB: 1970-11-26 MRN: [id number] PCP: Dorcas Carrow, DO  Service: Procedure DOS: 03/01/2023 Setting: Ambulatory Location: Ambulatory outpatient facility Delivery: Face-to-face Provider: Edward Jolly, MD Specialty: Interventional Pain Management Specialty designation: 09 Location: Outpatient facility Ref. Prov.: Edward Jolly, MD       Interventional Therapy   Procedure: Cervical Epidural Steroid injection (CESI) (Interlaminar) #1  Laterality: Midline  Level: C7-T1 Imaging: Fluoroscopy-assisted DOS: 03/01/2023  Performed by: Edward Jolly, MD Anesthesia: Local anesthesia (1-2% Lidocaine) Sedation: No Sedation                         Purpose: Diagnostic/Therapeutic Indications: Cervicalgia, cervical radicular pain, degenerative disc disease, severe enough to impact quality of life or function. 1. Cervical radicular pain   2. Chronic pain syndrome    NAS-11 score:   Pre-procedure: 9 /10   Post-procedure: 4  (moving and putting on shirt)/10      Position  Prep  Materials:  Location setting: Procedure suite Position: Prone, on modified reverse trendelenburg to facilitate breathing, with head in head-cradle. Pillows positioned under chest (below chin-level) with cervical spine flexed. Safety Precautions: Patient was assessed for positional comfort and pressure points before starting the procedure. Prepping solution: DuraPrep (Iodine Povacrylex [0.7% available iodine] and Isopropyl Alcohol, 74% w/w) Prep Area: Entire  cervicothoracic region Approach: percutaneous,  paramedial Intended target: Posterior cervical epidural space Materials Procedure:  Tray: Epidural Needle(s): Epidural (Tuohy) Qty: 1 Length: (90mm) 3.5-inch Gauge: 22G   H&P (Pre-op Assessment):  Tony Cardenas is a 53 y.o. (year old), male patient, seen today for interventional treatment. He  has a past surgical history that includes Neck surgery (1989); Knee surgery (Right); Colonoscopy (N/A, 06/02/2014); Polypectomy (06/02/2014); Appendectomy; Esophagogastroduodenoscopy (egd) with propofol (N/A, 07/29/2016); Back surgery; Anterior cervical corpectomy (N/A, 10/10/2018); Hernia repair (over 10 years ago); Anterior cervical corpectomy (N/A, 05/01/2019); Posterior cervical fusion/foraminotomy (N/A, 05/01/2019); Colonoscopy with propofol (N/A, 11/15/2021); Spine surgery; and Cholecystectomy. Tony Cardenas has a current medication list which includes the following prescription(s): blood glucose meter kit and supplies, freestyle libre 3 plus sensor, diazepam, enbrel sureclick, ezetimibe, fluticasone, folic acid, glipizide, freestyle lite, hydrocodone-acetaminophen, [START ON 03/16/2023] hydrocodone-acetaminophen, [START ON 04/15/2023] hydrocodone-acetaminophen, hydroxychloroquine, freestyle, lisinopril, metformin, methocarbamol, methotrexate, methotrexate, omega-3 acid ethyl esters, prednisone, vitamin d (ergocalciferol), and albuterol. His primarily concern today is the Neck Pain (Neck and bilateraly shoulders)  Initial Vital Signs:  Pulse/HCG Rate: 93  Temp: 98.4 F (36.9 C) Resp: 18 BP: 137/83 SpO2: 97 %  BMI: Estimated body mass index is 32.78 kg/m as calculated from the following:   Height as of this encounter: 5\' 11"  (1.803 m).   Weight as of this encounter: 235 lb (106.6 kg).  Risk Assessment: Allergies: Reviewed. He is allergic to atorvastatin, hydromorphone hcl, and semaglutide.  Allergy Precautions: None required Coagulopathies: Reviewed. None identified.  Blood-thinner therapy: None at  this time Active Infection(s): Reviewed. None identified. Tony Cardenas is afebrile  Site Confirmation: Tony Cardenas was asked to confirm the procedure and laterality before marking the site Procedure checklist: Completed Consent: Before the procedure and under the influence of no sedative(s), amnesic(s), or anxiolytics, the patient was informed of the treatment options, risks and possible complications.  To fulfill our ethical and legal obligations, as recommended by the American Medical Association's Code of Ethics, I have informed the patient of my clinical impression; the nature and purpose of the treatment or procedure; the risks, benefits, and possible complications of the intervention; the alternatives, including doing nothing; the risk(s) and benefit(s) of the alternative treatment(s) or procedure(s); and the risk(s) and benefit(s) of doing nothing. The patient was provided information about the general risks and possible complications associated with the procedure. These may include, but are not limited to: failure to achieve desired goals, infection, bleeding, organ or nerve damage, allergic reactions, paralysis, and death. In addition, the patient was informed of those risks and complications associated to Spine-related procedures, such as failure to decrease pain; infection (i.e.: Meningitis, epidural or intraspinal abscess); bleeding (i.e.: epidural hematoma, subarachnoid hemorrhage, or any other type of intraspinal or peri-dural bleeding); organ or nerve damage (i.e.: Any type of peripheral nerve, nerve root, or spinal cord injury) with subsequent damage to sensory, motor, and/or autonomic systems, resulting in permanent pain, numbness, and/or weakness of one or several areas of the body; allergic reactions; (i.e.: anaphylactic reaction); and/or death. Furthermore, the patient was informed of those risks and complications associated with the medications. These include, but are not limited to:  allergic reactions (i.e.: anaphylactic or anaphylactoid reaction(s)); adrenal axis suppression; blood sugar elevation that in diabetics may result in ketoacidosis or comma; water retention that in patients with history of congestive heart failure may result in shortness of breath, pulmonary edema, and decompensation with resultant heart failure; weight gain; swelling or edema; medication-induced neural toxicity; particulate matter embolism and blood vessel occlusion with resultant organ, and/or nervous system infarction; and/or aseptic necrosis of one or more joints. Finally, the patient was informed that Medicine is not an exact science; therefore, there is also the possibility of unforeseen or unpredictable risks and/or possible complications that may result in a catastrophic outcome. The patient indicated having understood very clearly. We have given the patient no guarantees and we have made no promises. Enough time was given to the patient to ask questions, all of which were answered to the patient's satisfaction. Tony Cardenas has indicated that he wanted to continue with the procedure. Attestation: I, the ordering provider, attest that I have discussed with the patient the benefits, risks, side-effects, alternatives, likelihood of achieving goals, and potential problems during recovery for the procedure that I have provided informed consent. Date  Time: 03/01/2023 10:32 AM   Pre-Procedure Preparation:  Monitoring: As per clinic protocol. Respiration, ETCO2, SpO2, BP, heart rate and rhythm monitor placed and checked for adequate function Safety Precautions: Patient was assessed for positional comfort and pressure points before starting the procedure. Time-out: I initiated and conducted the "Time-out" before starting the procedure, as per protocol. The patient was asked to participate by confirming the accuracy of the "Time Out" information. Verification of the correct person, site, and procedure were  performed and confirmed by me, the nursing staff, and the patient. "Time-out" conducted as per Joint Commission's Universal Protocol (UP.01.01.01). Time: 1047 Start Time: 1047 hrs.  Description  Narrative of Procedure:          Rationale (medical necessity): procedure needed and proper for the diagnosis and/or treatment of the patient's medical symptoms and needs. Start Time: 1047 hrs. Safety Precautions: Aspiration looking for blood return was conducted prior to all injections. At no point did we inject any substances, as a needle was being advanced. No attempts were made at seeking any paresthesias.  Safe injection practices and needle disposal techniques used. Medications properly checked for expiration dates. SDV (single dose vial) medications used. Description of procedure: Protocol guidelines were followed. The patient was assisted into a comfortable position. The target area was identified and the area prepped in the usual manner. Skin & deeper tissues infiltrated with local anesthetic. Appropriate amount of time allowed to pass for local anesthetics to take effect. Using fluoroscopic guidance, the epidural needle was introduced through the skin, ipsilateral to the reported pain, and advanced to the target area. Posterior laminar os was contacted and the needle walked caudad, until the lamina was cleared. The ligamentum flavum was engaged and the epidural space identified using "loss-of-resistance technique" with 2-3 ml of PF-NaCl (0.9% NSS), in a 5cc dedicated LOR syringe. (See "Imaging guidance" below for use of contrast details.) Once proper needle placement was secured, and negative aspiration confirmed, the solution was injected in intermittent fashion, asking for systemic symptoms every 0.5cc. The needles were then removed and the area cleansed, making sure to leave some of the prepping solution back to take advantage of its long term bactericidal properties.  Vitals:   03/01/23 1035 03/01/23  1045 03/01/23 1050 03/01/23 1054  BP: 137/83 (!) 143/84 (!) 143/90 (!) 141/88  Pulse: 93 92 89 90  Resp:  18 16 15   Temp: 98.4 F (36.9 C)     SpO2: 97% 98% 96% 97%  Weight:      Height:         End Time: 1052 hrs.  Imaging Guidance (Spinal):          Type of Imaging Technique: Fluoroscopy Guidance (Spinal) Indication(s): Fluoroscopy guidance for needle placement to enhance accuracy in procedures requiring precise needle localization for targeted delivery of medication in or near specific anatomical locations not easily accessible without such real-time imaging assistance. Exposure Time: Please see nurses notes. Contrast: Before injecting any contrast, we confirmed that the patient did not have an allergy to iodine, shellfish, or radiological contrast. Once satisfactory needle placement was completed at the desired level, radiological contrast was injected. Contrast injected under live fluoroscopy. No contrast complications. See chart for type and volume of contrast used. Fluoroscopic Guidance: I was personally present during the use of fluoroscopy. "Tunnel Vision Technique" used to obtain the best possible view of the target area. Parallax error corrected before commencing the procedure. "Direction-depth-direction" technique used to introduce the needle under continuous pulsed fluoroscopy. Once target was reached, antero-posterior, oblique, and lateral fluoroscopic projection used confirm needle placement in all planes. Images permanently stored in EMR. Interpretation: I personally interpreted the imaging intraoperatively. Adequate needle placement confirmed in multiple planes. Appropriate spread of contrast into desired area was observed. No evidence of afferent or efferent intravascular uptake. No intrathecal or subarachnoid spread observed. Permanent images saved into the patient's record.  Post-operative Assessment:  Post-procedure Vital Signs:  Pulse/HCG Rate: 90  Temp: 98.4 F (36.9  C) Resp: 15 BP: (!) 141/88 SpO2: 97 %  EBL: None  Complications: No immediate post-treatment complications observed by team, or reported by patient.  Note: The patient tolerated the entire procedure well. A repeat set of vitals were taken after the procedure and the patient was kept under observation following institutional policy, for this type of procedure. Post-procedural neurological assessment was performed, showing return to baseline, prior to discharge. The patient was provided with post-procedure discharge instructions, including a section on how to identify potential problems. Should any problems arise concerning this procedure, the patient was given instructions to  immediately contact us, at any time, without hesitation. In any case, we plan to contact the patient by telephone for a follow-up status report regarding this interventional procedure.  Comments:  No additional relevant information. 5 out of 5 strength bilateral upper extremity: Shoulder abduction, elbow flexion, elbow extension, thumb extension.   Plan of Care (POC)  Orders:  Orders Placed This Encounter  Procedures   DG PAIN CLINIC C-ARM 1-60 MIN NO REPORT    Intraoperative interpretation by procedural physician at [facility].    Standing Status:   Standing    Number of Occurrences:   1    Reason for exam::   Assistance in needle guidance and placement for procedures requiring needle placement in or near specific anatomical locations not easily accessible without such assistance.   Chronic Opioid Analgesic:  Hydrocodone 7.5 mg QID PRN   Medications ordered for procedure: Meds ordered this encounter  Medications   iohexol (OMNIPAQUE) 180 MG/ML injection 10 mL    Must be Myelogram-compatible. If not available, you may substitute with a water-soluble, non-ionic, hypoallergenic, myelogram-compatible radiological contrast medium.   lidocaine (XYLOCAINE) 2 % (with pres) injection 400 mg   ropivacaine  (PF) 2 mg/mL (0.2%) (NAROPIN) injection 1 mL   sodium chloride flush (NS) 0.9 % injection 1 mL   dexamethasone (DECADRON) injection 10 mg   Medications administered: We administered iohexol, lidocaine, ropivacaine (PF) 2 mg/mL (0.2%), sodium chloride flush, and dexamethasone.  See the medical record for exact dosing, route, and time of administration.  Follow-up plan:   Return for Keep sch. appt.      Recent Visits Date Type Provider Dept  02/14/23 Office Visit Edward Jolly, MD [facility]  12/14/22 Procedure visit Edward Jolly, MD [facility]  Showing recent visits within past 90 days and meeting all other requirements Today's Visits Date Type Provider Dept  03/01/23 Procedure visit Edward Jolly, MD [facility]  Showing today's visits and meeting all other requirements Future Appointments Date Type Provider Dept  05/11/23 Appointment Edward Jolly, MD [facility]  Showing future appointments within next 90 days and meeting all other requirements  Disposition: Discharge home  Discharge (Date  Time): 03/01/2023; 1056 hrs.   Primary Care Physician: Dorcas Carrow, DO Location: [facility] Note by: Edward Jolly, MD (TTS technology used. I apologize for any typographical errors that were not detected and corrected.) Date: 03/01/2023; Time: 10:57 AM  Disclaimer:  Medicine is not an Visual merchandiser. The only guarantee in medicine is that nothing is guaranteed. It is important to note that the decision to proceed with this intervention was based on the information collected from the patient. The Data and conclusions were drawn from the patient's questionnaire, the interview, and the physical examination. Because the information was provided in large part by the patient, it cannot be guaranteed that it has not been purposely or unconsciously manipulated. Every effort has been made to obtain as much relevant data as  possible for this evaluation. It is important to note that the conclusions that lead to this procedure are derived in large part from the available data. Always take into account that the treatment will also be dependent on availability of resources and existing treatment guidelines, considered by other Pain Management Practitioners as being common knowledge and practice, at the time of the intervention. For Medico-Legal purposes, it is also important to point out that variation in procedural techniques and pharmacological choices are the acceptable norm. The  indications, contraindications, technique, and results of the above procedure should only be interpreted and judged by a Board-Certified Interventional Pain Specialist with extensive familiarity and expertise in the same exact procedure and technique.

## 2023-03-01 NOTE — Patient Instructions (Signed)

## 2023-03-02 ENCOUNTER — Encounter: Payer: No Typology Code available for payment source | Admitting: Physical Therapy

## 2023-03-02 ENCOUNTER — Telehealth: Payer: Self-pay | Admitting: *Deleted

## 2023-03-02 NOTE — Telephone Encounter (Signed)
 No problems post procedure.

## 2023-03-06 ENCOUNTER — Encounter: Payer: No Typology Code available for payment source | Admitting: Physical Therapy

## 2023-03-08 ENCOUNTER — Encounter: Payer: No Typology Code available for payment source | Admitting: Physical Therapy

## 2023-03-13 ENCOUNTER — Other Ambulatory Visit: Payer: Self-pay | Admitting: Family Medicine

## 2023-03-13 ENCOUNTER — Encounter: Payer: No Typology Code available for payment source | Admitting: Physical Therapy

## 2023-03-13 ENCOUNTER — Other Ambulatory Visit: Payer: Self-pay | Admitting: Neurosurgery

## 2023-03-13 DIAGNOSIS — M542 Cervicalgia: Secondary | ICD-10-CM

## 2023-03-13 DIAGNOSIS — Z981 Arthrodesis status: Secondary | ICD-10-CM

## 2023-03-13 DIAGNOSIS — M47812 Spondylosis without myelopathy or radiculopathy, cervical region: Secondary | ICD-10-CM

## 2023-03-14 ENCOUNTER — Other Ambulatory Visit: Payer: Self-pay

## 2023-03-15 ENCOUNTER — Encounter: Payer: No Typology Code available for payment source | Admitting: Physical Therapy

## 2023-03-16 ENCOUNTER — Other Ambulatory Visit: Payer: Self-pay

## 2023-03-17 ENCOUNTER — Ambulatory Visit
Admission: RE | Admit: 2023-03-17 | Discharge: 2023-03-17 | Disposition: A | Discharge status: Home / Self Care | Source: Ambulatory Visit | Attending: Neurosurgery | Admitting: Neurosurgery

## 2023-03-17 DIAGNOSIS — Z981 Arthrodesis status: Secondary | ICD-10-CM | POA: Diagnosis not present

## 2023-03-17 DIAGNOSIS — M4312 Spondylolisthesis, cervical region: Secondary | ICD-10-CM | POA: Diagnosis not present

## 2023-03-17 DIAGNOSIS — M4802 Spinal stenosis, cervical region: Secondary | ICD-10-CM | POA: Diagnosis not present

## 2023-03-17 DIAGNOSIS — M47812 Spondylosis without myelopathy or radiculopathy, cervical region: Secondary | ICD-10-CM | POA: Insufficient documentation

## 2023-03-17 DIAGNOSIS — M542 Cervicalgia: Secondary | ICD-10-CM | POA: Diagnosis not present

## 2023-03-17 DIAGNOSIS — Z4789 Encounter for other orthopedic aftercare: Secondary | ICD-10-CM | POA: Diagnosis not present

## 2023-03-20 ENCOUNTER — Encounter: Payer: No Typology Code available for payment source | Admitting: Physical Therapy

## 2023-03-22 ENCOUNTER — Other Ambulatory Visit: Payer: Self-pay

## 2023-03-22 ENCOUNTER — Ambulatory Visit (INDEPENDENT_AMBULATORY_CARE_PROVIDER_SITE_OTHER): Payer: Self-pay | Admitting: Family Medicine

## 2023-03-22 ENCOUNTER — Encounter: Payer: Self-pay | Admitting: Family Medicine

## 2023-03-22 VITALS — BP 132/79 | HR 83 | Ht 71.0 in | Wt 254.4 lb

## 2023-03-22 DIAGNOSIS — E559 Vitamin D deficiency, unspecified: Secondary | ICD-10-CM | POA: Diagnosis not present

## 2023-03-22 DIAGNOSIS — J449 Chronic obstructive pulmonary disease, unspecified: Secondary | ICD-10-CM | POA: Diagnosis not present

## 2023-03-22 DIAGNOSIS — F322 Major depressive disorder, single episode, severe without psychotic features: Secondary | ICD-10-CM | POA: Diagnosis not present

## 2023-03-22 DIAGNOSIS — R7989 Other specified abnormal findings of blood chemistry: Secondary | ICD-10-CM

## 2023-03-22 DIAGNOSIS — Z7985 Long-term (current) use of injectable non-insulin antidiabetic drugs: Secondary | ICD-10-CM

## 2023-03-22 DIAGNOSIS — I10 Essential (primary) hypertension: Secondary | ICD-10-CM

## 2023-03-22 DIAGNOSIS — E782 Mixed hyperlipidemia: Secondary | ICD-10-CM | POA: Diagnosis not present

## 2023-03-22 DIAGNOSIS — E1165 Type 2 diabetes mellitus with hyperglycemia: Secondary | ICD-10-CM

## 2023-03-22 DIAGNOSIS — D473 Essential (hemorrhagic) thrombocythemia: Secondary | ICD-10-CM | POA: Diagnosis not present

## 2023-03-22 LAB — BAYER DCA HB A1C WAIVED: HB A1C (BAYER DCA - WAIVED): 8.1 % — ABNORMAL HIGH (ref 4.8–5.6)

## 2023-03-22 MED ORDER — EZETIMIBE 10 MG PO TABS
10.0000 mg | ORAL_TABLET | Freq: Every day | ORAL | 1 refills | Status: DC
  Filled 2023-03-22 – 2023-05-28 (×2): qty 90, 90d supply, fill #0
  Filled 2023-08-20: qty 90, 90d supply, fill #1

## 2023-03-22 MED ORDER — LISINOPRIL 5 MG PO TABS
5.0000 mg | ORAL_TABLET | Freq: Every day | ORAL | 0 refills | Status: DC
  Filled 2023-03-22 – 2023-05-01 (×2): qty 90, 90d supply, fill #0

## 2023-03-22 MED ORDER — TIRZEPATIDE 2.5 MG/0.5ML ~~LOC~~ SOAJ: 2.5000 mg | SUBCUTANEOUS | Status: DC

## 2023-03-22 MED ORDER — OMEGA-3-ACID ETHYL ESTERS 1 G PO CAPS
1.0000 g | ORAL_CAPSULE | Freq: Every day | ORAL | 1 refills | Status: DC
  Filled 2023-03-22 – 2023-05-01 (×2): qty 90, 90d supply, fill #0
  Filled 2023-08-20: qty 90, 90d supply, fill #1

## 2023-03-22 NOTE — Assessment & Plan Note (Signed)
 Due to DM, HTN, HLD. Encouraged diet and exercise with goal of losing 1-2lbs per week. Call with any concerns.

## 2023-03-22 NOTE — Assessment & Plan Note (Signed)
 Not interested in medication at this time. Continue to monitor. Call with any concerns.

## 2023-03-22 NOTE — Assessment & Plan Note (Signed)
 Doing much better with A1c of 8.1 down from 9.4. Previously unable to tolerate rybelsus. Will try him on sample of mounjaro. Follow up with endocrinology in 2 weeks as scheduled. Call with any concerns.

## 2023-03-22 NOTE — Assessment & Plan Note (Signed)
 Unable to tolerate statins. Continue zetia and lovaza. Continue to monitor.

## 2023-03-22 NOTE — Assessment & Plan Note (Signed)
 Under good control on current regimen. Continue current regimen. Continue to monitor. Call with any concerns. Refills given. Labs drawn today.

## 2023-03-22 NOTE — Assessment & Plan Note (Signed)
 Rechecking labs today. Await results. Treat as needed.

## 2023-03-22 NOTE — Assessment & Plan Note (Signed)
 Following with endocrinology. Labs drawn today. Await results.

## 2023-03-22 NOTE — Assessment & Plan Note (Signed)
 Under good control on current regimen. Continue current regimen. Continue to monitor. Call with any concerns. Refills given.

## 2023-03-22 NOTE — Progress Notes (Signed)
 BP 132/79 (BP Location: Left Arm, Patient Position: Sitting, Cuff Size: Large)   Pulse 83   Ht 5\' 11"  (1.803 m)   Wt 254 lb 6.4 oz (115.4 kg)   SpO2 96%   BMI 35.48 kg/m    Subjective:    Patient ID: Tony Cardenas, male    DOB: [birth year], 53 y.o.   MRN: [id number]  HPI: Tony Cardenas is a 53 y.o. male  Chief Complaint  Patient presents with   Hypertension   Hyperlipidemia   Diabetes   DIABETES Hypoglycemic episodes:no Polydipsia/polyuria: no Visual disturbance: no Chest pain: no Paresthesias: no Glucose Monitoring: yes  Accucheck frequency: Daily  Fasting glucose: 140-180 Taking Insulin?: no  Long acting insulin: tresiba while on steroids- stopped now for about a month Blood Pressure Monitoring: not checking Retinal Examination: Up to Date Foot Exam: Up to Date Diabetic Education: Completed Pneumovax: Up to Date Influenza: Up to Date Aspirin: no  HYPERTENSION / HYPERLIPIDEMIA Satisfied with current treatment? yes Duration of hypertension: chronic BP monitoring frequency: not checking BP medication side effects: no Past BP meds: lisinopril Duration of hyperlipidemia: chronic Cholesterol medication side effects: no Cholesterol supplements: fish oil Past cholesterol medications: zetia Medication compliance: excellent compliance Aspirin: no Recent stressors: no Recurrent headaches: no Visual changes: no Palpitations: no Dyspnea: no Chest pain: no Lower extremity edema: no Dizzy/lightheaded: no  Relevant past medical, surgical, family and social history reviewed and updated as indicated. Interim medical history since our last visit reviewed. Allergies and medications reviewed and updated.  Review of Systems  Constitutional: Negative.   Respiratory: Negative.    Cardiovascular: Negative.   Musculoskeletal: Negative.   Neurological: Negative.   Psychiatric/Behavioral: Negative.      Per HPI unless specifically indicated above      Objective:    BP 132/79 (BP Location: Left Arm, Patient Position: Sitting, Cuff Size: Large)   Pulse 83   Ht 5\' 11"  (1.803 m)   Wt 254 lb 6.4 oz (115.4 kg)   SpO2 96%   BMI 35.48 kg/m   Wt Readings from Last 3 Encounters:  03/22/23 254 lb 6.4 oz (115.4 kg)  03/01/23 235 lb (106.6 kg)  02/14/23 249 lb (112.9 kg)    Physical Exam Vitals and nursing note reviewed.  Constitutional:      General: He is not in acute distress.    Appearance: Normal appearance. He is not ill-appearing, toxic-appearing or diaphoretic.  HENT:     Head: Normocephalic and atraumatic.     Right Ear: External ear normal.     Left Ear: External ear normal.     Nose: Nose normal.     Mouth/Throat:     Mouth: Mucous membranes are moist.     Pharynx: Oropharynx is clear.  Eyes:     General: No scleral icterus.       Right eye: No discharge.        Left eye: No discharge.     Extraocular Movements: Extraocular movements intact.     Conjunctiva/sclera: Conjunctivae normal.     Pupils: Pupils are equal, round, and reactive to light.  Cardiovascular:     Rate and Rhythm: Normal rate and regular rhythm.     Pulses: Normal pulses.     Heart sounds: Normal heart sounds. No murmur heard.    No friction rub. No gallop.  Pulmonary:     Effort: Pulmonary effort is normal. No respiratory distress.     Breath sounds: Normal breath sounds. No stridor.  No wheezing, rhonchi or rales.  Chest:     Chest wall: No tenderness.  Musculoskeletal:        General: Normal range of motion.     Cervical back: Normal range of motion and neck supple.  Skin:    General: Skin is warm and dry.     Capillary Refill: Capillary refill takes less than 2 seconds.     Coloration: Skin is not jaundiced or pale.     Findings: No bruising, erythema, lesion or rash.  Neurological:     General: No focal deficit present.     Mental Status: He is alert and oriented to person, place, and time. Mental status is at baseline.  Psychiatric:         Mood and Affect: Mood normal.        Behavior: Behavior normal.        Thought Content: Thought content normal.        Judgment: Judgment normal.     Results for orders placed or performed in visit on 02/14/23  ToxASSURE Select 13 (MW), Urine   Collection Time: 02/14/23  2:34 PM  Result Value Ref Range   Summary FINAL       Assessment & Plan:   Problem List Items Addressed This Visit       Cardiovascular and Mediastinum   HTN (hypertension)   Under good control on current regimen. Continue current regimen. Continue to monitor. Call with any concerns. Refills given. Labs drawn today.        Relevant Medications   ezetimibe (ZETIA) 10 MG tablet   lisinopril (ZESTRIL) 5 MG tablet   omega-3 acid ethyl esters (LOVAZA) 1 g capsule     Respiratory   COPD (chronic obstructive pulmonary disease) (HCC)   Under good control on current regimen. Continue current regimen. Continue to monitor. Call with any concerns. Refills given.          Endocrine   Type 2 diabetes mellitus with hyperglycemia (HCC) - Primary   Doing much better with A1c of 8.1 down from 9.4. Previously unable to tolerate rybelsus. Will try him on sample of mounjaro. Follow up with endocrinology in 2 weeks as scheduled. Call with any concerns.       Relevant Medications   lisinopril (ZESTRIL) 5 MG tablet   tirzepatide (MOUNJARO) 2.5 MG/0.5ML Pen   Other Relevant Orders   Bayer DCA Hb A1c Waived   CBC with Differential/Platelet   Comprehensive metabolic panel   Lipid Panel w/o Chol/HDL Ratio     Hematopoietic and Hemostatic   Essential (hemorrhagic) thrombocythemia (HCC)   Rechecking labs today. Await results. Treat as needed.         Other   Hyperlipidemia   Unable to tolerate statins. Continue zetia and lovaza. Continue to monitor.       Relevant Medications   ezetimibe (ZETIA) 10 MG tablet   lisinopril (ZESTRIL) 5 MG tablet   omega-3 acid ethyl esters (LOVAZA) 1 g capsule   Abnormal TSH    Following with endocrinology. Labs drawn today. Await results.       Relevant Orders   TSH   Vitamin D deficiency   Rechecking labs today. Await results. Treat as needed.       Relevant Orders   VITAMIN D 25 Hydroxy (Vit-D Deficiency, Fractures)   Moderately severe major depression (HCC)   Not interested in medication at this time. Continue to monitor. Call with any concerns.       Morbid  obesity (HCC)   Due to DM, HTN, HLD. Encouraged diet and exercise with goal of losing 1-2lbs per week. Call with any concerns.       Relevant Medications   tirzepatide [facility]) 2.5 MG/0.5ML Pen     Follow up plan: Return in about 6 months (around 09/22/2023) for physical, AWV with Gunnar Fusi ASAP Please.

## 2023-03-23 ENCOUNTER — Encounter: Payer: No Typology Code available for payment source | Admitting: Physical Therapy

## 2023-03-23 LAB — CBC WITH DIFFERENTIAL/PLATELET
Basophils Absolute: 0.1 10*3/uL (ref 0.0–0.2)
Basos: 1 %
EOS (ABSOLUTE): 0.2 10*3/uL (ref 0.0–0.4)
Eos: 2 %
Hematocrit: 43 % (ref 37.5–51.0)
Hemoglobin: 14.2 g/dL (ref 13.0–17.7)
Immature Grans (Abs): 0.1 10*3/uL (ref 0.0–0.1)
Immature Granulocytes: 1 %
Lymphocytes Absolute: 3.8 10*3/uL — ABNORMAL HIGH (ref 0.7–3.1)
Lymphs: 42 %
MCH: 31 pg (ref 26.6–33.0)
MCHC: 33 g/dL (ref 31.5–35.7)
MCV: 94 fL (ref 79–97)
Monocytes Absolute: 0.6 10*3/uL (ref 0.1–0.9)
Monocytes: 6 %
Neutrophils Absolute: 4.4 10*3/uL (ref 1.4–7.0)
Neutrophils: 48 %
Platelets: 364 10*3/uL (ref 150–450)
RBC: 4.58 x10E6/uL (ref 4.14–5.80)
RDW: 13.2 % (ref 11.6–15.4)
WBC: 9.1 10*3/uL (ref 3.4–10.8)

## 2023-03-23 LAB — COMPREHENSIVE METABOLIC PANEL
ALT: 16 IU/L (ref 0–44)
AST: 14 IU/L (ref 0–40)
Albumin: 4.2 g/dL (ref 3.8–4.9)
Alkaline Phosphatase: 63 IU/L (ref 44–121)
BUN/Creatinine Ratio: 15 (ref 9–20)
BUN: 10 mg/dL (ref 6–24)
Bilirubin Total: <0.2 mg/dL (ref 0.0–1.2)
CO2: 19 mmol/L — ABNORMAL LOW (ref 20–29)
Calcium: 9.4 mg/dL (ref 8.7–10.2)
Chloride: 103 mmol/L (ref 96–106)
Creatinine, Ser: 0.67 mg/dL — ABNORMAL LOW (ref 0.76–1.27)
Globulin, Total: 2.5 g/dL (ref 1.5–4.5)
Glucose: 197 mg/dL — ABNORMAL HIGH (ref 70–99)
Potassium: 4.1 mmol/L (ref 3.5–5.2)
Sodium: 140 mmol/L (ref 134–144)
Total Protein: 6.7 g/dL (ref 6.0–8.5)
eGFR: 111 mL/min/{1.73_m2} (ref >59)

## 2023-03-23 LAB — LIPID PANEL W/O CHOL/HDL RATIO
Cholesterol, Total: 203 mg/dL — ABNORMAL HIGH (ref 100–199)
HDL: 21 mg/dL — ABNORMAL LOW (ref >39)
Triglycerides: 1312 mg/dL (ref 0–149)

## 2023-03-23 LAB — VITAMIN D 25 HYDROXY (VIT D DEFICIENCY, FRACTURES): Vit D, 25-Hydroxy: 26.7 ng/mL — ABNORMAL LOW (ref 30.0–100.0)

## 2023-03-23 LAB — TSH: TSH: 0.64 u[IU]/mL (ref 0.450–4.500)

## 2023-03-31 ENCOUNTER — Encounter: Payer: Self-pay | Admitting: Family Medicine

## 2023-04-04 ENCOUNTER — Ambulatory Visit: Payer: No Typology Code available for payment source | Admitting: Nurse Practitioner

## 2023-04-04 ENCOUNTER — Other Ambulatory Visit: Payer: Self-pay

## 2023-04-04 DIAGNOSIS — E559 Vitamin D deficiency, unspecified: Secondary | ICD-10-CM

## 2023-04-04 DIAGNOSIS — I10 Essential (primary) hypertension: Secondary | ICD-10-CM

## 2023-04-04 DIAGNOSIS — R7989 Other specified abnormal findings of blood chemistry: Secondary | ICD-10-CM

## 2023-04-04 DIAGNOSIS — E782 Mixed hyperlipidemia: Secondary | ICD-10-CM

## 2023-04-04 DIAGNOSIS — Z7984 Long term (current) use of oral hypoglycemic drugs: Secondary | ICD-10-CM

## 2023-04-04 DIAGNOSIS — E119 Type 2 diabetes mellitus without complications: Secondary | ICD-10-CM

## 2023-04-12 ENCOUNTER — Telehealth: Payer: Self-pay | Admitting: Student in an Organized Health Care Education/Training Program

## 2023-04-12 ENCOUNTER — Other Ambulatory Visit: Payer: Self-pay

## 2023-04-12 NOTE — Telephone Encounter (Signed)
 PT called stated that his pharmacy will be close on Saturday, he wants to know if he can pick up prescription for hydrocodone. Please give patient a call TY

## 2023-04-12 NOTE — Telephone Encounter (Signed)
 Called [facility] pharmacy and gave permission to fill on Friday since the pharmacy would be closed on Saturday.

## 2023-04-14 ENCOUNTER — Other Ambulatory Visit: Payer: Self-pay

## 2023-04-19 ENCOUNTER — Other Ambulatory Visit: Payer: Self-pay

## 2023-04-19 DIAGNOSIS — Z7960 Long term (current) use of unspecified immunomodulators and immunosuppressants: Secondary | ICD-10-CM | POA: Diagnosis not present

## 2023-04-19 DIAGNOSIS — M0609 Rheumatoid arthritis without rheumatoid factor, multiple sites: Secondary | ICD-10-CM | POA: Diagnosis not present

## 2023-04-19 MED ORDER — METHOTREXATE SODIUM 2.5 MG PO TABS
20.0000 mg | ORAL_TABLET | ORAL | 1 refills | Status: DC
  Filled 2023-04-19: qty 96, 84d supply, fill #0

## 2023-04-19 MED ORDER — HYDROXYCHLOROQUINE SULFATE 200 MG PO TABS
200.0000 mg | ORAL_TABLET | Freq: Two times a day (BID) | ORAL | 1 refills | Status: DC
  Filled 2023-04-19: qty 180, 90d supply, fill #0

## 2023-04-24 ENCOUNTER — Other Ambulatory Visit: Payer: Self-pay | Admitting: Neurosurgery

## 2023-04-24 DIAGNOSIS — M542 Cervicalgia: Secondary | ICD-10-CM

## 2023-04-24 NOTE — Progress Notes (Signed)
 I reviewed [name]'s imaging with Dr. Mont Antis. While he is not fused at C1-2, this has been the case for some time.  He appears to have worsening degenerative changes at C6-7 and C2-3. Given the localization of his pain, Dr. Mont Antis recommended that we start with cervical facet injections at C6-7 and reassess his response. They patient would like to see Dr. Rhesa Celeste for this. He will let me know when he is scheduled and we will schedule a follow up telephone visit to discuss his response and next steps. Aitana Burry

## 2023-04-25 ENCOUNTER — Ambulatory Visit
Admission: EM | Admit: 2023-04-25 | Discharge: 2023-04-25 | Disposition: A | Discharge status: Home / Self Care | Attending: Family Medicine | Admitting: Family Medicine

## 2023-04-25 ENCOUNTER — Ambulatory Visit (INDEPENDENT_AMBULATORY_CARE_PROVIDER_SITE_OTHER)

## 2023-04-25 DIAGNOSIS — M25532 Pain in left wrist: Secondary | ICD-10-CM

## 2023-04-25 DIAGNOSIS — S6392XA Sprain of unspecified part of left wrist and hand, initial encounter: Secondary | ICD-10-CM | POA: Diagnosis not present

## 2023-04-25 DIAGNOSIS — M79642 Pain in left hand: Secondary | ICD-10-CM | POA: Diagnosis not present

## 2023-04-25 DIAGNOSIS — M1812 Unilateral primary osteoarthritis of first carpometacarpal joint, left hand: Secondary | ICD-10-CM | POA: Diagnosis not present

## 2023-04-25 DIAGNOSIS — S63502A Unspecified sprain of left wrist, initial encounter: Secondary | ICD-10-CM | POA: Diagnosis not present

## 2023-04-25 NOTE — Discharge Instructions (Addendum)
 Please use the wrist brace to the left hand to help support the hand and wrist.  You may continue elevation and ice.  Continue your Norco as previously prescribed.  Please follow-up with your PCP in 1 week for recheck or sooner if symptoms do not improve.  Please go to the ER for any worsening symptoms.  Hope you feel better soon!

## 2023-04-25 NOTE — ED Triage Notes (Signed)
 Patient states that he was working on a car sat and twisted his left hand the wrong way. Patient states that he can hardly move his hand and fingers now.
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
## 2023-05-01 ENCOUNTER — Other Ambulatory Visit: Payer: Self-pay

## 2023-05-01 DIAGNOSIS — Z008 Encounter for other general examination: Secondary | ICD-10-CM | POA: Diagnosis not present

## 2023-05-01 DIAGNOSIS — E669 Obesity, unspecified: Secondary | ICD-10-CM | POA: Diagnosis not present

## 2023-05-01 DIAGNOSIS — E1169 Type 2 diabetes mellitus with other specified complication: Secondary | ICD-10-CM | POA: Diagnosis not present

## 2023-05-01 DIAGNOSIS — Z6834 Body mass index (BMI) 34.0-34.9, adult: Secondary | ICD-10-CM | POA: Diagnosis not present

## 2023-05-01 DIAGNOSIS — D84821 Immunodeficiency due to drugs: Secondary | ICD-10-CM | POA: Diagnosis not present

## 2023-05-01 DIAGNOSIS — E785 Hyperlipidemia, unspecified: Secondary | ICD-10-CM | POA: Diagnosis not present

## 2023-05-01 MED ORDER — LISINOPRIL 5 MG PO TABS
5.0000 mg | ORAL_TABLET | Freq: Every day | ORAL | 3 refills | Status: AC
  Filled 2023-05-01: qty 100, 100d supply, fill #0
  Filled 2023-08-20: qty 100, 100d supply, fill #1
  Filled 2023-11-18: qty 100, 100d supply, fill #2

## 2023-05-02 ENCOUNTER — Other Ambulatory Visit: Payer: Self-pay

## 2023-05-03 ENCOUNTER — Other Ambulatory Visit: Payer: Self-pay

## 2023-05-03 MED ORDER — FOLIC ACID 1 MG PO TABS
1.0000 mg | ORAL_TABLET | Freq: Every day | ORAL | 3 refills | Status: AC
Start: 2023-05-03 — End: ?
  Filled 2023-05-03: qty 90, 90d supply, fill #0

## 2023-05-04 ENCOUNTER — Encounter: Payer: Self-pay | Admitting: Neurosurgery

## 2023-05-04 ENCOUNTER — Other Ambulatory Visit: Payer: Self-pay

## 2023-05-04 ENCOUNTER — Ambulatory Visit (INDEPENDENT_AMBULATORY_CARE_PROVIDER_SITE_OTHER): Admitting: Neurosurgery

## 2023-05-04 VITALS — BP 130/78 | Ht 71.0 in | Wt 246.0 lb

## 2023-05-04 DIAGNOSIS — M25532 Pain in left wrist: Secondary | ICD-10-CM | POA: Diagnosis not present

## 2023-05-04 DIAGNOSIS — M79642 Pain in left hand: Secondary | ICD-10-CM | POA: Diagnosis not present

## 2023-05-04 DIAGNOSIS — M7989 Other specified soft tissue disorders: Secondary | ICD-10-CM

## 2023-05-04 DIAGNOSIS — M542 Cervicalgia: Secondary | ICD-10-CM | POA: Diagnosis not present

## 2023-05-04 MED ORDER — LISINOPRIL 5 MG PO TABS
5.0000 mg | ORAL_TABLET | Freq: Every day | ORAL | 0 refills | Status: DC
  Filled 2023-05-04: qty 90, 90d supply, fill #0

## 2023-05-04 NOTE — Progress Notes (Signed)
 Progress Note: Referring Physician:  Solomon Dupre, DO [address] [place],  [place] [address]  Primary Physician:  Solomon Dupre, DO  Chief Complaint:  ongoing neck pain  History of Present Illness: Tony Cardenas is a 54 y.o. male who presents today for follow up regarding his cervical spine. Since his last visit he has had intermittent left radiating shoulder and arm pain in addition to his previously described neck pain.  He is also having significant pain in his left palm and wrist with associated swelling.  He was seen both at urgent care and in the ER and given a brace for this.  The brace has helped some but only while he is using it.  He has not formally seen orthopedics.  02/07/23 Tony Cardenas is a 53 y.o presenting today via telephone visit to review his MRI results.    01/17/23 Tony Cardenas a 53 y.o presenting today via telephone visit to review his x-rays and response to trigger point injections. He states he underwent these injections with his pain doctor which did provide him with some relief for a couple of weeks before the pain returned to the original severity.  He states it is worse with the weather being colder.  He continues to deny pain that radiates beyond his shoulders.   12/20/2022 Tony Cardenas is a 53 y.o with a history of RA, previous smoker, DM, and multiple cervical surgeries who is here today with a chief complaint of acute on chronic neck pain.  He states that this has been going on for about a year to year and a half.  At baseline he has some discomfort in his neck and radiating pain in to his right arm that has been present since his surgery however over the last year or so he has had an increase of neck pain that radiates into his bilateral shoulders worse on the left than the right without any inciting event.  He was seen by Dr. Rhesa Celeste and underwent trigger point injections about a week ago which provided him with significant relief and he now feels  he is back to his baseline however he is concerned given his history for underlying hardware malfunction as he feels the exacerbation felt like his symptoms prior to surgery.  He denies any new or worsening gait disturbance or weakness.   Conservative measures:  Physical therapy: has not participated in recently Multimodal medical therapy including regular antiinflammatories: hydrocodone , robaxin , prednisone   Injections: 12/14/22: Cervical trigger point injection (Dr. Rhesa Celeste) 02/16/22: Cervical trigger point injection (Dr. Rhesa Celeste)   Past Surgery:  05/01/19: C3-6 PSF by Dr. Mont Antis  10/10/18: C3-5 disectomy C4 corpectomy by Dr. Mont Antis Neck Surgery in 1989 Back Surgery??   Tony Cardenas has no symptoms of cervical myelopathy.   The symptoms are causing a significant impact on the patient's life.   Exam: Today's Vitals   05/04/23 1031  BP: 130/78  Weight: 111.6 kg  Height: 5\' 11"  (1.803 m)  PainSc: 8   PainLoc: Neck   Body mass index is 34.31 kg/m.    NEUROLOGICAL:  General: In no acute distress.   Awake, alert, oriented to person, place, and time.  Pupils equal round and reactive to light.  Facial tone is symmetric.    Strength: Side Biceps Triceps Deltoid Interossei Grip Wrist Ext. Wrist Flex.  R 5 5 5 5 5 5 5   L 5 5 5 5 5 5 5      TTP over left  palmar surface near the at the base of the 3rd metacarpal and radial head. Pain with both active and passive range of motion of the fingers and wrist on the left.  Imaging: 03/17/23 CT C spine FINDINGS: Alignment: The alignment appears unchanged. Chronic minimal anterolisthesis at C2-3 and C6-7.   Skull base and vertebrae: Compared with the previous cervical spine CT, the cervical fusion has been revised. The anterior plate and screws at C3-5 have been removed and posterior screw rod fixation has been performed bilaterally from C3 through C6. The new hardware is intact. The interbody and interfacetal fusion appears  solid. Pre-existing postsurgical changes from C4 corpectomy and cerclage for fixation posteriorly at C1-2 are unchanged. There is no solid fusion at C1-2. There is no evidence of acute fracture or traumatic subluxation.   Soft tissues and spinal canal: No prevertebral fluid or swelling. No visible canal hematoma.   Disc levels: No acute disc space findings or high-grade spinal stenosis demonstrated. Stable mild right foraminal narrowing at C2-3. Stable mild chronic foraminal narrowing bilaterally at C3-4. No significant osseous foraminal narrowing at C4-5 or C5-6. There is asymmetric facet hypertrophy on the right at C6-7 with mild right foraminal narrowing.   Upper chest: Unremarkable.   Other: None.   IMPRESSION: 1. No evidence of acute cervical spine fracture, traumatic subluxation or static signs of instability. 2. Interval revision of cervical fusion as described with solid interbody and interfacetal fusion from C3 through C6. The new hardware is intact. 3. Stable pre-existing postsurgical changes from C4 corpectomy and cerclage for fixation posteriorly at C1-2. No solid fusion at C1-2. 4. No acute disc space findings or high-grade spinal stenosis. Mild chronic foraminal narrowing as described.     Electronically Signed   By: Elmon Hagedorn M.D.   On: 04/10/2023 12:44  I have personally reviewed the images and agree with the above interpretation.  Assessment and Plan: Mr. Fazzino is a pleasant 54 y.o. male with significant swelling and pain in both his left hand and wrist.  I recommended referral to a hand specialist for further evaluation. I have sent a referral to Tony Cardenas office for this. I also believe that he has an underlying cervical radiculopathy on the left that may explain some of his neck and shoulder pain in addition to his facet arthropathy.  In the message to the pain clinic regarding this.  He does have a scheduled follow-up on 5/1.  I spent a  total of 46 minutes in both face-to-face and non-face-to-face activities for this visit on the date of this encounter including outside records discussion of symptoms, discussion of imaging, discussion of differential diagnosis, physical exam, documentation, and order placement.  Anastacio Karvonen PA-C Neurosurgery
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
## 2023-05-05 ENCOUNTER — Ambulatory Visit: Admitting: Nurse Practitioner

## 2023-05-10 ENCOUNTER — Other Ambulatory Visit: Payer: Self-pay

## 2023-05-10 DIAGNOSIS — M79642 Pain in left hand: Secondary | ICD-10-CM | POA: Diagnosis not present

## 2023-05-10 DIAGNOSIS — M542 Cervicalgia: Secondary | ICD-10-CM | POA: Insufficient documentation

## 2023-05-10 MED ORDER — PREDNISONE 5 MG PO TABS
ORAL_TABLET | ORAL | 0 refills | Status: AC
  Filled 2023-05-10: qty 21, 6d supply, fill #0

## 2023-05-10 NOTE — Progress Notes (Signed)
 PROVIDER NOTE: Interpretation of information contained herein should be left to medically-trained personnel. Specific patient instructions are provided elsewhere under "Patient Instructions" section of medical record. This document was created in part using AI and STT-dictation technology, any transcriptional errors that may result from this process are unintentional.  Patient: Tony Cardenas  Service: E/M   PCP: Solomon Dupre, DO  DOB: [birth year]  DOS: 05/11/2023  Provider: Cherylin Corrigan, NP  MRN: [id number]  Delivery: Face-to-face  Specialty: Interventional Pain Management  Type: Established Patient  Setting: Ambulatory outpatient facility  Specialty designation: 09  Referring Prov.: Solomon Dupre, DO  Location: Outpatient office facility       HPI  [name], a 53 y.o. year old male, is here today because of his Cervical radicular pain [M54.12]. [name] primary complain today is Neck Pain (Neck and left arm pain)   Pain Assessment: Severity of Chronic pain is reported as a 9 /10. Location: Neck Left (arm and wrist)/radiates down left arm to tips of fingers. Onset: More than a month ago. Quality: Cookie Dense. Timing: Constant. Modifying factor(s): laying down. Vitals:  height is 5\' 11"  (1.803 m) and weight is 245 lb (111.1 kg). His temperature is 97.5 F (36.4 C) (abnormal). His blood pressure is 144/72 (abnormal) and his pulse is 92. His oxygen saturation is 98%.  BMI: Estimated body mass index is 34.17 kg/m as calculated from the following:   Height as of this encounter: 5\' 11"  (1.803 m).   Weight as of this encounter: 245 lb (111.1 kg). Last encounter: 02/14/2023 Last procedure: 03/01/2023  Reason for encounter: medication management. No change in medical history since last visit.  Patient's pain is at baseline.  Patient continues multimodal pain regimen as prescribed.  States that it provides pain relief and improvement in functional status.  The  patient has shown improvement in functional activities and a decrease in stiffness following the Cervical Epidural Steroid Injection (CESI).  The patient complains of neck pain and left arm pain, with increasing swelling in the left hand; however the patient is currently taking a prednisone  Dosepak, prescribed by his primary care provider, to manage swelling in his left hand.   Post-procedure evaluation   Cervical Epidural Steroid injection (CESI) (Interlaminar) #1   Effectiveness: Initial hour after procedure: 100%  Subsequent 4-6 hours post-procedure: 100%  Analgesia past initial 6 hours: 60%  Ongoing improvement:  Analgesic: The patient reports 100% pain relief immediately following the procedure and maintains approximately 60% ongoing pain relief, along with improvement in functional activities and reduction in stiffness Function: [name] reports improvement in function ROM: [name] reports improvement in ROM  Pharmacotherapy Assessment  Analgesic: Hydrocodone -acetaminophen  (Norco) 7.5-325 mg tablet every 6 hours as needed for moderate pain. MME=30 Monitoring: [place] PMP: PDMP reviewed during this encounter.       Pharmacotherapy: No side-effects or adverse reactions reported. Compliance: No problems identified. Effectiveness: Clinically acceptable.  Merilyn Staple, RN  05/11/2023  8:05 AM  Sign when Signing Visit Safety precautions to be maintained throughout the outpatient stay will include: orient to surroundings, keep bed in low position, maintain call bell within reach at all times, provide assistance with transfer out of bed and ambulation.   Nursing Pain Medication Assessment:  Safety precautions to be maintained throughout the outpatient stay will include: orient to surroundings, keep bed in low position, maintain call bell within reach at all times, provide assistance with transfer out of bed and ambulation.  Medication Inspection Compliance: Pill count conducted under  aseptic conditions, in front of the patient. Neither the pills nor the bottle was removed from the patient's sight at any time. Once count was completed pills were immediately returned to the patient in their original bottle.  Medication: Hydrocodone /APAP Pill/Patch Count:  11 of 120 pills remain Pill/Patch Appearance: Markings consistent with prescribed medication Bottle Appearance: Standard pharmacy container. Clearly labeled. Filled Date: 4 / 4 / 2025 Last Medication intake:  Today    No results found for: "CBDTHCR" No results found for: "D8THCCBX" No results found for: "D9THCCBX"  UDS:  Summary  Date Value Ref Range Status  02/14/2023 FINAL  Final    Comment:    ==================================================================== [employer] [facility] 13 (MW) ==================================================================== Test                             Result       Flag       Units  Drug Present and Declared for Prescription Verification   Hydrocodone                     786          EXPECTED   ng/mg creat   Hydromorphone                   264          EXPECTED   ng/mg creat   Dihydrocodeine                 82           EXPECTED   ng/mg creat   Norhydrocodone                 542          EXPECTED   ng/mg creat    Sources of hydrocodone  include scheduled prescription medications.    Hydromorphone , dihydrocodeine and norhydrocodone are expected    metabolites of hydrocodone . Hydromorphone  and dihydrocodeine are    also available as scheduled prescription medications.  Drug Absent but Declared for Prescription Verification   Diazepam                        Not Detected UNEXPECTED ng/mg creat ==================================================================== Test                      Result    Flag   Units      Ref Range   Creatinine              187              mg/dL      >=16 ==================================================================== Declared Medications:  The  flagging and interpretation on this report are based on the  following declared medications.  Unexpected results may arise from  inaccuracies in the declared medications.   **Note: The testing scope of this panel includes these medications:   Diazepam  (Valium )  Hydrocodone  (Norco)   **Note: The testing scope of this panel does not include the  following reported medications:   Acetaminophen  (Norco)  Albuterol  (Ventolin  HFA)  Ezetimibe  (Zetia )  Fluticasone  (Flonase )  Folic Acid  (Folvite )  Glipizide  (Glucotrol )  Hydroxychloroquine  (Plaquenil )  Lisinopril  (Zestril )  Metformin  (Glucophage )  Methocarbamol  (Robaxin )  Methotrexate   Omega-3-Acid  Ethyl Esters (Lovaza )  Prednisone  (Deltasone )  Vitamin D2 (Drisdol ) ==================================================================== For clinical consultation, please call [phone]. ====================================================================  ROS  Constitutional: Denies any fever or chills Gastrointestinal: No reported hemesis, hematochezia, vomiting, or acute GI distress Musculoskeletal: neck pain, left hand pain, swelling on left hand  Neurological: No reported episodes of acute onset apraxia, aphasia, dysarthria, agnosia, amnesia, paralysis, loss of coordination, or loss of consciousness  Medication Review  FreeStyle Freedom Lite, FreeStyle Libre 3 Sensor, HYDROcodone -acetaminophen , albuterol , etanercept , ezetimibe , fluticasone , folic acid , freestyle, glipiZIDE , glucose blood, hydroxychloroquine , lisinopril , metFORMIN , methocarbamol , methotrexate , omega-3 acid ethyl esters, predniSONE , and tirzepatide  History Review  Allergy: [name] is allergic to atorvastatin , hydromorphone  hcl, and semaglutide . Drug: [name]  reports no history of drug use. Alcohol:  reports no history of alcohol use. Tobacco:  reports that he quit smoking about 4 years ago. His smoking use included cigarettes. He started smoking  about 24 years ago. He has a 20 pack-year smoking history. He quit smokeless tobacco use about 7 years ago.  His smokeless tobacco use included chew. Social: [name]  reports that he quit smoking about 4 years ago. His smoking use included cigarettes. He started smoking about 24 years ago. He has a 20 pack-year smoking history. He quit smokeless tobacco use about 7 years ago.  His smokeless tobacco use included chew. He reports that he does not drink alcohol and does not use drugs. Medical:  has a past medical history of Arthritis, COPD (chronic obstructive pulmonary disease) (HCC), Diabetes mellitus without complication (HCC), GERD (gastroesophageal reflux disease), Hyperlipidemia, Hypertension, and Neck pain (1989). Surgical: [name]  has a past surgical history that includes Neck surgery (1989); Knee surgery (Right); Colonoscopy (N/A, 06/02/2014); Polypectomy (06/02/2014); Appendectomy; Esophagogastroduodenoscopy (egd) with propofol  (N/A, 07/29/2016); Back surgery; Anterior cervical corpectomy (N/A, 10/10/2018); Hernia repair (over 10 years ago); Anterior cervical corpectomy (N/A, 05/01/2019); Posterior cervical fusion/foraminotomy (N/A, 05/01/2019); Colonoscopy with propofol  (N/A, 11/15/2021); Spine surgery; and Cholecystectomy. Family: family history includes Alcohol abuse in his father; COPD in his father; Cancer in his mother; Diabetes in his brother, father, and paternal grandmother.  Laboratory Chemistry Profile   Renal Lab Results  Component Value Date   BUN 10 03/22/2023   CREATININE 0.67 (L) 03/22/2023   BCR 15 03/22/2023   GFRAA 114 12/16/2019   GFRNONAA >60 11/11/2021    Hepatic Lab Results  Component Value Date   AST 14 03/22/2023   ALT 16 03/22/2023   ALBUMIN 4.2 03/22/2023   ALKPHOS 63 03/22/2023   HCVAB NON REACTIVE 03/24/2020    Electrolytes Lab Results  Component Value Date   NA 140 03/22/2023   K 4.1 03/22/2023   CL 103 03/22/2023   CALCIUM  9.4 03/22/2023     Bone Lab Results  Component Value Date   VD25OH 26.7 (L) 03/22/2023    Inflammation (CRP: Acute Phase) (ESR: Chronic Phase) Lab Results  Component Value Date   CRP 0.7 05/26/2020   ESRSEDRATE 31 (H) 11/11/2021         Note: Above Lab results reviewed.  Recent Imaging Review  DG Hand Complete Left CLINICAL DATA:  Twisted left hand the wrong way when working on a car. Most of pain in the palm of hand and within the wrist.  EXAM: LEFT WRIST - COMPLETE 3+ VIEW; LEFT HAND - COMPLETE 3+ VIEW  COMPARISON:  Left hand radiographs 05/05/2017  FINDINGS: Left wrist:  Neutral ulnar variance. Moderate thumb carpometacarpal joint space narrowing and peripheral osteophytosis. Mild triscaphe joint space narrowing and peripheral spurring. Mild thumb metacarpophalangeal joint space narrowing and peripheral osteophytosis. No acute fracture is seen. No dislocation.  Left hand:  Moderate thumb interphalangeal joint space narrowing and dorsolateral osteophytosis, similar to prior. No acute fracture is seen. No dislocation. The cortices are intact.  IMPRESSION: 1. No acute fracture. 2. 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  Electronically Signed   By: Bertina Broccoli M.D.   On: 04/25/2023 13:13 DG Wrist Complete Left CLINICAL DATA:  Twisted left hand the wrong way when working on a car. Most of pain in the palm of hand and within the wrist.  EXAM: LEFT WRIST - COMPLETE 3+ VIEW; LEFT HAND - COMPLETE 3+ VIEW  COMPARISON:  Left hand radiographs 05/05/2017  FINDINGS: Left wrist:  Neutral ulnar variance. Moderate thumb carpometacarpal joint space narrowing and peripheral osteophytosis. Mild triscaphe joint space narrowing and peripheral spurring. Mild thumb metacarpophalangeal joint space narrowing and peripheral osteophytosis. No acute fracture is seen. No dislocation.  Left hand:  Moderate thumb interphalangeal joint space narrowing  and dorsolateral osteophytosis, similar to prior. No acute fracture is seen. No dislocation. The cortices are intact.  IMPRESSION: 1. No acute fracture. 2. 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  Electronically Signed   By: Bertina Broccoli M.D.   On: 04/25/2023 13:13 Note: Reviewed        Physical Exam  General appearance: Well nourished, well developed, and well hydrated. In no apparent acute distress Mental status: Alert, oriented x 3 (person, place, & time)       Respiratory: No evidence of acute respiratory distress Eyes: PERLA Vitals: BP (!) 144/72   Pulse 92   Temp (!) 97.5 F (36.4 C)   Ht 5\' 11"  (1.803 m)   Wt 245 lb (111.1 kg)   SpO2 98%   BMI 34.17 kg/m  BMI: Estimated body mass index is 34.17 kg/m as calculated from the following:   Height as of this encounter: 5\' 11"  (1.803 m).   Weight as of this encounter: 245 lb (111.1 kg). Ideal: Ideal body weight: 75.3 kg (166 lb 0.1 oz) Adjusted ideal body weight: 89.6 kg (197 lb 9.7 oz)  Musculoskeletal: left hand swelling, trouble forming a fist with left hand however improve after steroid medication  Assessment   Diagnosis Status  1. Cervical radicular pain   2. Chronic pain syndrome   3. Cervicalgia   4. Cervical fusion syndrome   5. Chronic inflammatory arthritis   6. Medication management    Improved Controlled Controlled   Updated Problems: No problems updated.   Plan of Care  Problem-specific:  Assessment and Plan  We will continue on current medication regimen.  Prescribing drug monitoring (PDMP) consistent with the prescribed medication.  Given the patient's history of rheumatoid arthritis, he was advised to follow-up with his rheumatologist at [facility] clinic for ongoing management.   [name] has a current medication list which includes the following long-term medication(s): albuterol , ezetimibe , fluticasone , glipizide , lisinopril , lisinopril , metformin ,  omega-3 acid ethyl esters, and enbrel  sureclick.  Pharmacotherapy (Medications Ordered): Meds ordered this encounter  Medications   HYDROcodone -acetaminophen  (NORCO) 7.5-325 MG tablet    Sig: Take 1 tablet by mouth every 6 (six) hours as needed for moderate pain (pain score 4-6).    Dispense:  120 tablet    Refill:  0    Per Caralee Chancy- Dr Rhesa Celeste is okay with [facility] fill med on Friday 04/14/23 due to pharmacy being closed on Saturday.   HYDROcodone -acetaminophen  (NORCO) 7.5-325 MG tablet    Sig: Take 1 tablet by mouth every 6 (six) hours as needed for moderate pain (pain  score 4-6).    Dispense:  120 tablet    Refill:  0    Per Caralee Chancy- Dr Rhesa Celeste is okay with [facility] fill med on Friday 04/14/23 due to pharmacy being closed on Saturday.   HYDROcodone -acetaminophen  (NORCO) 7.5-325 MG tablet    Sig: Take 1 tablet by mouth every 6 (six) hours as needed for moderate pain (pain score 4-6).    Dispense:  120 tablet    Refill:  0    Per Caralee Chancy- Dr Rhesa Celeste is okay with [facility] fill med on Friday 04/14/23 due to pharmacy being closed on Saturday.   Orders:  No orders of the defined types were placed in this encounter.  Follow-up plan:   Return in about 3 months (around 08/11/2023) for (F2F), (MM), Marthe Slain NP.        Recent Visits Date Type Provider Dept  03/01/23 Procedure visit Cephus Collin, MD [facility]  02/14/23 Office Visit Cephus Collin, MD [facility]  Showing recent visits within past 90 days and meeting all other requirements Today's Visits Date Type Provider Dept  05/11/23 Office Visit Jaylie Neaves K, NP [facility]  Showing today's visits and meeting all other requirements Future Appointments No visits were found meeting these conditions. Showing future appointments within next 90 days and meeting all other requirements  I discussed the assessment and treatment plan with the patient. The patient was provided an opportunity to ask questions and all  were answered. The patient agreed with the plan and demonstrated an understanding of the instructions.  Patient advised to call back or seek an in-person evaluation if the symptoms or condition worsens.  Duration of encounter: 30 minutes.  Total time on encounter, as per AMA guidelines included both the face-to-face and non-face-to-face time personally spent by the physician and/or other qualified health care professional(s) on the day of the encounter (includes time in activities that require the physician or other qualified health care professional and does not include time in activities normally performed by clinical staff). Physician's time may include the following activities when performed: Preparing to see the patient (e.g., pre-charting review of records, searching for previously ordered imaging, lab work, and nerve conduction tests) Review of prior analgesic pharmacotherapies. Reviewing PMP Interpreting ordered tests (e.g., lab work, imaging, nerve conduction tests) Performing post-procedure evaluations, including interpretation of diagnostic procedures Obtaining and/or reviewing separately obtained history Performing a medically appropriate examination and/or evaluation Counseling and educating the patient/family/caregiver Ordering medications, tests, or procedures Referring and communicating with other health care professionals (when not separately reported) Documenting clinical information in the electronic or other health record Independently interpreting results (not separately reported) and communicating results to the patient/ family/caregiver Care coordination (not separately reported)  Note by: Sharine Cadle K Chelcea Zahn, NP (TTS and AI technology used. I apologize for any typographical errors that were not detected and corrected.) Date: 05/11/2023; Time: 8:51 AM

## 2023-05-11 ENCOUNTER — Other Ambulatory Visit: Payer: Self-pay

## 2023-05-11 ENCOUNTER — Ambulatory Visit
Payer: No Typology Code available for payment source | Attending: Student in an Organized Health Care Education/Training Program | Admitting: Nurse Practitioner

## 2023-05-11 ENCOUNTER — Encounter: Payer: Self-pay | Admitting: Nurse Practitioner

## 2023-05-11 ENCOUNTER — Ambulatory Visit: Admitting: Neurosurgery

## 2023-05-11 VITALS — BP 144/72 | HR 92 | Temp 97.5°F | Ht 71.0 in | Wt 245.0 lb

## 2023-05-11 DIAGNOSIS — Q761 Klippel-Feil syndrome: Secondary | ICD-10-CM | POA: Diagnosis not present

## 2023-05-11 DIAGNOSIS — M1990 Unspecified osteoarthritis, unspecified site: Secondary | ICD-10-CM

## 2023-05-11 DIAGNOSIS — M5412 Radiculopathy, cervical region: Secondary | ICD-10-CM

## 2023-05-11 DIAGNOSIS — G894 Chronic pain syndrome: Secondary | ICD-10-CM

## 2023-05-11 DIAGNOSIS — Z79899 Other long term (current) drug therapy: Secondary | ICD-10-CM | POA: Diagnosis not present

## 2023-05-11 DIAGNOSIS — M542 Cervicalgia: Secondary | ICD-10-CM

## 2023-05-11 MED ORDER — HYDROCODONE-ACETAMINOPHEN 7.5-325 MG PO TABS
1.0000 | ORAL_TABLET | Freq: Four times a day (QID) | ORAL | 0 refills | Status: DC | PRN
Start: 2023-07-13 — End: 2023-08-09
  Filled 2023-07-13: qty 18, 4d supply, fill #0
  Filled 2023-07-13: qty 120, 30d supply, fill #0
  Filled 2023-07-13: qty 102, 26d supply, fill #0

## 2023-05-11 MED ORDER — HYDROCODONE-ACETAMINOPHEN 7.5-325 MG PO TABS
1.0000 | ORAL_TABLET | Freq: Four times a day (QID) | ORAL | 0 refills | Status: AC | PRN
  Filled 2023-06-13 (×2): qty 120, 30d supply, fill #0

## 2023-05-11 MED ORDER — HYDROCODONE-ACETAMINOPHEN 7.5-325 MG PO TABS
1.0000 | ORAL_TABLET | Freq: Four times a day (QID) | ORAL | 0 refills | Status: AC | PRN
  Filled 2023-05-12: qty 120, 30d supply, fill #0

## 2023-05-11 NOTE — Progress Notes (Signed)
 Safety precautions to be maintained throughout the outpatient stay will include: orient to surroundings, keep bed in low position, maintain call bell within reach at all times, provide assistance with transfer out of bed and ambulation.   Nursing Pain Medication Assessment:  Safety precautions to be maintained throughout the outpatient stay will include: orient to surroundings, keep bed in low position, maintain call bell within reach at all times, provide assistance with transfer out of bed and ambulation.  Medication Inspection Compliance: Pill count conducted under aseptic conditions, in front of the patient. Neither the pills nor the bottle was removed from the patient's sight at any time. Once count was completed pills were immediately returned to the patient in their original bottle.  Medication: Hydrocodone /APAP Pill/Patch Count:  11 of 120 pills remain Pill/Patch Appearance: Markings consistent with prescribed medication Bottle Appearance: Standard pharmacy container. Clearly labeled. Filled Date: 4 / 4 / 2025 Last Medication intake:  Today

## 2023-05-12 ENCOUNTER — Telehealth: Payer: Self-pay | Admitting: Nurse Practitioner

## 2023-05-12 ENCOUNTER — Other Ambulatory Visit: Payer: Self-pay

## 2023-05-12 NOTE — Telephone Encounter (Signed)
 Patient needs to pick meds up today, was told at his visit with [facility] he could pick up Friday as [facility] is closed the weekend. He will out before Monday. Please call [facility] and let them know he can pick up meds today.

## 2023-05-12 NOTE — Telephone Encounter (Signed)
 Pharmacy and patient instructed that he may fill Hydrocodone  today because the pharmacy is closed on Saturday and Sunday.

## 2023-05-16 DIAGNOSIS — G5602 Carpal tunnel syndrome, left upper limb: Secondary | ICD-10-CM | POA: Diagnosis not present

## 2023-05-18 DIAGNOSIS — G5602 Carpal tunnel syndrome, left upper limb: Secondary | ICD-10-CM | POA: Diagnosis not present

## 2023-05-19 ENCOUNTER — Other Ambulatory Visit: Payer: Self-pay

## 2023-05-19 MED ORDER — ROSUVASTATIN CALCIUM 5 MG PO TABS
5.0000 mg | ORAL_TABLET | ORAL | 3 refills | Status: AC
  Filled 2023-05-19: qty 45, 90d supply, fill #0
  Filled 2023-07-30: qty 45, 90d supply, fill #1
  Filled 2023-11-18: qty 45, 90d supply, fill #2

## 2023-05-22 ENCOUNTER — Other Ambulatory Visit: Payer: Self-pay

## 2023-05-22 DIAGNOSIS — M79642 Pain in left hand: Secondary | ICD-10-CM | POA: Diagnosis not present

## 2023-05-22 MED ORDER — PREDNISONE 5 MG PO TABS
ORAL_TABLET | ORAL | 0 refills | Status: AC
  Filled 2023-05-22: qty 42, 12d supply, fill #0

## 2023-05-28 ENCOUNTER — Other Ambulatory Visit: Payer: Self-pay

## 2023-05-29 ENCOUNTER — Other Ambulatory Visit: Payer: Self-pay

## 2023-06-06 ENCOUNTER — Ambulatory Visit: Admitting: Orthopedic Surgery

## 2023-06-08 DIAGNOSIS — G5602 Carpal tunnel syndrome, left upper limb: Secondary | ICD-10-CM | POA: Diagnosis not present

## 2023-06-09 ENCOUNTER — Other Ambulatory Visit: Payer: Self-pay

## 2023-06-09 MED ORDER — FENOFIBRATE 54 MG PO TABS
54.0000 mg | ORAL_TABLET | Freq: Every day | ORAL | 3 refills | Status: AC
  Filled 2023-06-09: qty 100, 100d supply, fill #0
  Filled 2023-09-11 (×2): qty 100, 100d supply, fill #1
  Filled 2024-01-06: qty 100, 100d supply, fill #2

## 2023-06-12 ENCOUNTER — Other Ambulatory Visit: Payer: Self-pay

## 2023-06-13 ENCOUNTER — Encounter: Payer: Self-pay | Admitting: Emergency Medicine

## 2023-06-13 ENCOUNTER — Other Ambulatory Visit: Payer: Self-pay

## 2023-06-13 DIAGNOSIS — G5602 Carpal tunnel syndrome, left upper limb: Secondary | ICD-10-CM | POA: Diagnosis not present

## 2023-06-13 DIAGNOSIS — M65332 Trigger finger, left middle finger: Secondary | ICD-10-CM | POA: Diagnosis not present

## 2023-06-20 ENCOUNTER — Encounter: Payer: No Typology Code available for payment source | Admitting: Internal Medicine

## 2023-06-20 ENCOUNTER — Ambulatory Visit: Admitting: Emergency Medicine

## 2023-06-20 VITALS — Ht 71.0 in | Wt 245.0 lb

## 2023-06-20 DIAGNOSIS — Z0000 Encounter for general adult medical examination without abnormal findings: Secondary | ICD-10-CM

## 2023-06-20 DIAGNOSIS — F17210 Nicotine dependence, cigarettes, uncomplicated: Secondary | ICD-10-CM

## 2023-06-20 NOTE — Patient Instructions (Signed)
 [name] , Thank you for taking time out of your busy schedule to complete your Annual Wellness Visit with me. I enjoyed our conversation and look forward to speaking with you again next year. I, as well as your care team,  appreciate your ongoing commitment to your health goals. Please review the following plan we discussed and let me know if I can assist you in the future. Your Game plan/ To Do List    Referrals: I have placed a referral to [facility] to evaluate the need for lung cancer screening due to your smoking history. Their number is [phone]. If you haven't heard from the office you've been referred to, please reach out to them at the phone provided.   Follow up Visits: Next Medicare AWV with our clinical staff: 06/25/24 @ 8:40am (PHONE VISIT)   Have you seen your provider in the last 6 months (3 months if uncontrolled diabetes)? Yes Next Office Visit with your provider: 09/27/23 @ 8:40 with Dr. Lincoln Renshaw  Clinician Recommendations:  Aim for 30 minutes of exercise or brisk walking, 6-8 glasses of water , and 5 servings of fruits and vegetables each day.       This is a list of the screening recommended for you and due dates:  Health Maintenance  Topic Date Due   Screening for Lung Cancer  12/08/2018   Flu Shot  08/11/2023   Eye exam for diabetics  08/18/2023   Yearly kidney health urinalysis for diabetes  09/22/2023   Complete foot exam   09/22/2023   Hemoglobin A1C  09/22/2023   DTaP/Tdap/Td vaccine (2 - Td or Tdap) 03/11/2024   Yearly kidney function blood test for diabetes  03/21/2024   Medicare Annual Wellness Visit  06/19/2024   Colon Cancer Screening  11/16/2026   Hepatitis C Screening  Completed   HIV Screening  Completed   Zoster (Shingles) Vaccine  Completed   HPV Vaccine  Aged Out   Meningitis B Vaccine  Aged Out   Pneumococcal Vaccination  Discontinued   COVID-19 Vaccine  Discontinued    Advanced directives: (ACP Link)Information on Advanced Care  Planning can be found at [place]  Secretary of [facility] Advance Health Care Directives Advance Health Care Directives. [email or web address] You may also get the forms at your doctor's office. Advance Care Planning is important because it:  [x]  Makes sure you receive the medical care that is consistent with your values, goals, and preferences  [x]  It provides guidance to your family and loved ones and reduces their decisional burden about whether or not they are making the right decisions based on your wishes.  Follow the link provided in your after visit summary or read over the paperwork we have mailed to you to help you started getting your Advance Directives in place. If you need assistance in completing these, please reach out to us  so that we can help you!  See attachments for Preventive Care and Fall Prevention Tips.   Fall Prevention in the Home, Adult Falls can cause injuries and affect people of all ages. There are many simple things that you can do to make your home safe and to help prevent falls. If you need it, ask for help making these changes. What actions can I take to prevent falls? General information Use good lighting in all rooms. Make sure to: Replace any light bulbs that burn out. Turn on lights if it is dark and use night-lights. Keep items that you use often in easy-to-reach places. Lower the  shelves around your home if needed. Move furniture so that there are clear paths around it. Do not keep throw rugs or other things on the floor that can make you trip. If any of your floors are uneven, fix them. Add color or contrast paint or tape to clearly mark and help you see: Grab bars or handrails. First and last steps of staircases. Where the edge of each step is. If you use a ladder or stepladder: Make sure that it is fully opened. Do not climb a closed ladder. Make sure the sides of the ladder are locked in place. Have someone hold the ladder while you use it. Know where  your pets are as you move through your home. What can I do in the bathroom?     Keep the floor dry. Clean up any water  that is on the floor right away. Remove soap buildup in the bathtub or shower. Buildup makes bathtubs and showers slippery. Use non-skid mats or decals on the floor of the bathtub or shower. Attach bath mats securely with double-sided, non-slip rug tape. If you need to sit down while you are in the shower, use a non-slip stool. Install grab bars by the toilet and in the bathtub and shower. Do not use towel bars as grab bars. What can I do in the bedroom? Make sure that you have a light by your bed that is easy to reach. Do not use any sheets or blankets on your bed that hang to the floor. Have a firm bench or chair with side arms that you can use for support when you get dressed. What can I do in the kitchen? Clean up any spills right away. If you need to reach something above you, use a sturdy step stool that has a grab bar. Keep electrical cables out of the way. Do not use floor polish or wax that makes floors slippery. What can I do with my stairs? Do not leave anything on the stairs. Make sure that you have a light switch at the top and the bottom of the stairs. Have them installed if you do not have them. Make sure that there are handrails on both sides of the stairs. Fix handrails that are broken or loose. Make sure that handrails are as long as the staircases. Install non-slip stair treads on all stairs in your home if they do not have carpet. Avoid having throw rugs at the top or bottom of stairs, or secure the rugs with carpet tape to prevent them from moving. Choose a carpet design that does not hide the edge of steps on the stairs. Make sure that carpet is firmly attached to the stairs. Fix any carpet that is loose or worn. What can I do on the outside of my home? Use bright outdoor lighting. Repair the edges of walkways and driveways and fix any cracks. Clear  paths of anything that can make you trip, such as tools or rocks. Add color or contrast paint or tape to clearly mark and help you see high doorway thresholds. Trim any bushes or trees on the main path into your home. Check that handrails are securely fastened and in good repair. Both sides of all steps should have handrails. Install guardrails along the edges of any raised decks or porches. Have leaves, snow, and ice cleared regularly. Use sand, salt, or ice melt on walkways during winter months if you live where there is ice and snow. In the garage, clean up any spills  right away, including grease or oil spills. What other actions can I take? Review your medicines with your health care provider. Some medicines can make you confused or feel dizzy. This can increase your chance of falling. Wear closed-toe shoes that fit well and support your feet. Wear shoes that have rubber soles and low heels. Use a cane, walker, scooter, or crutches that help you move around if needed. Talk with your provider about other ways that you can decrease your risk of falls. This may include seeing a physical therapist to learn to do exercises to improve movement and strength. Where to find more information Centers for Disease Control and Prevention, STEAD[employer]: TonerPromos.no [employer] on Aging: BaseRingTones.pl National Institute on Aging: BaseRingTones.pl Contact a health care provider if: You are afraid of falling at home. You feel weak, drowsy, or dizzy at home. You fall at home. Get help right away if you: Lose consciousness or have trouble moving after a fall. Have a fall that causes a head injury. These symptoms may be an emergency. Get help right away. Call 911. Do not wait to see if the symptoms will go away. Do not drive yourself to the hospital. This information is not intended to replace advice given to you by your health care provider. Make sure you discuss any questions you have with your health care  provider. Document Revised: 08/30/2021 Document Reviewed: 08/30/2021 Elsevier Patient Education  2024 Else[employer] Inc.  Managing Pain Without Opioids Opioids are strong medicines used to treat moderate to severe pain. For some people, especially those who have long-term (chronic) pain, opioids may not be the best choice for pain management due to: Side effects like nausea, constipation, and sleepiness. The risk of addiction (opioid use disorder). The longer you take opioids, the greater your risk of addiction. Pain that lasts for more than 3 months is called chronic pain. Managing chronic pain usually requires more than one approach and is often provided by a team of health care providers working together (multidisciplinary approach). Pain management may be done at a pain management center or pain clinic. How to manage pain without the use of opioids Use non-opioid medicines Non-opioid medicines for pain may include: Over-the-counter or prescription non-steroidal anti-inflammatory drugs (NSAIDs). These may be the first medicines used for pain. They work well for muscle and bone pain, and they reduce swelling. Acetaminophen . This over-the-counter medicine may work well for milder pain but not swelling. Antidepressants. These may be used to treat chronic pain. A certain type of antidepressant (tricyclics) is often used. These medicines are given in lower doses for pain than when used for depression. Anticonvulsants. These are usually used to treat seizures but may also reduce nerve (neuropathic) pain. Muscle relaxants. These relieve pain caused by sudden muscle tightening (spasms). You may also use a pain medicine that is applied to the skin as a patch, cream, or gel (topical analgesic), such as a numbing medicine. These may cause fewer side effects than medicines taken by mouth. Do certain therapies as directed Some therapies can help with pain management. They include: Physical therapy. You will do  exercises to gain strength and flexibility. A physical therapist may teach you exercises to move and stretch parts of your body that are weak, stiff, or painful. You can learn these exercises at physical therapy visits and practice them at home. Physical therapy may also involve: Massage. Heat wraps or applying heat or cold to affected areas. Electrical signals that interrupt pain signals (transcutaneous electrical nerve stimulation, TENS).  Weak lasers that reduce pain and swelling (low-level laser therapy). Signals from your body that help you learn to regulate pain (biofeedback). Occupational therapy. This helps you to learn ways to function at home and work with less pain. Recreational therapy. This involves trying new activities or hobbies, such as a physical activity or drawing. Mental health therapy, including: Cognitive behavioral therapy (CBT). This helps you learn coping skills for dealing with pain. Acceptance and commitment therapy (ACT) to change the way you think and react to pain. Relaxation therapies, including muscle relaxation exercises and mindfulness-based stress reduction. Pain management counseling. This may be individual, family, or group counseling.  Receive medical treatments Medical treatments for pain management include: Nerve block injections. These may include a pain blocker and anti-inflammatory medicines. You may have injections: Near the spine to relieve chronic back or neck pain. Into joints to relieve back or joint pain. Into nerve areas that supply a painful area to relieve body pain. Into muscles (trigger point injections) to relieve some painful muscle conditions. A medical device placed near your spine to help block pain signals and relieve nerve pain or chronic back pain (spinal cord stimulation device). Acupuncture. Follow these instructions at home Medicines Take over-the-counter and prescription medicines only as told by your health care provider. If  you are taking pain medicine, ask your health care providers about possible side effects to watch out for. Do not drive or use heavy machinery while taking prescription opioid pain medicine. Lifestyle  Do not use drugs or alcohol to reduce pain. If you drink alcohol, limit how much you have to: 0-1 drink a day for women who are not pregnant. 0-2 drinks a day for men. Know how much alcohol is in a drink. In the [place] one drink equals one 12 oz bottle of beer (355 mL), one 5 oz glass of wine (148 mL), or one 1 oz glass of hard liquor (44 mL). Do not use any products that contain nicotine or tobacco. These products include cigarettes, chewing tobacco, and vaping devices, such as e-cigarettes. If you need help quitting, ask your health care provider. Eat a healthy diet and maintain a healthy weight. Poor diet and excess weight may make pain worse. Eat foods that are high in fiber. These include fresh fruits and vegetables, whole grains, and beans. Limit foods that are high in fat and processed sugars, such as fried and sweet foods. Exercise regularly. Exercise lowers stress and may help relieve pain. Ask your health care provider what activities and exercises are safe for you. If your health care provider approves, join an exercise class that combines movement and stress reduction. Examples include yoga and tai chi. Get enough sleep. Lack of sleep may make pain worse. Lower stress as much as possible. Practice stress reduction techniques as told by your therapist. General instructions Work with all your pain management providers to find the treatments that work best for you. You are an important member of your pain management team. There are many things you can do to reduce pain on your own. Consider joining an online or in-person support group for people who have chronic pain. Keep all follow-up visits. This is important. Where to find more information You can find more information about managing  pain without opioids from: American Academy of Pain Medicine: painmed.org Institute for Chronic Pain: instituteforchronicpain.org American Chronic Pain Association: theacpa.org Contact a health care provider if: You have side effects from pain medicine. Your pain gets worse or does not get better  with treatments or home therapy. You are struggling with anxiety or depression. Summary Many types of pain can be managed without opioids. Chronic pain may respond better to pain management without opioids. Pain is best managed when you and a team of health care providers work together. Pain management without opioids may include non-opioid medicines, medical treatments, physical therapy, mental health therapy, and lifestyle changes. Tell your health care providers if your pain gets worse or is not being managed well enough. This information is not intended to replace advice given to you by your health care provider. Make sure you discuss any questions you have with your health care provider. Document Revised: 04/08/2020 Document Reviewed: 04/08/2020 Elsevier Patient Education  2024 [employer].
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
## 2023-06-23 ENCOUNTER — Ambulatory Visit: Payer: No Typology Code available for payment source | Attending: Internal Medicine | Admitting: Internal Medicine

## 2023-06-23 ENCOUNTER — Other Ambulatory Visit: Payer: Self-pay

## 2023-06-23 ENCOUNTER — Encounter: Payer: Self-pay | Admitting: Internal Medicine

## 2023-06-23 ENCOUNTER — Other Ambulatory Visit (HOSPITAL_COMMUNITY): Payer: Self-pay

## 2023-06-23 VITALS — BP 112/66 | HR 78 | Resp 14 | Ht 70.0 in | Wt 243.0 lb

## 2023-06-23 DIAGNOSIS — Z79899 Other long term (current) drug therapy: Secondary | ICD-10-CM

## 2023-06-23 DIAGNOSIS — M1990 Unspecified osteoarthritis, unspecified site: Secondary | ICD-10-CM | POA: Diagnosis not present

## 2023-06-23 DIAGNOSIS — M0609 Rheumatoid arthritis without rheumatoid factor, multiple sites: Secondary | ICD-10-CM | POA: Diagnosis not present

## 2023-06-23 DIAGNOSIS — E559 Vitamin D deficiency, unspecified: Secondary | ICD-10-CM

## 2023-06-23 DIAGNOSIS — F17200 Nicotine dependence, unspecified, uncomplicated: Secondary | ICD-10-CM

## 2023-06-23 MED ORDER — FOLIC ACID 1 MG PO TABS
1.0000 mg | ORAL_TABLET | Freq: Every day | ORAL | 3 refills | Status: AC
  Filled 2023-06-23 – 2023-07-30 (×3): qty 90, 90d supply, fill #0
  Filled 2023-10-28: qty 90, 90d supply, fill #1
  Filled 2024-02-03: qty 90, 90d supply, fill #2

## 2023-06-23 MED ORDER — HYDROXYCHLOROQUINE SULFATE 200 MG PO TABS
200.0000 mg | ORAL_TABLET | Freq: Two times a day (BID) | ORAL | 0 refills | Status: DC
  Filled 2023-06-23 – 2023-06-27 (×3): qty 180, 90d supply, fill #0

## 2023-06-23 MED ORDER — METHOTREXATE SODIUM 2.5 MG PO TABS
20.0000 mg | ORAL_TABLET | ORAL | 0 refills | Status: DC
  Filled 2023-06-23: qty 96, 84d supply, fill #0
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
## 2023-06-24 LAB — CBC WITH DIFFERENTIAL/PLATELET
Absolute Lymphocytes: 3463 {cells}/uL (ref 850–3900)
Absolute Monocytes: 485 {cells}/uL (ref 200–950)
Basophils Absolute: 78 {cells}/uL (ref 0–200)
Basophils Relative: 0.8 %
Eosinophils Absolute: 165 {cells}/uL (ref 15–500)
Eosinophils Relative: 1.7 %
HCT: 43.8 % (ref 38.5–50.0)
Hemoglobin: 14.9 g/dL (ref 13.2–17.1)
MCH: 32.4 pg (ref 27.0–33.0)
MCHC: 34 g/dL (ref 32.0–36.0)
MCV: 95.2 fL (ref 80.0–100.0)
MPV: 8.8 fL (ref 7.5–12.5)
Monocytes Relative: 5 %
Neutro Abs: 5510 {cells}/uL (ref 1500–7800)
Neutrophils Relative %: 56.8 %
Platelets: 409 10*3/uL — ABNORMAL HIGH (ref 140–400)
RBC: 4.6 10*6/uL (ref 4.20–5.80)
RDW: 13.3 % (ref 11.0–15.0)
Total Lymphocyte: 35.7 %
WBC: 9.7 10*3/uL (ref 3.8–10.8)

## 2023-06-24 LAB — COMPREHENSIVE METABOLIC PANEL WITH GFR
AG Ratio: 2.2 (calc) (ref 1.0–2.5)
ALT: 15 U/L (ref 9–46)
AST: 13 U/L (ref 10–35)
Albumin: 4.7 g/dL (ref 3.6–5.1)
Alkaline phosphatase (APISO): 50 U/L (ref 35–144)
BUN: 11 mg/dL (ref 7–25)
CO2: 24 mmol/L (ref 20–32)
Calcium: 9.3 mg/dL (ref 8.6–10.3)
Chloride: 104 mmol/L (ref 98–110)
Creat: 0.87 mg/dL (ref 0.70–1.30)
Globulin: 2.1 g/dL (ref 1.9–3.7)
Glucose, Bld: 281 mg/dL — ABNORMAL HIGH (ref 65–99)
Potassium: 4.5 mmol/L (ref 3.5–5.3)
Sodium: 139 mmol/L (ref 135–146)
Total Bilirubin: 0.3 mg/dL (ref 0.2–1.2)
Total Protein: 6.8 g/dL (ref 6.1–8.1)
eGFR: 103 mL/min/{1.73_m2} (ref >=60)

## 2023-06-24 LAB — C-REACTIVE PROTEIN: CRP: 7.3 mg/L (ref <8.0)

## 2023-06-24 LAB — C3 AND C4
C3 Complement: 175 mg/dL (ref 82–185)
C4 Complement: 30 mg/dL (ref 15–53)

## 2023-06-24 LAB — SEDIMENTATION RATE: Sed Rate: 9 mm/h (ref 0–20)

## 2023-06-27 ENCOUNTER — Other Ambulatory Visit: Payer: Self-pay

## 2023-07-07 ENCOUNTER — Other Ambulatory Visit: Payer: Self-pay

## 2023-07-07 ENCOUNTER — Ambulatory Visit (INDEPENDENT_AMBULATORY_CARE_PROVIDER_SITE_OTHER): Admitting: Nurse Practitioner

## 2023-07-07 ENCOUNTER — Encounter: Payer: Self-pay | Admitting: Nurse Practitioner

## 2023-07-07 VITALS — BP 126/76 | HR 92 | Ht 71.0 in | Wt 240.8 lb

## 2023-07-07 DIAGNOSIS — Z7984 Long term (current) use of oral hypoglycemic drugs: Secondary | ICD-10-CM | POA: Diagnosis not present

## 2023-07-07 DIAGNOSIS — E119 Type 2 diabetes mellitus without complications: Secondary | ICD-10-CM | POA: Diagnosis not present

## 2023-07-07 DIAGNOSIS — E559 Vitamin D deficiency, unspecified: Secondary | ICD-10-CM | POA: Diagnosis not present

## 2023-07-07 DIAGNOSIS — E782 Mixed hyperlipidemia: Secondary | ICD-10-CM | POA: Diagnosis not present

## 2023-07-07 DIAGNOSIS — I10 Essential (primary) hypertension: Secondary | ICD-10-CM | POA: Diagnosis not present

## 2023-07-07 LAB — POCT GLYCOSYLATED HEMOGLOBIN (HGB A1C): Hemoglobin A1C: 8.5 % — AB (ref 4.0–5.6)

## 2023-07-07 MED ORDER — METFORMIN HCL ER 500 MG PO TB24
1000.0000 mg | ORAL_TABLET | Freq: Every day | ORAL | 3 refills | Status: DC
  Filled 2023-07-07 – 2023-07-30 (×2): qty 180, 90d supply, fill #0
  Filled 2023-10-28: qty 180, 90d supply, fill #1

## 2023-07-07 MED ORDER — FREESTYLE LIBRE 3 PLUS SENSOR MISC
0 refills | Status: DC
  Filled 2023-07-07: qty 2, 28d supply, fill #0
  Filled 2023-08-22 (×2): qty 6, 90d supply, fill #0

## 2023-07-07 MED ORDER — GLIPIZIDE ER 5 MG PO TB24
5.0000 mg | ORAL_TABLET | Freq: Every day | ORAL | 3 refills | Status: DC
  Filled 2023-07-07 – 2023-08-20 (×2): qty 90, 90d supply, fill #0
  Filled 2023-11-18: qty 90, 90d supply, fill #1

## 2023-07-07 MED ORDER — TRESIBA FLEXTOUCH 100 UNIT/ML ~~LOC~~ SOPN
20.0000 [IU] | PEN_INJECTOR | Freq: Every day | SUBCUTANEOUS | 3 refills | Status: DC
  Filled 2023-07-07: qty 18, 90d supply, fill #0
  Filled 2023-10-17: qty 18, 90d supply, fill #1

## 2023-07-07 MED ORDER — INSULIN PEN NEEDLE 32G X 6 MM MISC
3 refills | Status: AC
  Filled 2023-07-07: qty 100, 90d supply, fill #0
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
## 2023-07-12 ENCOUNTER — Other Ambulatory Visit: Payer: Self-pay

## 2023-07-13 ENCOUNTER — Other Ambulatory Visit (HOSPITAL_COMMUNITY): Payer: Self-pay

## 2023-07-13 ENCOUNTER — Telehealth: Payer: Self-pay

## 2023-07-13 ENCOUNTER — Other Ambulatory Visit: Payer: Self-pay

## 2023-07-13 NOTE — Telephone Encounter (Signed)
 Pharmacy Patient Advocate Encounter   Received notification from CoverMyMeds that prior authorization for Freestyle libre 3 plus is required/requested.   Insurance verification completed.   The patient is insured through [employer] .   Per test claim: The current 30 day co-pay is, $0.  No PA needed at this time. This test claim was processed through [facility]- copay amounts may vary at other pharmacies due to pharmacy/plan contracts, or as the patient moves through the different stages of their insurance plan.

## 2023-07-17 ENCOUNTER — Ambulatory Visit

## 2023-07-25 ENCOUNTER — Ambulatory Visit

## 2023-07-27 DIAGNOSIS — Z135 Encounter for screening for eye and ear disorders: Secondary | ICD-10-CM | POA: Diagnosis not present

## 2023-07-27 DIAGNOSIS — H524 Presbyopia: Secondary | ICD-10-CM | POA: Diagnosis not present

## 2023-07-27 LAB — HM DIABETES EYE EXAM

## 2023-07-30 ENCOUNTER — Other Ambulatory Visit: Payer: Self-pay

## 2023-08-10 ENCOUNTER — Ambulatory Visit: Attending: Nurse Practitioner | Admitting: Nurse Practitioner

## 2023-08-10 ENCOUNTER — Encounter: Payer: Self-pay | Admitting: Nurse Practitioner

## 2023-08-10 ENCOUNTER — Other Ambulatory Visit: Payer: Self-pay

## 2023-08-10 VITALS — BP 133/75 | HR 87 | Temp 98.3°F | Resp 16 | Ht 71.0 in | Wt 247.0 lb

## 2023-08-10 DIAGNOSIS — M5412 Radiculopathy, cervical region: Secondary | ICD-10-CM | POA: Insufficient documentation

## 2023-08-10 DIAGNOSIS — M542 Cervicalgia: Secondary | ICD-10-CM | POA: Insufficient documentation

## 2023-08-10 DIAGNOSIS — M1990 Unspecified osteoarthritis, unspecified site: Secondary | ICD-10-CM | POA: Insufficient documentation

## 2023-08-10 DIAGNOSIS — G894 Chronic pain syndrome: Secondary | ICD-10-CM | POA: Insufficient documentation

## 2023-08-10 DIAGNOSIS — Z79899 Other long term (current) drug therapy: Secondary | ICD-10-CM | POA: Diagnosis not present

## 2023-08-10 DIAGNOSIS — Q761 Klippel-Feil syndrome: Secondary | ICD-10-CM | POA: Diagnosis not present

## 2023-08-10 MED ORDER — HYDROCODONE-ACETAMINOPHEN 7.5-325 MG PO TABS
1.0000 | ORAL_TABLET | Freq: Four times a day (QID) | ORAL | 0 refills | Status: DC | PRN
Start: 2023-10-11 — End: 2023-11-08
  Filled 2023-10-12: qty 120, 30d supply, fill #0

## 2023-08-10 MED ORDER — HYDROCODONE-ACETAMINOPHEN 7.5-325 MG PO TABS
1.0000 | ORAL_TABLET | Freq: Four times a day (QID) | ORAL | 0 refills | Status: AC | PRN
  Filled 2023-08-11: qty 120, 30d supply, fill #0
  Filled ????-??-??: fill #0

## 2023-08-10 MED ORDER — HYDROCODONE-ACETAMINOPHEN 7.5-325 MG PO TABS
1.0000 | ORAL_TABLET | Freq: Four times a day (QID) | ORAL | 0 refills | Status: AC | PRN
  Filled 2023-09-11 (×2): qty 120, 30d supply, fill #0

## 2023-08-10 NOTE — Progress Notes (Signed)
 PROVIDER NOTE: Interpretation of information contained herein should be left to medically-trained personnel. Specific patient instructions are provided elsewhere under Patient Instructions section of medical record. This document was created in part using AI and STT-dictation technology, any transcriptional errors that may result from this process are unintentional.  Patient: Tony Cardenas  Service: E/M   PCP: Vicci Duwaine SQUIBB, DO  DOB: [birth year]  DOS: 08/10/2023  Provider: Emmy MARLA Blanch, NP  MRN: [id number]  Delivery: Face-to-face  Specialty: Interventional Pain Management  Type: Established Patient  Setting: Ambulatory outpatient facility  Specialty designation: 09  Referring Prov.: Vicci Duwaine SQUIBB, DO  Location: Outpatient office facility       History of present illness (HPI) Tony Cardenas, a 53 y.o. year old male, is here today because of his Cervical radicular pain [M54.12]. Tony Cardenas primary complain today is Neck Pain  Pertinent problems: Tony Cardenas has S/P cervical spinal fusion; Cervical radicular pain; Cervical fusion syndrome; Cervical spondylosis with myelopathy and radiculopathy; and Chronic pain syndrome on their pertinent problem list  Pain Assessment: Severity of Chronic pain is reported as a 8 /10. Location: Neck  /through shoulders down arms to fingers bilaterally. Onset: More than a month ago. Quality: Aching, Dull. Timing: Constant. Modifying factor(s): meds, rest. Vitals:  height is 5' 11 (1.803 m) and weight is 247 lb (112 kg). His temperature is 98.3 F (36.8 C). His blood pressure is 133/75 and his pulse is 87. His respiration is 16 and oxygen saturation is 98%.  BMI: Estimated body mass index is 34.45 kg/m as calculated from the following:   Height as of this encounter: 5' 11 (1.803 m).   Weight as of this encounter: 247 lb (112 kg).  Last encounter: 05/11/2023. Last procedure: 03/01/2023  Reason for encounter: evaluation for possible  interventional PM therapy/treatment and medication management. No change in medical history since last visit. Patient's pain is at baseline. Patient continues multimodal pain regimen as prescribed. States that it provides pain relief and improvement in functional status. He is currently taking hydrocodone  for pain management.   Of note, the patient had a good response to cervical epidural injection in the past, with significant pain relief and functional improvement.  Due to recurrent flareup of neck pain radiating to both shoulders and arms, we discussed repeating the cervical epidural injection and patient agreed with the plan.   Pharmacotherapy Assessment   Hydrocodone -acetaminophen  (Norco) 7.5-325 mg tablet every 6 hours as needed for moderate pain. MME=30 Monitoring: [place] PMP: PDMP reviewed during this encounter.       Pharmacotherapy: No side-effects or adverse reactions reported. Compliance: No problems identified. Effectiveness: Clinically acceptable.  Dayna Pulling, RN  08/10/2023  7:52 AM  Sign when Signing Visit Nursing Pain Medication Assessment:  Safety precautions to be maintained throughout the outpatient stay will include: orient to surroundings, keep bed in low position, maintain call bell within reach at all times, provide assistance with transfer out of bed and ambulation.  Medication Inspection Compliance: Pill count conducted under aseptic conditions, in front of the patient. Neither the pills nor the bottle was removed from the patient's sight at any time. Once count was completed pills were immediately returned to the patient in their original bottle.  Medication: Hydrocodone /APAP Pill/Patch Count: 7 of 102 pills/patches remain Pill/Patch Appearance: Markings consistent with prescribed medication Bottle Appearance: Standard pharmacy container. Clearly labeled. Filled Date: 7 / 3 / 2025 Last Medication intake:  Today    UDS:  Summary  Date Value  Ref Range Status   02/14/2023 FINAL  Final    Comment:    ==================================================================== [employer] Select 13 (MW) ==================================================================== Test                             Result       Flag       Units  Drug Present and Declared for Prescription Verification   Hydrocodone                     786          EXPECTED   ng/mg creat   Hydromorphone                   264          EXPECTED   ng/mg creat   Dihydrocodeine                 82           EXPECTED   ng/mg creat   Norhydrocodone                 542          EXPECTED   ng/mg creat    Sources of hydrocodone  include scheduled prescription medications.    Hydromorphone , dihydrocodeine and norhydrocodone are expected    metabolites of hydrocodone . Hydromorphone  and dihydrocodeine are    also available as scheduled prescription medications.  Drug Absent but Declared for Prescription Verification   Diazepam                        Not Detected UNEXPECTED ng/mg creat ==================================================================== Test                      Result    Flag   Units      Ref Range   Creatinine              187              mg/dL      >=79 ==================================================================== Declared Medications:  The flagging and interpretation on this report are based on the  following declared medications.  Unexpected results may arise from  inaccuracies in the declared medications.   **Note: The testing scope of this panel includes these medications:   Diazepam  (Valium )  Hydrocodone  (Norco)   **Note: The testing scope of this panel does not include the  following reported medications:   Acetaminophen  (Norco)  Albuterol  (Ventolin  HFA)  Ezetimibe  (Zetia )  Fluticasone  (Flonase )  Folic Acid  (Folvite )  Glipizide  (Glucotrol )  Hydroxychloroquine  (Plaquenil )  Lisinopril  (Zestril )  Metformin  (Glucophage )  Methocarbamol  (Robaxin )   Methotrexate   Omega-3-Acid  Ethyl Esters (Lovaza )  Prednisone  (Deltasone )  Vitamin D2 (Drisdol ) ==================================================================== For clinical consultation, please call [phone]. ====================================================================     No results found for: CBDTHCR No results found for: D8THCCBX No results found for: D9THCCBX  ROS  Constitutional: Denies any fever or chills Gastrointestinal: No reported hemesis, hematochezia, vomiting, or acute GI distress Musculoskeletal: Neck pain, right shoulder pain Neurological: No reported episodes of acute onset apraxia, aphasia, dysarthria, agnosia, amnesia, paralysis, loss of coordination, or loss of consciousness  Medication Review  FreeStyle Freedom Lite, FreeStyle Libre 3 Plus Sensor, HYDROcodone -acetaminophen , Insulin  Pen Needle, albuterol , ezetimibe , fenofibrate , fluticasone , folic acid , freestyle, glipiZIDE , glucose blood, hydroxychloroquine , insulin  degludec, lisinopril , metFORMIN , methocarbamol , methotrexate , omega-3 acid ethyl esters, and rosuvastatin   History Review  Allergy: Tony Cardenas  is allergic to atorvastatin , hydromorphone  hcl, and semaglutide . Drug: Tony Cardenas  reports no history of drug use. Alcohol:  reports no history of alcohol use. Tobacco:  reports that he has been smoking cigarettes. He started smoking about 24 years ago. He has a 21 pack-year smoking history. He has been exposed to tobacco smoke. He quit smokeless tobacco use about 7 years ago.  His smokeless tobacco use included chew. Social: Tony Cardenas  reports that he has been smoking cigarettes. He started smoking about 24 years ago. He has a 21 pack-year smoking history. He has been exposed to tobacco smoke. He quit smokeless tobacco use about 7 years ago.  His smokeless tobacco use included chew. He reports that he does not drink alcohol and does not use drugs. Medical:  has a past medical history  of Arthritis, COPD (chronic obstructive pulmonary disease) (HCC), Diabetes mellitus without complication (HCC), GERD (gastroesophageal reflux disease), Hyperlipidemia, Hypertension, and Neck pain (1989). Surgical: Tony Cardenas  has a past surgical history that includes Neck surgery (1989); Knee surgery (Right); Colonoscopy (N/A, 06/02/2014); Polypectomy (06/02/2014); Appendectomy; Esophagogastroduodenoscopy (egd) with propofol  (N/A, 07/29/2016); Back surgery; Anterior cervical corpectomy (N/A, 10/10/2018); Hernia repair (over 10 years ago); Anterior cervical corpectomy (N/A, 05/01/2019); Posterior cervical fusion/foraminotomy (N/A, 05/01/2019); Colonoscopy with propofol  (N/A, 11/15/2021); Spine surgery; and Cholecystectomy. Family: family history includes Alcohol abuse in his father; COPD in his father; Cancer in his mother; Diabetes in his brother, father, and paternal grandmother.  Laboratory Chemistry Profile   Renal Lab Results  Component Value Date   BUN 11 06/23/2023   CREATININE 0.87 06/23/2023   BCR SEE NOTE: 06/23/2023   GFRAA 114 12/16/2019   GFRNONAA >60 11/11/2021    Hepatic Lab Results  Component Value Date   AST 13 06/23/2023   ALT 15 06/23/2023   ALBUMIN 4.2 03/22/2023   ALKPHOS 63 03/22/2023   HCVAB NON REACTIVE 03/24/2020    Electrolytes Lab Results  Component Value Date   NA 139 06/23/2023   K 4.5 06/23/2023   CL 104 06/23/2023   CALCIUM  9.3 06/23/2023    Bone Lab Results  Component Value Date   VD25OH 26.7 (L) 03/22/2023    Inflammation (CRP: Acute Phase) (ESR: Chronic Phase) Lab Results  Component Value Date   CRP 7.3 06/23/2023   ESRSEDRATE 9 06/23/2023         Note: Above Lab results reviewed.  Recent Imaging Review  DG Hand Complete Left CLINICAL DATA:  Twisted left hand the wrong way when working on a car. Most of pain in the palm of hand and within the wrist.  EXAM: LEFT WRIST - COMPLETE 3+ VIEW; LEFT HAND - COMPLETE 3+ VIEW  COMPARISON:   Left hand radiographs 05/05/2017  FINDINGS: Left wrist:  Neutral ulnar variance. Moderate thumb carpometacarpal joint space narrowing and peripheral osteophytosis. Mild triscaphe joint space narrowing and peripheral spurring. Mild thumb metacarpophalangeal joint space narrowing and peripheral osteophytosis. No acute fracture is seen. No dislocation.  Left hand:  Moderate thumb interphalangeal joint space narrowing and dorsolateral osteophytosis, similar to prior. No acute fracture is seen. No dislocation. The cortices are intact.  IMPRESSION: 1. No acute fracture. 2. 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  Electronically Signed   By: Tanda Lyons M.D.   On: 04/25/2023 13:13 DG Wrist Complete Left CLINICAL DATA:  Twisted left hand the wrong way when working on a car. Most of pain in the palm of hand and within the wrist.  EXAM: LEFT WRIST - COMPLETE 3+  VIEW; LEFT HAND - COMPLETE 3+ VIEW  COMPARISON:  Left hand radiographs 05/05/2017  FINDINGS: Left wrist:  Neutral ulnar variance. Moderate thumb carpometacarpal joint space narrowing and peripheral osteophytosis. Mild triscaphe joint space narrowing and peripheral spurring. Mild thumb metacarpophalangeal joint space narrowing and peripheral osteophytosis. No acute fracture is seen. No dislocation.  Left hand:  Moderate thumb interphalangeal joint space narrowing and dorsolateral osteophytosis, similar to prior. No acute fracture is seen. No dislocation. The cortices are intact.  IMPRESSION: 1. No acute fracture. 2. 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  Electronically Signed   By: Tanda Lyons M.D.   On: 04/25/2023 13:13 Note: Reviewed        Physical Exam  Vitals: BP 133/75   Pulse 87   Temp 98.3 F (36.8 C)   Resp 16   Ht 5' 11 (1.803 m)   Wt 247 lb (112 kg)   SpO2 98%   BMI 34.45 kg/m  BMI: Estimated body mass index is 34.45 kg/m as  calculated from the following:   Height as of this encounter: 5' 11 (1.803 m).   Weight as of this encounter: 247 lb (112 kg). Ideal: Ideal body weight: 75.3 kg (166 lb 0.1 oz) Adjusted ideal body weight: 90 kg (198 lb 6.5 oz) General appearance: Well nourished, well developed, and well hydrated. In no apparent acute distress Mental status: Alert, oriented x 3 (person, place, & time)       Respiratory: No evidence of acute respiratory distress Eyes: PERLA Cervical Spine Exam  Skin & Axial Inspection: No masses, redness, edema, swelling, or associated skin lesions Alignment: Symmetrical Functional ROM: Pain restricted ROM, bilaterally Stability: No instability detected Muscle Tone/Strength: Functionally intact. No obvious neuro-muscular anomalies detected. Sensory (Neurological): Neurogenic pain pattern Palpation: No palpable anomalies               Upper Extremity (UE) Exam      Right  Left  Inspection    Skin color, temperature, and hair growth are WNL. No peripheral edema or cyanosis. No masses, redness, swelling, asymmetry, or associated skin lesions. No contractures.  Skin color, temperature, and hair growth are WNL. No peripheral edema or cyanosis. No masses, redness, swelling, asymmetry, or associated skin lesions. No contractures.          Functional ROM    Unrestricted ROM          Unrestricted ROM                  Muscle Tone/Strength    Functionally intact. No obvious neuro-muscular anomalies detected.  Functionally intact. No obvious neuro-muscular anomalies detected.          Sensory (Neurological)    Unimpaired          Unimpaired                  Palpation    No palpable anomalies              No palpable anomalies                      Maneuver Shoulder abduction (deltoid/supraspinatus, axillary/supra scapular n,, C5) Elbow flexion (biceps brachial, musculoskeletal n, C5-6) Elbow extension (triceps, radial n, C7) Finger abduction (interossei, ulnar n, T1)    Shoulder  abduction (deltoid/supraspinatus, axillary/supra scapular n,, C5) Elbow flexion (biceps brachial, musculoskeletal n, C5-6) Elbow extension (triceps, radial n, C7) Wrist extensors (C6) Finger extensors (C8) Finger abduction (interossei, ulnar n, T1)  Provocative Test    Phalen's test: deferred Tinel's test: deferred Apley's scratch test (touch opposite shoulder):  Action 1 (Across chest): deferred Action 2 (Overhead): deferred Action 3 (LB reach): deferred  Phalen's test: deferred Tinel's test: deferred Apley's scratch test (touch opposite shoulder):  Action 1 (Across chest): deferred Action 2 (Overhead): deferred Action 3 (LB reach): deferred             Level  Myotome  Dermatome  Sclerotome  ROM  C5  Elbow flexion  Lateral upper arm      C6  Wrist extension  Thumb and index      C7  Elbow extension  Middle finger      C8  Finger extension  Ring and pinky finger      T1  Finger abduction  Medial elbow and axilla                                                                                                                                         Assessment   Diagnosis Status  1. Cervical radicular pain   2. Cervicalgia   3. Chronic pain syndrome   4. Cervical fusion syndrome   5. Chronic inflammatory arthritis   6. Medication management    Having a Flare-up Having a Flare-up Controlled   Updated Problems: No problems updated.  Plan of Care  Problem-specific:  Assessment and Plan We will continue on current medication regimen.  Prescribing drug monitoring (PDMP) reviewed; findings consistent with the use of prescribed medication and no evidence of narcotic misuse or abuse.  Urine drug screening (UDS) up-to-date.  Schedule follow-up in 90 days for medication management.  Plan: C-ESI # 2 in clinic with Dr. Marcelino    Mr. GERRAD WELKER has a current medication list which includes the following long-term medication(s): albuterol , ezetimibe ,  fenofibrate , fluticasone , glipizide , lisinopril , metformin , omega-3 acid ethyl esters, and rosuvastatin .  Pharmacotherapy (Medications Ordered): Meds ordered this encounter  Medications   HYDROcodone -acetaminophen  (NORCO) 7.5-325 MG tablet    Sig: Take 1 tablet by mouth every 6 (six) hours as needed for moderate pain (pain score 4-6).    Dispense:  120 tablet    Refill:  0    Fill med 1 day early (08/11/2023) for this month.   HYDROcodone -acetaminophen  (NORCO) 7.5-325 MG tablet    Sig: Take 1 tablet by mouth every 6 (six) hours as needed for moderate pain (pain score 4-6).    Dispense:  120 tablet    Refill:  0    Per Sylvan Charm- Dr Marcelino is okay with [facility] fill med on Friday 04/14/23 due to pharmacy being closed on Saturday.   HYDROcodone -acetaminophen  (NORCO) 7.5-325 MG tablet    Sig: Take 1 tablet by mouth every 6 (six) hours as needed for moderate pain (pain score 4-6).    Dispense:  120 tablet    Refill:  0  Per Sylvan Charm- Dr Marcelino is okay with [facility] fill med on Friday 04/14/23 due to pharmacy being closed on Saturday.   Orders:  Orders Placed This Encounter  Procedures   Cervical Epidural Injection    Sedation: Patient's choice. Purpose: Diagnostic/Therapeutic Indication(s): Radiculitis and cervicalgia associater with cervical degenerative disc disease.    Standing Status:   Future    Expected Date:   08/24/2023    Expiration Date:   08/09/2024    Scheduling Instructions:     Procedure: Cervical Epidural Steroid Injection/Block     Level(s): C7-T1     Laterality: Midline     Timeframe: As soon as schedule allows.    Where will this procedure be performed?:   [facility]             by Dr. Tanya        Return in about 3 months (around 11/10/2023) for (F2F), (MM), Emmy Blanch NP.    Recent Visits No visits were found meeting these conditions. Showing recent visits within past 90 days and meeting all other requirements Today's Visits Date Type Provider Dept   08/10/23 Office Visit Janiyah Beery K, NP [facility]  Showing today's visits and meeting all other requirements Future Appointments No visits were found meeting these conditions. Showing future appointments within next 90 days and meeting all other requirements  I discussed the assessment and treatment plan with the patient. The patient was provided an opportunity to ask questions and all were answered. The patient agreed with the plan and demonstrated an understanding of the instructions.  Patient advised to call back or seek an in-person evaluation if the symptoms or condition worsens.  Duration of encounter: 30 minutes.  Total time on encounter, as per AMA guidelines included both the face-to-face and non-face-to-face time personally spent by the physician and/or other qualified health care professional(s) on the day of the encounter (includes time in activities that require the physician or other qualified health care professional and does not include time in activities normally performed by clinical staff). Physician's time may include the following activities when performed: Preparing to see the patient (e.g., pre-charting review of records, searching for previously ordered imaging, lab work, and nerve conduction tests) Review of prior analgesic pharmacotherapies. Reviewing PMP Interpreting ordered tests (e.g., lab work, imaging, nerve conduction tests) Performing post-procedure evaluations, including interpretation of diagnostic procedures Obtaining and/or reviewing separately obtained history Performing a medically appropriate examination and/or evaluation Counseling and educating the patient/family/caregiver Ordering medications, tests, or procedures Referring and communicating with other health care professionals (when not separately reported) Documenting clinical information in the electronic or other health record Independently interpreting results (not separately  reported) and communicating results to the patient/ family/caregiver Care coordination (not separately reported)  Note by: Daisha Filosa K Maximillian Habibi, NP (TTS and AI technology used. I apologize for any typographical errors that were not detected and corrected.) Date: 08/10/2023; Time: 8:33 AM

## 2023-08-10 NOTE — Patient Instructions (Signed)

## 2023-08-10 NOTE — Progress Notes (Signed)
 Nursing Pain Medication Assessment:  Safety precautions to be maintained throughout the outpatient stay will include: orient to surroundings, keep bed in low position, maintain call bell within reach at all times, provide assistance with transfer out of bed and ambulation.  Medication Inspection Compliance: Pill count conducted under aseptic conditions, in front of the patient. Neither the pills nor the bottle was removed from the patient's sight at any time. Once count was completed pills were immediately returned to the patient in their original bottle.  Medication: Hydrocodone /APAP Pill/Patch Count: 7 of 102 pills/patches remain Pill/Patch Appearance: Markings consistent with prescribed medication Bottle Appearance: Standard pharmacy container. Clearly labeled. Filled Date: 7 / 3 / 2025 Last Medication intake:  Today

## 2023-08-11 ENCOUNTER — Other Ambulatory Visit: Payer: Self-pay

## 2023-08-20 ENCOUNTER — Other Ambulatory Visit: Payer: Self-pay

## 2023-08-22 ENCOUNTER — Other Ambulatory Visit: Payer: Self-pay

## 2023-08-30 ENCOUNTER — Ambulatory Visit: Attending: Nurse Practitioner | Admitting: Student in an Organized Health Care Education/Training Program

## 2023-08-31 ENCOUNTER — Telehealth: Payer: Self-pay | Admitting: *Deleted

## 2023-08-31 NOTE — Telephone Encounter (Signed)
 Patient's wife called back and shared that she was at work and to call him, That he had experienced the same thing last night, and again this morning.  I called the patient , the call went straight to voicemail, I left a detailed message for Chyrl, ask him to please call the office.

## 2023-08-31 NOTE — Telephone Encounter (Signed)
 Patient's wife left a message that patient's BS are in the 60's during the middle of the night and day time to. She states in the message that he has stopped his shot.  I have called the patient. Left a voicemail to call the office back.

## 2023-09-06 ENCOUNTER — Ambulatory Visit (HOSPITAL_BASED_OUTPATIENT_CLINIC_OR_DEPARTMENT_OTHER): Admitting: Student in an Organized Health Care Education/Training Program

## 2023-09-06 ENCOUNTER — Encounter: Payer: Self-pay | Admitting: Student in an Organized Health Care Education/Training Program

## 2023-09-06 ENCOUNTER — Ambulatory Visit
Admission: RE | Admit: 2023-09-06 | Discharge: 2023-09-06 | Disposition: A | Discharge status: Home / Self Care | Source: Ambulatory Visit | Attending: Student in an Organized Health Care Education/Training Program | Admitting: Student in an Organized Health Care Education/Training Program

## 2023-09-06 VITALS — BP 120/62 | HR 80 | Temp 97.2°F | Resp 16 | Ht 71.0 in | Wt 240.0 lb

## 2023-09-06 DIAGNOSIS — M542 Cervicalgia: Secondary | ICD-10-CM

## 2023-09-06 DIAGNOSIS — G894 Chronic pain syndrome: Secondary | ICD-10-CM | POA: Diagnosis not present

## 2023-09-06 DIAGNOSIS — M5412 Radiculopathy, cervical region: Secondary | ICD-10-CM | POA: Insufficient documentation

## 2023-09-06 MED ORDER — ROPIVACAINE HCL 2 MG/ML IJ SOLN
1.0000 mL | Freq: Once | INTRAMUSCULAR | Status: AC
  Administered 2023-09-06: 1 mL via EPIDURAL
  Filled 2023-09-06: qty 20

## 2023-09-06 MED ORDER — LIDOCAINE HCL 2 % IJ SOLN
20.0000 mL | Freq: Once | INTRAMUSCULAR | Status: AC
  Administered 2023-09-06: 400 mg
  Filled 2023-09-06: qty 20

## 2023-09-06 MED ORDER — DEXAMETHASONE SODIUM PHOSPHATE 10 MG/ML IJ SOLN
10.0000 mg | Freq: Once | INTRAMUSCULAR | Status: AC
  Administered 2023-09-06: 10 mg
  Filled 2023-09-06: qty 1

## 2023-09-06 MED ORDER — LACTATED RINGERS IV SOLN: Freq: Once | INTRAVENOUS | Status: DC

## 2023-09-06 MED ORDER — SODIUM CHLORIDE 0.9% FLUSH
1.0000 mL | Freq: Once | INTRAVENOUS | Status: AC
  Administered 2023-09-06: 1 mL

## 2023-09-06 MED ORDER — IOHEXOL 180 MG/ML  SOLN
INTRAMUSCULAR | Status: AC
  Filled 2023-09-06: qty 20

## 2023-09-06 NOTE — Progress Notes (Signed)
 PROVIDER NOTE: Interpretation of information contained herein should be left to medically-trained personnel. Specific patient instructions are provided elsewhere under Patient Instructions section of medical record. This document was created in part using STT-dictation technology, any transcriptional errors that may result from this process are unintentional.  Patient: Tony Cardenas Type: Established DOB: 09-13-1970 MRN: [id number] PCP: Vicci Duwaine SQUIBB, DO  Service: Procedure DOS: 09/06/2023 Setting: Ambulatory Location: Ambulatory outpatient facility Delivery: Face-to-face Provider: Wallie Sherry, MD Specialty: Interventional Pain Management Specialty designation: 09 Location: Outpatient facility Ref. Prov.: Vicci Duwaine P, DO       Interventional Therapy   Procedure: Cervical Epidural Steroid injection (CESI) (Interlaminar) #2  Laterality: Midline  Level: C7-T1 Imaging: Fluoroscopy-assisted DOS: 09/06/2023  Performed by: Wallie Sherry, MD Anesthesia: Local anesthesia (1-2% Lidocaine ) Sedation:                           Purpose: Diagnostic/Therapeutic Indications: Cervicalgia, cervical radicular pain, degenerative disc disease, severe enough to impact quality of life or function. 1. Cervical radicular pain   2. Cervicalgia   3. Chronic pain syndrome    NAS-11 score:   Pre-procedure: 8 /10   Post-procedure: 8 /10      Position  Prep  Materials:  Location setting: Procedure suite Position: Prone, on modified reverse trendelenburg to facilitate breathing, with head in head-cradle. Pillows positioned under chest (below chin-level) with cervical spine flexed. Safety Precautions: Patient was assessed for positional comfort and pressure points before starting the procedure. Prepping solution: DuraPrep (Iodine Povacrylex [0.7% available iodine] and Isopropyl Alcohol, 74% w/w) Prep Area: Entire  cervicothoracic region Approach: percutaneous, paramedial Intended target:  Posterior cervical epidural space Materials Procedure:  Tray: Epidural Needle(s): Epidural (Tuohy) Qty: 1 Length: (90mm) 3.5-inch Gauge: 22G   H&P (Pre-op Assessment):  Tony Cardenas is a 53 y.o. (year old), male patient, seen today for interventional treatment. He  has a past surgical history that includes Neck surgery (1989); Knee surgery (Right); Colonoscopy (N/A, 06/02/2014); Polypectomy (06/02/2014); Appendectomy; Esophagogastroduodenoscopy (egd) with propofol  (N/A, 07/29/2016); Back surgery; Anterior cervical corpectomy (N/A, 10/10/2018); Hernia repair (over 10 years ago); Anterior cervical corpectomy (N/A, 05/01/2019); Posterior cervical fusion/foraminotomy (N/A, 05/01/2019); Colonoscopy with propofol  (N/A, 11/15/2021); Spine surgery; and Cholecystectomy. Tony Cardenas has a current medication list which includes the following prescription(s): albuterol , blood glucose meter kit and supplies, freestyle libre 3 plus sensor, ezetimibe , fenofibrate , fluticasone , folic acid , glipizide , freestyle lite, hydrocodone -acetaminophen , [START ON 09/11/2023] hydrocodone -acetaminophen , [START ON 10/11/2023] hydrocodone -acetaminophen , hydroxychloroquine , tresiba  flextouch, insulin  pen needle, freestyle, lisinopril , metformin , methocarbamol , methotrexate , omega-3 acid ethyl esters, and rosuvastatin , and the following Facility-Administered Medications: dexamethasone , lactated ringers , lidocaine , ropivacaine  (pf) 2 mg/ml (0.2%), and sodium chloride  flush. His primarily concern today is the Neck Pain  Initial Vital Signs:  Pulse/HCG Rate: 75  Temp: (!) 97.2 F (36.2 C) Resp: 18 BP: 133/73 SpO2: 98 %  BMI: Estimated body mass index is 33.47 kg/m as calculated from the following:   Height as of this encounter: 5' 11 (1.803 m).   Weight as of this encounter: 240 lb (108.9 kg).  Risk Assessment: Allergies: Reviewed. He is allergic to atorvastatin , hydromorphone  hcl, and semaglutide .  Allergy Precautions: None  required Coagulopathies: Reviewed. None identified.  Blood-thinner therapy: None at this time Active Infection(s): Reviewed. None identified. Tony Cardenas is afebrile  Site Confirmation: Tony Cardenas was asked to confirm the procedure and laterality before marking the site Procedure checklist: Completed Consent: Before the procedure and under the influence of no sedative(s), amnesic(s),  or anxiolytics, the patient was informed of the treatment options, risks and possible complications. To fulfill our ethical and legal obligations, as recommended by the American Medical Association's Code of Ethics, I have informed the patient of my clinical impression; the nature and purpose of the treatment or procedure; the risks, benefits, and possible complications of the intervention; the alternatives, including doing nothing; the risk(s) and benefit(s) of the alternative treatment(s) or procedure(s); and the risk(s) and benefit(s) of doing nothing. The patient was provided information about the general risks and possible complications associated with the procedure. These may include, but are not limited to: failure to achieve desired goals, infection, bleeding, organ or nerve damage, allergic reactions, paralysis, and death. In addition, the patient was informed of those risks and complications associated to Spine-related procedures, such as failure to decrease pain; infection (i.e.: Meningitis, epidural or intraspinal abscess); bleeding (i.e.: epidural hematoma, subarachnoid hemorrhage, or any other type of intraspinal or peri-dural bleeding); organ or nerve damage (i.e.: Any type of peripheral nerve, nerve root, or spinal cord injury) with subsequent damage to sensory, motor, and/or autonomic systems, resulting in permanent pain, numbness, and/or weakness of one or several areas of the body; allergic reactions; (i.e.: anaphylactic reaction); and/or death. Furthermore, the patient was informed of those risks and  complications associated with the medications. These include, but are not limited to: allergic reactions (i.e.: anaphylactic or anaphylactoid reaction(s)); adrenal axis suppression; blood sugar elevation that in diabetics may result in ketoacidosis or comma; water  retention that in patients with history of congestive heart failure may result in shortness of breath, pulmonary edema, and decompensation with resultant heart failure; weight gain; swelling or edema; medication-induced neural toxicity; particulate matter embolism and blood vessel occlusion with resultant organ, and/or nervous system infarction; and/or aseptic necrosis of one or more joints. Finally, the patient was informed that Medicine is not an exact science; therefore, there is also the possibility of unforeseen or unpredictable risks and/or possible complications that may result in a catastrophic outcome. The patient indicated having understood very clearly. We have given the patient no guarantees and we have made no promises. Enough time was given to the patient to ask questions, all of which were answered to the patient's satisfaction. Mr. Schuenemann has indicated that he wanted to continue with the procedure. Attestation: I, the ordering provider, attest that I have discussed with the patient the benefits, risks, side-effects, alternatives, likelihood of achieving goals, and potential problems during recovery for the procedure that I have provided informed consent. Date  Time: 09/06/2023  1:23 PM   Pre-Procedure Preparation:  Monitoring: As per clinic protocol. Respiration, ETCO2, SpO2, BP, heart rate and rhythm monitor placed and checked for adequate function Safety Precautions: Patient was assessed for positional comfort and pressure points before starting the procedure. Time-out: I initiated and conducted the Time-out before starting the procedure, as per protocol. The patient was asked to participate by confirming the accuracy of the  Time Out information. Verification of the correct person, site, and procedure were performed and confirmed by me, the nursing staff, and the patient. Time-out conducted as per Joint Commission's Universal Protocol (UP.01.01.01). Time: 1401 Start Time: 1401 hrs.  Description  Narrative of Procedure:          Rationale (medical necessity): procedure needed and proper for the diagnosis and/or treatment of the patient's medical symptoms and needs. Start Time: 1401 hrs. Safety Precautions: Aspiration looking for blood return was conducted prior to all injections. At no point did we inject any  substances, as a needle was being advanced. No attempts were made at seeking any paresthesias. Safe injection practices and needle disposal techniques used. Medications properly checked for expiration dates. SDV (single dose vial) medications used. Description of procedure: Protocol guidelines were followed. The patient was assisted into a comfortable position. The target area was identified and the area prepped in the usual manner. Skin & deeper tissues infiltrated with local anesthetic. Appropriate amount of time allowed to pass for local anesthetics to take effect. Using fluoroscopic guidance, the epidural needle was introduced through the skin, ipsilateral to the reported pain, and advanced to the target area. Posterior laminar os was contacted and the needle walked caudad, until the lamina was cleared. The ligamentum flavum was engaged and the epidural space identified using "loss-of-resistance technique" with 2-3 ml of PF-NaCl (0.9% NSS), in a 5cc dedicated LOR syringe. (See Imaging guidance below for use of contrast details.) Once proper needle placement was secured, and negative aspiration confirmed, the solution was injected in intermittent fashion, asking for systemic symptoms every 0.5cc. The needles were then removed and the area cleansed, making sure to leave some of the prepping solution back to take  advantage of its long term bactericidal properties.  Vitals:   09/06/23 1332 09/06/23 1402 09/06/23 1406  BP: 133/73 122/69 120/62  Pulse: 75 79 80  Resp: 18 16 16   Temp: (!) 97.2 F (36.2 C)    SpO2: 98% 99% 99%  Weight: 240 lb (108.9 kg)    Height: 5' 11 (1.803 m)       End Time:   hrs.  Imaging Guidance (Spinal):          Type of Imaging Technique: Fluoroscopy Guidance (Spinal) Indication(s): Fluoroscopy guidance for needle placement to enhance accuracy in procedures requiring precise needle localization for targeted delivery of medication in or near specific anatomical locations not easily accessible without such real-time imaging assistance. Exposure Time: Please see nurses notes. Contrast: Before injecting any contrast, we confirmed that the patient did not have an allergy to iodine, shellfish, or radiological contrast. Once satisfactory needle placement was completed at the desired level, radiological contrast was injected. Contrast injected under live fluoroscopy. No contrast complications. See chart for type and volume of contrast used. Fluoroscopic Guidance: I was personally present during the use of fluoroscopy. Tunnel Vision Technique used to obtain the best possible view of the target area. Parallax error corrected before commencing the procedure. Direction-depth-direction technique used to introduce the needle under continuous pulsed fluoroscopy. Once target was reached, antero-posterior, oblique, and lateral fluoroscopic projection used confirm needle placement in all planes. Images permanently stored in EMR. Interpretation: I personally interpreted the imaging intraoperatively. Adequate needle placement confirmed in multiple planes. Appropriate spread of contrast into desired area was observed. No evidence of afferent or efferent intravascular uptake. No intrathecal or subarachnoid spread observed. Permanent images saved into the patient's record.  Post-operative  Assessment:  Post-procedure Vital Signs:  Pulse/HCG Rate: 80  Temp: (!) 97.2 F (36.2 C) Resp: 16 BP: 120/62 SpO2: 99 %  EBL: None  Complications: No immediate post-treatment complications observed by team, or reported by patient.  Note: The patient tolerated the entire procedure well. A repeat set of vitals were taken after the procedure and the patient was kept under observation following institutional policy, for this type of procedure. Post-procedural neurological assessment was performed, showing return to baseline, prior to discharge. The patient was provided with post-procedure discharge instructions, including a section on how to identify potential problems. Should any  problems arise concerning this procedure, the patient was given instructions to immediately contact us , at any time, without hesitation. In any case, we plan to contact the patient by telephone for a follow-up status report regarding this interventional procedure.  Comments:  No additional relevant information. 5 out of 5 strength bilateral upper extremity: Shoulder abduction, elbow flexion, elbow extension, thumb extension.   Plan of Care (POC)  Orders:  Orders Placed This Encounter  Procedures   DG PAIN [facility] C-ARM 1-60 MIN NO REPORT    Intraoperative interpretation by procedural physician at [facility].    Standing Status:   Standing    Number of Occurrences:   1    Reason for exam::   Assistance in needle guidance and placement for procedures requiring needle placement in or near specific anatomical locations not easily accessible without such assistance.   Chronic Opioid Analgesic:  Hydrocodone  7.5 mg QID PRN   Medications ordered for procedure: Meds ordered this encounter  Medications   lidocaine  (XYLOCAINE ) 2 % (with pres) injection 400 mg   lactated ringers  infusion   sodium chloride  flush (NS) 0.9 % injection 1 mL   ropivacaine  (PF) 2 mg/mL (0.2%) (NAROPIN ) injection 1 mL    dexamethasone  (DECADRON ) injection 10 mg   Medications administered: Reyes RAMAN. Lacinda Mott had no medications administered during this visit.  See the medical record for exact dosing, route, and time of administration.  Follow-up plan:   No follow-ups on file.      Recent Visits Date Type Provider Dept  08/10/23 Office Visit Patel, Seema K, NP [facility]  Showing recent visits within past 90 days and meeting all other requirements Today's Visits Date Type Provider Dept  09/06/23 Procedure visit Marcelino Nurse, MD [facility]  Showing today's visits and meeting all other requirements Future Appointments Date Type Provider Dept  11/09/23 Appointment Patel, Seema K, NP [facility]  Showing future appointments within next 90 days and meeting all other requirements  Disposition: Discharge home  Discharge (Date  Time): 09/06/2023;   hrs.   Primary Care Physician: Vicci Duwaine SQUIBB, DO Location: [facility] Note by: Nurse Marcelino, MD (TTS technology used. I apologize for any typographical errors that were not detected and corrected.) Date: 09/06/2023; Time: 2:18 PM  Disclaimer:  Medicine is not an Visual merchandiser. The only guarantee in medicine is that nothing is guaranteed. It is important to note that the decision to proceed with this intervention was based on the information collected from the patient. The Data and conclusions were drawn from the patient's questionnaire, the interview, and the physical examination. Because the information was provided in large part by the patient, it cannot be guaranteed that it has not been purposely or unconsciously manipulated. Every effort has been made to obtain as much relevant data as possible for this evaluation. It is important to note that the conclusions that lead to this procedure are derived in large part from the available data. Always take into account that the treatment will also  be dependent on availability of resources and existing treatment guidelines, considered by other Pain Management Practitioners as being common knowledge and practice, at the time of the intervention. For Medico-Legal purposes, it is also important to point out that variation in procedural techniques and pharmacological choices are the acceptable norm. The indications, contraindications, technique, and results of the above procedure should only be interpreted and judged by a Board-Certified Interventional Pain Specialist with extensive familiarity and  expertise in the same exact procedure and technique.

## 2023-09-06 NOTE — Patient Instructions (Signed)

## 2023-09-06 NOTE — Progress Notes (Signed)
 Safety precautions to be maintained throughout the outpatient stay will include: orient to surroundings, keep bed in low position, maintain call bell within reach at all times, provide assistance with transfer out of bed and ambulation.

## 2023-09-07 ENCOUNTER — Telehealth: Payer: Self-pay

## 2023-09-07 NOTE — Telephone Encounter (Signed)
Post procedure follow up.  Patient states he is doing well 

## 2023-09-11 ENCOUNTER — Other Ambulatory Visit: Payer: Self-pay

## 2023-09-12 ENCOUNTER — Other Ambulatory Visit: Payer: Self-pay

## 2023-09-27 ENCOUNTER — Encounter: Payer: Self-pay | Admitting: Family Medicine

## 2023-09-27 ENCOUNTER — Ambulatory Visit: Admitting: Family Medicine

## 2023-09-27 ENCOUNTER — Other Ambulatory Visit: Payer: Self-pay

## 2023-09-27 VITALS — BP 126/88 | HR 83 | Ht 71.0 in | Wt 237.6 lb

## 2023-09-27 DIAGNOSIS — E782 Mixed hyperlipidemia: Secondary | ICD-10-CM

## 2023-09-27 DIAGNOSIS — E1165 Type 2 diabetes mellitus with hyperglycemia: Secondary | ICD-10-CM

## 2023-09-27 DIAGNOSIS — Z0000 Encounter for general adult medical examination without abnormal findings: Secondary | ICD-10-CM

## 2023-09-27 DIAGNOSIS — F17210 Nicotine dependence, cigarettes, uncomplicated: Secondary | ICD-10-CM | POA: Diagnosis not present

## 2023-09-27 DIAGNOSIS — Z7985 Long-term (current) use of injectable non-insulin antidiabetic drugs: Secondary | ICD-10-CM

## 2023-09-27 DIAGNOSIS — E559 Vitamin D deficiency, unspecified: Secondary | ICD-10-CM | POA: Diagnosis not present

## 2023-09-27 DIAGNOSIS — I10 Essential (primary) hypertension: Secondary | ICD-10-CM | POA: Diagnosis not present

## 2023-09-27 DIAGNOSIS — R3911 Hesitancy of micturition: Secondary | ICD-10-CM

## 2023-09-27 DIAGNOSIS — Z23 Encounter for immunization: Secondary | ICD-10-CM | POA: Diagnosis not present

## 2023-09-27 DIAGNOSIS — R7989 Other specified abnormal findings of blood chemistry: Secondary | ICD-10-CM | POA: Diagnosis not present

## 2023-09-27 LAB — MICROALBUMIN, URINE WAIVED
Creatinine, Urine Waived: 100 mg/dL (ref 10–300)
Microalb, Ur Waived: 10 mg/L (ref 0–19)
Microalb/Creat Ratio: <30 mg/g (ref <30)

## 2023-09-27 LAB — BAYER DCA HB A1C WAIVED: HB A1C (BAYER DCA - WAIVED): 6.9 % — ABNORMAL HIGH (ref 4.8–5.6)

## 2023-09-27 MED ORDER — EZETIMIBE 10 MG PO TABS
10.0000 mg | ORAL_TABLET | Freq: Every day | ORAL | 1 refills | Status: AC
  Filled 2023-09-27 – 2023-11-18 (×2): qty 90, 90d supply, fill #0

## 2023-09-27 NOTE — Assessment & Plan Note (Signed)
 Rechecking labs today. Await results. Treat as needed.
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
## 2023-09-27 NOTE — Assessment & Plan Note (Signed)
 Referral for low dose lung cancer screening placed today.

## 2023-09-27 NOTE — Assessment & Plan Note (Signed)
 Doing great with A1c of 6.9. Continue current regimen. Continue to follow with endocrinology. Call with any concerns.

## 2023-09-27 NOTE — Assessment & Plan Note (Signed)
 Under good control on current regimen. Continue current regimen. Continue to monitor. Call with any concerns. Refills given. Labs drawn today.

## 2023-09-28 LAB — CBC WITH DIFFERENTIAL/PLATELET
Basophils Absolute: 0.1 x10E3/uL (ref 0.0–0.2)
Basos: 1 %
EOS (ABSOLUTE): 0.2 x10E3/uL (ref 0.0–0.4)
Eos: 2 %
Hematocrit: 40.5 % (ref 37.5–51.0)
Hemoglobin: 13.6 g/dL (ref 13.0–17.7)
Immature Grans (Abs): 0.1 x10E3/uL (ref 0.0–0.1)
Immature Granulocytes: 1 %
Lymphocytes Absolute: 2.5 x10E3/uL (ref 0.7–3.1)
Lymphs: 27 %
MCH: 31.6 pg (ref 26.6–33.0)
MCHC: 33.6 g/dL (ref 31.5–35.7)
MCV: 94 fL (ref 79–97)
Monocytes Absolute: 0.4 x10E3/uL (ref 0.1–0.9)
Monocytes: 5 %
Neutrophils Absolute: 5.9 x10E3/uL (ref 1.4–7.0)
Neutrophils: 64 %
Platelets: 439 x10E3/uL (ref 150–450)
RBC: 4.3 x10E6/uL (ref 4.14–5.80)
RDW: 13.2 % (ref 11.6–15.4)
WBC: 9.3 x10E3/uL (ref 3.4–10.8)

## 2023-09-28 LAB — COMPREHENSIVE METABOLIC PANEL WITH GFR
ALT: 14 IU/L (ref 0–44)
AST: 11 IU/L (ref 0–40)
Albumin: 4.6 g/dL (ref 3.8–4.9)
Alkaline Phosphatase: 54 IU/L (ref 47–123)
BUN/Creatinine Ratio: 14 (ref 9–20)
BUN: 10 mg/dL (ref 6–24)
Bilirubin Total: 0.2 mg/dL (ref 0.0–1.2)
CO2: 21 mmol/L (ref 20–29)
Calcium: 9.1 mg/dL (ref 8.7–10.2)
Chloride: 105 mmol/L (ref 96–106)
Creatinine, Ser: 0.72 mg/dL — ABNORMAL LOW (ref 0.76–1.27)
Globulin, Total: 1.9 g/dL (ref 1.5–4.5)
Glucose: 163 mg/dL — ABNORMAL HIGH (ref 70–99)
Potassium: 4.5 mmol/L (ref 3.5–5.2)
Sodium: 139 mmol/L (ref 134–144)
Total Protein: 6.5 g/dL (ref 6.0–8.5)
eGFR: 109 mL/min/1.73 (ref >59)

## 2023-09-28 LAB — LIPID PANEL W/O CHOL/HDL RATIO
Cholesterol, Total: 145 mg/dL (ref 100–199)
HDL: 30 mg/dL — ABNORMAL LOW (ref >39)
LDL Chol Calc (NIH): 72 mg/dL (ref 0–99)
Triglycerides: 266 mg/dL — ABNORMAL HIGH (ref 0–149)
VLDL Cholesterol Cal: 43 mg/dL — ABNORMAL HIGH (ref 5–40)

## 2023-09-28 LAB — PSA: Prostate Specific Ag, Serum: 0.3 ng/mL (ref 0.0–4.0)

## 2023-09-28 LAB — TSH: TSH: 0.288 u[IU]/mL — ABNORMAL LOW (ref 0.450–4.500)

## 2023-09-28 LAB — HEPATITIS B SURFACE ANTIBODY, QUANTITATIVE: Hepatitis B Surf Ab Quant: <3.5 m[IU]/mL — ABNORMAL LOW

## 2023-09-28 LAB — VITAMIN D 25 HYDROXY (VIT D DEFICIENCY, FRACTURES): Vit D, 25-Hydroxy: 39.9 ng/mL (ref 30.0–100.0)

## 2023-10-02 ENCOUNTER — Ambulatory Visit: Attending: Internal Medicine | Admitting: Internal Medicine

## 2023-10-02 ENCOUNTER — Encounter: Payer: Self-pay | Admitting: Internal Medicine

## 2023-10-02 ENCOUNTER — Other Ambulatory Visit: Payer: Self-pay

## 2023-10-02 VITALS — BP 117/68 | HR 77 | Temp 98.0°F | Resp 16 | Ht 71.0 in | Wt 241.2 lb

## 2023-10-02 DIAGNOSIS — Z79899 Other long term (current) drug therapy: Secondary | ICD-10-CM | POA: Diagnosis not present

## 2023-10-02 DIAGNOSIS — M0609 Rheumatoid arthritis without rheumatoid factor, multiple sites: Secondary | ICD-10-CM | POA: Diagnosis not present

## 2023-10-02 DIAGNOSIS — M542 Cervicalgia: Secondary | ICD-10-CM

## 2023-10-02 DIAGNOSIS — E1165 Type 2 diabetes mellitus with hyperglycemia: Secondary | ICD-10-CM

## 2023-10-02 MED ORDER — METHOTREXATE SODIUM 2.5 MG PO TABS
20.0000 mg | ORAL_TABLET | ORAL | 0 refills | Status: DC
  Filled 2023-10-02: qty 96, 84d supply, fill #0

## 2023-10-02 MED ORDER — HYDROXYCHLOROQUINE SULFATE 200 MG PO TABS
400.0000 mg | ORAL_TABLET | Freq: Every day | ORAL | 0 refills | Status: DC
  Filled 2023-10-02: qty 180, 90d supply, fill #0
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
## 2023-10-05 ENCOUNTER — Ambulatory Visit: Payer: Self-pay | Admitting: Family Medicine

## 2023-10-05 ENCOUNTER — Ambulatory Visit (INDEPENDENT_AMBULATORY_CARE_PROVIDER_SITE_OTHER): Admitting: Pediatrics

## 2023-10-05 ENCOUNTER — Telehealth: Payer: Self-pay

## 2023-10-05 ENCOUNTER — Ambulatory Visit: Payer: Self-pay | Admitting: *Deleted

## 2023-10-05 ENCOUNTER — Other Ambulatory Visit: Payer: Self-pay

## 2023-10-05 VITALS — BP 123/77 | HR 92 | Temp 98.2°F | Ht 71.0 in | Wt 237.6 lb

## 2023-10-05 DIAGNOSIS — J069 Acute upper respiratory infection, unspecified: Secondary | ICD-10-CM | POA: Diagnosis not present

## 2023-10-05 DIAGNOSIS — Z794 Long term (current) use of insulin: Secondary | ICD-10-CM

## 2023-10-05 DIAGNOSIS — Z87891 Personal history of nicotine dependence: Secondary | ICD-10-CM

## 2023-10-05 DIAGNOSIS — E1165 Type 2 diabetes mellitus with hyperglycemia: Secondary | ICD-10-CM | POA: Diagnosis not present

## 2023-10-05 DIAGNOSIS — Z122 Encounter for screening for malignant neoplasm of respiratory organs: Secondary | ICD-10-CM

## 2023-10-05 DIAGNOSIS — J441 Chronic obstructive pulmonary disease with (acute) exacerbation: Secondary | ICD-10-CM

## 2023-10-05 MED ORDER — DOXYCYCLINE HYCLATE 100 MG PO TABS
100.0000 mg | ORAL_TABLET | Freq: Two times a day (BID) | ORAL | 0 refills | Status: AC
  Filled 2023-10-05: qty 10, 5d supply, fill #0

## 2023-10-05 MED ORDER — PREDNISONE 20 MG PO TABS
40.0000 mg | ORAL_TABLET | Freq: Every day | ORAL | 0 refills | Status: AC
  Filled 2023-10-05: qty 10, 5d supply, fill #0

## 2023-10-05 NOTE — Telephone Encounter (Signed)
 Lung Cancer Screening Narrative/Criteria Questionnaire (Cigarette Smokers Only- No Cigars/Pipes/vapes)   Tony Cardenas   SDMV:10/23/2023 at 2:30 pm Tony Cardenas       August 24, 1970               LDCT: 10/23/2023 at 4:00 pm [facility]    52 y.o.   Phone: [phone]  Lung Screening Narrative (confirm age 37-77 yrs Medicare / 50-80 yrs Private pay insurance)   Insurance information: [employer]   Referring Provider: Vicci, DO   This screening involves an initial phone call with a team member from our program. It is called a shared decision making visit. The initial meeting is required by  insurance and Medicare to make sure you understand the program. This appointment takes about 15-20 minutes to complete. You will complete the screening scan at your scheduled date/time.  This scan takes about 5-10 minutes to complete. You can eat and drink normally before and after the scan.  Criteria questions for Lung Cancer Screening:   Are you a current or former smoker? Former Age began smoking: 13   If you are a former smoker, what year did you quit smoking?Quit 2018 (within 15 yrs)   To calculate your smoking history, I need an accurate estimate of how many packs of cigarettes you smoked per day and for how many years. (Not just the number of PPD you are now smoking)   Years smoking 32 x Packs per day 3 = Pack years 96   (at least 20 pack yrs)   (Make sure they understand that we need to know how much they have smoked in the past, not just the number of PPD they are smoking now)  Do you have a personal history of cancer?  No    Do you have a family history of cancer? Yes  (cancer type and and relative) Mother with colon cancer.   Are you coughing up blood?  No  Have you had unexplained weight loss of 15 lbs or more in the last 6 months? No  It looks like you meet all criteria.  When would be a good time for us  to schedule you for this screening?   Additional information: N/A

## 2023-10-05 NOTE — Patient Instructions (Signed)
 5 days for prednisone  and antibiotics

## 2023-10-05 NOTE — Progress Notes (Signed)
 Office Visit  BP 123/77 (BP Location: Left Arm, Patient Position: Sitting, Cuff Size: Large)   Pulse 92   Temp 98.2 F (36.8 C) (Oral)   Ht 5' 11 (1.803 m)   Wt 237 lb 9.6 oz (107.8 kg)   SpO2 97%   BMI 33.14 kg/m    Subjective:    Patient ID: Tony Cardenas, male    DOB: [birth year], 53 y.o.   MRN: [id number]  HPI: Tony Cardenas is a 54 y.o. male  Chief Complaint  Patient presents with   Sore Throat    Swollen lymphs, started Wednesday 9/17   Nasal Congestion    Discussed the use of AI scribe software for clinical note transcription with the patient, who gave verbal consent to proceed.  History of Present Illness   Tony Cardenas is a 53 year old male with COPD who presents with worsening cough and shortness of breath.  He has been experiencing worsening cough and shortness of breath for about a week. The cough occurs primarily in the evening and is accompanied by a sensation of chest tightness and pressure. His symptoms are not improving and are getting worse.  He has a history of COPD and uses a butyl inhaler, which he finds helpful in relieving his symptoms. He recalls previous episodes where he required antibiotics and steroids for exacerbations, typically after initial treatments were ineffective. He reports feeling like his symptoms are moving into his chest, similar to past exacerbations.  He has a history of smoking but has quit, which has significantly reduced the frequency of his respiratory illnesses. Since quitting smoking, he experiences respiratory issues only once every four to five years, compared to two or three times a year previously.  He is managing diabetes, with recent improvements in his A1c levels to 6.9, attributed to dietary changes such as reducing sugar intake from sodas and sweet tea. He uses a Libre 3 device to monitor his blood sugar levels, which have improved significantly from previous levels around 250 mg/dL to current  levels around 86 mg/dL.  He is on pain management for rheumatoid arthritis, receiving hydrocodone  monthly. He has experienced nerve pain and swelling in his hands, which he attributes to high blood sugar levels exacerbating his arthritis. Dietary changes have helped reduce his pain levels.        Relevant past medical, surgical, family and social history reviewed and updated as indicated. Interim medical history since our last visit reviewed. Allergies and medications reviewed and updated.  ROS per HPI unless specifically indicated above     Objective:    BP 123/77 (BP Location: Left Arm, Patient Position: Sitting, Cuff Size: Large)   Pulse 92   Temp 98.2 F (36.8 C) (Oral)   Ht 5' 11 (1.803 m)   Wt 237 lb 9.6 oz (107.8 kg)   SpO2 97%   BMI 33.14 kg/m   Wt Readings from Last 3 Encounters:  10/05/23 237 lb 9.6 oz (107.8 kg)  10/02/23 241 lb 3.2 oz (109.4 kg)  09/27/23 237 lb 9.6 oz (107.8 kg)     Physical Exam Constitutional:      General: He is not in acute distress. HENT:     Mouth/Throat:     Mouth: No oral lesions.     Pharynx: Posterior oropharyngeal erythema present. No oropharyngeal exudate.     Tonsils: No tonsillar exudate.  Cardiovascular:     Rate and Rhythm: Normal rate and regular rhythm.     Heart  sounds: Normal heart sounds.  Pulmonary:     Effort: Pulmonary effort is normal.     Breath sounds: Normal breath sounds. No wheezing or rales.  Neurological:     Mental Status: He is alert.         09/27/2023    8:44 AM 09/06/2023    1:35 PM 08/10/2023    7:52 AM 06/20/2023    8:48 AM 03/22/2023    8:34 AM  Depression screen PHQ 2/9  Decreased Interest 1 0 0 0 2  Down, Depressed, Hopeless 1 0 0 0 2  PHQ - 2 Score 2 0 0 0 4  Altered sleeping 1   0 2  Tired, decreased energy 3   2 2   Change in appetite 1   0 2  Feeling bad or failure about yourself  1   0 2  Trouble concentrating 1   0 2  Moving slowly or fidgety/restless 1   0 2  Suicidal thoughts 0    0 2  PHQ-9 Score 10   2 18   Difficult doing work/chores    Not difficult at all        09/27/2023    8:44 AM 03/22/2023    8:33 AM 09/22/2022    8:15 AM 06/22/2022    8:17 AM  GAD 7 : Generalized Anxiety Score  Nervous, Anxious, on Edge 1 2 1 1   Control/stop worrying 1 2 3 1   Worry too much - different things 1 2 3 1   Trouble relaxing 1 2 3 1   Restless 1 2 2 1   Easily annoyed or irritable 1 2 2 1   Afraid - awful might happen 1 2 2  0  Total GAD 7 Score 7 14 16 6   Anxiety Difficulty   Very difficult Somewhat difficult       Assessment & Plan:  Assessment & Plan   COPD exacerbation (HCC) Suspect acute exacerbation with worsening cough, sputum, and dyspnea or almost 2 weeks, worsening. No wheezing or pneumonia.  - Prescribe doxycycline  for 5 days. - Prescribe prednisone  for 5 days. - Instruct to report if symptoms do not improve. -     predniSONE ; Take 2 tablets (40 mg total) by mouth daily with breakfast for 5 days.  Dispense: 10 tablet; Refill: 0 -     Doxycycline  Hyclate; Take 1 tablet (100 mg total) by mouth 2 (two) times daily for 5 days.  Dispense: 10 tablet; Refill: 0  Upper respiratory tract infection, unspecified type Neg swabs below. -     POC Covid19/Flu A&B Antigen -     Rapid Strep Screen ([facility] ONLY) -     Culture, Group A Strep  Type 2 diabetes mellitus Well-controlled with A1c of 6.9. Lifestyle modifications have improved glucose control. Potential hyperglycemia due to prednisone . - Continue dietary modifications and glucose monitoring. - Monitor blood glucose closely during prednisone  treatment.    Follow up plan: Return if symptoms worsen or fail to improve.  Hadassah SHAUNNA Nett, MD

## 2023-10-05 NOTE — Telephone Encounter (Signed)
 Copied from CRM [id number]. Topic: Clinical - Red Word Triage >> Oct 05, 2023 12:44 PM Fonda T wrote: [facility] that prompted transfer to Nurse Triage: Patient states he is having worsening cough, chest and nasal congestion, and coughing up yellow mucous, ann shortness of breath. Reason for Disposition  [1] Sinus congestion (pressure, fullness) AND [2] present > 10 days  Answer Assessment - Initial Assessment Questions 1. LOCATION: Where does it hurt?      Sinus congestion and coughing.    2. ONSET: When did the sinus pain start?  (e.g., hours, days)      Started as a cold last week in my head.    now it's in my chest.   I'm shortness of breath and coughing a lot.      3. SEVERITY: How bad is the pain?   (Scale 0-10; or none, mild, moderate or severe)     It's getting worse.   Sudafed not helping  and I can't sleep now. I tried Mucinex but it doesn't help very much 4. RECURRENT SYMPTOM: Have you ever had sinus problems before? If Yes, ask: When was the last time? and What happened that time?      Not asked 5. NASAL CONGESTION: Is the nose blocked? If Yes, ask: Can you open it or must you breathe through your mouth?     It's green mucus 6. NASAL DISCHARGE: Do you have discharge from your nose? If so ask, What color?     I'm blowing my nose and coughing u green mucus 7. FEVER: Do you have a fever? If Yes, ask: What is it, how was it measured, and when did it start?      Not now 8. OTHER SYMPTOMS: Do you have any other symptoms? (e.g., sore throat, cough, earache, difficulty breathing)     I have a sore throat and shortness of breath and my ears are hurting too. 9. PREGNANCY: Is there any chance you are pregnant? When was your last menstrual period?     N/A  Protocols used: Sinus Pain or Congestion-A-AH FYI Only or Action Required?: FYI only for provider.  Patient was last seen in primary care on 09/27/2023 by Vicci Duwaine SQUIBB, DO.  Called Nurse Triage  reporting Sinusitis. Nasal congestion and coughing a lot  Symptoms began a week ago.  Interventions attempted: OTC medications: Mucinex.  Symptoms are: gradually worsening.  Triage Disposition: See PCP When Office is Open (Within 3 Days)Appt for today with Dr. Herold for 2:00 made  Patient/caregiver understands and will follow disposition?: Yes

## 2023-10-06 ENCOUNTER — Ambulatory Visit: Payer: Self-pay | Admitting: Pediatrics

## 2023-10-06 ENCOUNTER — Encounter: Payer: Self-pay | Admitting: Family Medicine

## 2023-10-08 LAB — RAPID STREP SCREEN (MED CTR MEBANE ONLY): Strep Gp A Ag, IA W/Reflex: NEGATIVE

## 2023-10-08 LAB — CULTURE, GROUP A STREP

## 2023-10-10 ENCOUNTER — Encounter: Payer: Self-pay | Admitting: Pediatrics

## 2023-10-10 LAB — POC COVID19/FLU A&B COMBO
Covid Antigen, POC: NEGATIVE
Influenza A Antigen, POC: NEGATIVE
Influenza B Antigen, POC: NEGATIVE

## 2023-10-12 ENCOUNTER — Other Ambulatory Visit: Payer: Self-pay

## 2023-10-17 ENCOUNTER — Other Ambulatory Visit: Payer: Self-pay

## 2023-10-23 ENCOUNTER — Ambulatory Visit
Admission: RE | Admit: 2023-10-23 | Discharge: 2023-10-23 | Disposition: A | Discharge status: Home / Self Care | Source: Ambulatory Visit | Attending: Acute Care | Admitting: Acute Care

## 2023-10-23 ENCOUNTER — Encounter: Payer: Self-pay | Admitting: *Deleted

## 2023-10-23 ENCOUNTER — Ambulatory Visit: Admitting: *Deleted

## 2023-10-23 DIAGNOSIS — Z87891 Personal history of nicotine dependence: Secondary | ICD-10-CM | POA: Diagnosis not present

## 2023-10-23 DIAGNOSIS — Z122 Encounter for screening for malignant neoplasm of respiratory organs: Secondary | ICD-10-CM | POA: Diagnosis not present

## 2023-10-23 NOTE — Patient Instructions (Signed)

## 2023-10-23 NOTE — Progress Notes (Signed)
  Virtual Visit via Telephone Note  I connected with Tony Cardenas on 10/23/23 at  2:30 PM EDT by telephone and verified that I am speaking with the correct person using two identifiers.  Location: Patient: in home Provider: [address] [place], [place], [address]    Shared Decision Making Visit Lung Cancer Screening Program [phone])   Eligibility: Age 53 y.o. Pack Years Smoking History Calculation 96 (# packs/per year x # years smoked) Recent History of coughing up blood  no Unexplained weight loss? no ( >Than 15 pounds within the last 6 months ) Prior History Lung / other cancer no (Diagnosis within the last 5 years already requiring surveillance chest CT Scans). Smoking Status Former Smoker Former Smokers: Years since quit: 4 years  Quit Date: 03-13-19  Visit Components: Discussion included one or more decision making aids. yes Discussion included risk/benefits of screening. yes Discussion included potential follow up diagnostic testing for abnormal scans. yes Discussion included meaning and risk of over diagnosis. yes Discussion included meaning and risk of False Positives. yes Discussion included meaning of total radiation exposure. yes  Counseling Included: Importance of adherence to annual lung cancer LDCT screening. yes Impact of comorbidities on ability to participate in the program. yes Ability and willingness to under diagnostic treatment. yes  Smoking Cessation Counseling: Current Smokers:  Discussed importance of smoking cessation. yes Information about tobacco cessation classes and interventions provided to patient. yes Patient provided with ticket for LDCT Scan. yes Symptomatic Patient. no  Counseling NA Diagnosis Code: Tobacco Use Z72.0 Asymptomatic Patient yes  Counseling (Intermediate counseling: > three minutes counseling) [id number]  Counseled patient 4 minutes regarding tobacco use.   Former Smokers:  Discussed the importance of  maintaining cigarette abstinence. yes Diagnosis Code: Personal History of Nicotine Dependence. S12.108 Information about tobacco cessation classes and interventions provided to patient. Yes Patient provided with ticket for LDCT Scan. yes Written Order for Lung Cancer Screening with LDCT placed in Epic. Yes (CT Chest Lung Cancer Screening Low Dose W/O CM) [id number] Z12.2-Screening of respiratory organs Z87.891-Personal history of nicotine dependence   Josette Ranger, RN 10/23/23

## 2023-10-25 ENCOUNTER — Ambulatory Visit: Admitting: Nurse Practitioner

## 2023-10-25 DIAGNOSIS — E782 Mixed hyperlipidemia: Secondary | ICD-10-CM

## 2023-10-25 DIAGNOSIS — E559 Vitamin D deficiency, unspecified: Secondary | ICD-10-CM

## 2023-10-25 DIAGNOSIS — E119 Type 2 diabetes mellitus without complications: Secondary | ICD-10-CM

## 2023-10-25 DIAGNOSIS — I10 Essential (primary) hypertension: Secondary | ICD-10-CM

## 2023-10-25 DIAGNOSIS — Z7984 Long term (current) use of oral hypoglycemic drugs: Secondary | ICD-10-CM

## 2023-10-26 ENCOUNTER — Other Ambulatory Visit: Payer: Self-pay

## 2023-10-26 ENCOUNTER — Telehealth: Payer: Self-pay

## 2023-10-26 DIAGNOSIS — Z122 Encounter for screening for malignant neoplasm of respiratory organs: Secondary | ICD-10-CM

## 2023-10-26 DIAGNOSIS — R911 Solitary pulmonary nodule: Secondary | ICD-10-CM

## 2023-10-26 DIAGNOSIS — Z87891 Personal history of nicotine dependence: Secondary | ICD-10-CM

## 2023-10-26 NOTE — Telephone Encounter (Signed)
 Call report was received and noted below.

## 2023-10-26 NOTE — Telephone Encounter (Signed)
 Spoke with patient and reviewed results. He will complete a 6 month follow up to evaluate a 7.1 mm nodule, CT scheduled for 04/22/2024, order placed. Results and plan to PCP.   IMPRESSION: 1. Lung-RADS 3, probably benign findings. Short-term follow-up in 6 months is recommended with repeat low-dose chest CT without contrast (please use the following order, CT CHEST LCS NODULE FOLLOW-UP W/O CM). Subpleural right upper lobe nodule mid mean diameter 7.1 mm. 2.  Emphysema (ICD10-J43.9).

## 2023-11-09 ENCOUNTER — Ambulatory Visit: Attending: Nurse Practitioner | Admitting: Nurse Practitioner

## 2023-11-09 ENCOUNTER — Encounter: Payer: Self-pay | Admitting: Nurse Practitioner

## 2023-11-09 ENCOUNTER — Other Ambulatory Visit: Payer: Self-pay

## 2023-11-09 VITALS — BP 125/68 | HR 83 | Temp 98.2°F | Ht 71.0 in | Wt 240.0 lb

## 2023-11-09 DIAGNOSIS — Q761 Klippel-Feil syndrome: Secondary | ICD-10-CM | POA: Diagnosis present

## 2023-11-09 DIAGNOSIS — Z79899 Other long term (current) drug therapy: Secondary | ICD-10-CM | POA: Insufficient documentation

## 2023-11-09 DIAGNOSIS — G894 Chronic pain syndrome: Secondary | ICD-10-CM | POA: Insufficient documentation

## 2023-11-09 DIAGNOSIS — M5412 Radiculopathy, cervical region: Secondary | ICD-10-CM | POA: Diagnosis not present

## 2023-11-09 DIAGNOSIS — M1380 Other specified arthritis, unspecified site: Secondary | ICD-10-CM | POA: Diagnosis present

## 2023-11-09 DIAGNOSIS — M542 Cervicalgia: Secondary | ICD-10-CM | POA: Diagnosis present

## 2023-11-09 MED ORDER — HYDROCODONE-ACETAMINOPHEN 7.5-325 MG PO TABS
1.0000 | ORAL_TABLET | Freq: Four times a day (QID) | ORAL | 0 refills | Status: AC | PRN
  Filled 2023-11-11 (×2): qty 120, 30d supply, fill #0

## 2023-11-09 MED ORDER — HYDROCODONE-ACETAMINOPHEN 7.5-325 MG PO TABS
1.0000 | ORAL_TABLET | Freq: Four times a day (QID) | ORAL | 0 refills | Status: DC | PRN
  Filled 2024-01-10: qty 120, 30d supply, fill #0

## 2023-11-09 MED ORDER — HYDROCODONE-ACETAMINOPHEN 7.5-325 MG PO TABS
1.0000 | ORAL_TABLET | Freq: Four times a day (QID) | ORAL | 0 refills | Status: AC | PRN
  Filled 2023-12-11: qty 120, 30d supply, fill #0

## 2023-11-09 NOTE — Progress Notes (Signed)
 PROVIDER NOTE: Interpretation of information contained herein should be left to medically-trained personnel. Specific patient instructions are provided elsewhere under Patient Instructions section of medical record. This document was created in part using AI and STT-dictation technology, any transcriptional errors that may result from this process are unintentional.  Patient: Tony Cardenas  Service: E/M   PCP: Vicci Duwaine SQUIBB, DO  DOB: November 13, 1970  DOS: 11/09/2023  Provider: Emmy MARLA Blanch, NP  MRN: [id number]  Delivery: Face-to-face  Specialty: Interventional Pain Management  Type: Established Patient  Setting: Ambulatory outpatient facility  Specialty designation: 09  Referring Prov.: Vicci Duwaine SQUIBB, DO  Location: Outpatient office facility       History of present illness (HPI) Tony Cardenas, a 53 y.o. year old male, is here today because of his Cervical radicular pain [M54.12]. Tony Cardenas primary complain today is Neck Pain  Pertinent problems: Tony Cardenas  has S/P cervical spinal fusion; Cervical radicular pain; Cervical fusion syndrome; Cervical spondylosis with myelopathy and radiculopathy; and Chronic pain syndrome on their pertinent problem list  Pain Assessment: Severity of Chronic pain is reported as a 6 /10. Location: Neck  /radiates down both arms to hands. Onset: More than a month ago. Quality: Burning, Constant. Timing: Constant. Modifying factor(s): laying down. Vitals:  height is 5' 11 (1.803 m) and weight is 240 lb (108.9 kg). His temperature is 98.2 F (36.8 C). His blood pressure is 125/68 and his pulse is 83. His oxygen saturation is 97%.  BMI: Estimated body mass index is 33.47 kg/m as calculated from the following:   Height as of this encounter: 5' 11 (1.803 m).   Weight as of this encounter: 240 lb (108.9 kg).  Last encounter: 08/10/2023. Last procedure: 09/06/2023  Reason for encounter: both, medication management and post-procedure evaluation and  assessment. No change in medical history since last visit.  Patient's pain is at baseline.  Patient continues multimodal pain regimen as prescribed.  States that it provides pain relief and improvement in functional status.   Tony Cardenas received diagnostic/therapeutic cervical epidural steroid (interlaminar) injection on September 06, 2023.  He reports initially 100% pain relief and functional improvement during local anesthetic phase, followed sustained ongoing 50% pain relief since the procedure.  However, he continues to experience intermittent pain, but it is not consistent or persistent following the procedure.  Discussed the use of AI scribe software for clinical note transcription with the patient, who gave verbal consent to proceed.  History of Present Illness   Tony Cardenas is a 53 year old male with rheumatoid arthritis and diabetes who presents with pain management concerns.  He continues experiencing chronic neck pain, however, after cervical epidural his pain level down to 6/10.  He has experienced a significant reduction in pain levels in his hands, feet, and neck after quitting vaping two weeks ago. Previously, his pain levels were consistently at a ten, but have now decreased to a seven or eight. He attributes this improvement to discontinuing vaping, as he was unable to obtain the ingredient list for the vape he was using.  He recalls developing rheumatoid arthritis and experiencing worsening diabetes approximately six months after transitioning from smoking cigarettes to vaping. Since quitting vaping, his diabetes is now more manageable, with an average blood glucose level of 153 over the past ninety days. He has reduced his metformin  intake from four 500 mg tablets per day to two, and has stopped using insulin  injections, yet his blood sugar levels remain stable.  He has also made dietary changes, such as eliminating sweet drinks and opting for unsweetened tea, which he  finds beneficial after adjusting to the lack of sugar. These lifestyle changes have improved his quality of life, with more normal days than bad days, and he feels better overall.     Procedure Procedure: Cervical Epidural Steroid injection (CESI) (Interlaminar) #2  Laterality: Midline  Level: C7-T1 Imaging: Fluoroscopy-assisted DOS: 09/06/2023  Performed by: Wallie Sherry, MD Anesthesia: Local anesthesia (1-2% Lidocaine ) Sedation:                            Purpose: Diagnostic/Therapeutic Indications: Cervicalgia, cervical radicular pain, degenerative disc disease, severe enough to impact quality of life or function. 1. Cervical radicular pain   2. Cervicalgia   3. Chronic pain syndrome     NAS-11 score:              Pre-procedure: 8 /10              Post-procedure: 8 /10   Effectiveness:  Initial hour after procedure: 100 % . Subsequent 4-6 hours post-procedure: 100 % . Analgesia past initial 6 hours: 50 % . Ongoing improvement:  Analgesic:  Tony Cardenas received diagnostic/therapeutic cervical epidural steroid (interlaminar) injection on September 06, 2023.  He reports initially 100% pain relief and functional improvement during local anesthetic phase, followed sustained ongoing 50% pain relief since the procedure.  However, he continues to experience intermittent pain, but it is not consistent or persistent following the procedure. Function: Tony Cardenas reports improvement in function ROM: Tony Cardenas reports improvement in ROM   Pharmacotherapy Assessment   Hydrocodone -acetaminophen  (Norco) 7.5-325 mg tablet every 6 hours as needed for moderate pain. MME=30 Monitoring: [place] PMP: PDMP reviewed during this encounter.       Pharmacotherapy: No side-effects or adverse reactions reported. Compliance: No problems identified. Effectiveness: Clinically acceptable.  Margrette Nathanel PARAS, RN  11/09/2023  8:15 AM  Sign when Signing Visit Safety precautions to be maintained throughout the  outpatient stay will include: orient to surroundings, keep bed in low position, maintain call bell within reach at all times, provide assistance with transfer out of bed and ambulation.   Nursing Pain Medication Assessment:  Safety precautions to be maintained throughout the outpatient stay will include: orient to surroundings, keep bed in low position, maintain call bell within reach at all times, provide assistance with transfer out of bed and ambulation.  Medication Inspection Compliance: Pill count conducted under aseptic conditions, in front of the patient. Neither the pills nor the bottle was removed from the patient's sight at any time. Once count was completed pills were immediately returned to the patient in their original bottle.  Medication: Hydrocodone /APAP Pill/Patch Count: 11 of 120 pills/patches remain Pill/Patch Appearance: Markings consistent with prescribed medication Bottle Appearance: Standard pharmacy container. Clearly labeled. Filled Date: 78 / 2 / 2025 Last Medication intake:  Today    UDS:  Summary  Date Value Ref Range Status  02/14/2023 FINAL  Final    Comment:    ==================================================================== [employer] [facility] 13 (MW) ==================================================================== Test                             Result       Flag       Units  Drug Present and Declared for Prescription Verification   Hydrocodone   786          EXPECTED   ng/mg creat   Hydromorphone                   264          EXPECTED   ng/mg creat   Dihydrocodeine                 82           EXPECTED   ng/mg creat   Norhydrocodone                 542          EXPECTED   ng/mg creat    Sources of hydrocodone  include scheduled prescription medications.    Hydromorphone , dihydrocodeine and norhydrocodone are expected    metabolites of hydrocodone . Hydromorphone  and dihydrocodeine are    also available as scheduled prescription  medications.  Drug Absent but Declared for Prescription Verification   Diazepam                        Not Detected UNEXPECTED ng/mg creat ==================================================================== Test                      Result    Flag   Units      Ref Range   Creatinine              187              mg/dL      >=79 ==================================================================== Declared Medications:  The flagging and interpretation on this report are based on the  following declared medications.  Unexpected results may arise from  inaccuracies in the declared medications.   **Note: The testing scope of this panel includes these medications:   Diazepam  (Valium )  Hydrocodone  (Norco)   **Note: The testing scope of this panel does not include the  following reported medications:   Acetaminophen  (Norco)  Albuterol  (Ventolin  HFA)  Ezetimibe  (Zetia )  Fluticasone  (Flonase )  Folic Acid  (Folvite )  Glipizide  (Glucotrol )  Hydroxychloroquine  (Plaquenil )  Lisinopril  (Zestril )  Metformin  (Glucophage )  Methocarbamol  (Robaxin )  Methotrexate   Omega-3-Acid  Ethyl Esters (Lovaza )  Prednisone  (Deltasone )  Vitamin D2 (Drisdol ) ==================================================================== For clinical consultation, please call [phone]. ====================================================================     No results found for: CBDTHCR No results found for: D8THCCBX No results found for: D9THCCBX  ROS  Constitutional: Denies any fever or chills Gastrointestinal: No reported hemesis, hematochezia, vomiting, or acute GI distress Musculoskeletal: Neck pain Neurological: No reported episodes of acute onset apraxia, aphasia, dysarthria, agnosia, amnesia, paralysis, loss of coordination, or loss of consciousness  Medication Review  FreeStyle Freedom Lite, FreeStyle Libre 3 Plus Sensor, HYDROcodone -acetaminophen , Insulin  Pen Needle, albuterol , ezetimibe ,  fenofibrate , fluticasone , folic acid , freestyle, glipiZIDE , glucose blood, hydroxychloroquine , insulin  degludec, lisinopril , metFORMIN , methocarbamol , methotrexate , omega-3 acid ethyl esters, and rosuvastatin   History Review  Allergy: Tony Cardenas is allergic to atorvastatin , hydromorphone  hcl, and semaglutide . Drug: Tony Cardenas  reports no history of drug use. Alcohol:  reports no history of alcohol use. Tobacco:  reports that he quit smoking about 4 years ago. His smoking use included cigarettes. He started smoking about 24 years ago. He has a 61 pack-year smoking history. He has been exposed to tobacco smoke. He quit smokeless tobacco use about 7 years ago.  His smokeless tobacco use included chew. Social: Tony Cardenas  reports that he quit smoking about 4 years ago. His smoking use included cigarettes. He  started smoking about 24 years ago. He has a 61 pack-year smoking history. He has been exposed to tobacco smoke. He quit smokeless tobacco use about 7 years ago.  His smokeless tobacco use included chew. He reports that he does not drink alcohol and does not use drugs. Medical:  has a past medical history of Arthritis, COPD (chronic obstructive pulmonary disease) (HCC), Diabetes mellitus without complication (HCC), GERD (gastroesophageal reflux disease), Hyperlipidemia, Hypertension, and Neck pain (1989). Surgical: Tony Cardenas  has a past surgical history that includes Neck surgery (1989); Knee surgery (Right); Colonoscopy (N/A, 06/02/2014); Polypectomy (06/02/2014); Appendectomy; Esophagogastroduodenoscopy (egd) with propofol  (N/A, 07/29/2016); Back surgery; Anterior cervical corpectomy (N/A, 10/10/2018); Hernia repair (over 10 years ago); Anterior cervical corpectomy (N/A, 05/01/2019); Posterior cervical fusion/foraminotomy (N/A, 05/01/2019); Colonoscopy with propofol  (N/A, 11/15/2021); Spine surgery; and Cholecystectomy. Family: family history includes Alcohol abuse in his father; COPD in his  father; Cancer in his mother; Diabetes in his brother, father, and paternal grandmother.  Laboratory Chemistry Profile   Renal Lab Results  Component Value Date   BUN 10 09/27/2023   CREATININE 0.72 (L) 09/27/2023   BCR 14 09/27/2023   GFRAA 114 12/16/2019   GFRNONAA >60 11/11/2021    Hepatic Lab Results  Component Value Date   AST 11 09/27/2023   ALT 14 09/27/2023   ALBUMIN 4.6 09/27/2023   ALKPHOS 54 09/27/2023   HCVAB NON REACTIVE 03/24/2020    Electrolytes Lab Results  Component Value Date   NA 139 09/27/2023   K 4.5 09/27/2023   CL 105 09/27/2023   CALCIUM  9.1 09/27/2023    Bone Lab Results  Component Value Date   VD25OH 39.9 09/27/2023    Inflammation (CRP: Acute Phase) (ESR: Chronic Phase) Lab Results  Component Value Date   CRP 7.3 06/23/2023   ESRSEDRATE 9 06/23/2023         Note: Above Lab results reviewed.  Recent Imaging Review  CT CHEST LUNG CA SCREEN LOW DOSE W/O CM CLINICAL DATA:  Lung cancer screening. Former smoker, quit in 2021, 96 pack-year history.  EXAM: CT CHEST WITHOUT CONTRAST LOW-DOSE FOR LUNG CANCER SCREENING  TECHNIQUE: Multidetector CT imaging of the chest was performed following the standard protocol without IV contrast.  RADIATION DOSE REDUCTION: This exam was performed according to the departmental dose-optimization program which includes automated exposure control, adjustment of the mA and/or kV according to patient size and/or use of iterative reconstruction technique.  COMPARISON:  None Available.  FINDINGS: Cardiovascular: The heart is normal in size. No pericardial effusion. The thoracic aorta is normal in caliber, no aneurysm.  Mediastinum/Nodes: Small mediastinal lymph nodes are not enlarged by size criteria. No bulky hilar adenopathy, detailed hilar assessment is limited in the absence of IV contrast. Decompressed esophagus.  Lungs/Pleura: Minimal emphysema with central bronchial thickening. No consolidative  airspace disease. Subsegmental areas of bandlike scarring or atelectasis in the right lower lobe, right middle lobe and lingula. 7.1 mm mean diameter subpleural right upper lobe nodule, series 3, image 77.  Upper Abdomen: Cholecystectomy. Scattered subcentimeter hypodensities in the liver are too small to characterize, but typically small cysts or hemangiomas.  Musculoskeletal: There are no acute or suspicious osseous abnormalities.  IMPRESSION: 1. Lung-RADS 3, probably benign findings. Short-term follow-up in 6 months is recommended with repeat low-dose chest CT without contrast (please use the following order, CT CHEST LCS NODULE FOLLOW-UP W/O CM). Subpleural right upper lobe nodule mid mean diameter 7.1 mm. 2.  Emphysema (ICD10-J43.9).  Electronically Signed   By: Andrea  Sanford M.D.   On: 10/25/2023 17:15 Note: Reviewed        Physical Exam  Vitals: BP 125/68   Pulse 83   Temp 98.2 F (36.8 C)   Ht 5' 11 (1.803 m)   Wt 240 lb (108.9 kg)   SpO2 97%   BMI 33.47 kg/m  BMI: Estimated body mass index is 33.47 kg/m as calculated from the following:   Height as of this encounter: 5' 11 (1.803 m).   Weight as of this encounter: 240 lb (108.9 kg). Ideal: Ideal body weight: 75.3 kg (166 lb 0.1 oz) Adjusted ideal body weight: 88.7 kg (195 lb 9.7 oz) General appearance: Well nourished, well developed, and well hydrated. In no apparent acute distress Mental status: Alert, oriented x 3 (person, place, & time)       Respiratory: No evidence of acute respiratory distress Eyes: PERLA  Musculoskeletal: Neck pain Cervical Spine Exam  Skin & Axial Inspection: No masses, redness, edema, swelling, or associated skin lesions Alignment: Symmetrical Functional ROM: Pain restricted ROM, bilaterally Stability: No instability detected Muscle Tone/Strength: Functionally intact. No obvious neuro-muscular anomalies detected. Sensory (Neurological): Neurogenic pain pattern Palpation: No  palpable anomalies                         Upper Extremity (UE) Exam          Right   Left  Inspection       Skin color, temperature, and hair growth are WNL. No peripheral edema or cyanosis. No masses, redness, swelling, asymmetry, or associated skin lesions. No contractures.   Skin color, temperature, and hair growth are WNL. No peripheral edema or cyanosis. No masses, redness, swelling, asymmetry, or associated skin lesions. No contractures.                 Functional ROM       Unrestricted ROM           Unrestricted ROM                         Muscle Tone/Strength       Functionally intact. No obvious neuro-muscular anomalies detected.   Functionally intact. No obvious neuro-muscular anomalies detected.                 Sensory (Neurological)       Unimpaired           Unimpaired                         Palpation       No palpable anomalies               No palpable anomalies                             Maneuver Shoulder abduction (deltoid/supraspinatus, axillary/supra scapular n,, C5) Elbow flexion (biceps brachial, musculoskeletal n, C5-6) Elbow extension (triceps, radial n, C7) Finger abduction (interossei, ulnar n, T1)       Shoulder abduction (deltoid/supraspinatus, axillary/supra scapular n,, C5) Elbow flexion (biceps brachial, musculoskeletal n, C5-6) Elbow extension (triceps, radial n, C7) Wrist extensors (C6) Finger extensors (C8) Finger abduction (interossei, ulnar n, T1)                     Provocative Test       Phalen's test: deferred Tinel's  test: deferred Apley's scratch test (touch opposite shoulder):  Action 1 (Across chest): deferred Action 2 (Overhead): deferred Action 3 (LB reach): deferred   Phalen's test: deferred Tinel's test: deferred Apley's scratch test (touch opposite shoulder):  Action 1 (Across chest): deferred Action 2 (Overhead): deferred Action 3 (LB reach): deferred                       Level   Myotome   Dermatome   Sclerotome   ROM  C5    Elbow flexion   Lateral upper arm          C6   Wrist extension   Thumb and index          C7   Elbow extension   Middle finger          C8   Finger extension   Ring and pinky finger          T1   Finger abduction   Medial elbow and axilla           Assessment   Diagnosis Status  1. Cervical radicular pain   2. Cervicalgia   3. Chronic pain syndrome   4. Medication management   5. Cervical fusion syndrome   6. Chronic inflammatory arthritis    Improved Improved Stable   Updated Problems: No problems updated.  Plan of Care  Problem-specific:  Assessment and Plan    Cervical radicular pain: Cervical radiculopathy is often caused by degenerative (wear and tear) changes in cervical spine, often contribute narrow the foraminal. His last MRI showed moderate bilateral foraminal narrowing at C3-C4, C4-C5 (R>L), and C6-C7 with mild bilateral facet arthropathy at C7-T1.  The patient experiences about 50% reduction in pain with functional improvement and better range of motion in the neck and shoulder area after the (C-ESI) procedure.   Cervicalgia: The patient experiences about 50% reduction in pain with functional improvement and better range of motion in the neck and shoulder area after the (C-ESI) procedure.   Chronic pain syndrome: Patient's pain is well controlled with hydrocodone , will continue on current medication regimen. Prescribing drug monitoring (PDMP) reviewed; findings consistent with the use of prescribed medication and no evidence of narcotic misuse or abuse.  Urine drug screening (UDS) up-to-date and consistent with the use of prescribe medication. The patient was advised to drink more water  to reduce or prevent from opioid induced constipation. Schedule follow-up in 90 days for medication management.      Tony Cardenas has a current medication list which includes the following long-term medication(s): albuterol , ezetimibe , fenofibrate , fluticasone , glipizide ,  lisinopril , metformin , omega-3 acid ethyl esters, and rosuvastatin .  Pharmacotherapy (Medications Ordered): Meds ordered this encounter  Medications   HYDROcodone -acetaminophen  (NORCO) 7.5-325 MG tablet    Sig: Take 1 tablet by mouth every 6 (six) hours as needed for moderate pain (pain score 4-6).    Dispense:  120 tablet    Refill:  0    May fill one day early if pharmacy being closed on Saturday.   HYDROcodone -acetaminophen  (NORCO) 7.5-325 MG tablet    Sig: Take 1 tablet by mouth every 6 (six) hours as needed for moderate pain (pain score 4-6).    Dispense:  120 tablet    Refill:  0    May fill one day early if the pharmacy close on Saturday.   HYDROcodone -acetaminophen  (NORCO) 7.5-325 MG tablet    Sig: Take 1 tablet by mouth every 6 (six) hours as  needed for moderate pain (pain score 4-6).    Dispense:  120 tablet    Refill:  0    Per [facility] Charm- Dr Marcelino is okay with [facility] fill med on Friday 04/14/23 due to pharmacy being closed on Saturday.   Orders:  No orders of the defined types were placed in this encounter.       Return in about 3 months (around 02/09/2024) for (F2F), (MM), Emmy Blanch NP.    Recent Visits Date Type Provider Dept  09/06/23 Procedure visit Marcelino Nurse, MD [facility]  Showing recent visits within past 90 days and meeting all other requirements Today's Visits Date Type Provider Dept  11/09/23 Office Visit Caroline Matters K, NP [facility]  Showing today's visits and meeting all other requirements Future Appointments Date Type Provider Dept  01/29/24 Appointment Salena Ortlieb K, NP [facility]  Showing future appointments within next 90 days and meeting all other requirements  I discussed the assessment and treatment plan with the patient. The patient was provided an opportunity to ask questions and all were answered. The patient agreed with the plan and demonstrated an understanding of the instructions.  Patient advised to  call back or seek an in-person evaluation if the symptoms or condition worsens.  I personally spent a total of 30 minutes in the care of the patient today including preparing to see the patient, getting/reviewing separately obtained history, performing a medically appropriate exam/evaluation, counseling and educating, placing orders, referring and communicating with other health care professionals, documenting clinical information in the EHR, independently interpreting results, communicating results, and coordinating care.   Note by: Evelise Reine K Zach Tietje, NP (TTS and AI technology used. I apologize for any typographical errors that were not detected and corrected.) Date: 11/09/2023; Time: 9:24 AM

## 2023-11-09 NOTE — Progress Notes (Signed)
 Safety precautions to be maintained throughout the outpatient stay will include: orient to surroundings, keep bed in low position, maintain call bell within reach at all times, provide assistance with transfer out of bed and ambulation.   Nursing Pain Medication Assessment:  Safety precautions to be maintained throughout the outpatient stay will include: orient to surroundings, keep bed in low position, maintain call bell within reach at all times, provide assistance with transfer out of bed and ambulation.  Medication Inspection Compliance: Pill count conducted under aseptic conditions, in front of the patient. Neither the pills nor the bottle was removed from the patient's sight at any time. Once count was completed pills were immediately returned to the patient in their original bottle.  Medication: Hydrocodone /APAP Pill/Patch Count: 11 of 120 pills/patches remain Pill/Patch Appearance: Markings consistent with prescribed medication Bottle Appearance: Standard pharmacy container. Clearly labeled. Filled Date: 35 / 2 / 2025 Last Medication intake:  Today

## 2023-11-10 ENCOUNTER — Other Ambulatory Visit: Payer: Self-pay

## 2023-11-11 ENCOUNTER — Other Ambulatory Visit: Payer: Self-pay

## 2023-11-13 ENCOUNTER — Encounter: Payer: Self-pay | Admitting: Radiology

## 2023-11-18 ENCOUNTER — Other Ambulatory Visit: Payer: Self-pay

## 2023-11-18 ENCOUNTER — Other Ambulatory Visit: Payer: Self-pay | Admitting: Family Medicine

## 2023-11-20 ENCOUNTER — Other Ambulatory Visit: Payer: Self-pay | Admitting: Family Medicine

## 2023-11-20 ENCOUNTER — Other Ambulatory Visit: Payer: Self-pay

## 2023-11-21 ENCOUNTER — Other Ambulatory Visit: Payer: Self-pay

## 2023-11-21 MED FILL — Omega-3-acid Ethyl Esters Cap 1 GM: ORAL | 90 days supply | Qty: 90 | Fill #0 | Status: AC

## 2023-11-21 NOTE — Telephone Encounter (Signed)
 Requested Prescriptions  Pending Prescriptions Disp Refills   omega-3 acid ethyl esters (LOVAZA ) 1 g capsule 90 capsule 3    Sig: Take 1 capsule (1 g total) by mouth daily.     Endocrinology:  Nutritional Agents - omega-3 acid ethyl esters Failed - 11/21/2023  3:52 PM      Failed - Lipid Panel in normal range within the last 12 months    Cholesterol, Total  Date Value Ref Range Status  09/27/2023 145 100 - 199 mg/dL Final   Cholesterol [place], [place]  Date Value Ref Range Status  06/22/2015 WILL FOLLOW  Preliminary   LDL Chol Calc (NIH)  Date Value Ref Range Status  09/27/2023 72 0 - 99 mg/dL Final   HDL  Date Value Ref Range Status  09/27/2023 30 (L) >39 mg/dL Final   Triglycerides  Date Value Ref Range Status  09/27/2023 266 (H) 0 - 149 mg/dL Final   Triglycerides Piccolo,Waived  Date Value Ref Range Status  06/22/2015 WILL FOLLOW  Preliminary         Passed - Valid encounter within last 12 months    Recent Outpatient Visits           1 month ago COPD exacerbation (HCC)   [facility] Herold Hadassah SQUIBB, MD   1 month ago Routine general medical examination at a health care facility   [facility] [place] P, DO   8 months ago Type 2 diabetes mellitus with hyperglycemia, without long-term current use of insulin  [facility])   [facility] Vicci Duwaine SQUIBB, DO       Future Appointments             In 1 month Rice, Lonni ORN, MD [facility] - A Dept Of [place]. [facility]   In 5 months Rice, Lonni ORN, MD [facility] - A Dept Of [place]. [facility]

## 2023-11-29 ENCOUNTER — Other Ambulatory Visit: Payer: Self-pay | Admitting: Nurse Practitioner

## 2023-11-29 ENCOUNTER — Other Ambulatory Visit: Payer: Self-pay

## 2023-11-29 MED ORDER — FREESTYLE LIBRE 3 PLUS SENSOR MISC
0 refills | Status: AC
  Filled 2023-11-29: qty 6, 90d supply, fill #0

## 2023-12-11 ENCOUNTER — Other Ambulatory Visit: Payer: Self-pay

## 2023-12-20 ENCOUNTER — Other Ambulatory Visit: Payer: Self-pay

## 2023-12-20 ENCOUNTER — Ambulatory Visit: Admitting: Nurse Practitioner

## 2023-12-20 ENCOUNTER — Encounter: Payer: Self-pay | Admitting: Nurse Practitioner

## 2023-12-20 VITALS — BP 122/70 | HR 67 | Ht 71.0 in | Wt 246.0 lb

## 2023-12-20 DIAGNOSIS — I10 Essential (primary) hypertension: Secondary | ICD-10-CM

## 2023-12-20 DIAGNOSIS — Z7984 Long term (current) use of oral hypoglycemic drugs: Secondary | ICD-10-CM

## 2023-12-20 DIAGNOSIS — E119 Type 2 diabetes mellitus without complications: Secondary | ICD-10-CM | POA: Diagnosis not present

## 2023-12-20 DIAGNOSIS — E559 Vitamin D deficiency, unspecified: Secondary | ICD-10-CM | POA: Diagnosis not present

## 2023-12-20 DIAGNOSIS — E782 Mixed hyperlipidemia: Secondary | ICD-10-CM

## 2023-12-20 LAB — POCT GLYCOSYLATED HEMOGLOBIN (HGB A1C): Hemoglobin A1C: 7.3 % — AB (ref 4.0–5.6)

## 2023-12-20 MED ORDER — METFORMIN HCL ER 500 MG PO TB24
500.0000 mg | ORAL_TABLET | Freq: Every day | ORAL | 3 refills | Status: AC
  Filled 2023-12-20: qty 90, 90d supply, fill #0

## 2023-12-20 MED ORDER — GLIPIZIDE ER 5 MG PO TB24
5.0000 mg | ORAL_TABLET | Freq: Every day | ORAL | 3 refills | Status: AC
  Filled 2023-12-20: qty 90, 90d supply, fill #0
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
## 2023-12-21 ENCOUNTER — Other Ambulatory Visit: Payer: Self-pay | Admitting: Student in an Organized Health Care Education/Training Program

## 2023-12-21 ENCOUNTER — Other Ambulatory Visit: Payer: Self-pay

## 2023-12-22 ENCOUNTER — Other Ambulatory Visit: Payer: Self-pay

## 2023-12-23 ENCOUNTER — Other Ambulatory Visit: Payer: Self-pay

## 2023-12-24 ENCOUNTER — Other Ambulatory Visit: Payer: Self-pay

## 2023-12-25 ENCOUNTER — Other Ambulatory Visit: Payer: Self-pay

## 2023-12-26 ENCOUNTER — Other Ambulatory Visit: Payer: Self-pay

## 2023-12-27 ENCOUNTER — Other Ambulatory Visit: Payer: Self-pay

## 2023-12-28 ENCOUNTER — Other Ambulatory Visit: Payer: Self-pay

## 2023-12-29 ENCOUNTER — Other Ambulatory Visit: Payer: Self-pay
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
## 2024-01-05 ENCOUNTER — Other Ambulatory Visit: Payer: Self-pay

## 2024-01-08 ENCOUNTER — Other Ambulatory Visit: Payer: Self-pay

## 2024-01-09 ENCOUNTER — Other Ambulatory Visit: Payer: Self-pay

## 2024-01-10 ENCOUNTER — Other Ambulatory Visit: Payer: Self-pay

## 2024-01-15 ENCOUNTER — Other Ambulatory Visit: Payer: Self-pay

## 2024-01-15 ENCOUNTER — Encounter: Payer: Self-pay | Admitting: Internal Medicine

## 2024-01-15 ENCOUNTER — Ambulatory Visit: Attending: Internal Medicine | Admitting: Internal Medicine

## 2024-01-15 VITALS — BP 118/59 | HR 79 | Temp 98.0°F | Resp 16 | Ht 71.0 in | Wt 242.2 lb

## 2024-01-15 DIAGNOSIS — Z79899 Other long term (current) drug therapy: Secondary | ICD-10-CM

## 2024-01-15 DIAGNOSIS — M0609 Rheumatoid arthritis without rheumatoid factor, multiple sites: Secondary | ICD-10-CM | POA: Diagnosis not present

## 2024-01-15 DIAGNOSIS — M542 Cervicalgia: Secondary | ICD-10-CM

## 2024-01-15 MED ORDER — METHOTREXATE SODIUM 2.5 MG PO TABS
15.0000 mg | ORAL_TABLET | ORAL | 0 refills | Status: AC
  Filled 2024-01-15: qty 78, 91d supply, fill #0

## 2024-01-15 MED ORDER — HYDROXYCHLOROQUINE SULFATE 200 MG PO TABS
400.0000 mg | ORAL_TABLET | Freq: Every day | ORAL | 1 refills | Status: AC
  Filled 2024-01-15: qty 180, 90d supply, fill #0
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
## 2024-01-16 LAB — CBC WITH DIFFERENTIAL/PLATELET
Absolute Lymphocytes: 3049 {cells}/uL (ref 850–3900)
Absolute Monocytes: 630 {cells}/uL (ref 200–950)
Basophils Absolute: 113 {cells}/uL (ref 0–200)
Basophils Relative: 0.9 %
Eosinophils Absolute: 214 {cells}/uL (ref 15–500)
Eosinophils Relative: 1.7 %
HCT: 44.3 % (ref 39.4–51.1)
Hemoglobin: 14.8 g/dL (ref 13.2–17.1)
MCH: 31.1 pg (ref 27.0–33.0)
MCHC: 33.4 g/dL (ref 31.6–35.4)
MCV: 93.1 fL (ref 81.4–101.7)
MPV: 9.1 fL (ref 7.5–12.5)
Monocytes Relative: 5 %
Neutro Abs: 8593 {cells}/uL — ABNORMAL HIGH (ref 1500–7800)
Neutrophils Relative %: 68.2 %
Platelets: 447 Thousand/uL — ABNORMAL HIGH (ref 140–400)
RBC: 4.76 Million/uL (ref 4.20–5.80)
RDW: 12.5 % (ref 11.0–15.0)
Total Lymphocyte: 24.2 %
WBC: 12.6 Thousand/uL — ABNORMAL HIGH (ref 3.8–10.8)

## 2024-01-16 LAB — COMPREHENSIVE METABOLIC PANEL WITH GFR
AG Ratio: 2.3 (calc) (ref 1.0–2.5)
ALT: 13 U/L (ref 9–46)
AST: 12 U/L (ref 10–35)
Albumin: 4.6 g/dL (ref 3.6–5.1)
Alkaline phosphatase (APISO): 54 U/L (ref 35–144)
BUN: 14 mg/dL (ref 7–25)
CO2: 23 mmol/L (ref 20–32)
Calcium: 9.1 mg/dL (ref 8.6–10.3)
Chloride: 104 mmol/L (ref 98–110)
Creat: 1.03 mg/dL (ref 0.70–1.30)
Globulin: 2 g/dL (ref 1.9–3.7)
Glucose, Bld: 281 mg/dL — ABNORMAL HIGH (ref 65–99)
Potassium: 4.5 mmol/L (ref 3.5–5.3)
Sodium: 139 mmol/L (ref 135–146)
Total Bilirubin: 0.2 mg/dL (ref 0.2–1.2)
Total Protein: 6.6 g/dL (ref 6.1–8.1)
eGFR: 86 mL/min/1.73m2

## 2024-01-16 LAB — SEDIMENTATION RATE: Sed Rate: 6 mm/h (ref 0–20)

## 2024-01-18 ENCOUNTER — Other Ambulatory Visit: Payer: Self-pay
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
## 2024-01-29 ENCOUNTER — Ambulatory Visit: Attending: Nurse Practitioner | Admitting: Nurse Practitioner

## 2024-01-29 ENCOUNTER — Encounter: Payer: Self-pay | Admitting: Nurse Practitioner

## 2024-01-29 ENCOUNTER — Other Ambulatory Visit: Payer: Self-pay

## 2024-01-29 VITALS — BP 118/97 | HR 77 | Temp 98.3°F | Resp 20 | Ht 71.0 in | Wt 245.0 lb

## 2024-01-29 DIAGNOSIS — Z981 Arthrodesis status: Secondary | ICD-10-CM | POA: Diagnosis not present

## 2024-01-29 DIAGNOSIS — Z79899 Other long term (current) drug therapy: Secondary | ICD-10-CM | POA: Insufficient documentation

## 2024-01-29 DIAGNOSIS — M0609 Rheumatoid arthritis without rheumatoid factor, multiple sites: Secondary | ICD-10-CM | POA: Diagnosis not present

## 2024-01-29 DIAGNOSIS — M542 Cervicalgia: Secondary | ICD-10-CM | POA: Insufficient documentation

## 2024-01-29 DIAGNOSIS — Q761 Klippel-Feil syndrome: Secondary | ICD-10-CM | POA: Diagnosis not present

## 2024-01-29 DIAGNOSIS — G894 Chronic pain syndrome: Secondary | ICD-10-CM | POA: Insufficient documentation

## 2024-01-29 DIAGNOSIS — M5412 Radiculopathy, cervical region: Secondary | ICD-10-CM | POA: Diagnosis not present

## 2024-01-29 DIAGNOSIS — M1380 Other specified arthritis, unspecified site: Secondary | ICD-10-CM | POA: Diagnosis not present

## 2024-01-29 MED ORDER — HYDROCODONE-ACETAMINOPHEN 7.5-325 MG PO TABS: 1.0000 | ORAL_TABLET | Freq: Four times a day (QID) | ORAL | 0 refills | Status: AC | PRN

## 2024-01-29 MED ORDER — METHOCARBAMOL 500 MG PO TABS
500.0000 mg | ORAL_TABLET | Freq: Three times a day (TID) | ORAL | 5 refills | Status: AC | PRN
  Filled 2024-01-29: qty 90, 30d supply, fill #0

## 2024-01-29 MED ORDER — HYDROCODONE-ACETAMINOPHEN 7.5-325 MG PO TABS
1.0000 | ORAL_TABLET | Freq: Four times a day (QID) | ORAL | 0 refills | Status: AC | PRN
  Filled 2024-02-09: qty 120, 30d supply, fill #0

## 2024-01-29 NOTE — Progress Notes (Signed)
 Nursing Pain Medication Assessment:  Safety precautions to be maintained throughout the outpatient stay will include: orient to surroundings, keep bed in low position, maintain call bell within reach at all times, provide assistance with transfer out of bed and ambulation.  Medication Inspection Compliance: Pill count conducted under aseptic conditions, in front of the patient. Neither the pills nor the bottle was removed from the patient's sight at any time. Once count was completed pills were immediately returned to the patient in their original bottle.  Medication: Hydrocodone /APAP Pill/Patch Count: 43 of 120 pills/patches remain Pill/Patch Appearance: Markings consistent with prescribed medication Bottle Appearance: Standard pharmacy container. Clearly labeled. Filled Date: 52 / 67 / 2025 Last Medication intake:  Today

## 2024-01-29 NOTE — Patient Instructions (Signed)

## 2024-02-01 LAB — TOXASSURE SELECT 13 (MW), URINE

## 2024-02-06 ENCOUNTER — Telehealth: Payer: Self-pay | Admitting: Family Medicine

## 2024-02-06 NOTE — Telephone Encounter (Unsigned)
 Copied from CRM #[id number]. Topic: General - Other >> Feb 02, 2024  3:55 PM Avram MATSU wrote: Reason for CRM: [facility] will be faxing over a form for change of medication >> Feb 05, 2024 12:22 PM Avram MATSU wrote: Called to check if the fax was received

## 2024-02-07 ENCOUNTER — Other Ambulatory Visit: Payer: Self-pay

## 2024-02-09 ENCOUNTER — Other Ambulatory Visit: Payer: Self-pay

## 2024-02-09 NOTE — Telephone Encounter (Signed)
 No form received yet.

## 2024-03-27 ENCOUNTER — Ambulatory Visit: Admitting: Family Medicine

## 2024-04-16 ENCOUNTER — Ambulatory Visit: Admitting: Internal Medicine

## 2024-04-19 ENCOUNTER — Ambulatory Visit: Admitting: Nurse Practitioner

## 2024-04-22 ENCOUNTER — Encounter: Admitting: Nurse Practitioner

## 2024-04-23 ENCOUNTER — Ambulatory Visit

## 2024-04-24 ENCOUNTER — Ambulatory Visit: Admitting: Internal Medicine

## 2024-06-25 ENCOUNTER — Ambulatory Visit
